# Patient Record
Sex: Female | Born: 1955 | Race: White | Hispanic: No | Marital: Single | Smoking: Former smoker
Health system: Southern US, Community
[De-identification: ages and names within clinical notes are randomized; demographics above are authoritative.]

## PROBLEM LIST (undated history)

## (undated) DIAGNOSIS — J449 Chronic obstructive pulmonary disease, unspecified: Secondary | ICD-10-CM

## (undated) DIAGNOSIS — J189 Pneumonia, unspecified organism: Secondary | ICD-10-CM

## (undated) DIAGNOSIS — M1990 Unspecified osteoarthritis, unspecified site: Secondary | ICD-10-CM

## (undated) DIAGNOSIS — E785 Hyperlipidemia, unspecified: Secondary | ICD-10-CM

## (undated) DIAGNOSIS — K7460 Unspecified cirrhosis of liver: Secondary | ICD-10-CM

## (undated) DIAGNOSIS — E119 Type 2 diabetes mellitus without complications: Secondary | ICD-10-CM

## (undated) DIAGNOSIS — C3491 Malignant neoplasm of unspecified part of right bronchus or lung: Secondary | ICD-10-CM

## (undated) DIAGNOSIS — Z9289 Personal history of other medical treatment: Secondary | ICD-10-CM

## (undated) DIAGNOSIS — Z8719 Personal history of other diseases of the digestive system: Secondary | ICD-10-CM

## (undated) DIAGNOSIS — J45909 Unspecified asthma, uncomplicated: Secondary | ICD-10-CM

## (undated) DIAGNOSIS — R0600 Dyspnea, unspecified: Secondary | ICD-10-CM

## (undated) DIAGNOSIS — I8500 Esophageal varices without bleeding: Secondary | ICD-10-CM

## (undated) DIAGNOSIS — I10 Essential (primary) hypertension: Secondary | ICD-10-CM

## (undated) HISTORY — PX: ESOPHAGOGASTRODUODENOSCOPY: SHX1529

## (undated) HISTORY — DX: Type 2 diabetes mellitus without complications: E11.9

## (undated) HISTORY — PX: ANTERIOR CRUCIATE LIGAMENT REPAIR: SHX115

## (undated) HISTORY — DX: Hyperlipidemia, unspecified: E78.5

## (undated) HISTORY — PX: ABDOMINAL HYSTERECTOMY: SHX81

---
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
## 2015-01-19 ENCOUNTER — Ambulatory Visit (INDEPENDENT_AMBULATORY_CARE_PROVIDER_SITE_OTHER): Payer: Self-pay | Admitting: Family Medicine

## 2015-01-19 ENCOUNTER — Encounter: Payer: Self-pay | Admitting: Family Medicine

## 2015-01-19 VITALS — BP 138/72 | HR 77 | Temp 97.8°F | Resp 17 | Ht 64.0 in | Wt 166.0 lb

## 2015-01-19 DIAGNOSIS — J0100 Acute maxillary sinusitis, unspecified: Secondary | ICD-10-CM

## 2015-01-19 MED ORDER — AMOXICILLIN 500 MG PO CAPS: 1000.0000 mg | ORAL_CAPSULE | Freq: Two times a day (BID) | ORAL | Status: DC

## 2015-01-19 NOTE — Patient Instructions (Addendum)
1.  Afrin --- 2 sprays each nostril twice daily; stop using after five days. 2.  Mucinex DM-- 1 tablet twice daily for cough and congestion.  Sinusitis Sinusitis is redness, soreness, and inflammation of the paranasal sinuses. Paranasal sinuses are air pockets within the bones of your face (beneath the eyes, the middle of the forehead, or above the eyes). In healthy paranasal sinuses, mucus is able to drain out, and air is able to circulate through them by way of your nose. However, when your paranasal sinuses are inflamed, mucus and air can become trapped. This can allow bacteria and other germs to grow and cause infection. Sinusitis can develop quickly and last only a short time (acute) or continue over a long period (chronic). Sinusitis that lasts for more than 12 weeks is considered chronic.  CAUSES  Causes of sinusitis include:  Allergies.  Structural abnormalities, such as displacement of the cartilage that separates your nostrils (deviated septum), which can decrease the air flow through your nose and sinuses and affect sinus drainage.  Functional abnormalities, such as when the small hairs (cilia) that line your sinuses and help remove mucus do not work properly or are not present. SIGNS AND SYMPTOMS  Symptoms of acute and chronic sinusitis are the same. The primary symptoms are pain and pressure around the affected sinuses. Other symptoms include:  Upper toothache.  Earache.  Headache.  Bad breath.  Decreased sense of smell and taste.  A cough, which worsens when you are lying flat.  Fatigue.  Fever.  Thick drainage from your nose, which often is green and may contain pus (purulent).  Swelling and warmth over the affected sinuses. DIAGNOSIS  Your health care provider will perform a physical exam. During the exam, your health care provider may:  Look in your nose for signs of abnormal growths in your nostrils (nasal polyps).  Tap over the affected sinus to check for  signs of infection.  View the inside of your sinuses (endoscopy) using an imaging device that has a light attached (endoscope). If your health care provider suspects that you have chronic sinusitis, one or more of the following tests may be recommended:  Allergy tests.  Nasal culture. A sample of mucus is taken from your nose, sent to a lab, and screened for bacteria.  Nasal cytology. A sample of mucus is taken from your nose and examined by your health care provider to determine if your sinusitis is related to an allergy. TREATMENT  Most cases of acute sinusitis are related to a viral infection and will resolve on their own within 10 days. Sometimes medicines are prescribed to help relieve symptoms (pain medicine, decongestants, nasal steroid sprays, or saline sprays).  However, for sinusitis related to a bacterial infection, your health care provider will prescribe antibiotic medicines. These are medicines that will help kill the bacteria causing the infection.  Rarely, sinusitis is caused by a fungal infection. In theses cases, your health care provider will prescribe antifungal medicine. For some cases of chronic sinusitis, surgery is needed. Generally, these are cases in which sinusitis recurs more than 3 times per year, despite other treatments. HOME CARE INSTRUCTIONS   Drink plenty of water. Water helps thin the mucus so your sinuses can drain more easily.  Use a humidifier.  Inhale steam 3 to 4 times a day (for example, sit in the bathroom with the shower running).  Apply a warm, moist washcloth to your face 3 to 4 times a day, or as directed by  your health care provider.  Use saline nasal sprays to help moisten and clean your sinuses.  Take medicines only as directed by your health care provider.  If you were prescribed either an antibiotic or antifungal medicine, finish it all even if you start to feel better. SEEK IMMEDIATE MEDICAL CARE IF:  You have increasing pain or  severe headaches.  You have nausea, vomiting, or drowsiness.  You have swelling around your face.  You have vision problems.  You have a stiff neck.  You have difficulty breathing. MAKE SURE YOU:   Understand these instructions.  Will watch your condition.  Will get help right away if you are not doing well or get worse. Document Released: 08/08/2005 Document Revised: 12/23/2013 Document Reviewed: 08/23/2011 [facility] Patient Information 2015 [place], [place]. This information is not intended to replace advice given to you by your health care provider. Make sure you discuss any questions you have with your health care provider.

## 2015-01-19 NOTE — Progress Notes (Signed)
Patient ID: Christiane Sistare, female   DOB: 17-Aug-1956, 59 y.o.   MRN: [id number]   Subjective:  This chart was scribed for Reginia Forts, MD by [facility], medical scribe at [facility].The patient was seen in exam room 10 and the patient's care was started at 3:05 PM.   Patient ID: Lucile Shutters, female    DOB: 01-04-1956, 59 y.o.   MRN: [id number]  01/19/2015  Laryngitis; Sore Throat; and URI  HPI HPI Comments: Loreal Schuessler is a 59 y.o. female who presents to [facility] complaining of a productive cough, sore throat, bilateral ear pain, rhinorrhea, sinus congestion, headache, body aches, chills and diaphoresis. Symptoms began four days ago. She is a producing a thick green, yellow and bloody mucous from her cough and nasal discharge. 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 She has taken some tylenol for relief. Pt does no smoke or drink. She denies fever, vomiting, and diarrhea.    Pt suffers with esophageal varices; no alcohol hx.  Review of Systems  Constitutional: Positive for chills and diaphoresis. Negative for fever and fatigue.  HENT: Positive for congestion, ear pain, hearing loss, rhinorrhea, sinus pressure, sneezing, sore throat and voice change. Negative for ear discharge, postnasal drip and trouble swallowing.   Respiratory: Positive for cough. Negative for shortness of breath.   Cardiovascular: Negative for chest pain, palpitations and leg swelling.  Gastrointestinal: Negative for nausea, vomiting, abdominal pain, diarrhea and constipation.  Skin: Negative for rash.  Neurological: Positive for headaches.   History reviewed. No pertinent past medical history. History reviewed. No pertinent past surgical history. No Known Allergies History   Social History  . Marital Status: Single    Spouse Name: N/A  . Number of Children: N/A  . Years of Education: N/A    Occupational History  . Not on file.   Social History Main Topics  . Smoking status: Not on file  . Smokeless tobacco: Not on file  . Alcohol Use: Not on file  . Drug Use: Not on file  . Sexual Activity: Not on file   Other Topics Concern  . Not on file   Social History Narrative  . No narrative on file       Objective:    BP 138/72 mmHg  Pulse 77  Temp(Src) 97.8 F (36.6 C) (Oral)  Resp 17  Ht '5\' 4"'$  (1.626 m)  Wt 166 lb (75.297 kg)  BMI 28.48 kg/m2  SpO2 98% Physical Exam  Constitutional: She is oriented to person, place, and time. She appears well-developed and well-nourished. No distress.  HENT:  Head: Normocephalic and atraumatic.  Right Ear: Tympanic membrane, external ear and ear canal normal.  Left Ear: Tympanic membrane, external ear and ear canal normal.  Nose: Mucosal edema and rhinorrhea present. Right sinus exhibits no maxillary sinus tenderness and no frontal sinus tenderness. Left sinus exhibits no maxillary sinus tenderness and no frontal sinus tenderness.  Mouth/Throat: Oropharynx is clear and moist.  Sinus congestion and tenderness.  Eyes: Conjunctivae and EOM are normal. Pupils are equal, round, and reactive to light.  Neck: Normal range of motion. Neck supple. Carotid bruit is not present. No thyromegaly present.  Cardiovascular: Normal rate, regular rhythm, normal heart sounds and intact distal pulses.  Exam reveals no gallop and no friction rub.   No murmur heard. Pulmonary/Chest: Effort normal and breath sounds normal. She has  no wheezes. She has no rales.  Lymphadenopathy:    She has cervical adenopathy.  Neurological: She is alert and oriented to person, place, and time. No cranial nerve deficit.  Skin: Skin is warm and dry. No rash noted. She is not diaphoretic. No erythema. No pallor.  Psychiatric: She has a normal mood and affect. Her behavior is normal.  Vitals reviewed.  No results found for this or any previous visit.      Assessment & Plan:   1. Acute maxillary sinusitis, recurrence not specified    -New. -Rx for Amoxicillin provided. -Recommend Afrin and Mucinex DM bid. -RTC for acute worsening.   Meds ordered this encounter  Medications  . omeprazole (PRILOSEC) 10 MG capsule    Sig: Take 10 mg by mouth daily.  . nadolol (CORGARD) 20 MG tablet    Sig: Take 20 mg by mouth daily.  Marland Kitchen amoxicillin (AMOXIL) 500 MG capsule    Sig: Take 2 capsules (1,000 mg total) by mouth 2 (two) times daily.    Dispense:  40 capsule    Refill:  0    No Follow-up on file.  I personally performed the services described in this documentation, which was scribed in my presence. The recorded information has been reviewed and considered.  Griffyn Kucinski Elayne Guerin, M.D. [facility] [address] [place], [place]  [address] [phone] phone [phone] fax

## 2015-03-31 ENCOUNTER — Other Ambulatory Visit: Payer: Self-pay | Admitting: Nurse Practitioner

## 2015-03-31 ENCOUNTER — Emergency Department (HOSPITAL_COMMUNITY)
Admission: EM | Admit: 2015-03-31 | Discharge: 2015-03-31 | Disposition: A | Discharge status: Home / Self Care | Payer: Medicaid Other | Source: Home / Self Care | Attending: Emergency Medicine | Admitting: Emergency Medicine

## 2015-03-31 ENCOUNTER — Encounter (HOSPITAL_COMMUNITY): Payer: Self-pay | Admitting: Emergency Medicine

## 2015-03-31 DIAGNOSIS — C220 Liver cell carcinoma: Secondary | ICD-10-CM

## 2015-03-31 DIAGNOSIS — M79631 Pain in right forearm: Secondary | ICD-10-CM | POA: Diagnosis not present

## 2015-03-31 HISTORY — DX: Essential (primary) hypertension: I10

## 2015-03-31 HISTORY — DX: Unspecified cirrhosis of liver: K74.60

## 2015-03-31 HISTORY — DX: Esophageal varices without bleeding: I85.00

## 2015-03-31 MED ORDER — GABAPENTIN 300 MG PO CAPS: 300.0000 mg | ORAL_CAPSULE | Freq: Every day | ORAL | Status: DC

## 2015-03-31 NOTE — ED Notes (Signed)
C/o right arm/wrist pain onset 6 months... Pain is getting worse Denies inj/trauma Alert... No acute distress.

## 2015-03-31 NOTE — ED Provider Notes (Signed)
CSN: [id number]     Arrival date & time 03/31/15  1437 History   First MD Initiated Contact with Patient 03/31/15 1629     Chief Complaint  Patient presents with  . Arm Pain   (Consider location/radiation/quality/duration/timing/severity/associated sxs/prior Treatment) HPI She is a 59 year old woman here for evaluation of right forearm pain. She states this started about 6 months ago and has gradually been getting worse. This started after a prolonged hospitalization. She states at one point she was intubated and had restraints on her arms. She also reports significant bruising after discharge from multiple IVs and blood draws. She reports a poorly defined aching pain in the right forearm. She is unable to pinpoint an exact location. Pain gets significantly worse with rapid supination movements. She is unable to carry heavy objects in that right hand. She requires assistance getting pan out of the oven. She denies any pain with wrist extension and flexion or elbow extension and flexion. She denies any specific injury. She has not noticed any swelling or bruising. She is unable to take NSAIDs due to stomach issues. Narcotic medications make her vomit. She has been taking Tylenol occasionally (does have liver disease) which does temporarily improved the pain.  She has also used ice and heat which again temporarily improve the pain.  Past Medical History  Diagnosis Date  . Esophageal varices   . Cirrhosis   . Hypertension    Past Surgical History  Procedure Laterality Date  . Anterior cruciate ligament repair    . Abdominal hysterectomy     No family history on file. History  Substance Use Topics  . Smoking status: Never Smoker   . Smokeless tobacco: Not on file  . Alcohol Use: No   OB History    No data available     Review of Systems As in history of present illness Allergies  Review of patient's allergies indicates no known allergies.  Home Medications   Prior to Admission  medications   Medication Sig Start Date End Date Taking? Authorizing Provider  nadolol (CORGARD) 20 MG tablet Take 20 mg by mouth daily.   Yes Historical Provider, MD  omeprazole (PRILOSEC) 10 MG capsule Take 10 mg by mouth daily.   Yes Historical Provider, MD  amoxicillin (AMOXIL) 500 MG capsule Take 2 capsules (1,000 mg total) by mouth 2 (two) times daily. 01/19/15   Wardell Honour, MD  gabapentin (NEURONTIN) 300 MG capsule Take 1 capsule (300 mg total) by mouth at bedtime. 03/31/15   Melony Overly, MD   BP 189/75 mmHg  Pulse 67  Temp(Src) 97.8 F (36.6 C) (Oral)  Resp 16  SpO2 100% Physical Exam  Constitutional: She is oriented to person, place, and time. She appears well-developed and well-nourished. No distress.  Cardiovascular: Normal rate.   Pulmonary/Chest: Effort normal.  Musculoskeletal:  Right arm: 2+ radial pulse. Mild swelling on the volar proximal forearm just distal to the medial epicondyle. She has full active range of motion. No point tenderness. She is able to supinate her hand slowly with minimal discomfort. No pain with grip. She has good grip strength without pain.  Neurological: She is alert and oriented to person, place, and time.    ED Course  Procedures (including critical care time) Labs Review Labs Reviewed - No data to display  Imaging Review No results found.   MDM   1. Right forearm pain    Unclear etiology, but appears to involve supinator muscles. We'll do a trial of  gabapentin to try and help with the pain as we are limited with other pain medicines. Recommended follow-up at the sports medicine center for additional evaluation.    Melony Overly, MD 03/31/15 [phone]

## 2015-03-31 NOTE — Discharge Instructions (Signed)
I don't have a great answer for you today, but it seems like you're having trouble with your supinator muscles.  These are the muscles that help you turn your hand over. Try taking gabapentin at bedtime for the next 2 weeks to see if that will help the pain. Continue to use Tylenol judiciously given your liver disease. Alternate ice and heat to the area. Please make an appointment at the sports medicine center for additional evaluation.

## 2015-04-02 NOTE — ED Notes (Signed)
Patient called, requesting we schedule her appoint for her [place]. Spoke w scheduling staff and had earliest appointment of 8-26 @ 9 AM. Patient was called and advised to arrive earlier to complete paperwork

## 2015-04-08 ENCOUNTER — Ambulatory Visit: Payer: Self-pay | Admitting: Physician Assistant

## 2015-04-09 ENCOUNTER — Ambulatory Visit
Admission: RE | Admit: 2015-04-09 | Discharge: 2015-04-09 | Disposition: A | Discharge status: Home / Self Care | Payer: Medicaid Other | Source: Ambulatory Visit | Attending: Nurse Practitioner | Admitting: Nurse Practitioner

## 2015-04-09 DIAGNOSIS — C220 Liver cell carcinoma: Secondary | ICD-10-CM

## 2015-04-17 ENCOUNTER — Ambulatory Visit (INDEPENDENT_AMBULATORY_CARE_PROVIDER_SITE_OTHER): Payer: Medicaid Other | Admitting: Family Medicine

## 2015-04-17 ENCOUNTER — Encounter: Payer: Self-pay | Admitting: Family Medicine

## 2015-04-17 VITALS — BP 154/72 | Ht 64.0 in | Wt 175.0 lb

## 2015-04-17 DIAGNOSIS — M654 Radial styloid tenosynovitis [de Quervain]: Secondary | ICD-10-CM | POA: Diagnosis not present

## 2015-04-17 DIAGNOSIS — M25531 Pain in right wrist: Secondary | ICD-10-CM

## 2015-04-17 MED ORDER — METHYLPREDNISOLONE ACETATE 40 MG/ML IJ SUSP
40.0000 mg | Freq: Once | INTRAMUSCULAR | Status: AC
  Administered 2015-04-17: 40 mg via INTRA_ARTICULAR

## 2015-04-17 NOTE — Progress Notes (Signed)
Patient ID: Sandy [name], female   DOB: 1956-04-01, 59 y.o.   MRN: [id number] [facility]: Attending Note: I have reviewed the chart, discussed wit the Sports Medicine Fellow. I agree with assessment and treatment plan as detailed in the [place] note. Will try CSI

## 2015-04-17 NOTE — Progress Notes (Signed)
  Sandy [name] - 59 y.o. female MRN [id number]  Date of birth: May 07, 1956 Sandy [name] is a 59 y.o. female who presents today for  right wrist pain.   Right wrist pain, initial visit-patient presents today for ongoing right dorsal wrist pain. This is located in the lateral aspect of the dorsal wrist. She is right hand dominant and this is been ongoing now for 6-8 months. She denies inciting event but does note that she has a new grandchild that she has been carrying around. Pain is worse with any type of thumb extension or flexion. It does not radiate into her fingers and she denies any paresthesias. She has not injured this wrist before. She is unable to take NSAIDs due to underlying psoriasis. She has been taking Tylenol 2-3 g with some relief. Rest does improve this.  PMHx - Updated and reviewed.  Contributory factors include: Psoriasis PSHx - Updated and reviewed.  Contributory factors include:  Anterior cruciate ligament repair FHx - Updated and reviewed.  Contributory factors include:  Noncontributory Medications - Neurontin when necessary   ROS Per HPI   Exam:  Filed Vitals:   04/17/15 0833  BP: 154/72   Gen: NAD Cardiorespiratory - Normal respiratory effort/rate.  RRR Wrist: Inspection normal with no visible erythema or swelling. ROM smooth and normal with good flexion and extension and ulnar/radial deviation that is symmetrical with opposite wrist. Palpation is normal over metacarpals, navicular, lunate, and TFCC; tendons without tenderness/ swelling Strength 5/5 in all directions without pain. Positive Finkelstein, Negative tinel's and phalens.  Imaging:  Ultrasound and long and short axis shows tenosynovitis  of the first compartment of the dorsal wrist consistent with de Quervain's. There is anechoic fluid surrounding both tendon sheaths in the first compartment that is focally tender, anechoic, and hyperemic with Doppler.

## 2015-04-17 NOTE — Assessment & Plan Note (Signed)
Patient history and physical consistent with de Quervain's tenosynovium right is of the right dorsal wrist. Differential also includes intersection syndrome as well as a possible ganglion. -Ultrasound does show anechoic fluid surrounding the first compartment tendons. She has a positive Finkelstein's test as well. -Per her report she cannot take NSAIDs due to her underlying psoriasis or she will continue on Tylenol when necessary for pain. -Did recommend a thumb spica splint with interphalangeal joint movement but she was not interested in this today. We did go ahead and perform a 1-1 injection of the first dorsal compartment under ultrasound guidance. She will follow-up in approximately 4-6 weeks to see how she is doing unless she is 100% better.   Aspiration/Injection Procedure Note Sandy [name] [birth year]  Procedure: Injection Indications: Pain   Procedure Details Consent: Risks of procedure as well as the alternatives and risks of each were explained to the (patient/caregiver).  Consent for procedure obtained. Time Out: Verified patient identification, verified procedure, site/side was marked, verified correct patient position, special equipment/implants available, medications/allergies/relevent history reviewed, required imaging and test results available.  Performed.  The area was cleaned with iodine and alcohol swabs.    The R dorsal 1st wrist compartment was injected using 1 cc's of 40 Depomedrol and 1 cc's of 1% lidocaine with a 25 1 1/2" needle.  Ultrasound was used. Images were obtained in Transverse and Long views showing the injection.    A sterile dressing was applied.  Patient did tolerate procedure well. Estimated blood loss: N/A

## 2015-04-21 ENCOUNTER — Telehealth: Payer: Self-pay | Admitting: Gastroenterology

## 2015-04-21 NOTE — Telephone Encounter (Signed)
Received all GI records and placed on Dr. Deatra Ina desk for review.

## 2015-05-08 NOTE — Telephone Encounter (Signed)
Dr. Hilarie Fredrickson declined to accept patient as well.

## 2015-05-08 NOTE — Telephone Encounter (Signed)
Dr. Deatra Ina denied to accept patient. Records placed on Dr. Vena Rua desk for review.

## 2015-06-05 ENCOUNTER — Ambulatory Visit: Payer: Self-pay | Admitting: Gastroenterology

## 2015-09-03 ENCOUNTER — Other Ambulatory Visit: Payer: Self-pay | Admitting: Family Medicine

## 2015-09-03 DIAGNOSIS — Z1231 Encounter for screening mammogram for malignant neoplasm of breast: Secondary | ICD-10-CM

## 2015-09-11 ENCOUNTER — Ambulatory Visit (INDEPENDENT_AMBULATORY_CARE_PROVIDER_SITE_OTHER): Payer: Medicaid Other | Admitting: Family Medicine

## 2015-09-11 ENCOUNTER — Encounter: Payer: Self-pay | Admitting: Family Medicine

## 2015-09-11 VITALS — BP 120/86 | Ht 64.0 in | Wt 184.0 lb

## 2015-09-11 DIAGNOSIS — M25531 Pain in right wrist: Secondary | ICD-10-CM | POA: Diagnosis present

## 2015-09-11 DIAGNOSIS — M79644 Pain in right finger(s): Secondary | ICD-10-CM | POA: Diagnosis not present

## 2015-09-11 MED ORDER — METHYLPREDNISOLONE ACETATE 40 MG/ML IJ SUSP
40.0000 mg | Freq: Once | INTRAMUSCULAR | Status: AC
  Administered 2015-09-11: 40 mg via INTRA_ARTICULAR

## 2015-09-11 NOTE — Progress Notes (Signed)
   Subjective:    Patient ID: Sandy [name], female    DOB: [birth year], 60 y.o.   MRN: [id number]  HPI  follow-up right DQuervains  Tendinitis. At last office visit which was several months ago she had an injection. She's had 90% resolution of her pain up until about the last 3 weeks which started to return. She's also noticed some pain in her thumb joint , also the right side. She is right-hand dominant. In the last 3 weeks her pain has returned almost to the level it was initially area she's having to modify her daily activities   Review of Systems  she's noted no redness or erythema or swelling of the right hand wrist or forearm. She's had no fever, sweats, chills.    Objective:   Physical Exam  vital signs are reviewed GEN.: Well-developed female no acute distress FOREARM: right. Mildly tender to palpation over the thumb tendon pathway. She also has some tenderness to  Visit at the [employer] and CMP joints of the thumb on the right. There is no swelling or erythema.  INJECTION: Patient was given informed consent, signed copy in the chart. Appropriate time out was taken. Area prepped and draped in usual sterile fashion.  1 cc of methylprednisolone 40 mg/ml plus   1 cc of 1% lidocaine without epinephrine was injected into the  First compartment of the right wrist/forearm using a(n)  Distal proximal approach. The patient tolerated the procedure well. There were no complications. Post procedure instructions were given.        Assessment & Plan:   #1. DeQuervain's tenosynovitis current. I'll suspect there some component of CMC and/or CMP degenerative joint disease. We'll go ahead and give her injection in the tendon sheath today. We'll have her be a little more rigorous with the home exercise program. I don't want to continue to have to inject this every 3 or 4 months. I would also like her to get some wrist and thumb films and I will contact her with results of those.

## 2015-09-14 ENCOUNTER — Ambulatory Visit
Admission: RE | Admit: 2015-09-14 | Discharge: 2015-09-14 | Disposition: A | Discharge status: Home / Self Care | Payer: Medicaid Other | Source: Ambulatory Visit | Attending: Family Medicine | Admitting: Family Medicine

## 2015-09-14 DIAGNOSIS — M79644 Pain in right finger(s): Secondary | ICD-10-CM

## 2015-09-14 DIAGNOSIS — Z1231 Encounter for screening mammogram for malignant neoplasm of breast: Secondary | ICD-10-CM

## 2015-09-14 DIAGNOSIS — M25531 Pain in right wrist: Secondary | ICD-10-CM

## 2015-09-28 ENCOUNTER — Encounter: Payer: Self-pay | Admitting: Family Medicine

## 2017-06-02 ENCOUNTER — Other Ambulatory Visit: Payer: Self-pay | Admitting: Nurse Practitioner

## 2017-06-02 DIAGNOSIS — K7030 Alcoholic cirrhosis of liver without ascites: Secondary | ICD-10-CM

## 2017-08-25 ENCOUNTER — Ambulatory Visit
Admission: RE | Admit: 2017-08-25 | Discharge: 2017-08-25 | Disposition: A | Discharge status: Home / Self Care | Payer: Self-pay | Source: Ambulatory Visit | Attending: Nurse Practitioner | Admitting: Nurse Practitioner

## 2017-08-25 DIAGNOSIS — K7030 Alcoholic cirrhosis of liver without ascites: Secondary | ICD-10-CM

## 2017-10-18 DIAGNOSIS — Z8719 Personal history of other diseases of the digestive system: Secondary | ICD-10-CM | POA: Insufficient documentation

## 2017-10-18 DIAGNOSIS — K7030 Alcoholic cirrhosis of liver without ascites: Secondary | ICD-10-CM | POA: Insufficient documentation

## 2018-06-16 ENCOUNTER — Other Ambulatory Visit: Payer: Self-pay | Admitting: Nurse Practitioner

## 2018-06-16 DIAGNOSIS — Z122 Encounter for screening for malignant neoplasm of respiratory organs: Secondary | ICD-10-CM

## 2018-07-05 ENCOUNTER — Other Ambulatory Visit: Payer: Self-pay | Admitting: Nurse Practitioner

## 2018-07-05 DIAGNOSIS — K7030 Alcoholic cirrhosis of liver without ascites: Secondary | ICD-10-CM

## 2018-07-10 ENCOUNTER — Other Ambulatory Visit: Payer: Self-pay | Admitting: Nurse Practitioner

## 2018-07-10 DIAGNOSIS — Z122 Encounter for screening for malignant neoplasm of respiratory organs: Secondary | ICD-10-CM

## 2018-07-23 ENCOUNTER — Ambulatory Visit: Payer: Medicare Other

## 2018-07-23 ENCOUNTER — Other Ambulatory Visit: Payer: Medicare Other

## 2018-07-27 ENCOUNTER — Ambulatory Visit
Admission: RE | Admit: 2018-07-27 | Discharge: 2018-07-27 | Disposition: A | Discharge status: Home / Self Care | Payer: Medicare Other | Source: Ambulatory Visit | Attending: Nurse Practitioner | Admitting: Nurse Practitioner

## 2018-07-27 DIAGNOSIS — Z122 Encounter for screening for malignant neoplasm of respiratory organs: Secondary | ICD-10-CM

## 2018-07-27 DIAGNOSIS — K7030 Alcoholic cirrhosis of liver without ascites: Secondary | ICD-10-CM

## 2018-08-13 ENCOUNTER — Other Ambulatory Visit: Payer: Self-pay | Admitting: Family Medicine

## 2018-08-13 DIAGNOSIS — R911 Solitary pulmonary nodule: Secondary | ICD-10-CM

## 2018-08-22 DIAGNOSIS — C3491 Malignant neoplasm of unspecified part of right bronchus or lung: Secondary | ICD-10-CM

## 2018-08-22 HISTORY — DX: Malignant neoplasm of unspecified part of right bronchus or lung: C34.91

## 2018-10-23 ENCOUNTER — Ambulatory Visit
Admission: RE | Admit: 2018-10-23 | Discharge: 2018-10-23 | Disposition: A | Discharge status: Home / Self Care | Payer: Medicare Other | Source: Ambulatory Visit | Attending: Family Medicine | Admitting: Family Medicine

## 2018-10-23 DIAGNOSIS — R911 Solitary pulmonary nodule: Secondary | ICD-10-CM

## 2018-11-27 ENCOUNTER — Institutional Professional Consult (permissible substitution): Payer: Medicare Other | Admitting: Pulmonary Disease

## 2018-12-03 ENCOUNTER — Encounter: Payer: Self-pay | Admitting: Internal Medicine

## 2018-12-03 ENCOUNTER — Ambulatory Visit (INDEPENDENT_AMBULATORY_CARE_PROVIDER_SITE_OTHER): Payer: Medicare Other | Admitting: Internal Medicine

## 2018-12-03 ENCOUNTER — Other Ambulatory Visit: Payer: Self-pay

## 2018-12-03 VITALS — BP 144/82 | HR 78 | Temp 98.2°F | Ht 64.0 in | Wt 186.8 lb

## 2018-12-03 DIAGNOSIS — R911 Solitary pulmonary nodule: Secondary | ICD-10-CM

## 2018-12-03 NOTE — Patient Instructions (Signed)
Please see patient coordinator before you leave today  to schedule PET scan and I will call you with the results and decide with you what the next step should be

## 2018-12-03 NOTE — Progress Notes (Signed)
Sandy [name], female    DOB: 1955-08-31,  MRN: [id number]   Brief patient profile:  2 yowf quit smoking 2015 for bleeding es varices which have not recurred but gained from baseline 120lb and peaked at 186 assoc with doe x fall 2019 referred to pulmonary clinic 12/03/2018 by Dr   Sandy [name] for abn LDSCT     History of Present Illness  12/03/2018  Pulmonary/ 1st office eval/Sandy [name]  Chief Complaint  Patient presents with  . Pulmonary Consult    Referred by Sandy Ar, NP. Pt c/o SOB x 6 months- gets winded walking short distances or even at rest.   Dyspnea:  Groceries from car to house/ mailbox is flat and does fine, some hills on walk her regular walk x 10 min s stopping = MMRC1 = can walk nl pace, flat grade, can't hurry or go uphills or steps s sob   Cough: first thing in am x 30 min assoc with nasal congestion/ white mucus nothing bloody ever  Sleep: on side / bed is horizontal / 1 pillow no symptoms SABA use: proair, no change p rx   No obvious day to day or daytime variability or assoc excess/ purulent sputum or mucus plugs or hemoptysis or cp or chest tightness, subjective wheeze or overt sinus or hb symptoms.   Sleeping as above  without nocturnal  or early am exacerbation  of respiratory  c/o's or need for noct saba. Also denies any obvious fluctuation of symptoms with weather or environmental changes or other aggravating or alleviating factors except as outlined above   No unusual exposure hx or h/o childhood pna/ asthma or knowledge of premature birth.  Current Allergies, Complete Past Medical History, Past Surgical History, Family History, and Social History were reviewed in [employer] record.  ROS  The following are not active complaints unless bolded Hoarseness, sore throat, dysphagia, dental problems, itching, sneezing,  nasal congestion or discharge of excess mucus or purulent secretions, ear ache,   fever, chills, sweats, unintended wt  loss or wt gain, classically pleuritic or exertional cp,  orthopnea pnd or arm/hand swelling  or leg swelling, presyncope, palpitations, abdominal pain, anorexia, nausea, vomiting, diarrhea  or change in bowel habits or change in bladder habits, change in stools or change in urine, dysuria, hematuria,  rash, arthralgias, visual complaints, headache, numbness, weakness or ataxia or problems with walking or coordination,  change in mood or  memory.             Past Medical History:  Diagnosis Date  . Cirrhosis ([place])   . Esophageal varices ([place])   . Hypertension     Outpatient Medications Prior to Visit  Medication Sig Dispense Refill  . atorvastatin (LIPITOR) 10 MG tablet Take 10 mg by mouth daily.    Marland Kitchen losartan (COZAAR) 25 MG tablet Take 25 mg by mouth daily.    Marland Kitchen METFORMIN HCL PO Take 1 tablet by mouth 2 (two) times daily.    . nadolol (CORGARD) 20 MG tablet Take 20 mg by mouth daily.    Marland Kitchen omeprazole (PRILOSEC) 20 MG capsule Take 20 mg by mouth daily.     Marland Kitchen amoxicillin (AMOXIL) 500 MG capsule Take 2 capsules (1,000 mg total) by mouth 2 (two) times daily. 40 capsule 0  . gabapentin (NEURONTIN) 300 MG capsule Take 1 capsule (300 mg total) by mouth at bedtime. 30 capsule 0  . nadolol (CORGARD) 20 MG tablet Take 10 mg by mouth.    Marland Kitchen  omeprazole (PRILOSEC) 10 MG capsule Take 10 mg by mouth daily.        Objective:     BP (!) 144/82 (BP Location: Left Arm, Cuff Size: Normal)   Pulse 78   Temp 98.2 F (36.8 C) (Oral)   Ht 5\' 4"  (1.626 m)   Wt 186 lb 12.8 oz (84.7 kg)   SpO2 98%   BMI 32.06 kg/m   SpO2: 98 %   RA   amb mildly obese wf nad  HEENT: nl dentition, turbinates bilaterally, and oropharynx. Nl external ear canals without cough reflex   NECK :  without JVD/Nodes/TM/ nl carotid upstrokes bilaterally   LUNGS: no acc muscle use,  Nl contour chest which is clear to A and P bilaterally without cough on insp or exp maneuvers   CV:  RRR  no s3 or murmur or increase in  P2, and no edema   ABD:  soft and nontender with nl inspiratory excursion in the supine position. No bruits or organomegaly appreciated, bowel sounds nl  MS:  Nl gait/ ext warm without deformities, calf tenderness, cyanosis or clubbing No obvious joint restrictions   SKIN: warm and dry without lesions    NEURO:  alert, approp, nl sensorium with  no motor or cerebellar deficits apparent.      I personally reviewed images and agree with radiology impression as follows:   Chest CT  LDSCT:  10/23/2018 Lung-RADS 4B, suspicious. Interval growth of spiculated 29.4 mm superior segment right lower lobe pulmonary nodule, suspicious for primary bronchogenic carcinoma.     Assessment   Solitary pulmonary nodule on lung CT Quit smoking 2015  Detected  LDSCT 07/27/18  Sup Seg RLL 26.8 mm increased to 29.4 3/32020 with spiculations  - 12/03/2018   Walked RA  2 laps @  approx 265ft each @ fast pace  stopped due to some sob at end but no desats    This is resectable bronchogenic ca until proven otherwise and I suspect she could be cured with sup segmentectomy if it is cancer but needs PET and pfts and surgical opinion.    We are not able to schedule PFT's at present in office until  COVID - 19 restrictions have been lifted but will proceed with PET and T surgery eval at this point.   Discussed in detail all the  indications, usual  risks and alternatives  relative to the benefits with patient who agrees to proceed with w/u as outlined.        Total time devoted to counseling  > 50 % of initial 60 min office visit:  review case with pt/ directly observed portions of ambulatory 02 sat study/ discussion of options/alternatives/ personally creating written customized instructions  in presence of pt  then going over those specific  Instructions directly with the pt including how to use all of the meds but in particular covering each new medication in detail and the difference between the maintenance=  "automatic" meds and the prns using an action plan format for the latter (If this problem/symptom => do that organization reading Left to right).  Please see AVS from this visit for a full list of these instructions which I personally wrote for this pt and  are unique to this visit.      Christinia Gully, MD 12/03/2018

## 2018-12-04 ENCOUNTER — Encounter: Payer: Self-pay | Admitting: Internal Medicine

## 2018-12-04 NOTE — Assessment & Plan Note (Signed)
Quit smoking 2015  Detected  LDSCT 07/27/18  Sup Seg RLL 26.8 mm increased to 29.4 3/32020 with spiculations  - 12/03/2018   Walked RA  2 laps @  approx 246ft each @ fast pace  stopped due to some sob at end but no desats    This is resectable bronchogenic ca until proven otherwise and I suspect she could be cured with sup segmentectomy if it is cancer but needs PET and pfts and surgical opinion.    We are not able to schedule PFT's at present in office until  COVID - 19 restrictions have been lifted but will proceed with PET and T surgery eval at this point.   Discussed in detail all the  indications, usual  risks and alternatives  relative to the benefits with patient who agrees to proceed with w/u as outlined.       Total time devoted to counseling  > 50 % of initial 60 min office visit:  review case with pt/ directly observed portions of ambulatory 02 sat study/ discussion of options/alternatives/ personally creating written customized instructions  in presence of pt  then going over those specific  Instructions directly with the pt including how to use all of the meds but in particular covering each new medication in detail and the difference between the maintenance= "automatic" meds and the prns using an action plan format for the latter (If this problem/symptom => do that organization reading Left to right).  Please see AVS from this visit for a full list of these instructions which I personally wrote for this pt and  are unique to this visit.

## 2018-12-07 ENCOUNTER — Other Ambulatory Visit: Payer: Self-pay

## 2018-12-07 ENCOUNTER — Encounter (HOSPITAL_COMMUNITY)
Admission: RE | Admit: 2018-12-07 | Discharge: 2018-12-07 | Disposition: A | Discharge status: Home / Self Care | Payer: Medicare Other | Source: Ambulatory Visit | Attending: Internal Medicine | Admitting: Internal Medicine

## 2018-12-07 DIAGNOSIS — I700 Atherosclerosis of aorta: Secondary | ICD-10-CM | POA: Diagnosis not present

## 2018-12-07 DIAGNOSIS — J439 Emphysema, unspecified: Secondary | ICD-10-CM | POA: Insufficient documentation

## 2018-12-07 DIAGNOSIS — K760 Fatty (change of) liver, not elsewhere classified: Secondary | ICD-10-CM | POA: Diagnosis not present

## 2018-12-07 DIAGNOSIS — K7460 Unspecified cirrhosis of liver: Secondary | ICD-10-CM | POA: Diagnosis not present

## 2018-12-07 DIAGNOSIS — R911 Solitary pulmonary nodule: Secondary | ICD-10-CM | POA: Insufficient documentation

## 2018-12-07 DIAGNOSIS — I2510 Atherosclerotic heart disease of native coronary artery without angina pectoris: Secondary | ICD-10-CM | POA: Insufficient documentation

## 2018-12-07 LAB — GLUCOSE, CAPILLARY: Glucose-Capillary: 93 mg/dL (ref 70–99)

## 2018-12-07 MED ORDER — FLUDEOXYGLUCOSE F - 18 (FDG) INJECTION
9.2900 | Freq: Once | INTRAVENOUS | Status: AC | PRN
  Administered 2018-12-07: 9.29 via INTRAVENOUS

## 2018-12-13 ENCOUNTER — Telehealth: Payer: Self-pay | Admitting: Internal Medicine

## 2018-12-13 DIAGNOSIS — R911 Solitary pulmonary nodule: Secondary | ICD-10-CM

## 2018-12-13 NOTE — Telephone Encounter (Signed)
Call returned to patient, made aware of MW recommendations:  Notes recorded by Tanda Rockers, MD on 12/09/2018 at 6:51 AM EDT Call patient : There is a small tumor in her R lung that is isolated to and best to remove it but also will need urology to eval her R kidney and T surgery consultation and while these are both non-urgent we need to proceed despite covid-19 restrictions.  Voiced understanding. Made aware I would place referral to urology for kidney evaluation for surgical clearance for thoracic surgery for right lung nodule. Referral for urology placed.   MW please advise if you would like for me to go ahead and place referral to Thoracic surgery or do you want to wait on urology to clear her first?

## 2018-12-13 NOTE — Telephone Encounter (Signed)
Referral has been placed for thoracic surgery. Nothing further needed.

## 2018-12-13 NOTE — Telephone Encounter (Signed)
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

## 2018-12-18 ENCOUNTER — Other Ambulatory Visit: Payer: Self-pay

## 2018-12-18 ENCOUNTER — Institutional Professional Consult (permissible substitution) (INDEPENDENT_AMBULATORY_CARE_PROVIDER_SITE_OTHER): Payer: Medicare Other | Admitting: Thoracic Surgery (Cardiothoracic Vascular Surgery)

## 2018-12-18 ENCOUNTER — Other Ambulatory Visit: Payer: Self-pay | Admitting: *Deleted

## 2018-12-18 ENCOUNTER — Encounter: Payer: Self-pay | Admitting: Thoracic Surgery (Cardiothoracic Vascular Surgery)

## 2018-12-18 VITALS — BP 134/76 | HR 76 | Temp 97.9°F | Resp 16 | Ht 64.0 in | Wt 185.0 lb

## 2018-12-18 DIAGNOSIS — R911 Solitary pulmonary nodule: Secondary | ICD-10-CM

## 2018-12-18 DIAGNOSIS — D381 Neoplasm of uncertain behavior of trachea, bronchus and lung: Secondary | ICD-10-CM | POA: Diagnosis not present

## 2018-12-18 DIAGNOSIS — E119 Type 2 diabetes mellitus without complications: Secondary | ICD-10-CM | POA: Insufficient documentation

## 2018-12-18 NOTE — Progress Notes (Signed)
PCP is [name], MD Referring Provider is Tanda Rockers, MD  Chief Complaint  Patient presents with  . Lung Lesion    RLLobe per ct chest 10/23/18/PET 12/07/18    HPI: Sandy [name] is sent for consultation regarding a right lower lobe lung mass.  Sandy [name] is a 63 year old woman with a history of tobacco abuse, type 2 diabetes without complication, cirrhosis, esophageal varices, hypertension, dyslipidemia, and arthritis.  Back in December she had a low-dose CT scan for lung cancer screening.  She was found to have a right lower lobe lung nodule.  A repeat CT in 3 months was recommended.  That showed an increase in size of the nodule.  She was referred to Dr. Melvyn Novas.  PET/CT showed the mass was hypermetabolic.  There was no hilar or mediastinal adenopathy.  There also was a lesion in the left kidney.  She has been referred to urology but has not yet seen them.  She smoked about a pack of cigarettes daily for 37 years before quitting in 2015 after being treated for bleeding esophageal varices.  She denies change in appetite or weight loss.  She does sometimes get palpitations at night and feels short of breath associated with that.  She can walk from her house to the mailbox without any difficulty.  She regularly walks about 10 minutes at a time without stopping.  She does get some shortness of breath with walking up an incline.  No unusual headaches or visual changes.  She quit ethanol 2015 after her variceal bleed.   Past Medical History:  Diagnosis Date  . Cirrhosis ([place])   . Dyslipidemia   . Esophageal varices ([place])   . Hypertension   . Type 2 diabetes mellitus ([place])     Past Surgical History:  Procedure Laterality Date  . ABDOMINAL HYSTERECTOMY    . ANTERIOR CRUCIATE LIGAMENT REPAIR      Family History  Problem Relation Age of Onset  . Lung disease Neg Hx     Social History Social History   Tobacco Use  . Smoking status: Former Smoker    Packs/day: 1.00     Years: 37.00    Pack years: 37.00    Last attempt to quit: 08/22/2013    Years since quitting: 5.3  . Smokeless tobacco: Never Used  Substance Use Topics  . Alcohol use: No  . Drug use: No    Current Outpatient Medications  Medication Sig Dispense Refill  . atorvastatin (LIPITOR) 10 MG tablet Take 10 mg by mouth daily.    Marland Kitchen losartan (COZAAR) 25 MG tablet Take 25 mg by mouth daily.    Marland Kitchen METFORMIN HCL PO Take 500 mg by mouth 2 (two) times daily.     . nadolol (CORGARD) 20 MG tablet Take 40 mg by mouth daily.     Marland Kitchen omeprazole (PRILOSEC) 20 MG capsule Take 20 mg by mouth daily.      No current facility-administered medications for this visit.     Allergies  Allergen Reactions  . Codeine Camsylate [Codeine] Nausea And Vomiting    Review of Systems  Constitutional: Negative for activity change, appetite change, fatigue and unexpected weight change.  HENT: Positive for nosebleeds. Negative for trouble swallowing and voice change.   Eyes: Negative for visual disturbance.  Respiratory: Positive for cough (Usually in the morning) and shortness of breath (With exertion). Negative for wheezing.   Cardiovascular: Positive for palpitations. Negative for chest pain and leg swelling.  Gastrointestinal: Negative  for abdominal distention and abdominal pain.  Genitourinary: Negative for difficulty urinating and dysuria.  Musculoskeletal: Positive for arthralgias, back pain and joint swelling.  Skin:       Itching  Neurological: Negative for seizures, syncope and weakness.  Hematological: Negative for adenopathy. Bruises/bleeds easily.    BP 134/76 (BP Location: Right Arm, Patient Position: Sitting, Cuff Size: Large)   Pulse 76   Temp 97.9 F (36.6 C)   Resp 16   Ht 5\' 4"  (1.626 m)   Wt 185 lb (83.9 kg)   SpO2 96% Comment: ON RA  BMI 31.76 kg/m  Physical Exam Vitals signs reviewed.  Constitutional:      General: She is not in acute distress.    Appearance: Normal appearance. She is  obese.  HENT:     Head: Normocephalic and atraumatic.  Eyes:     General: No scleral icterus.    Extraocular Movements: Extraocular movements intact.     Conjunctiva/sclera: Conjunctivae normal.  Neck:     Musculoskeletal: Neck supple.  Cardiovascular:     Rate and Rhythm: Normal rate and regular rhythm.     Heart sounds: Normal heart sounds. No murmur. No friction rub. No gallop.   Pulmonary:     Effort: Pulmonary effort is normal. No respiratory distress.     Breath sounds: Normal breath sounds. No wheezing or rales.  Abdominal:     General: There is no distension.     Palpations: Abdomen is soft.     Tenderness: There is no abdominal tenderness.  Musculoskeletal:        General: No swelling.  Lymphadenopathy:     Cervical: No cervical adenopathy.  Skin:    General: Skin is warm and dry.     Coloration: Skin is not jaundiced.  Neurological:     General: No focal deficit present.     Mental Status: She is alert and oriented to person, place, and time.     Cranial Nerves: No cranial nerve deficit.     Motor: No weakness.     Coordination: Coordination normal.    Diagnostic Tests: CT CHEST WITHOUT CONTRAST  TECHNIQUE: Multidetector CT imaging of the chest was performed following the standard protocol without IV contrast.  COMPARISON:  07/27/2018 screening chest CT.  FINDINGS: Cardiovascular: Normal heart size. No significant pericardial effusion/thickening. Left anterior descending and right coronary atherosclerosis. Atherosclerotic nonaneurysmal thoracic aorta. Normal caliber pulmonary arteries.  Mediastinum/Nodes: No discrete thyroid nodules. Unremarkable esophagus. No pathologically enlarged axillary, mediastinal or hilar lymph nodes, noting limited sensitivity for the detection of hilar adenopathy on this noncontrast study.  Lungs/Pleura: No pneumothorax. No pleural effusion. Moderate centrilobular and paraseptal emphysema with diffuse bronchial  wall thickening. No interval consolidative airspace disease. Spiculated predominantly solid posterior superior segment right lower lobe pulmonary nodule measures 29.4 mm in volume derived mean diameter (series 3/image 112), increased from 26.8 mm.  Upper abdomen: Small hiatal hernia. Diffusely irregular liver surface compatible with hepatic cirrhosis. Simple bilateral upper renal cysts, largest 3.8 cm on the right. Exophytic hyperdense 1.8 cm posterior upper left renal cortical lesion (series 5/image 56), increased from 1.1 cm on 03/10/2007 CT abdomen study.  Musculoskeletal: No aggressive appearing focal osseous lesions. Moderate thoracic spondylosis.  IMPRESSION: 1. Lung-RADS 4B, suspicious. Interval growth of spiculated 29.4 mm superior segment right lower lobe pulmonary nodule, suspicious for primary bronchogenic carcinoma. Additional imaging evaluation or consultation with Pulmonology or Thoracic Surgery recommended. 2. Two vessel coronary atherosclerosis. 3. Indeterminate 1.8 cm hyperdense renal cortical lesion  in the posterior upper left kidney, mildly increased in size since 2008 CT study. The slow growth probably indicates a benign hemorrhagic/proteinaceous renal cyst. This lesion may be characterized with renal mass protocol MRI (preferred) or CT abdomen without and with IV contrast. 4. Morphologic changes of hepatic cirrhosis.  Aortic Atherosclerosis (ICD10-I70.0) and Emphysema (ICD10-J43.9).  These results will be called to the ordering clinician or representative by the Radiologist Assistant, and communication documented in the PACS or zVision Dashboard.   Electronically Signed   By: Sandy [name] M.D.   On: 10/31/2018 14:41 NUCLEAR MEDICINE PET SKULL BASE TO THIGH  TECHNIQUE: 9.3 mCi F-18 FDG was injected intravenously. Full-ring PET imaging was performed from the skull base to thigh after the radiotracer. CT data was obtained and used for attenuation  correction and anatomic localization.  Fasting blood glucose: 93 mg/dl  COMPARISON:  Chest CTs, most recent 10/23/2018  FINDINGS: Mediastinal blood pool activity: SUV max 2.6  NECK: No areas of abnormal hypermetabolism.  Incidental CT findings: Bilateral carotid atherosclerosis. No cervical adenopathy.  CHEST: Hypermetabolism corresponding to the superior segment right lower lobe lung mass. This measures 3.5 x 2.4 cm and a S.U.V. max of 7.6 on image 38/8. No thoracic nodal hypermetabolism.  Incidental CT findings: Tiny hiatal hernia. Aortic and coronary artery atherosclerosis, including within the LAD. Moderate centrilobular emphysema.  ABDOMEN/PELVIS: No abdominopelvic parenchymal or nodal hypermetabolism.  Incidental CT findings: Normal adrenal glands. Low-density right renal lesions which are likely cysts. Complex left renal lesions, including up to 1.2 cm on image 115/4.  Advanced cirrhosis and hepatic steatosis. Abdominal aortic atherosclerosis. Hysterectomy.  SKELETON: No abnormal marrow activity.  Incidental CT findings: none  IMPRESSION: 1. Hypermetabolic superior segment right lower lobe lung mass, consistent with primary bronchogenic carcinoma. Presuming non-small-cell histology, most consistent with T2aN0M0 or stage IB. 2. Cirrhosis and hepatic steatosis 3. Indeterminate left renal lesions which could represent complex cysts or solid neoplasms. Consider dedicated pre and post contrast abdominal MRI. 4. Age advanced coronary artery atherosclerosis. Recommend assessment of coronary risk factors and consideration of medical therapy. 5. Aortic atherosclerosis (ICD10-I70.0) and emphysema (ICD10-J43.9).   Electronically Signed   By: Sandy [name] M.D.   On: 12/07/2018 15:11 Personally reviewed the CT and PET/CT images and concur with the findings noted above.  Impression: Sandy [name] is a 63 year old woman with a history of tobacco abuse,  COPD, cirrhosis, esophageal variceal bleeding, ethanol use (quit 2015), hypertension, hyperlipidemia, arthritis, and type 2 diabetes mellitus without complication.  Because of her smoking history she had a low-dose CT scan for lung cancer screening in December 2019.  It showed a 2.7 cm nodule in the superior segment of the right lower lobe.  A follow-up CT in 3 months showed interval increase in size.  PET/CT showed the nodule was hypermetabolic with a maximum SUV of 7.6.  The nodule measured 3.5 x 2.4 cm.  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  Infectious and inflammatory nodules are also within the differential, but given her age, smoking history, and appearance of the nodule primary bronchogenic carcinoma is far and away the most likely etiology.  I discussed the CT and PET/CT findings and reviewed the films with Ms. Garmany.  We discussed potential diagnostic options including bronchoscopy or CT-guided biopsy versus wedge resection.  We also discussed potential treatment options including surgery, radiation, and chemotherapy.  Given the high likelihood that this is lung cancer, I recommended that we proceed with right VATS for  wedge resection and possible right lower lobectomy.  I described the general nature of the procedure to her including the need for general anesthesia, the incisions to be used, the use of a drainage tube postoperatively, the expected hospital stay, and the overall recovery.  I informed her of the indications, risk, benefits, and alternatives.  She understands the risks include, but not limited to death, MI, DVT, PE, bleeding, possible need for transfusion, infection, prolonged air leak, cardiac arrhythmias, lymphatic leaks, as well as the possibility of other unforeseeable complications.  She understands accepts the risk and wishes to proceed.  Left kidney lesion seen on CT and PET-she is had a complex lesion on her left kidney dating back to a  CT of her abdomen in 2008.  She has an appointment with [facility] on May 8.  We will see what urology thinks about the renal lesion.  Hopefully it will not need any intervention.  If it does, I would think the lung mass would take precedence in terms of order of treatment.  Tobacco abuse-quit 2015  COPD-she does have some physical limitations.  She does not have pulmonary function testing.  I will try to arrange for that but I am unsure if we will be able to get that done in the current [place] environment.  She does understand that lobectomy will decrease her pulmonary reserve.  Coronary calcification seen on CT-no anginal symptoms.  Plan: Pulmonary function testing with and without bronchodilators Neurology consultation Right VATS for wedge resection and possible right lower lobectomy on Monday, 01/07/2019.  Melrose Nakayama, MD [facility] [phone]

## 2018-12-18 NOTE — H&P (View-Only) (Signed)
PCP is [name], MD Referring Provider is Tanda Rockers, MD  Chief Complaint  Patient presents with  . Lung Lesion    RLLobe per ct chest 10/23/18/PET 12/07/18    HPI: Sandy [name] is sent for consultation regarding a right lower lobe lung mass.  Sandy [name] is a 63 year old woman with a history of tobacco abuse, type 2 diabetes without complication, cirrhosis, esophageal varices, hypertension, dyslipidemia, and arthritis.  Back in December she had a low-dose CT scan for lung cancer screening.  She was found to have a right lower lobe lung nodule.  A repeat CT in 3 months was recommended.  That showed an increase in size of the nodule.  She was referred to Dr. Melvyn Novas.  PET/CT showed the mass was hypermetabolic.  There was no hilar or mediastinal adenopathy.  There also was a lesion in the left kidney.  She has been referred to urology but has not yet seen them.  She smoked about a pack of cigarettes daily for 37 years before quitting in 2015 after being treated for bleeding esophageal varices.  She denies change in appetite or weight loss.  She does sometimes get palpitations at night and feels short of breath associated with that.  She can walk from her house to the mailbox without any difficulty.  She regularly walks about 10 minutes at a time without stopping.  She does get some shortness of breath with walking up an incline.  No unusual headaches or visual changes.  She quit ethanol 2015 after her variceal bleed.   Past Medical History:  Diagnosis Date  . Cirrhosis ([place])   . Dyslipidemia   . Esophageal varices ([place])   . Hypertension   . Type 2 diabetes mellitus ([place])     Past Surgical History:  Procedure Laterality Date  . ABDOMINAL HYSTERECTOMY    . ANTERIOR CRUCIATE LIGAMENT REPAIR      Family History  Problem Relation Age of Onset  . Lung disease Neg Hx     Social History Social History   Tobacco Use  . Smoking status: Former Smoker    Packs/day: 1.00     Years: 37.00    Pack years: 37.00    Last attempt to quit: 08/22/2013    Years since quitting: 5.3  . Smokeless tobacco: Never Used  Substance Use Topics  . Alcohol use: No  . Drug use: No    Current Outpatient Medications  Medication Sig Dispense Refill  . atorvastatin (LIPITOR) 10 MG tablet Take 10 mg by mouth daily.    Marland Kitchen losartan (COZAAR) 25 MG tablet Take 25 mg by mouth daily.    Marland Kitchen METFORMIN HCL PO Take 500 mg by mouth 2 (two) times daily.     . nadolol (CORGARD) 20 MG tablet Take 40 mg by mouth daily.     Marland Kitchen omeprazole (PRILOSEC) 20 MG capsule Take 20 mg by mouth daily.      No current facility-administered medications for this visit.     Allergies  Allergen Reactions  . Codeine Camsylate [Codeine] Nausea And Vomiting    Review of Systems  Constitutional: Negative for activity change, appetite change, fatigue and unexpected weight change.  HENT: Positive for nosebleeds. Negative for trouble swallowing and voice change.   Eyes: Negative for visual disturbance.  Respiratory: Positive for cough (Usually in the morning) and shortness of breath (With exertion). Negative for wheezing.   Cardiovascular: Positive for palpitations. Negative for chest pain and leg swelling.  Gastrointestinal: Negative  for abdominal distention and abdominal pain.  Genitourinary: Negative for difficulty urinating and dysuria.  Musculoskeletal: Positive for arthralgias, back pain and joint swelling.  Skin:       Itching  Neurological: Negative for seizures, syncope and weakness.  Hematological: Negative for adenopathy. Bruises/bleeds easily.    BP 134/76 (BP Location: Right Arm, Patient Position: Sitting, Cuff Size: Large)   Pulse 76   Temp 97.9 F (36.6 C)   Resp 16   Ht 5\' 4"  (1.626 m)   Wt 185 lb (83.9 kg)   SpO2 96% Comment: ON RA  BMI 31.76 kg/m  Physical Exam Vitals signs reviewed.  Constitutional:      General: She is not in acute distress.    Appearance: Normal appearance. She is  obese.  HENT:     Head: Normocephalic and atraumatic.  Eyes:     General: No scleral icterus.    Extraocular Movements: Extraocular movements intact.     Conjunctiva/sclera: Conjunctivae normal.  Neck:     Musculoskeletal: Neck supple.  Cardiovascular:     Rate and Rhythm: Normal rate and regular rhythm.     Heart sounds: Normal heart sounds. No murmur. No friction rub. No gallop.   Pulmonary:     Effort: Pulmonary effort is normal. No respiratory distress.     Breath sounds: Normal breath sounds. No wheezing or rales.  Abdominal:     General: There is no distension.     Palpations: Abdomen is soft.     Tenderness: There is no abdominal tenderness.  Musculoskeletal:        General: No swelling.  Lymphadenopathy:     Cervical: No cervical adenopathy.  Skin:    General: Skin is warm and dry.     Coloration: Skin is not jaundiced.  Neurological:     General: No focal deficit present.     Mental Status: She is alert and oriented to person, place, and time.     Cranial Nerves: No cranial nerve deficit.     Motor: No weakness.     Coordination: Coordination normal.    Diagnostic Tests: CT CHEST WITHOUT CONTRAST  TECHNIQUE: Multidetector CT imaging of the chest was performed following the standard protocol without IV contrast.  COMPARISON:  07/27/2018 screening chest CT.  FINDINGS: Cardiovascular: Normal heart size. No significant pericardial effusion/thickening. Left anterior descending and right coronary atherosclerosis. Atherosclerotic nonaneurysmal thoracic aorta. Normal caliber pulmonary arteries.  Mediastinum/Nodes: No discrete thyroid nodules. Unremarkable esophagus. No pathologically enlarged axillary, mediastinal or hilar lymph nodes, noting limited sensitivity for the detection of hilar adenopathy on this noncontrast study.  Lungs/Pleura: No pneumothorax. No pleural effusion. Moderate centrilobular and paraseptal emphysema with diffuse bronchial  wall thickening. No interval consolidative airspace disease. Spiculated predominantly solid posterior superior segment right lower lobe pulmonary nodule measures 29.4 mm in volume derived mean diameter (series 3/image 112), increased from 26.8 mm.  Upper abdomen: Small hiatal hernia. Diffusely irregular liver surface compatible with hepatic cirrhosis. Simple bilateral upper renal cysts, largest 3.8 cm on the right. Exophytic hyperdense 1.8 cm posterior upper left renal cortical lesion (series 5/image 56), increased from 1.1 cm on 03/10/2007 CT abdomen study.  Musculoskeletal: No aggressive appearing focal osseous lesions. Moderate thoracic spondylosis.  IMPRESSION: 1. Lung-RADS 4B, suspicious. Interval growth of spiculated 29.4 mm superior segment right lower lobe pulmonary nodule, suspicious for primary bronchogenic carcinoma. Additional imaging evaluation or consultation with Pulmonology or Thoracic Surgery recommended. 2. Two vessel coronary atherosclerosis. 3. Indeterminate 1.8 cm hyperdense renal cortical lesion  in the posterior upper left kidney, mildly increased in size since 2008 CT study. The slow growth probably indicates a benign hemorrhagic/proteinaceous renal cyst. This lesion may be characterized with renal mass protocol MRI (preferred) or CT abdomen without and with IV contrast. 4. Morphologic changes of hepatic cirrhosis.  Aortic Atherosclerosis (ICD10-I70.0) and Emphysema (ICD10-J43.9).  These results will be called to the ordering clinician or representative by the Radiologist Assistant, and communication documented in the PACS or zVision Dashboard.   Electronically Signed   By: Ilona Sorrel M.D.   On: 10/31/2018 14:41 NUCLEAR MEDICINE PET SKULL BASE TO THIGH  TECHNIQUE: 9.3 mCi F-18 FDG was injected intravenously. Full-ring PET imaging was performed from the skull base to thigh after the radiotracer. CT data was obtained and used for attenuation  correction and anatomic localization.  Fasting blood glucose: 93 mg/dl  COMPARISON:  Chest CTs, most recent 10/23/2018  FINDINGS: Mediastinal blood pool activity: SUV max 2.6  NECK: No areas of abnormal hypermetabolism.  Incidental CT findings: Bilateral carotid atherosclerosis. No cervical adenopathy.  CHEST: Hypermetabolism corresponding to the superior segment right lower lobe lung mass. This measures 3.5 x 2.4 cm and a S.U.V. max of 7.6 on image 38/8. No thoracic nodal hypermetabolism.  Incidental CT findings: Tiny hiatal hernia. Aortic and coronary artery atherosclerosis, including within the LAD. Moderate centrilobular emphysema.  ABDOMEN/PELVIS: No abdominopelvic parenchymal or nodal hypermetabolism.  Incidental CT findings: Normal adrenal glands. Low-density right renal lesions which are likely cysts. Complex left renal lesions, including up to 1.2 cm on image 115/4.  Advanced cirrhosis and hepatic steatosis. Abdominal aortic atherosclerosis. Hysterectomy.  SKELETON: No abnormal marrow activity.  Incidental CT findings: none  IMPRESSION: 1. Hypermetabolic superior segment right lower lobe lung mass, consistent with primary bronchogenic carcinoma. Presuming non-small-cell histology, most consistent with T2aN0M0 or stage IB. 2. Cirrhosis and hepatic steatosis 3. Indeterminate left renal lesions which could represent complex cysts or solid neoplasms. Consider dedicated pre and post contrast abdominal MRI. 4. Age advanced coronary artery atherosclerosis. Recommend assessment of coronary risk factors and consideration of medical therapy. 5. Aortic atherosclerosis (ICD10-I70.0) and emphysema (ICD10-J43.9).   Electronically Signed   By: Abigail Miyamoto M.D.   On: 12/07/2018 15:11 Personally reviewed the CT and PET/CT images and concur with the findings noted above.  Impression: Sandy [name] is a 63 year old woman with a history of tobacco abuse,  COPD, cirrhosis, esophageal variceal bleeding, ethanol use (quit 2015), hypertension, hyperlipidemia, arthritis, and type 2 diabetes mellitus without complication.  Because of her smoking history she had a low-dose CT scan for lung cancer screening in December 2019.  It showed a 2.7 cm nodule in the superior segment of the right lower lobe.  A follow-up CT in 3 months showed interval increase in size.  PET/CT showed the nodule was hypermetabolic with a maximum SUV of 7.6.  The nodule measured 3.5 x 2.4 cm.  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  Infectious and inflammatory nodules are also within the differential, but given her age, smoking history, and appearance of the nodule primary bronchogenic carcinoma is far and away the most likely etiology.  I discussed the CT and PET/CT findings and reviewed the films with Sandy [name].  We discussed potential diagnostic options including bronchoscopy or CT-guided biopsy versus wedge resection.  We also discussed potential treatment options including surgery, radiation, and chemotherapy.  Given the high likelihood that this is lung cancer, I recommended that we proceed with right VATS for  wedge resection and possible right lower lobectomy.  I described the general nature of the procedure to her including the need for general anesthesia, the incisions to be used, the use of a drainage tube postoperatively, the expected hospital stay, and the overall recovery.  I informed her of the indications, risk, benefits, and alternatives.  She understands the risks include, but not limited to death, MI, DVT, PE, bleeding, possible need for transfusion, infection, prolonged air leak, cardiac arrhythmias, lymphatic leaks, as well as the possibility of other unforeseeable complications.  She understands accepts the risk and wishes to proceed.  Left kidney lesion seen on CT and PET-she is had a complex lesion on her left kidney dating back to a  CT of her abdomen in 2008.  [facility] has an appointment with [facility] on May [facility].  We will see what urology thinks about the renal lesion.  Hopefully it will not need any intervention.  If it does, I would think the lung mass would take precedence in terms of order of treatment.  Tobacco abuse-quit 2015  COPD-she does have some physical limitations.  She does not have pulmonary function testing.  I will try to arrange for that but I am unsure if we will be able to get that done in the current [place] environment.  She does understand that lobectomy will decrease her pulmonary reserve.  Coronary calcification seen on CT-no anginal symptoms.  Plan: Pulmonary function testing with and without bronchodilators Neurology consultation Right VATS for wedge resection and possible right lower lobectomy on Monday, 01/07/2019.  Melrose Nakayama, MD [facility] [phone]

## 2018-12-19 ENCOUNTER — Other Ambulatory Visit: Payer: Self-pay | Admitting: *Deleted

## 2018-12-19 ENCOUNTER — Encounter: Payer: Self-pay | Admitting: *Deleted

## 2018-12-19 DIAGNOSIS — J984 Other disorders of lung: Secondary | ICD-10-CM

## 2018-12-28 ENCOUNTER — Other Ambulatory Visit: Payer: Self-pay | Admitting: Urology

## 2018-12-28 DIAGNOSIS — D4102 Neoplasm of uncertain behavior of left kidney: Secondary | ICD-10-CM

## 2018-12-31 NOTE — Pre-Procedure Instructions (Addendum)
Sandy [name]  12/31/2018     Your procedure is scheduled on Monday, May 18.  Report to [facility], Main Entrance or Entrance "A" at 5:30 AM                Your surgery or procedure is scheduled for  7:30  A.M.   Call this number if you have problems the morning of surgery: [phone]  This is the number for the Pre- Surgical Desk.   Remember:  Do not eat or drink after midnight Sunday, May 17.     Take these medicines the morning of surgery with A SIP OF WATER:  nadolol (CORGARD) -takes at night omeprazole (PRILOSEC) takes at night  If needed: acetaminophen (TYLENOL) Eye Drops   [place]- Preparing For Surgery  Before surgery, you can play an important role. Because skin is not sterile, your skin needs to be as free of germs as possible. You can reduce the number of germs on your skin by washing with CHG (chlorahexidine gluconate) Soap before surgery.  CHG is an antiseptic cleaner which kills germs and bonds with the skin to continue killing germs even after washing.    Oral Hygiene is also important to reduce your risk of infection.  Remember - BRUSH YOUR TEETH THE MORNING OF SURGERY WITH YOUR REGULAR TOOTHPASTE  Please do not use if you have an allergy to CHG or antibacterial soaps. If your skin becomes reddened/irritated stop using the CHG.  Do not shave (including legs and underarms) for at least 48 hours prior to first CHG shower. It is OK to shave your face.  Please follow these instructions carefully.   1. Shower the NIGHT BEFORE SURGERY and the MORNING OF SURGERY with CHG.   2. If you chose to wash your hair, wash your hair first as usual with your normal shampoo.  3. After you shampoo, wash your face and private area with the soap you use at home, then rinse your hair and body thoroughly to remove the shampoo and soap.  4. Use CHG as you would any other liquid soap. You can apply CHG directly to the skin and wash gently with a scrungie or a clean  washcloth.   5. Apply the CHG Soap to your body ONLY FROM THE NECK DOWN.  Do not use on open wounds or open sores. Avoid contact with your eyes, ears, mouth and genitals (private parts).   6. Wash thoroughly, paying special attention to the area where your surgery will be performed.  7. Thoroughly rinse your body with warm water from the neck down.  8. DO NOT shower/wash with your normal soap after using and rinsing off the CHG Soap.  9. Pat yourself dry with a CLEAN TOWEL.  10. Wear CLEAN PAJAMAS to bed the night before surgery, wear comfortable clothes the morning of surgery  11. Place CLEAN SHEETS on your bed the night of your first shower and DO NOT SLEEP WITH PETS.  Day of Surgery: Shower as Above Do not apply any deodorants/lotions, powders or colognes.  Please wear clean clothes to the hospital/surgery center.   Remember to brush your teeth WITH YOUR REGULAR TOOTHPASTE.  Do not wear jewelry, make-up or nail polish.  Do not shave 48 hours prior to surgery.  Men may shave face and neck.  Do not bring valuables to the hospital.  [facility] is not responsible for any belongings or valuables.  Contacts, dentures or bridgework may not be worn  into surgery.  Leave your suitcase in the car.  After surgery it may be brought to your room.  For patients admitted to the hospital, discharge time will be determined by your treatment team.  Patients discharged the day of surgery will not be allowed to drive home.   Please read over the following fact sheets that you were given:  Pain Booklet, Patient Instructions for Mupirocin Application, Incentive Spirometry, Surgical Site Infections.

## 2019-01-01 ENCOUNTER — Encounter (HOSPITAL_COMMUNITY)
Admission: RE | Admit: 2019-01-01 | Discharge: 2019-01-01 | Disposition: A | Discharge status: Home / Self Care | Payer: Medicare Other | Source: Ambulatory Visit | Attending: Thoracic Surgery (Cardiothoracic Vascular Surgery) | Admitting: Thoracic Surgery (Cardiothoracic Vascular Surgery)

## 2019-01-01 ENCOUNTER — Encounter (HOSPITAL_COMMUNITY): Payer: Self-pay

## 2019-01-01 ENCOUNTER — Inpatient Hospital Stay (HOSPITAL_COMMUNITY): Admission: RE | Admit: 2019-01-01 | Payer: Medicare Other | Source: Ambulatory Visit

## 2019-01-01 ENCOUNTER — Other Ambulatory Visit: Payer: Self-pay

## 2019-01-01 DIAGNOSIS — J984 Other disorders of lung: Secondary | ICD-10-CM

## 2019-01-01 DIAGNOSIS — Z01818 Encounter for other preprocedural examination: Secondary | ICD-10-CM | POA: Diagnosis present

## 2019-01-01 DIAGNOSIS — I10 Essential (primary) hypertension: Secondary | ICD-10-CM | POA: Diagnosis not present

## 2019-01-01 HISTORY — DX: Personal history of other diseases of the digestive system: Z87.19

## 2019-01-01 HISTORY — DX: Dyspnea, unspecified: R06.00

## 2019-01-01 HISTORY — DX: Personal history of other medical treatment: Z92.89

## 2019-01-01 HISTORY — DX: Unspecified osteoarthritis, unspecified site: M19.90

## 2019-01-01 HISTORY — DX: Pneumonia, unspecified organism: J18.9

## 2019-01-01 HISTORY — DX: Chronic obstructive pulmonary disease, unspecified: J44.9

## 2019-01-01 LAB — TYPE AND SCREEN
ABO/RH(D): O POS
Antibody Screen: NEGATIVE

## 2019-01-01 LAB — CBC
HCT: 40.3 % (ref 36.0–46.0)
Hemoglobin: 11.9 g/dL — ABNORMAL LOW (ref 12.0–15.0)
MCH: 24 pg — ABNORMAL LOW (ref 26.0–34.0)
MCHC: 29.5 g/dL — ABNORMAL LOW (ref 30.0–36.0)
MCV: 81.3 fL (ref 80.0–100.0)
Platelets: 202 10*3/uL (ref 150–400)
RBC: 4.96 MIL/uL (ref 3.87–5.11)
RDW: 15.9 % — ABNORMAL HIGH (ref 11.5–15.5)
WBC: 4.4 10*3/uL (ref 4.0–10.5)
nRBC: 0 % (ref 0.0–0.2)

## 2019-01-01 LAB — URINALYSIS, ROUTINE W REFLEX MICROSCOPIC
Bacteria, UA: NONE SEEN
Bilirubin Urine: NEGATIVE
Glucose, UA: NEGATIVE mg/dL
Hgb urine dipstick: NEGATIVE
Ketones, ur: NEGATIVE mg/dL
Leukocytes,Ua: NEGATIVE
Nitrite: NEGATIVE
Protein, ur: 30 mg/dL — AB
Specific Gravity, Urine: 1.014 (ref 1.005–1.030)
pH: 7 (ref 5.0–8.0)

## 2019-01-01 LAB — PROTIME-INR
INR: 1.3 — ABNORMAL HIGH (ref 0.8–1.2)
Prothrombin Time: 16.2 seconds — ABNORMAL HIGH (ref 11.4–15.2)

## 2019-01-01 LAB — COMPREHENSIVE METABOLIC PANEL
ALT: 45 U/L — ABNORMAL HIGH (ref 0–44)
AST: 73 U/L — ABNORMAL HIGH (ref 15–41)
Albumin: 3.6 g/dL (ref 3.5–5.0)
Alkaline Phosphatase: 110 U/L (ref 38–126)
Anion gap: 8 (ref 5–15)
BUN: <5 mg/dL — ABNORMAL LOW (ref 8–23)
CO2: 22 mmol/L (ref 22–32)
Calcium: 9.5 mg/dL (ref 8.9–10.3)
Chloride: 108 mmol/L (ref 98–111)
Creatinine, Ser: 0.73 mg/dL (ref 0.44–1.00)
GFR calc Af Amer: >60 mL/min (ref >60)
GFR calc non Af Amer: >60 mL/min (ref >60)
Glucose, Bld: 146 mg/dL — ABNORMAL HIGH (ref 70–99)
Potassium: 4 mmol/L (ref 3.5–5.1)
Sodium: 138 mmol/L (ref 135–145)
Total Bilirubin: 1.2 mg/dL (ref 0.3–1.2)
Total Protein: 8 g/dL (ref 6.5–8.1)

## 2019-01-01 LAB — HEMOGLOBIN A1C
Hgb A1c MFr Bld: 6 % — ABNORMAL HIGH (ref 4.8–5.6)
Mean Plasma Glucose: 125.5 mg/dL

## 2019-01-01 LAB — APTT: aPTT: 38 seconds — ABNORMAL HIGH (ref 24–36)

## 2019-01-01 LAB — SURGICAL PCR SCREEN
MRSA, PCR: NEGATIVE
Staphylococcus aureus: NEGATIVE

## 2019-01-01 LAB — ABO/RH: ABO/RH(D): O POS

## 2019-01-01 LAB — GLUCOSE, CAPILLARY: Glucose-Capillary: 122 mg/dL — ABNORMAL HIGH (ref 70–99)

## 2019-01-01 NOTE — Progress Notes (Signed)
   How to Manage Your Diabetes Before and After Surgery  Why is it important to control my blood sugar before and after surgery? . Improving blood sugar levels before and after surgery helps healing and can limit problems. . A way of improving blood sugar control is eating a healthy diet by: o  Eating less sugar and carbohydrates o  Increasing activity/exercise o  Talking with your doctor about reaching your blood sugar goals . High blood sugars (greater than 180 mg/dL) can raise your risk of infections and slow your recovery, so you will need to focus on controlling your diabetes during the weeks before surgery. . Make sure that the doctor who takes care of your diabetes knows about your planned surgery including the date and location.  How do I manage my blood sugar before surgery? . Check your blood sugar at least 4 times a day, starting 2 days before surgery, to make sure that the level is not too high or low. o Check your blood sugar the morning of your surgery when you wake up and every 2 hours until you get to the Short Stay unit. . If your blood sugar is less than 70 mg/dL, you will need to treat for low blood sugar: o Do not take insulin. o Treat a low blood sugar (less than 70 mg/dL) with  cup of clear juice (cranberry or apple), 4 glucose tablets, OR glucose gel. Recheck blood sugar in 15 minutes after treatment (to make sure it is greater than 70 mg/dL). If your blood sugar is not greater than 70 mg/dL on recheck, call [phone] o  for further instructions. . Report your blood sugar to the short stay nurse when you get to Short Stay.  . If you are admitted to the hospital after surgery: o Your blood sugar will be checked by the staff and you will probably be given insulin after surgery (instead of oral diabetes medicines) to make sure you have good blood sugar levels. o The goal for blood sugar control after surgery is 80-180 mg/dL   WHAT DO I DO ABOUT MY DIABETES  MEDICATION?   Do not take oral diabetes medicines (pills) the morning of surgery.

## 2019-01-01 NOTE — Progress Notes (Addendum)
PCP -  Dr. Welton Flakes, [place]  Cardiologist - none  Chest x-ray - DOS  EKG - 01/01/2019  Stress Test - denies  ECHO - denies Cardiac Cath - denies  Sleep Study - denies Stop Bang Score 6 - sent to Dr Welton Flakes  LABS- CBC, CMP, PT, PTT, ABG, A1c, U/A  ASA-no  ERAS-no  HA1C- Fasting Blood Sugar -  unknown Checks Blood Sugar _rarely____ times a day  Anesthesia-na  Pt denies having chest pain, sob, or fever at this time. All instructions explained to the pt, with a verbal understanding of the material. Pt agrees to go over the instructions while at home for a better understanding. The opportunity to ask questions was provided.  Patient denies that she or her family has experienced any of the following: Cough Fever >100.4 Runny Nose Sore Throat Difficulty breathing/ shortness of breath Travel in past 14 days- none  Sandy [name] is scheduled for COVID test, I asked her if she was going to be able to quarantine after test, she said no, she had lots of things to get done prior to surgery. I spoke to [facility] AD , she said patient can have a rapid on Thursday. I informed patient.

## 2019-01-03 ENCOUNTER — Other Ambulatory Visit (HOSPITAL_COMMUNITY)
Admission: RE | Admit: 2019-01-03 | Discharge: 2019-01-03 | Disposition: A | Discharge status: Home / Self Care | Payer: Medicare Other | Source: Ambulatory Visit | Attending: Thoracic Surgery (Cardiothoracic Vascular Surgery) | Admitting: Thoracic Surgery (Cardiothoracic Vascular Surgery)

## 2019-01-03 ENCOUNTER — Other Ambulatory Visit: Payer: Self-pay

## 2019-01-04 ENCOUNTER — Inpatient Hospital Stay (HOSPITAL_COMMUNITY): Admission: RE | Admit: 2019-01-04 | Payer: Medicare Other | Source: Ambulatory Visit

## 2019-01-04 ENCOUNTER — Ambulatory Visit (HOSPITAL_COMMUNITY)
Admission: RE | Admit: 2019-01-04 | Discharge: 2019-01-04 | Disposition: A | Discharge status: Home / Self Care | Payer: Medicare Other | Source: Ambulatory Visit | Attending: Thoracic Surgery (Cardiothoracic Vascular Surgery) | Admitting: Thoracic Surgery (Cardiothoracic Vascular Surgery)

## 2019-01-04 DIAGNOSIS — R911 Solitary pulmonary nodule: Secondary | ICD-10-CM

## 2019-01-04 LAB — PULMONARY FUNCTION TEST
DL/VA % pred: 63 %
DL/VA: 2.66 ml/min/mmHg/L
DLCO cor % pred: 57 %
DLCO cor: 11.45 ml/min/mmHg
DLCO unc % pred: 54 %
DLCO unc: 10.89 ml/min/mmHg
FEF 25-75 Post: 2.17 L/sec
FEF 25-75 Pre: 2.13 L/sec
FEF2575-%Change-Post: 1 %
FEF2575-%Pred-Post: 95 %
FEF2575-%Pred-Pre: 94 %
FEV1-%Change-Post: 1 %
FEV1-%Pred-Post: 89 %
FEV1-%Pred-Pre: 88 %
FEV1-Post: 2.23 L
FEV1-Pre: 2.2 L
FEV1FVC-%Change-Post: 0 %
FEV1FVC-%Pred-Pre: 101 %
FEV6-%Change-Post: 1 %
FEV6-%Pred-Post: 89 %
FEV6-%Pred-Pre: 88 %
FEV6-Post: 2.81 L
FEV6-Pre: 2.77 L
FEV6FVC-%Change-Post: -1 %
FEV6FVC-%Pred-Post: 102 %
FEV6FVC-%Pred-Pre: 103 %
FVC-%Change-Post: 1 %
FVC-%Pred-Post: 87 %
FVC-%Pred-Pre: 86 %
FVC-Post: 2.85 L
FVC-Pre: 2.8 L
Post FEV1/FVC ratio: 78 %
Post FEV6/FVC ratio: 99 %
Pre FEV1/FVC ratio: 79 %
Pre FEV6/FVC Ratio: 100 %
RV % pred: 98 %
RV: 2 L
TLC % pred: 98 %
TLC: 4.96 L

## 2019-01-04 LAB — NOVEL CORONAVIRUS, NAA (HOSP ORDER, SEND-OUT TO REF LAB; TAT 18-24 HRS): SARS-CoV-2, NAA: NOT DETECTED

## 2019-01-04 MED ORDER — ALBUTEROL SULFATE (2.5 MG/3ML) 0.083% IN NEBU
2.5000 mg | INHALATION_SOLUTION | Freq: Once | RESPIRATORY_TRACT | Status: AC
  Administered 2019-01-04: 2.5 mg via RESPIRATORY_TRACT

## 2019-01-06 NOTE — Anesthesia Preprocedure Evaluation (Addendum)
Anesthesia Evaluation  Patient identified by MRN, date of birth, ID band Patient awake    Reviewed: Allergy & Precautions, H&P , NPO status , Patient's Chart, lab work & pertinent test results, reviewed documented beta blocker date and time   Airway Mallampati: III  TM Distance: >3 FB Neck ROM: Full    Dental no notable dental hx. (+) Teeth Intact, Dental Advisory Given   Pulmonary shortness of breath, COPD, former smoker,    Pulmonary exam normal breath sounds clear to auscultation       Cardiovascular Exercise Tolerance: Good hypertension, Pt. on medications and Pt. on home beta blockers  Rhythm:Regular Rate:Normal     Neuro/Psych negative neurological ROS  negative psych ROS   GI/Hepatic Neg liver ROS, hiatal hernia,   Endo/Other  diabetes, Type 2, Oral Hypoglycemic Agents  Renal/GU negative Renal ROS  negative genitourinary   Musculoskeletal  (+) Arthritis , Osteoarthritis,    Abdominal   Peds  Hematology negative hematology ROS (+)   Anesthesia Other Findings   Reproductive/Obstetrics negative OB ROS                            Anesthesia Physical Anesthesia Plan  ASA: III  Anesthesia Plan: General   Post-op Pain Management:    Induction: Intravenous  PONV Risk Score and Plan: 4 or greater and Ondansetron, Dexamethasone and Midazolam  Airway Management Planned: Double Lumen EBT  Additional Equipment: Arterial line, CVP and Ultrasound Guidance Line Placement  Intra-op Plan:   Post-operative Plan: Extubation in OR and Possible Post-op intubation/ventilation  Informed Consent: I have reviewed the patients History and Physical, chart, labs and discussed the procedure including the risks, benefits and alternatives for the proposed anesthesia with the patient or authorized representative who has indicated his/her understanding and acceptance.     Dental advisory given  Plan  Discussed with: CRNA  Anesthesia Plan Comments:         Anesthesia Quick Evaluation

## 2019-01-07 ENCOUNTER — Inpatient Hospital Stay (HOSPITAL_COMMUNITY): Payer: Medicare Other

## 2019-01-07 ENCOUNTER — Inpatient Hospital Stay (HOSPITAL_COMMUNITY): Payer: Medicare Other | Admitting: Certified Registered Nurse Anesthetist

## 2019-01-07 ENCOUNTER — Other Ambulatory Visit: Payer: Self-pay

## 2019-01-07 ENCOUNTER — Encounter (HOSPITAL_COMMUNITY): Payer: Self-pay

## 2019-01-07 ENCOUNTER — Encounter (HOSPITAL_COMMUNITY)
Admission: RE | Disposition: A | Discharge status: Home / Self Care | Payer: Self-pay | Source: Home / Self Care | Attending: Thoracic Surgery (Cardiothoracic Vascular Surgery)

## 2019-01-07 ENCOUNTER — Inpatient Hospital Stay (HOSPITAL_COMMUNITY)
Admission: RE | Admit: 2019-01-07 | Discharge: 2019-01-10 | DRG: 164 | Disposition: A | Discharge status: Home / Self Care | Payer: Medicare Other | Attending: Thoracic Surgery (Cardiothoracic Vascular Surgery) | Admitting: Thoracic Surgery (Cardiothoracic Vascular Surgery)

## 2019-01-07 DIAGNOSIS — Z90710 Acquired absence of both cervix and uterus: Secondary | ICD-10-CM | POA: Diagnosis not present

## 2019-01-07 DIAGNOSIS — Y92239 Unspecified place in hospital as the place of occurrence of the external cause: Secondary | ICD-10-CM | POA: Diagnosis not present

## 2019-01-07 DIAGNOSIS — Z885 Allergy status to narcotic agent status: Secondary | ICD-10-CM

## 2019-01-07 DIAGNOSIS — Z79899 Other long term (current) drug therapy: Secondary | ICD-10-CM | POA: Diagnosis not present

## 2019-01-07 DIAGNOSIS — M1990 Unspecified osteoarthritis, unspecified site: Secondary | ICD-10-CM | POA: Diagnosis present

## 2019-01-07 DIAGNOSIS — T50905A Adverse effect of unspecified drugs, medicaments and biological substances, initial encounter: Secondary | ICD-10-CM | POA: Diagnosis not present

## 2019-01-07 DIAGNOSIS — F17210 Nicotine dependence, cigarettes, uncomplicated: Secondary | ICD-10-CM | POA: Diagnosis not present

## 2019-01-07 DIAGNOSIS — N281 Cyst of kidney, acquired: Secondary | ICD-10-CM | POA: Diagnosis present

## 2019-01-07 DIAGNOSIS — Z7984 Long term (current) use of oral hypoglycemic drugs: Secondary | ICD-10-CM | POA: Diagnosis not present

## 2019-01-07 DIAGNOSIS — E119 Type 2 diabetes mellitus without complications: Secondary | ICD-10-CM | POA: Diagnosis not present

## 2019-01-07 DIAGNOSIS — J432 Centrilobular emphysema: Secondary | ICD-10-CM | POA: Diagnosis present

## 2019-01-07 DIAGNOSIS — J9811 Atelectasis: Secondary | ICD-10-CM | POA: Diagnosis not present

## 2019-01-07 DIAGNOSIS — Z1159 Encounter for screening for other viral diseases: Secondary | ICD-10-CM

## 2019-01-07 DIAGNOSIS — R112 Nausea with vomiting, unspecified: Secondary | ICD-10-CM | POA: Diagnosis not present

## 2019-01-07 DIAGNOSIS — C3431 Malignant neoplasm of lower lobe, right bronchus or lung: Secondary | ICD-10-CM

## 2019-01-07 DIAGNOSIS — E785 Hyperlipidemia, unspecified: Secondary | ICD-10-CM | POA: Diagnosis present

## 2019-01-07 DIAGNOSIS — I10 Essential (primary) hypertension: Secondary | ICD-10-CM | POA: Diagnosis present

## 2019-01-07 DIAGNOSIS — K7460 Unspecified cirrhosis of liver: Secondary | ICD-10-CM | POA: Diagnosis not present

## 2019-01-07 DIAGNOSIS — R911 Solitary pulmonary nodule: Secondary | ICD-10-CM | POA: Diagnosis present

## 2019-01-07 DIAGNOSIS — J984 Other disorders of lung: Secondary | ICD-10-CM

## 2019-01-07 DIAGNOSIS — Z902 Acquired absence of lung [part of]: Secondary | ICD-10-CM

## 2019-01-07 DIAGNOSIS — Z4682 Encounter for fitting and adjustment of non-vascular catheter: Secondary | ICD-10-CM

## 2019-01-07 DIAGNOSIS — I2510 Atherosclerotic heart disease of native coronary artery without angina pectoris: Secondary | ICD-10-CM | POA: Diagnosis present

## 2019-01-07 HISTORY — PX: VIDEO ASSISTED THORACOSCOPY (VATS)/WEDGE RESECTION: SHX6174

## 2019-01-07 HISTORY — PX: LOBECTOMY: SHX5089

## 2019-01-07 LAB — GLUCOSE, CAPILLARY
Glucose-Capillary: 103 mg/dL — ABNORMAL HIGH (ref 70–99)
Glucose-Capillary: 152 mg/dL — ABNORMAL HIGH (ref 70–99)
Glucose-Capillary: 222 mg/dL — ABNORMAL HIGH (ref 70–99)
Glucose-Capillary: 263 mg/dL — ABNORMAL HIGH (ref 70–99)
Glucose-Capillary: 201 mg/dL — ABNORMAL HIGH (ref 70–99)

## 2019-01-07 SURGERY — VIDEO ASSISTED THORACOSCOPY (VATS)/WEDGE RESECTION
Anesthesia: General | Site: Chest | Laterality: Right

## 2019-01-07 MED ORDER — BUPIVACAINE LIPOSOME 1.3 % IJ SUSP
INTRAMUSCULAR | Status: DC | PRN
  Administered 2019-01-07: 50 mL

## 2019-01-07 MED ORDER — MIDAZOLAM HCL 5 MG/5ML IJ SOLN
INTRAMUSCULAR | Status: DC | PRN
  Administered 2019-01-07: 2 mg via INTRAVENOUS

## 2019-01-07 MED ORDER — EPHEDRINE 5 MG/ML INJ
INTRAVENOUS | Status: AC
  Filled 2019-01-07: qty 10

## 2019-01-07 MED ORDER — LACTATED RINGERS IV SOLN
INTRAVENOUS | Status: DC | PRN
  Administered 2019-01-07: 08:00:00 via INTRAVENOUS

## 2019-01-07 MED ORDER — ACETAMINOPHEN 500 MG PO TABS
1000.0000 mg | ORAL_TABLET | Freq: Four times a day (QID) | ORAL | Status: DC
  Administered 2019-01-07 – 2019-01-09 (×7): 1000 mg via ORAL
  Filled 2019-01-07 (×8): qty 2

## 2019-01-07 MED ORDER — EPHEDRINE SULFATE-NACL 50-0.9 MG/10ML-% IV SOSY
PREFILLED_SYRINGE | INTRAVENOUS | Status: DC | PRN
  Administered 2019-01-07 (×2): 5 mg via INTRAVENOUS

## 2019-01-07 MED ORDER — DEXAMETHASONE SODIUM PHOSPHATE 10 MG/ML IJ SOLN
INTRAMUSCULAR | Status: AC
  Filled 2019-01-07: qty 2

## 2019-01-07 MED ORDER — BISACODYL 5 MG PO TBEC
10.0000 mg | DELAYED_RELEASE_TABLET | Freq: Every day | ORAL | Status: DC
  Administered 2019-01-08 – 2019-01-09 (×2): 10 mg via ORAL
  Filled 2019-01-07 (×3): qty 2

## 2019-01-07 MED ORDER — HYDROMORPHONE HCL 1 MG/ML IJ SOLN
0.2500 mg | INTRAMUSCULAR | Status: DC | PRN
  Administered 2019-01-07: 13:00:00 0.5 mg via INTRAVENOUS

## 2019-01-07 MED ORDER — DIPHENHYDRAMINE HCL 12.5 MG/5ML PO ELIX
12.5000 mg | ORAL_SOLUTION | Freq: Four times a day (QID) | ORAL | Status: DC | PRN
  Filled 2019-01-07: qty 5

## 2019-01-07 MED ORDER — LIDOCAINE 2% (20 MG/ML) 5 ML SYRINGE
INTRAMUSCULAR | Status: DC | PRN
  Administered 2019-01-07: 60 mg via INTRAVENOUS

## 2019-01-07 MED ORDER — PROPOFOL 10 MG/ML IV BOLUS
INTRAVENOUS | Status: AC
  Filled 2019-01-07: qty 20

## 2019-01-07 MED ORDER — ROCURONIUM BROMIDE 10 MG/ML (PF) SYRINGE
PREFILLED_SYRINGE | INTRAVENOUS | Status: AC
  Filled 2019-01-07: qty 10

## 2019-01-07 MED ORDER — SODIUM CHLORIDE 0.9% FLUSH: 9.0000 mL | INTRAVENOUS | Status: DC | PRN

## 2019-01-07 MED ORDER — LEVALBUTEROL HCL 0.63 MG/3ML IN NEBU
0.6300 mg | INHALATION_SOLUTION | Freq: Four times a day (QID) | RESPIRATORY_TRACT | Status: DC
  Administered 2019-01-07 – 2019-01-08 (×5): 0.63 mg via RESPIRATORY_TRACT
  Filled 2019-01-07 (×5): qty 3

## 2019-01-07 MED ORDER — LUNG SURGERY BOOK
Freq: Once | Status: AC
  Administered 2019-01-07: 16:00:00 1
  Filled 2019-01-07: qty 1

## 2019-01-07 MED ORDER — PROPOFOL 10 MG/ML IV BOLUS
INTRAVENOUS | Status: DC | PRN
  Administered 2019-01-07: 30 mg via INTRAVENOUS
  Administered 2019-01-07: 110 mg via INTRAVENOUS

## 2019-01-07 MED ORDER — SUCCINYLCHOLINE CHLORIDE 200 MG/10ML IV SOSY
PREFILLED_SYRINGE | INTRAVENOUS | Status: DC | PRN
  Administered 2019-01-07: 80 mg via INTRAVENOUS

## 2019-01-07 MED ORDER — PNEUMOCOCCAL VAC POLYVALENT 25 MCG/0.5ML IJ INJ: 0.5000 mL | INJECTION | INTRAMUSCULAR | Status: DC

## 2019-01-07 MED ORDER — SENNOSIDES-DOCUSATE SODIUM 8.6-50 MG PO TABS
1.0000 | ORAL_TABLET | Freq: Every day | ORAL | Status: DC
  Administered 2019-01-07 – 2019-01-09 (×2): 1 via ORAL
  Filled 2019-01-07 (×3): qty 1

## 2019-01-07 MED ORDER — SODIUM CHLORIDE 0.9 % IV SOLN: INTRAVENOUS | Status: DC

## 2019-01-07 MED ORDER — ACETAMINOPHEN 160 MG/5ML PO SOLN
1000.0000 mg | Freq: Four times a day (QID) | ORAL | Status: DC
  Administered 2019-01-10: 1000 mg via ORAL
  Filled 2019-01-07: qty 40.6

## 2019-01-07 MED ORDER — ATORVASTATIN CALCIUM 10 MG PO TABS
10.0000 mg | ORAL_TABLET | Freq: Every day | ORAL | Status: DC
  Administered 2019-01-07 – 2019-01-09 (×3): 10 mg via ORAL
  Filled 2019-01-07 (×3): qty 1

## 2019-01-07 MED ORDER — LOSARTAN POTASSIUM 25 MG PO TABS
25.0000 mg | ORAL_TABLET | Freq: Every day | ORAL | Status: DC
  Administered 2019-01-07: 21:00:00 25 mg via ORAL
  Filled 2019-01-07: qty 1

## 2019-01-07 MED ORDER — OXYCODONE HCL 5 MG PO TABS: 5.0000 mg | ORAL_TABLET | ORAL | Status: DC | PRN

## 2019-01-07 MED ORDER — MIDAZOLAM HCL 2 MG/2ML IJ SOLN
INTRAMUSCULAR | Status: AC
  Filled 2019-01-07: qty 2

## 2019-01-07 MED ORDER — LACTATED RINGERS IV SOLN
INTRAVENOUS | Status: DC | PRN
  Administered 2019-01-07: 07:00:00 via INTRAVENOUS

## 2019-01-07 MED ORDER — 0.9 % SODIUM CHLORIDE (POUR BTL) OPTIME
TOPICAL | Status: DC | PRN
  Administered 2019-01-07: 07:00:00 2000 mL

## 2019-01-07 MED ORDER — TRAMADOL HCL 50 MG PO TABS
50.0000 mg | ORAL_TABLET | Freq: Four times a day (QID) | ORAL | Status: DC | PRN
  Administered 2019-01-08: 08:00:00 100 mg via ORAL
  Administered 2019-01-09: 50 mg via ORAL
  Administered 2019-01-09: 08:00:00 100 mg via ORAL
  Administered 2019-01-09 – 2019-01-10 (×3): 50 mg via ORAL
  Filled 2019-01-07: qty 2
  Filled 2019-01-07 (×2): qty 1
  Filled 2019-01-07: qty 2
  Filled 2019-01-07: qty 1
  Filled 2019-01-07: qty 2

## 2019-01-07 MED ORDER — BUPIVACAINE LIPOSOME 1.3 % IJ SUSP
20.0000 mL | INTRAMUSCULAR | Status: DC
  Filled 2019-01-07: qty 20

## 2019-01-07 MED ORDER — TETRAHYDROZOLINE HCL 0.05 % OP SOLN
1.0000 [drp] | Freq: Every day | OPHTHALMIC | Status: DC | PRN
  Filled 2019-01-07: qty 15

## 2019-01-07 MED ORDER — METOCLOPRAMIDE HCL 5 MG/ML IJ SOLN
10.0000 mg | Freq: Four times a day (QID) | INTRAMUSCULAR | Status: AC
  Administered 2019-01-07 – 2019-01-08 (×4): 10 mg via INTRAVENOUS
  Filled 2019-01-07 (×4): qty 2

## 2019-01-07 MED ORDER — NALOXONE HCL 0.4 MG/ML IJ SOLN: 0.4000 mg | INTRAMUSCULAR | Status: DC | PRN

## 2019-01-07 MED ORDER — ONDANSETRON HCL 4 MG/2ML IJ SOLN
4.0000 mg | Freq: Four times a day (QID) | INTRAMUSCULAR | Status: DC | PRN
  Administered 2019-01-07 – 2019-01-08 (×3): 4 mg via INTRAVENOUS
  Filled 2019-01-07 (×3): qty 2

## 2019-01-07 MED ORDER — PHENYLEPHRINE 40 MCG/ML (10ML) SYRINGE FOR IV PUSH (FOR BLOOD PRESSURE SUPPORT)
PREFILLED_SYRINGE | INTRAVENOUS | Status: AC
  Filled 2019-01-07: qty 10

## 2019-01-07 MED ORDER — BUPIVACAINE HCL (PF) 0.5 % IJ SOLN
INTRAMUSCULAR | Status: AC
  Filled 2019-01-07: qty 30

## 2019-01-07 MED ORDER — FENTANYL CITRATE (PF) 250 MCG/5ML IJ SOLN
INTRAMUSCULAR | Status: AC
  Filled 2019-01-07: qty 5

## 2019-01-07 MED ORDER — ORAL CARE MOUTH RINSE
15.0000 mL | Freq: Two times a day (BID) | OROMUCOSAL | Status: DC
  Administered 2019-01-07: 21:00:00 15 mL via OROMUCOSAL

## 2019-01-07 MED ORDER — ONDANSETRON HCL 4 MG/2ML IJ SOLN
INTRAMUSCULAR | Status: DC | PRN
  Administered 2019-01-07: 4 mg via INTRAVENOUS

## 2019-01-07 MED ORDER — FENTANYL CITRATE (PF) 250 MCG/5ML IJ SOLN
INTRAMUSCULAR | Status: DC | PRN
  Administered 2019-01-07: 50 ug via INTRAVENOUS
  Administered 2019-01-07: 100 ug via INTRAVENOUS
  Administered 2019-01-07: 50 ug via INTRAVENOUS
  Administered 2019-01-07 (×2): 25 ug via INTRAVENOUS

## 2019-01-07 MED ORDER — CEFAZOLIN SODIUM-DEXTROSE 2-4 GM/100ML-% IV SOLN
2.0000 g | Freq: Three times a day (TID) | INTRAVENOUS | Status: AC
  Administered 2019-01-07 (×2): 2 g via INTRAVENOUS
  Filled 2019-01-07 (×2): qty 100

## 2019-01-07 MED ORDER — ROCURONIUM BROMIDE 50 MG/5ML IV SOSY
PREFILLED_SYRINGE | INTRAVENOUS | Status: DC | PRN
  Administered 2019-01-07 (×2): 10 mg via INTRAVENOUS
  Administered 2019-01-07: 70 mg via INTRAVENOUS

## 2019-01-07 MED ORDER — SUGAMMADEX SODIUM 200 MG/2ML IV SOLN
INTRAVENOUS | Status: DC | PRN
  Administered 2019-01-07: 200 mg via INTRAVENOUS

## 2019-01-07 MED ORDER — PANTOPRAZOLE SODIUM 40 MG PO TBEC
40.0000 mg | DELAYED_RELEASE_TABLET | Freq: Every day | ORAL | Status: DC
  Administered 2019-01-07 – 2019-01-09 (×4): 40 mg via ORAL
  Filled 2019-01-07 (×4): qty 1

## 2019-01-07 MED ORDER — DIPHENHYDRAMINE HCL 25 MG PO CAPS
50.0000 mg | ORAL_CAPSULE | Freq: Every evening | ORAL | Status: DC | PRN
  Administered 2019-01-09: 50 mg via ORAL
  Filled 2019-01-07: qty 2

## 2019-01-07 MED ORDER — SODIUM CHLORIDE (PF) 0.9 % IJ SOLN
INTRAMUSCULAR | Status: DC | PRN
  Administered 2019-01-07: 50 mL via INTRAVENOUS

## 2019-01-07 MED ORDER — CEFAZOLIN SODIUM-DEXTROSE 2-4 GM/100ML-% IV SOLN
2.0000 g | INTRAVENOUS | Status: AC
  Administered 2019-01-07: 2 g via INTRAVENOUS
  Filled 2019-01-07: qty 100

## 2019-01-07 MED ORDER — DIPHENHYDRAMINE HCL 25 MG PO TABS
50.0000 mg | ORAL_TABLET | Freq: Every evening | ORAL | Status: DC | PRN
  Filled 2019-01-07: qty 2

## 2019-01-07 MED ORDER — HYDROMORPHONE HCL 1 MG/ML IJ SOLN
INTRAMUSCULAR | Status: AC
  Filled 2019-01-07: qty 1

## 2019-01-07 MED ORDER — POTASSIUM CHLORIDE 10 MEQ/50ML IV SOLN: 10.0000 meq | Freq: Every day | INTRAVENOUS | Status: DC | PRN

## 2019-01-07 MED ORDER — SODIUM CHLORIDE 0.9 % IV SOLN
INTRAVENOUS | Status: DC | PRN
  Administered 2019-01-07: 25 ug/min via INTRAVENOUS

## 2019-01-07 MED ORDER — LIDOCAINE 2% (20 MG/ML) 5 ML SYRINGE
INTRAMUSCULAR | Status: AC
  Filled 2019-01-07: qty 5

## 2019-01-07 MED ORDER — MORPHINE SULFATE 2 MG/ML IV SOLN
INTRAVENOUS | Status: DC
  Administered 2019-01-07: 7.5 mg via INTRAVENOUS
  Administered 2019-01-07: 0 mg via INTRAVENOUS
  Administered 2019-01-07: 12:00:00 via INTRAVENOUS
  Administered 2019-01-07: 6 mg via INTRAVENOUS
  Administered 2019-01-08: 3 mg via INTRAVENOUS
  Administered 2019-01-08: 4.5 mg via INTRAVENOUS
  Administered 2019-01-08: 6 mg via INTRAVENOUS
  Administered 2019-01-08: 23:00:00 via INTRAVENOUS
  Administered 2019-01-08: 3 mg via INTRAVENOUS
  Administered 2019-01-08 – 2019-01-09 (×2): 4.5 mg via INTRAVENOUS
  Filled 2019-01-07: qty 30
  Filled 2019-01-07: qty 50

## 2019-01-07 MED ORDER — INSULIN ASPART 100 UNIT/ML ~~LOC~~ SOLN
0.0000 [IU] | SUBCUTANEOUS | Status: DC
  Administered 2019-01-07: 20:00:00 12 [IU] via SUBCUTANEOUS
  Administered 2019-01-07 (×2): 8 [IU] via SUBCUTANEOUS
  Administered 2019-01-08: 09:00:00 2 [IU] via SUBCUTANEOUS

## 2019-01-07 MED ORDER — DEXAMETHASONE SODIUM PHOSPHATE 10 MG/ML IJ SOLN
INTRAMUSCULAR | Status: DC | PRN
  Administered 2019-01-07: 5 mg via INTRAVENOUS

## 2019-01-07 MED ORDER — DIPHENHYDRAMINE HCL 50 MG/ML IJ SOLN: 12.5000 mg | Freq: Four times a day (QID) | INTRAMUSCULAR | Status: DC | PRN

## 2019-01-07 MED ORDER — SUCCINYLCHOLINE CHLORIDE 200 MG/10ML IV SOSY
PREFILLED_SYRINGE | INTRAVENOUS | Status: AC
  Filled 2019-01-07: qty 10

## 2019-01-07 MED ORDER — NADOLOL 40 MG PO TABS
40.0000 mg | ORAL_TABLET | Freq: Every day | ORAL | Status: DC
  Administered 2019-01-07 – 2019-01-09 (×3): 40 mg via ORAL
  Filled 2019-01-07 (×3): qty 1

## 2019-01-07 MED ORDER — ONDANSETRON HCL 4 MG/2ML IJ SOLN
INTRAMUSCULAR | Status: AC
  Filled 2019-01-07: qty 2

## 2019-01-07 MED ORDER — ONDANSETRON HCL 4 MG/2ML IJ SOLN: 4.0000 mg | Freq: Four times a day (QID) | INTRAMUSCULAR | Status: DC | PRN

## 2019-01-07 SURGICAL SUPPLY — 96 items
ADH SKN CLS APL DERMABOND .7 (GAUZE/BANDAGES/DRESSINGS) ×1
BAG SPEC RTRVL LRG 6X4 10 (ENDOMECHANICALS) ×1
CANISTER SUCT 3000ML PPV (MISCELLANEOUS) ×3 IMPLANT
CATH THORACIC 28FR (CATHETERS) ×1 IMPLANT
CATH THORACIC 36FR (CATHETERS) IMPLANT
CATH THORACIC 36FR RT ANG (CATHETERS) IMPLANT
CLIP VESOCCLUDE MED 6/CT (CLIP) ×1 IMPLANT
CONN ST 1/4X3/8  BEN (MISCELLANEOUS)
CONN ST 1/4X3/8 BEN (MISCELLANEOUS) IMPLANT
CONN Y 3/8X3/8X3/8  BEN (MISCELLANEOUS)
CONN Y 3/8X3/8X3/8 BEN (MISCELLANEOUS) IMPLANT
CONT SPEC 4OZ CLIKSEAL STRL BL (MISCELLANEOUS) ×12 IMPLANT
COVER SURGICAL LIGHT HANDLE (MISCELLANEOUS) ×1 IMPLANT
COVER WAND RF STERILE (DRAPES) ×2 IMPLANT
DERMABOND ADVANCED (GAUZE/BANDAGES/DRESSINGS) ×1
DERMABOND ADVANCED .7 DNX12 (GAUZE/BANDAGES/DRESSINGS) ×1 IMPLANT
DRAIN CHANNEL 28F RND 3/8 FF (WOUND CARE) IMPLANT
DRAIN CHANNEL 32F RND 10.7 FF (WOUND CARE) IMPLANT
DRAPE CV SPLIT W-CLR ANES SCRN (DRAPES) ×2 IMPLANT
DRAPE ORTHO SPLIT 77X108 STRL (DRAPES) ×2
DRAPE SURG ORHT 6 SPLT 77X108 (DRAPES) ×1 IMPLANT
DRAPE WARM FLUID 44X44 (DRAPE) ×2 IMPLANT
ELECT BLADE 6.5 EXT (BLADE) ×2 IMPLANT
ELECT REM PT RETURN 9FT ADLT (ELECTROSURGICAL) ×2
ELECTRODE REM PT RTRN 9FT ADLT (ELECTROSURGICAL) ×1 IMPLANT
GAUZE SPONGE 4X4 12PLY STRL (GAUZE/BANDAGES/DRESSINGS) ×1 IMPLANT
GAUZE XEROFORM 1X8 LF (GAUZE/BANDAGES/DRESSINGS) ×1 IMPLANT
GLOVE BIO SURGEON STRL SZ 6.5 (GLOVE) ×1 IMPLANT
GLOVE BIOGEL PI IND STRL 6.5 (GLOVE) IMPLANT
GLOVE BIOGEL PI IND STRL 7.0 (GLOVE) IMPLANT
GLOVE BIOGEL PI INDICATOR 6.5 (GLOVE) ×1
GLOVE BIOGEL PI INDICATOR 7.0 (GLOVE) ×2
GLOVE SURG SIGNA 7.5 PF LTX (GLOVE) ×4 IMPLANT
GOWN STRL REUS W/ TWL LRG LVL3 (GOWN DISPOSABLE) ×2 IMPLANT
GOWN STRL REUS W/ TWL XL LVL3 (GOWN DISPOSABLE) ×2 IMPLANT
GOWN STRL REUS W/TWL LRG LVL3 (GOWN DISPOSABLE) ×4
GOWN STRL REUS W/TWL XL LVL3 (GOWN DISPOSABLE) ×4
HEMOSTAT SURGICEL 2X14 (HEMOSTASIS) IMPLANT
KIT BASIN OR (CUSTOM PROCEDURE TRAY) ×2 IMPLANT
KIT SUCTION CATH 14FR (SUCTIONS) ×2 IMPLANT
KIT TURNOVER KIT B (KITS) ×2 IMPLANT
NDL SPNL 18GX3.5 QUINCKE PK (NEEDLE) IMPLANT
NEEDLE SPNL 18GX3.5 QUINCKE PK (NEEDLE) ×2 IMPLANT
NS IRRIG 1000ML POUR BTL (IV SOLUTION) ×5 IMPLANT
PACK CHEST (CUSTOM PROCEDURE TRAY) ×2 IMPLANT
PAD ARMBOARD 7.5X6 YLW CONV (MISCELLANEOUS) ×5 IMPLANT
POUCH ENDO CATCH II 15MM (MISCELLANEOUS) ×1 IMPLANT
POUCH SPECIMEN RETRIEVAL 10MM (ENDOMECHANICALS) ×1 IMPLANT
RELOAD STAPLE 35X2.5 WHT THIN (STAPLE) IMPLANT
RELOAD STAPLE 45 4.1 GRN THCK (STAPLE) IMPLANT
RELOAD STAPLE 45 GOLD REG/THCK (STAPLE) IMPLANT
SCISSORS ENDO CVD 5DCS (MISCELLANEOUS) IMPLANT
SEALANT PROGEL (MISCELLANEOUS) IMPLANT
SEALANT SURG COSEAL 4ML (VASCULAR PRODUCTS) IMPLANT
SEALANT SURG COSEAL 8ML (VASCULAR PRODUCTS) IMPLANT
SHEARS HARMONIC HDI 20CM (ELECTROSURGICAL) ×1 IMPLANT
SOLUTION ANTI FOG 6CC (MISCELLANEOUS) ×2 IMPLANT
SPECIMEN JAR MEDIUM (MISCELLANEOUS) ×2 IMPLANT
SPONGE INTESTINAL PEANUT (DISPOSABLE) ×2 IMPLANT
SPONGE TONSIL TAPE 1 RFD (DISPOSABLE) ×2 IMPLANT
STAPLE RELOAD 2.5MM WHITE (STAPLE) ×12 IMPLANT
STAPLE RELOAD 45 GRN (STAPLE) ×1 IMPLANT
STAPLE RELOAD 45MM GOLD (STAPLE) ×24 IMPLANT
STAPLE RELOAD 45MM GREEN (STAPLE) ×2
STAPLER ECHELON POWERED (MISCELLANEOUS) ×1 IMPLANT
STAPLER VASCULAR ECHELON 35 (CUTTER) ×1 IMPLANT
SUT PROLENE 4 0 RB 1 (SUTURE)
SUT PROLENE 4-0 RB1 .5 CRCL 36 (SUTURE) IMPLANT
SUT SILK  1 MH (SUTURE) ×2
SUT SILK 1 MH (SUTURE) ×2 IMPLANT
SUT SILK 1 TIES 10X30 (SUTURE) ×2 IMPLANT
SUT SILK 2 0 SH (SUTURE) IMPLANT
SUT SILK 2 0SH CR/8 30 (SUTURE) IMPLANT
SUT SILK 3 0 SH 30 (SUTURE) IMPLANT
SUT SILK 3 0SH CR/8 30 (SUTURE) ×1 IMPLANT
SUT VIC AB 0 CTX 27 (SUTURE) IMPLANT
SUT VIC AB 1 CTX 27 (SUTURE) ×2 IMPLANT
SUT VIC AB 2-0 CT1 27 (SUTURE) ×4
SUT VIC AB 2-0 CT1 TAPERPNT 27 (SUTURE) IMPLANT
SUT VIC AB 2-0 CTX 36 (SUTURE) ×2 IMPLANT
SUT VIC AB 3-0 MH 27 (SUTURE) IMPLANT
SUT VIC AB 3-0 SH 27 (SUTURE)
SUT VIC AB 3-0 SH 27X BRD (SUTURE) IMPLANT
SUT VIC AB 3-0 X1 27 (SUTURE) ×2 IMPLANT
SUT VICRYL 0 UR6 27IN ABS (SUTURE) IMPLANT
SUT VICRYL 2 TP 1 (SUTURE) IMPLANT
SYR 30ML LL (SYRINGE) ×2 IMPLANT
SYSTEM SAHARA CHEST DRAIN ATS (WOUND CARE) ×2 IMPLANT
TAPE CLOTH SURG 4X10 WHT LF (GAUZE/BANDAGES/DRESSINGS) ×1 IMPLANT
TIP APPLICATOR SPRAY EXTEND 16 (VASCULAR PRODUCTS) IMPLANT
TOWEL GREEN STERILE (TOWEL DISPOSABLE) ×2 IMPLANT
TOWEL GREEN STERILE FF (TOWEL DISPOSABLE) ×2 IMPLANT
TRAY FOLEY MTR SLVR 16FR STAT (SET/KITS/TRAYS/PACK) ×2 IMPLANT
TROCAR XCEL BLADELESS 5X75MML (TROCAR) ×2 IMPLANT
TROCAR XCEL NON-BLD 5MMX100MML (ENDOMECHANICALS) IMPLANT
WATER STERILE IRR 1000ML POUR (IV SOLUTION) ×4 IMPLANT

## 2019-01-07 NOTE — Plan of Care (Signed)

## 2019-01-07 NOTE — Anesthesia Procedure Notes (Signed)
Central Venous Catheter Insertion Performed by: Roderic Palau, MD, anesthesiologist Start/End5/18/2020 6:40 AM, 01/07/2019 6:55 AM Patient location: Pre-op. Preanesthetic checklist: patient identified, IV checked, site marked, risks and benefits discussed, surgical consent, monitors and equipment checked, pre-op evaluation, timeout performed and anesthesia consent Position: Trendelenburg Lidocaine 1% used for infiltration and patient sedated Hand hygiene performed , maximum sterile barriers used  and Seldinger technique used Catheter size: 8 Fr Total catheter length 16. Central line was placed.Double lumen Procedure performed using ultrasound guided technique. Ultrasound Notes:anatomy identified, needle tip was noted to be adjacent to the nerve/plexus identified, no ultrasound evidence of intravascular and/or intraneural injection and image(s) printed for medical record Attempts: 1 Following insertion, dressing applied, line sutured and Biopatch. Post procedure assessment: blood return through all ports  Patient tolerated the procedure well with no immediate complications.

## 2019-01-07 NOTE — Brief Op Note (Addendum)
01/07/2019  10:45 AM  PATIENT:  Sandy [name]  63 y.o. 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)  PROCEDURE:  Procedure(s):  VIDEO ASSISTED THORACOSCOPY (VATS)  Right Lower Lobe Wedge resection THORACOSCOPIC Right Lower Lobectomy LYMPH NODE DISSECTION INTERCOSTAL NERVE BLOCK (Right)   SURGEON:  Surgeon(s) and Role:    Melrose Nakayama, MD - Primary  PHYSICIAN ASSISTANT: Ellwood Handler PA-C  ANESTHESIA:   general  EBL:  100 ml  BLOOD ADMINISTERED:none  DRAINS: 28 Straight Chest Tube   LOCAL MEDICATIONS USED:  BUPIVICAINE   SPECIMEN:  Source of Specimen:  Segement Right Lower Lobe, Right Lower Lobe, Lymph Node Dissection  DISPOSITION OF SPECIMEN:  PATHOLOGY  COUNTS:  YES  TOURNIQUET:  * No tourniquets in log *  DICTATION: .Dragon Dictation  PLAN OF CARE: Admit to inpatient   PATIENT DISPOSITION:  PACU - hemodynamically stable.   [name] start of Pharmacological VTE agent (>24hrs) due to surgical blood loss or risk of bleeding: no

## 2019-01-07 NOTE — Anesthesia Procedure Notes (Signed)
Performed by: Shirlyn Goltz, CRNA

## 2019-01-07 NOTE — Anesthesia Postprocedure Evaluation (Signed)
Anesthesia Post Note  [name]  Procedure(s) Performed: VIDEO ASSISTED THORACOSCOPY (VATS) RIGHT LOWER LOBE WEDGE RESECTION (Right Chest) RIGHT LOWER LOBE LOBECTOMY AND INTERCOSTAL NERVE BLOCK (Right Chest)     Patient location during evaluation: PACU Anesthesia Type: General Level of consciousness: awake and alert Pain management: pain level controlled Vital Signs Assessment: post-procedure vital signs reviewed and stable Respiratory status: spontaneous breathing, nonlabored ventilation, respiratory function stable and patient connected to nasal cannula oxygen Cardiovascular status: blood pressure returned to baseline and stable Postop Assessment: no apparent nausea or vomiting Anesthetic complications: no    Last Vitals:  Vitals:   01/07/19 1155 01/07/19 1210  BP: 125/76 132/78  Pulse: 65 65  Resp: 16 17  Temp:    SpO2: 97% 99%    Last Pain:  Vitals:   01/07/19 1207  TempSrc:   PainSc: 5                  Coriana Angello,W. EDMOND

## 2019-01-07 NOTE — Interval H&P Note (Signed)
History and Physical Interval Note:  01/07/2019 7:20 AM  [name]  has presented today for surgery, with the diagnosis of RLL MASS.  The various methods of treatment have been discussed with the patient and family. After consideration of risks, benefits and other options for treatment, the patient has consented to  Procedure(s): VIDEO ASSISTED THORACOSCOPY (VATS)/WEDGE RESECTION (Right) possible LOBECTOMY (Right) as a surgical intervention.  The patient's history has been reviewed, patient examined, no change in status, stable for surgery.  I have reviewed the patient's chart and labs.  Questions were answered to the patient's satisfaction.     Sandy [name]

## 2019-01-07 NOTE — Op Note (Signed)
NAME: Sandy [name], Sandy [name] MEDICAL RECORD QQ:[id number] ACCOUNT [id number] DATE OF BIRTH:06/05/1956 FACILITY: [facility] LOCATION: [facility] PHYSICIAN:Diannie Willner Chaya Jan, MD  OPERATIVE REPORT  DATE OF PROCEDURE:  01/07/2019  PREOPERATIVE DIAGNOSIS:  Right lower lobe lung mass, suspected nonsmall-cell carcinoma, clinical stage 1B.  POSTOPERATIVE DIAGNOSIS:  Adenocarcinoma, right lower lobe, clinical stage 1B (T2, N0).  PROCEDURE:  Right video-assisted thoracoscopy,  Wedge resection from right lower lobe,  Thoracoscopic right lower lobectomy,  Mediastinal lymph node dissection,  Intercostal nerve blocks at levels 3-9.  SURGEON:  Modesto Charon, MD  ASSISTANT:  Ellwood Handler, PA-C  ANESTHESIA:  General.  FINDINGS:  Mass in superior segment of right lower lobe.  Frozen section revealed adenocarcinoma.  Bronchial margin free of tumor.  Benign-appearing lymph nodes.  CLINICAL NOTE:  Sandy [name] is a 63 year old woman with a history of tobacco abuse who was found to have a lung nodule on a low-dose CT for lung cancer screening.  A followup at 3 months showed the nodule had increased in size.  PET CT showed the  nodule was hypermetabolic.  There was no evidence of regional or distant metastases.  She was referred for consideration for surgical resection.  Pulmonary function testing showed adequate reserve.  She was felt to be a candidate for surgery.  She was  informed of the indications, risks, benefits, and alternatives.  She accepted the risks and agreed to proceed.  OPERATIVE NOTE:  The patient was brought to the preoperative holding area on 01/07/2019.  Anesthesia placed a central venous catheter and an arterial blood pressure monitoring line.  She was taken to the operating room, anesthetized and intubated with a  double-lumen endotracheal tube.  Intravenous antibiotics were administered.  A Foley catheter was placed.  Sequential compression devices were placed on the calves for DVT  prophylaxis.  She was placed in a left lateral decubitus position, and the right  chest was prepped and draped in the usual sterile fashion.  Single-lung ventilation of the left lung was initiated and was tolerated well throughout the procedure.  A timeout was performed.  A solution containing 20 mL of liposomal bupivacaine, 30 mL of 0.5% bupivacaine, and 50 mL of saline was prepared.  This was used for local anesthesia at the injection sites as well as for the intercostal nerve blocks.  The  incisions were injected prior to making the incision.  An incision was made at the seventh interspace in the mid axillary line.  A 5 mm port was inserted into the chest.  There was good isolation of the right lung, although it was relatively slow to  deflate.  There was no abnormality of the parietal pleura and no pleural effusion.  A 5 cm working incision was made in the 5th interspace anterolaterally.  No rib spreading was performed during the procedure.  The inferior pulmonary ligament was divided  with the Harmonic scalpel.  All lymph nodes that were encountered during the dissection were removed and sent as separate specimens for permanent pathology.  All the lymph nodes appeared relatively benign grossly.  The pleural reflection was divided at  the hilum posteriorly and then anteriorly.  Anteriorly, the phrenic nerve was visualized, and care was taken not to use cautery or the Harmonic in the vicinity of the phrenic nerve.  The mass was palpable in the posterior aspect of the superior segment  of the right lower lobe.  This was a relatively large mass, and a wedge resection would have disrupted the fissure, so decision was  made to go ahead and do the fissure dissection initially.  Part of the fissure was dissected with cautery.  Once the  pulmonary arterial vessels have been identified, the fissure was completed with sequential firings of an Echelon stapler.  A 45 mm stapler using gold cartridges was used.  After  completing that fissure, it was clear that a wedge resection would  incorporate the superior segmental pulmonary arterial branch, so this vessel was dissected out and divided with the vascular stapler.  A wedge resection was then performed, again using sequential firings of the Echelon 45 mm stapler.  The specimen was  placed into an endoscopic retrieval bag, removed and sent for frozen section of the mass which subsequently returned showing adenocarcinoma.  While awaiting the results of the frozen section, intercostal nerve blocks were performed from the 3rd to the 9th interspace.  The needle was placed from a posterior approach, and 10 mL of the bupivacaine solution was injected subpleurally into each  interspace.  Once the results of the frozen section returned showing adenocarcinoma, the inferior pulmonary vein was dissected out, encircled and divided with the vascular stapler, and then the basilar segmental trunk of the pulmonary artery was divided with the vascular  stapler as well.  Once this was done, it was noted that there was a small accessory superior segmental branch that had not been incorporated in the previous staple lines.  This vessel was divided separately as well.  The Echelon stapler with a green  cartridge was placed across the right lower lobe bronchus at its origin and closed.  A test inflation revealed good aeration of the upper and middle lobes.  The stapler was fired, transecting the bronchus.  The lower lobe was removed  and sent for frozen section of the bronchial margin, which returned with no tumor seen.  The chest was copiously irrigated with warm saline.  A test inflation to 30 cm of water revealed no leakage from the bronchial stump.  A 28-French chest tube was  placed through the original port incision and secured with a #1 silk suture.  Dual-lung ventilation was resumed.  The working incision was closed in standard fashion in 3 layers.  The chest tube was placed to  suction.  The patient was placed back in the  supine position.  She was extubated in the operating room and taken to the postanesthetic care unit in good condition.  LN/NUANCE  D:01/07/2019 T:01/07/2019 JOB:[id number]

## 2019-01-07 NOTE — Anesthesia Procedure Notes (Signed)
Procedure Name: Intubation Date/Time: 01/07/2019 7:45 AM Performed by: Shirlyn Goltz, CRNA Pre-anesthesia Checklist: Patient identified, Emergency Drugs available and Suction available Patient Re-evaluated:Patient Re-evaluated prior to induction Oxygen Delivery Method: Circle system utilized Preoxygenation: Pre-oxygenation with 100% oxygen Induction Type: IV induction and Rapid sequence Laryngoscope Size: Mac and 3 Tube type: Oral Endobronchial tube: Left and Double lumen EBT and 37 Fr Number of attempts: 1 Airway Equipment and Method: Stylet and Oral airway Placement Confirmation: ETT inserted through vocal cords under direct vision,  positive ETCO2 and breath sounds checked- equal and bilateral Secured at: 29 cm Tube secured with: Tape Dental Injury: Teeth and Oropharynx as per pre-operative assessment

## 2019-01-07 NOTE — Anesthesia Procedure Notes (Signed)
Arterial Line Insertion Start/End5/18/2020 7:00 AM, 01/07/2019 7:10 AM Performed by: Shirlyn Goltz, CRNA  Patient location: Pre-op. Preanesthetic checklist: patient identified, IV checked, site marked, risks and benefits discussed, surgical consent, monitors and equipment checked, pre-op evaluation and anesthesia consent Lidocaine 1% used for infiltration and patient sedated Left, radial was placed Catheter size: 20 G Hand hygiene performed , maximum sterile barriers used  and Seldinger technique used Allen's test indicative of satisfactory collateral circulation Attempts: 1 Procedure performed without using ultrasound guided technique. Following insertion, Biopatch and dressing applied. Post procedure assessment: normal

## 2019-01-07 NOTE — Transfer of Care (Signed)
Immediate Anesthesia Transfer of Care Note  [name]  Procedure(s) Performed: VIDEO ASSISTED THORACOSCOPY (VATS) RIGHT LOWER LOBE WEDGE RESECTION (Right Chest) RIGHT LOWER LOBE LOBECTOMY AND INTERCOSTAL NERVE BLOCK (Right Chest)  Patient Location: PACU  Anesthesia Type:General  Level of Consciousness: drowsy and patient cooperative  Airway & Oxygen Therapy: Patient Spontanous Breathing and Patient connected to nasal cannula oxygen  Post-op Assessment: Report given to RN and Post -op Vital signs reviewed and stable spo2 92% on 3L [place].  Post vital signs: Reviewed and stable  Last Vitals:  Vitals Value Taken Time  BP    Temp    Pulse 60 01/07/2019 11:05 AM  Resp 14 01/07/2019 11:05 AM  SpO2 92 % 01/07/2019 11:05 AM  Vitals shown include unvalidated device data.  Last Pain:  Vitals:   01/07/19 0546  TempSrc:   PainSc: 0-No pain      Patients Stated Pain Goal: 3 (22/58/34 6219)  Complications: No apparent anesthesia complications

## 2019-01-08 ENCOUNTER — Encounter (HOSPITAL_COMMUNITY): Payer: Self-pay | Admitting: Thoracic Surgery (Cardiothoracic Vascular Surgery)

## 2019-01-08 ENCOUNTER — Inpatient Hospital Stay (HOSPITAL_COMMUNITY): Payer: Medicare Other

## 2019-01-08 LAB — BLOOD GAS, ARTERIAL
Acid-base deficit: 0.5 mmol/L (ref 0.0–2.0)
Bicarbonate: 23.8 mmol/L (ref 20.0–28.0)
O2 Content: 4 L/min
O2 Saturation: 94.5 %
Patient temperature: 98.6
pCO2 arterial: 40.5 mmHg (ref 32.0–48.0)
pH, Arterial: 7.387 (ref 7.350–7.450)
pO2, Arterial: 76.9 mmHg — ABNORMAL LOW (ref 83.0–108.0)

## 2019-01-08 LAB — GLUCOSE, CAPILLARY
Glucose-Capillary: 118 mg/dL — ABNORMAL HIGH (ref 70–99)
Glucose-Capillary: 128 mg/dL — ABNORMAL HIGH (ref 70–99)
Glucose-Capillary: 103 mg/dL — ABNORMAL HIGH (ref 70–99)
Glucose-Capillary: 126 mg/dL — ABNORMAL HIGH (ref 70–99)
Glucose-Capillary: 106 mg/dL — ABNORMAL HIGH (ref 70–99)

## 2019-01-08 LAB — BASIC METABOLIC PANEL
Anion gap: 10 (ref 5–15)
BUN: 6 mg/dL — ABNORMAL LOW (ref 8–23)
CO2: 21 mmol/L — ABNORMAL LOW (ref 22–32)
Calcium: 8.4 mg/dL — ABNORMAL LOW (ref 8.9–10.3)
Chloride: 102 mmol/L (ref 98–111)
Creatinine, Ser: 0.63 mg/dL (ref 0.44–1.00)
GFR calc Af Amer: >60 mL/min (ref >60)
GFR calc non Af Amer: >60 mL/min (ref >60)
Glucose, Bld: 117 mg/dL — ABNORMAL HIGH (ref 70–99)
Potassium: 3.8 mmol/L (ref 3.5–5.1)
Sodium: 133 mmol/L — ABNORMAL LOW (ref 135–145)

## 2019-01-08 LAB — CBC
HCT: 35.9 % — ABNORMAL LOW (ref 36.0–46.0)
Hemoglobin: 10.6 g/dL — ABNORMAL LOW (ref 12.0–15.0)
MCH: 24 pg — ABNORMAL LOW (ref 26.0–34.0)
MCHC: 29.5 g/dL — ABNORMAL LOW (ref 30.0–36.0)
MCV: 81.4 fL (ref 80.0–100.0)
Platelets: 186 10*3/uL (ref 150–400)
RBC: 4.41 MIL/uL (ref 3.87–5.11)
RDW: 15.6 % — ABNORMAL HIGH (ref 11.5–15.5)
WBC: 8.6 10*3/uL (ref 4.0–10.5)
nRBC: 0 % (ref 0.0–0.2)

## 2019-01-08 MED ORDER — INSULIN ASPART 100 UNIT/ML ~~LOC~~ SOLN
0.0000 [IU] | Freq: Three times a day (TID) | SUBCUTANEOUS | Status: DC
  Administered 2019-01-08: 17:00:00 2 [IU] via SUBCUTANEOUS

## 2019-01-08 MED ORDER — HYDROMORPHONE HCL 2 MG PO TABS: 1.0000 mg | ORAL_TABLET | Freq: Four times a day (QID) | ORAL | Status: DC | PRN

## 2019-01-08 MED ORDER — METFORMIN HCL ER 500 MG PO TB24
500.0000 mg | ORAL_TABLET | Freq: Every day | ORAL | Status: DC
  Administered 2019-01-08 – 2019-01-09 (×2): 500 mg via ORAL
  Filled 2019-01-08 (×2): qty 1

## 2019-01-08 MED ORDER — ONDANSETRON HCL 4 MG PO TABS: 4.0000 mg | ORAL_TABLET | Freq: Four times a day (QID) | ORAL | 0 refills | Status: DC | PRN

## 2019-01-08 MED ORDER — LEVALBUTEROL HCL 0.63 MG/3ML IN NEBU
0.6300 mg | INHALATION_SOLUTION | Freq: Three times a day (TID) | RESPIRATORY_TRACT | Status: DC
  Administered 2019-01-09 – 2019-01-10 (×4): 0.63 mg via RESPIRATORY_TRACT
  Filled 2019-01-08 (×4): qty 3

## 2019-01-08 MED ORDER — LOSARTAN POTASSIUM 50 MG PO TABS
50.0000 mg | ORAL_TABLET | Freq: Every day | ORAL | Status: DC
  Administered 2019-01-08 – 2019-01-09 (×3): 50 mg via ORAL
  Filled 2019-01-08 (×3): qty 1

## 2019-01-08 MED ORDER — SODIUM CHLORIDE 0.45 % IV SOLN
INTRAVENOUS | Status: DC
  Administered 2019-01-08: 08:00:00 via INTRAVENOUS

## 2019-01-08 NOTE — Plan of Care (Signed)

## 2019-01-08 NOTE — Progress Notes (Addendum)
      [address]       [place] [address]             [phone]      1 Day Post-Op Procedure(s) (LRB): VIDEO ASSISTED THORACOSCOPY (VATS) RIGHT LOWER LOBE WEDGE RESECTION (Right) RIGHT LOWER LOBE LOBECTOMY AND INTERCOSTAL NERVE BLOCK (Right)   Subjective:  Patient doing okay.  Having some pain which is relieved with PCA.  Zofran is helping nausea.  Objective: Vital signs in last 24 hours: Temp:  [97 F (36.1 C)-97.9 F (36.6 C)] 97.9 F (36.6 C) (05/19 0721) Pulse Rate:  [64-73] 65 (05/19 0721) Cardiac Rhythm: Normal sinus rhythm (05/19 0721) Resp:  [10-17] 13 (05/19 0721) BP: (118-174)/(72-91) 174/79 (05/19 0721) SpO2:  [91 %-99 %] 91 % (05/19 0721) Arterial Line BP: (143-184)/(73-110) 184/98 (05/19 0721) Weight:  [83.9 kg] 83.9 kg (05/18 1325)  Intake/Output from previous day: 05/18 0701 - 05/19 0700 In: 2753.3 [P.O.:240; I.V.:2513.3] Out: 2030 [Urine:1650; Blood:150; Chest Tube:230] Intake/Output this shift: Total I/O In: -  Out: 390 [Urine:350; Chest Tube:40]  General appearance: alert, cooperative and no distress Heart: regular rate and rhythm Lungs: clear to auscultation bilaterally Abdomen: soft, non-tender; bowel sounds normal; no masses,  no organomegaly Extremities: extremities normal, atraumatic, no cyanosis or edema Wound: clean and dry, ecchymosis of incision  Lab Results: Recent Labs    01/08/19 0411  WBC 8.6  HGB 10.6*  HCT 35.9*  PLT 186   BMET:  Recent Labs    01/08/19 0411  NA 133*  K 3.8  CL 102  CO2 21*  GLUCOSE 117*  BUN 6*  CREATININE 0.63  CALCIUM 8.4*    PT/INR: No results for input(s): LABPROT, INR in the last 72 hours. ABG    Component Value Date/Time   PHART 7.387 01/08/2019 0400   HCO3 23.8 01/08/2019 0400   ACIDBASEDEF 0.5 01/08/2019 0400   O2SAT 94.5 01/08/2019 0400   CBG (last 3)  Recent Labs    01/07/19 2022 01/07/19 2323 01/08/19 0326  GLUCAP 263* 201* 118*    Assessment/Plan:  S/P Procedure(s) (LRB): VIDEO ASSISTED THORACOSCOPY (VATS) RIGHT LOWER LOBE WEDGE RESECTION (Right) RIGHT LOWER LOBE LOBECTOMY AND INTERCOSTAL NERVE BLOCK (Right)  1. Chest tube- no air leak, CXR with increased atelectasis, will place chest tube to water seal 2. Pulm- no acute issues, CXR with increased atelectasis, will order flutter valve, repeat CXR in AM 3. CV- NSR, + HTN- both antihypertensive medications given last night, however SBP is 170, pain could be attributing to this, will increase Cozaar dose 4. D/C Arterial line 5. IV fluids to KVO 6. Pain control- unable to take oxycodone due to vomiting, tolerating morphine PCA.. will try to use Tramadol for oral control, but have ordered Dilaudid for additional relief if tramadol doesn't work 7. DM- cbgs elevated, continue SSIP, Metformin restarted for evening regimen 8. Lovenox for DVT prophylaxis 9. Dispo- patient stable, increase Cozaar for better BP control, zofran is controlling nausea, restart evening regimen of Metformin, repeat CXR in AM   LOS: 1 day    Ellwood Handler 01/08/2019 Patient seen and examined, agree with above  Remo Lipps C. Roxan Hockey, MD [facility] [phone]

## 2019-01-08 NOTE — Discharge Summary (Addendum)
Physician Discharge Summary  Patient ID: Sandy [name] MRN: [id number] DOB/AGE: 63-Dec-1957 63 y.o.  Admit date: 01/07/2019 Discharge date: 01/10/2019  Admission Diagnoses: Lung Nodule- suspected T2N0 non-small cell carcinoma  Patient Active Problem List   Diagnosis Date Noted  . Type 2 diabetes mellitus ([place]) 12/18/2018  . Solitary pulmonary nodule on lung CT 12/03/2018  . Alcoholic cirrhosis ([place]) 93/23/5573  . History of esophageal varices 10/18/2017  . De Quervain's tenosynovitis, right 04/17/2015   Discharge Diagnoses: Adenocarcinoma right lower lobe- T2N2  Patient Active Problem List   Diagnosis Date Noted  . Primary lung adenocarcinoma, right ([place]) 01/10/2019  . S/P lobectomy of lung 01/07/2019  . Type 2 diabetes mellitus ([place]) 12/18/2018  . Solitary pulmonary nodule on lung CT 12/03/2018  . Alcoholic cirrhosis ([place]) 22/09/5425  . History of esophageal varices 10/18/2017  . De Quervain's tenosynovitis, right 04/17/2015   Discharged Condition: good  History of Present Illness:  [name] is a 63 yo white female with known history of tobacco abuse, Type 2 DM without complication, cirrhosis with esophageal varices, HTN, Dyslipidemia, and arthritis.  She underwent a low dose screening CT scan in December.  She was found to have a right lower lobe lung nodule.  Repeat CT scan in 3 months was recommended.  This was performed and showed the right lower lobe lung nodule had increased in size.  She was referred to Dr. Melvyn Novas who recommended PET/CT.  This showed the mass to be hypermetabolic.  There was no hilar or mediastinal lymphadenopathy.  There was also a lesion in the left kidney.  Urology follow up was recommended but she had not been seen by them.  She was referred to Dr. Roxan Hockey for surgical resection.  The patient admits to a pack of cigarettes daily for 37 years before quitting in 2015, due to treatment of esophageal varices.  She also quit alcohol at this time.  She  denies any weight loss.  She does have some shortness of breath during episodes of palpitations.  She is able to ambulate for 10 minutes without stopping, but will get short of breath while walking up an incline.  It was felt surgical resection would be the best treatment option.  The risks and benefits of the procedure were explained to the patient and she was agreeable to proceed.    Hospital Course:   [name] presented to [facility] on 01/07/2019.  She was taken to the operating room and underwent Right VATS with segmentectomy of right lower lobe, completion lobectomy right lower lobe, lymph node dissection, and intercostal nerve block.  She tolerated the procedure without difficulty, was extubated and taken to the PACU in stable condition.  The patient did well post operatively.  She did not have an air leak and was transitioned to water seal on 01/08/2019. Follow up CXR showed no pneumothorax.  Her chest tube was removed on 01/09/2019.  Follow up CXR remained stable without pneumothorax and atelectasis improved.  She has issues with nausea related to pain medication use, but has improved with use of zofran.  She was hypertensive and her home Cozaar dose was increased.  She was restarted on her home regimen of Metformin.  She is tolerating a carb modified diet.  She is ambulating independently.  Her incisions are healing without evidence of infection.    Significant Diagnostic Studies: nuclear medicine:   1. Hypermetabolic superior segment right lower lobe lung mass, consistent with primary bronchogenic carcinoma. Presuming non-small-cell histology, most consistent  with T2aN0M0 or stage IB. 2. Cirrhosis and hepatic steatosis 3. Indeterminate left renal lesions which could represent complex cysts or solid neoplasms. Consider dedicated pre and post contrast abdominal MRI. 4. Age advanced coronary artery atherosclerosis. Recommend assessment of coronary risk factors and consideration of  medical therapy. 5. Aortic atherosclerosis (ICD10-I70.0) and emphysema (ICD10-J43.9).  1. Lung, wedge biopsy/resection, RLL superior segment - INVASIVE ADENOCARCINOMA, WELL-DIFFERENTIATED, SPANNING 4 CM. - TUMOR IS LIMITED TO LUNG. - FINAL RESECTION MARGIN (PART #2) IS NEGATIVE. - SEE ONCOLOGY TABLE. 2. Lung, resection (segmental or lobe), bronchial margin (frozen) - BENIGN LUNG WITH EMPHYSEMATOUS CHANGES. - RESECTION MARGINS ARE NEGATIVE FOR CARCINOMA. 3. Lymph node, biopsy, level 7 - METASTATIC CARCINOMA IN ONE OF ONE LYMPH NODES (1/1). 4. Lymph node, biopsy, level 12 - METASTATIC CARCINOMA IN ONE OF TWO LYMPH NODES (1/2). 5. Lymph node, biopsy, level 10 - ONE OF ONE LYMPH NODES NEGATIVE FOR CARCINOMA (0/1). 6. Lymph node, biopsy, level 11 - ONE OF ONE LYMPH NODES NEGATIVE FOR CARCINOMA (0/1). 7. Lymph node, biopsy, level 11 #2 - ONE OF ONE LYMPH NODES NEGATIVE FOR CARCINOMA (0/1). 8. Lymph node, biopsy, level 4R - ONE OF ONE LYMPH NODES NEGATIVE FOR CARCINOMA (0/1). 9. Lymph node, biopsy, level 2R - ONE OF ONE LYMPH NODES NEGATIVE FOR CARCINOMA (0/1).  Treatments: surgery:    Right video-assisted thoracoscopy, wedge resection from right lower lobe, thoracoscopic right lower lobectomy, mediastinal lymph node dissection, intercostal nerve blocks at levels 3-9.  Discharge Exam: Blood pressure (!) 151/61, pulse 67, temperature 97.9 F (36.6 C), temperature source Oral, resp. rate 17, height 5\' 4"  (1.626 m), weight 83.9 kg, SpO2 98 %.  General appearance: alert, cooperative and no distress Heart: regular rate and rhythm Lungs: clear to auscultation bilaterally Abdomen: soft, non-tender; bowel sounds normal; no masses,  no organomegaly Extremities: extremities normal, atraumatic, no cyanosis or edema Wound: clean and dry, some ecchymosis at incision site  Discharge disposition: 01-Home or Self Care  Discharge Medications:  Allergies as of 01/10/2019      Reactions    Codeine Nausea And Vomiting   Nsaids    Avoid due to esophageal varices      Medication List    TAKE these medications   acetaminophen 500 MG tablet Commonly known as:  TYLENOL Take 1,000 mg by mouth every 6 (six) hours as needed for moderate pain or headache.   atorvastatin 10 MG tablet Commonly known as:  LIPITOR Take 10 mg by mouth at bedtime.   diphenhydrAMINE 25 MG tablet Commonly known as:  BENADRYL Take 50 mg by mouth at bedtime as needed.   losartan 50 MG tablet Commonly known as:  COZAAR Take 1 tablet (50 mg total) by mouth daily. What changed:    medication strength  how much to take  when to take this   metFORMIN 500 MG 24 hr tablet Commonly known as:  GLUCOPHAGE-XR Take 500 mg by mouth at bedtime.   nadolol 40 MG tablet Commonly known as:  CORGARD Take 40 mg by mouth at bedtime.   omeprazole 20 MG capsule Commonly known as:  PRILOSEC Take 20 mg by mouth daily.   ondansetron 4 MG tablet Commonly known as:  Zofran Take 1 tablet (4 mg total) by mouth every 6 (six) hours as needed for nausea or vomiting.   traMADol 50 MG tablet Commonly known as:  ULTRAM Take 1-2 tablets (50-100 mg total) by mouth every 6 (six) hours as needed (mild pain).   [place]  1 drop into both eyes daily as needed (irritation).      Follow-up Information    Melrose Nakayama, MD Follow up on 01/22/2019.   Specialty:  Cardiothoracic Surgery Why:  Appointment is at 11:15, please get CXR at 10:45 located at [facility] located on the first floor of our office building Contact information: [place] [address] [phone]        [name], MD. Schedule an appointment as soon as possible for a visit.   Specialty:  Family Medicine Why:  At your convenience for follow up of Blood pressure medication,new dosing Contact information: [place] [address] [phone]            Signed: Ellwood Handler PA-C 01/10/2019, 8:27 AM

## 2019-01-09 ENCOUNTER — Inpatient Hospital Stay (HOSPITAL_COMMUNITY): Payer: Medicare Other

## 2019-01-09 LAB — COMPREHENSIVE METABOLIC PANEL
ALT: 20 U/L (ref 0–44)
AST: 30 U/L (ref 15–41)
Albumin: 3 g/dL — ABNORMAL LOW (ref 3.5–5.0)
Alkaline Phosphatase: 82 U/L (ref 38–126)
Anion gap: 8 (ref 5–15)
BUN: 7 mg/dL — ABNORMAL LOW (ref 8–23)
CO2: 26 mmol/L (ref 22–32)
Calcium: 8.5 mg/dL — ABNORMAL LOW (ref 8.9–10.3)
Chloride: 102 mmol/L (ref 98–111)
Creatinine, Ser: 0.7 mg/dL (ref 0.44–1.00)
GFR calc Af Amer: >60 mL/min (ref >60)
GFR calc non Af Amer: >60 mL/min (ref >60)
Glucose, Bld: 95 mg/dL (ref 70–99)
Potassium: 4.2 mmol/L (ref 3.5–5.1)
Sodium: 136 mmol/L (ref 135–145)
Total Bilirubin: 1.2 mg/dL (ref 0.3–1.2)
Total Protein: 6.8 g/dL (ref 6.5–8.1)

## 2019-01-09 LAB — CBC
HCT: 37 % (ref 36.0–46.0)
Hemoglobin: 10.9 g/dL — ABNORMAL LOW (ref 12.0–15.0)
MCH: 24.4 pg — ABNORMAL LOW (ref 26.0–34.0)
MCHC: 29.5 g/dL — ABNORMAL LOW (ref 30.0–36.0)
MCV: 83 fL (ref 80.0–100.0)
Platelets: 223 10*3/uL (ref 150–400)
RBC: 4.46 MIL/uL (ref 3.87–5.11)
RDW: 16 % — ABNORMAL HIGH (ref 11.5–15.5)
WBC: 8.2 10*3/uL (ref 4.0–10.5)
nRBC: 0.2 % (ref 0.0–0.2)

## 2019-01-09 LAB — GLUCOSE, CAPILLARY
Glucose-Capillary: 113 mg/dL — ABNORMAL HIGH (ref 70–99)
Glucose-Capillary: 139 mg/dL — ABNORMAL HIGH (ref 70–99)
Glucose-Capillary: 79 mg/dL (ref 70–99)
Glucose-Capillary: 84 mg/dL (ref 70–99)
Glucose-Capillary: 90 mg/dL (ref 70–99)

## 2019-01-09 MED ORDER — ONDANSETRON HCL 4 MG/2ML IJ SOLN: 4.0000 mg | Freq: Four times a day (QID) | INTRAMUSCULAR | Status: DC | PRN

## 2019-01-09 NOTE — Plan of Care (Signed)

## 2019-01-09 NOTE — Progress Notes (Addendum)
      [address]       [place] [address]             [phone]      2 Days Post-Op Procedure(s) (LRB): VIDEO ASSISTED THORACOSCOPY (VATS) RIGHT LOWER LOBE WEDGE RESECTION (Right) RIGHT LOWER LOBE LOBECTOMY AND INTERCOSTAL NERVE BLOCK (Right)   Subjective:  Patient with a little more pain this morning.  Nausea under good control.   Objective: Vital signs in last 24 hours: Temp:  [97.5 F (36.4 C)-98 F (36.7 C)] 98 F (36.7 C) (05/20 0717) Pulse Rate:  [63-80] 80 (05/20 0500) Cardiac Rhythm: Normal sinus rhythm (05/19 2000) Resp:  [10-17] 17 (05/20 0500) BP: (120-166)/(67-88) 155/76 (05/20 0717) SpO2:  [90 %-95 %] 92 % (05/20 0500)  Intake/Output from previous day: 05/19 0701 - 05/20 0700 In: 826.3 [P.O.:720; I.V.:106.3] Out: 1630 [Urine:1400; Chest Tube:230]  General appearance: alert, cooperative and no distress Heart: regular rate and rhythm Lungs: diminished breath sounds bibasilar Abdomen: soft, non-tender; bowel sounds normal; no masses,  no organomegaly Extremities: extremities normal, atraumatic, no cyanosis or edema Wound: clean and dry  Lab Results: Recent Labs    01/08/19 0411 01/09/19 0615  WBC 8.6 8.2  HGB 10.6* 10.9*  HCT 35.9* 37.0  PLT 186 223   BMET:  Recent Labs    01/08/19 0411 01/09/19 0615  NA 133* 136  K 3.8 4.2  CL 102 102  CO2 21* 26  GLUCOSE 117* 95  BUN 6* 7*  CREATININE 0.63 0.70  CALCIUM 8.4* 8.5*    PT/INR: No results for input(s): LABPROT, INR in the last 72 hours. ABG    Component Value Date/Time   PHART 7.387 01/08/2019 0400   HCO3 23.8 01/08/2019 0400   ACIDBASEDEF 0.5 01/08/2019 0400   O2SAT 94.5 01/08/2019 0400   CBG (last 3)  Recent Labs    01/08/19 2120 01/09/19 0032 01/09/19 0623  GLUCAP 106* 113* 79    Assessment/Plan: S/P Procedure(s) (LRB): VIDEO ASSISTED THORACOSCOPY (VATS) RIGHT LOWER LOBE WEDGE RESECTION (Right) RIGHT LOWER LOBE LOBECTOMY AND INTERCOSTAL NERVE BLOCK  (Right)  1. Chest tube- no air leak present, + tidaling, 230 cc output yesterday- will d/c chest tube today 2. CV- NSR, HTN, improved- continue Cozaar, Nadolol 3. Pulm- poor quality CXR, no pneumothorax, continued atelectasis 4. D/C foley catheter 5. DM- sugars controlled 6. Lovenox for DVT prophylaxis 7. Dispo- patient stable, d/c chest tube, PCA, BP improved with increased Cozaar, d/c foley... will get 2V CXR after chest tube removal to obtain better film   LOS: 2 days    Ellwood Handler 01/09/2019 Patient seen and examined, agree with above CXR is badly rotated but there is no pneumo  Remo Lipps C. Roxan Hockey, MD [facility] [phone]

## 2019-01-09 NOTE — Progress Notes (Signed)
PCA d/c 09:30 per order. 20 mg morphine wasted in medication bin, witnessed by Mady Gemma RN.

## 2019-01-10 ENCOUNTER — Other Ambulatory Visit: Payer: Self-pay | Admitting: *Deleted

## 2019-01-10 ENCOUNTER — Inpatient Hospital Stay (HOSPITAL_COMMUNITY): Payer: Medicare Other

## 2019-01-10 ENCOUNTER — Encounter: Payer: Self-pay | Admitting: *Deleted

## 2019-01-10 DIAGNOSIS — C3431 Malignant neoplasm of lower lobe, right bronchus or lung: Secondary | ICD-10-CM

## 2019-01-10 HISTORY — DX: Malignant neoplasm of lower lobe, right bronchus or lung: C34.31

## 2019-01-10 LAB — GLUCOSE, CAPILLARY: Glucose-Capillary: 122 mg/dL — ABNORMAL HIGH (ref 70–99)

## 2019-01-10 MED ORDER — TRAMADOL HCL 50 MG PO TABS: 50.0000 mg | ORAL_TABLET | Freq: Four times a day (QID) | ORAL | 0 refills | Status: DC | PRN

## 2019-01-10 MED ORDER — LOSARTAN POTASSIUM 50 MG PO TABS: 50.0000 mg | ORAL_TABLET | Freq: Every day | ORAL | 3 refills | Status: DC

## 2019-01-10 NOTE — Discharge Instructions (Signed)
Video-Assisted Thoracic Surgery, Care After °This sheet gives you information about how to care for yourself after your procedure. Your health care provider may also give you more specific instructions. If you have problems or questions, contact your health care provider. °What can I expect after the procedure? °After the procedure, it is common to have: °· Some pain and soreness in your chest. °· Pain when breathing in (inhaling) and coughing. °· Constipation. °· Fatigue. °· Difficulty sleeping. °Follow these instructions at home: °Preventing pneumonia °· Take deep breaths or do breathing exercises as instructed by your health care provider. Doing this helps prevent lung infection (pneumonia). °· Cough frequently. Coughing may cause discomfort, but it is important to clear mucus (phlegm) and expand your lungs. If it hurts to cough, hold a pillow against your chest or place the palms of both hands on top of the incision (use splinting) when you cough. This may help relieve discomfort. °· If you were given an incentive spirometer, use it as directed. An incentive spirometer is a tool that measures how well you are filling your lungs with each breath. °· Participate in pulmonary rehabilitation as directed by your health care provider. This is a program that combines education, exercise, and support from a team of specialists. The goal is to help you heal and get back to your normal activities as soon as possible. °Medicines °· Take over-the-counter or prescription medicines only as told by your health care provider. °· If you have pain, take pain-relieving medicine before your pain becomes severe. This is important because if your pain is under control, you will be able to breathe and cough more comfortably. °· If you were prescribed an antibiotic medicine, take it as told by your health care provider. Do not stop taking the antibiotic even if you start to feel better. °Activity °· Ask your health care provider what  activities are safe for you. °· Avoid activities that use your chest muscles for at least 3-4 weeks. °· Do not lift anything that is heavier than 10 lb (4.5 kg), or the limit that your health care provider tells you, until he or she says that it is safe. °Incision care °· Follow instructions from your health care provider about how to take care of your incision(s). Make sure you: °? Wash your hands with soap and water before you change your bandage (dressing). If soap and water are not available, use hand sanitizer. °? Change your dressing as told by your health care provider. °? Leave stitches (sutures), skin glue, or adhesive strips in place. These skin closures may need to stay in place for 2 weeks or longer. If adhesive strip edges start to loosen and curl up, you may trim the loose edges. Do not remove adhesive strips completely unless your health care provider tells you to do that. °· Keep your dressing dry until it has been removed. °· Check your incision area every day for signs of infection. Check for: °? Redness, swelling, or pain. °? Fluid or blood. °? Warmth. °? Pus or a bad smell. °Bathing °· Do not take baths, swim, or use a hot tub until your health care provider approves. You may take showers. °· After your dressing has been removed, use soap and water to gently wash your incision area. Do not use anything else to clean your incision(s) unless your health care provider tells you to do this. °Driving ° °· Do not drive until your health care provider approves. °· Do not drive or   use heavy machinery while taking prescription pain medicine. °Eating and drinking °· Eat a healthy, balanced diet as instructed by your health care provider. A healthy diet includes plenty of fresh fruits and vegetables, whole grains, and low-fat (lean) proteins. °· Limit foods that are high in fat and processed sugars, such as fried and sweet foods. °· Drink enough fluid to keep your urine clear or pale yellow. °General  instructions ° °· To prevent or treat constipation while you are taking prescription pain medicine, your health care provider may recommend that you: °? Take over-the-counter or prescription medicines. °? Eat foods that are high in fiber, such as beans, fresh fruits and vegetables, and whole grains. °· Do not use any products that contain nicotine or tobacco, such as cigarettes and e-cigarettes. If you need help quitting, ask your health care provider. °· Avoid secondhand smoke. °· Wear compression stockings as told by your health care provider. These stockings help to prevent blood clots and reduce swelling in your legs. °· If you have a chest tube, care for it as instructed by your health care provider. Do not travel by airplane during the 2 weeks after your chest tube is removed, or until your health care provider says that this is safe. °· Keep all follow-up visits as told by your health care provider. This is important. °Contact a health care provider if: °· You have redness, swelling, or pain around an incision. °· You have fluid or blood coming from an incision. °· Your incision area feels warm to the touch. °· You have pus or a bad smell coming from an incision. °· You have a fever or chills. °· You have nausea or vomiting. °· You have pain that does not get better with medicine. °Get help right away if: °· You have chest pain. °· Your heart is fluttering or beating rapidly. °· You develop a rash. °· You have shortness of breath or trouble breathing. °· You are confused. °· You have trouble speaking. °· You feel weak, light-headed, or dizzy. °· You faint. °Summary °· To help prevent lung infection (pneumonia), take deep breaths or do breathing exercises as instructed by your health care provider. °· Cough frequently to clear mucus (phlegm) and expand your lungs. If it hurts to cough, hold a pillow against your chest or place the palms of both hands on top of the incision (use splinting) when you cough. °· If  you have pain, take pain-relieving medicine before your pain becomes severe. This is important because if your pain is under control, you will be able to breathe and cough more comfortably. °· Ask your health care provider what activities are safe for you. °This information is not intended to replace advice given to you by your health care provider. Make sure you discuss any questions you have with your health care provider. °Document Released: 12/03/2012 Document Revised: 07/18/2016 Document Reviewed: 07/18/2016 °Elsevier Interactive Patient Education © 2019 [employer] ° °

## 2019-01-10 NOTE — TOC Transition Note (Signed)
Transition of Care [facility]) - CM/SW Discharge Note   Patient Details  Name: Sandy [name] MRN: [id number] Date of Birth: [birth year]  [facility] of [facility]) CM/SW Contact:  Maryclare Labrador, RN Phone Number: 01/10/2019, 10:15 AM   Clinical Narrative:    PTA independent from home.  Pt has PCP and denied barriers with paying for medications. CM acknowledged consult that pt "may need home oxygen written prior in stay" however CM confirmed with bedside nurse that pt does not require home oxygen per ambulatory test - oxygen was not on AVS nor was official oxygen order written.  Pt in agreement that she does not need/want home oxygen, pt on RA prior to discharge.  NO CM needs determined prior to discharge - CM signing off     Final next level of care: Home/Self Care Barriers to Discharge: Barriers Resolved   Patient Goals and CMS Choice        Discharge Placement                       Discharge Plan and Services                                     Social Determinants of Health (SDOH) Interventions     Readmission Risk Interventions No flowsheet data found.

## 2019-01-10 NOTE — Progress Notes (Signed)
SATURATION QUALIFICATIONS: (This note is used to comply with regulatory documentation for home oxygen)  Patient Saturations on Room Air at Rest = 100%  Patient Saturations on Room Air while Ambulating = 89%

## 2019-01-10 NOTE — Progress Notes (Signed)
Oncology Nurse Navigator Documentation  Oncology Nurse Navigator Flowsheets 01/10/2019  Navigator Location [facility]  Navigator Encounter Type Other/per Dr. Julien Nordmann, foundation one and PDL 1 requested  Barriers/Navigation Needs Coordination of Care  Interventions Coordination of Care  Coordination of Care Other  Acuity Level 1  Time Spent with Patient 15

## 2019-01-10 NOTE — Progress Notes (Addendum)
      [address]       [place] [address]             [phone]      3 Days Post-Op Procedure(s) (LRB): VIDEO ASSISTED THORACOSCOPY (VATS) RIGHT LOWER LOBE WEDGE RESECTION (Right) RIGHT LOWER LOBE LOBECTOMY AND INTERCOSTAL NERVE BLOCK (Right)   Subjective:  No acute issues, pain is mostly controlled with Tramadol.  Nausea controlled.  + BM   Objective: Vital signs in last 24 hours: Temp:  [97.7 F (36.5 C)-98 F (36.7 C)] 97.9 F (36.6 C) (05/21 0024) Pulse Rate:  [67] 67 (05/20 1900) Cardiac Rhythm: Normal sinus rhythm (05/20 1945) Resp:  [17-21] 17 (05/21 0024) BP: (140-163)/(61-75) 151/61 (05/21 0024) SpO2:  [98 %-99 %] 98 % (05/20 2012)  Intake/Output from previous day: 05/20 0701 - 05/21 0700 In: 700 [P.O.:700] Out: 650 [Urine:650]  General appearance: alert, cooperative and no distress Heart: regular rate and rhythm Lungs: clear to auscultation bilaterally Abdomen: soft, non-tender; bowel sounds normal; no masses,  no organomegaly Extremities: extremities normal, atraumatic, no cyanosis or edema Wound: clean and dry, some ecchymosis at incision site  Lab Results: Recent Labs    01/08/19 0411 01/09/19 0615  WBC 8.6 8.2  HGB 10.6* 10.9*  HCT 35.9* 37.0  PLT 186 223   BMET:  Recent Labs    01/08/19 0411 01/09/19 0615  NA 133* 136  K 3.8 4.2  CL 102 102  CO2 21* 26  GLUCOSE 117* 95  BUN 6* 7*  CREATININE 0.63 0.70  CALCIUM 8.4* 8.5*    PT/INR: No results for input(s): LABPROT, INR in the last 72 hours. ABG    Component Value Date/Time   PHART 7.387 01/08/2019 0400   HCO3 23.8 01/08/2019 0400   ACIDBASEDEF 0.5 01/08/2019 0400   O2SAT 94.5 01/08/2019 0400   CBG (last 3)  Recent Labs    01/09/19 1626 01/09/19 2112 01/10/19 0810  GLUCAP 84 139* 122*    Assessment/Plan: S/P Procedure(s) (LRB): VIDEO ASSISTED THORACOSCOPY (VATS) RIGHT LOWER LOBE WEDGE RESECTION (Right) RIGHT LOWER LOBE LOBECTOMY AND INTERCOSTAL  NERVE BLOCK (Right)  1. CV- NSR, BP is under better control with increased dose of Cozaar, will continue at 50 mg daily, continue Nadolol... will need to follow up with PCP 2. Pulm- CXR is free from pneumothorax, atelectasis improved, patient instructed to continue IS at discharge 3. GI- nausea controlled, zofran RX provided 4. Pain well controlled on Tramadol 5. DM- sugars controlled, continue home Metformin 6. Dispo- patient stable, d/c central line, d/c home today   LOS: 3 days    Ellwood Handler 01/10/2019 Patient seen and examined, agree with above Path T2N2, pathologic stage IIIA adenocarcinoma Home today Will arrange Onc and Rad Onc consults as outpatient  Remo Lipps C. Roxan Hockey, MD [facility] [phone]

## 2019-01-10 NOTE — Progress Notes (Signed)
The proposed treatment discussed in cancer conference is for discussion purpose only and is not a binding recommendation. The patient was not physically examined nor present for their treatment options. Therefore, final treatment plans cannot be decided.  ?

## 2019-01-11 NOTE — Progress Notes (Signed)
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  Patient presented   PET 12/07/2018: The nodule is hypermetabolic with a maximum SUV of 7.6.  The nodule measured 3.5 x 2.4 cm.  No thoracic nodal hypermetabolism.  Normal adrenal glands.  Low density right renal lesions which are likely cysts.  Complex left renal lesions, including up to 1.2 cm.  CT Chest 3/32020: Interval growth of spiculated 29.4 mm superior segment right lower lobe pulmonary nodule, suspicious for primary bronchogenic carcinoma.  Indeterminate 1.8 cm hyperdense renal cortical lesion in the posterior upper left kidney, mildly increased in size since 2008.  CT Chest 07/27/2018: 2.7 cm nodule in the superior segment of the right lower lobe.   Biopsies of Right Lung and lymph nodes 01/07/2019   Tobacco/Marijuana/Snuff/ETOH use: Former smoker, quit in 2015.  Past/Anticipated interventions by cardiothoracic surgery, if any:  Dr. Roxan Hockey 12/18/2018 - I discussed her imaging and discussed potential diagnostic options including bronchoscopy or CT guided biopsy versus wedge resection. -We also discussed potential treatment options including surgery, radiation, and chemotherapy. -Given the high likelihood that this is lung cancer, I recommend that we proceed with right VATS for wedge resection and possible right lower lobectomy. -Left kidney lesion seen on CT/PET was present on CT abd in 2008.  She has an appointment with [facility] on May 8.  We will see what urology thinks about the renal lesion.  If it does need intervention I would think the lung mass would take precedence in terms of order of treatment. -Treatment plan: pulmonary function testing w/wo bronchodilators, neurology consult, right VATS for wedge resection and possible lower lobectomy on Monday, 01/07/2019. 01/10/2019 -Video assisted thoracoscopy  (VATS) right lower lobe wedge resection.   -Path T2N2, pathologic stage IIIA adenocarcinoma. -Will arrange Onc/Rad Onc consults. -Follow-up appointment 01/22/2019.  Past/Anticipated interventions by medical oncology, if any:  No appointments scheduled.  Signs/Symptoms  Weight changes, if any: No  Respiratory complaints, if any: Some SOB, constantly, especially since surgery.  Hemoptysis, if any: productive cough with clear/yellow phlegm, no blood noted.  Pain issues, if any:  Chest just above the boobs and mid back, right under the bra strap area.   SAFETY ISSUES:  Prior radiation? No  Pacemaker/ICD? No  Possible current pregnancy? Abdominal hysterectomy  Is the patient on methotrexate? No  Current Complaints / other details:   -Has esophageal varices

## 2019-01-15 ENCOUNTER — Encounter: Payer: Self-pay | Admitting: Radiation Oncology

## 2019-01-15 ENCOUNTER — Ambulatory Visit
Admission: RE | Admit: 2019-01-15 | Discharge: 2019-01-15 | Disposition: A | Discharge status: Home / Self Care | Payer: Medicare Other | Source: Ambulatory Visit | Attending: Radiation Oncology | Admitting: Radiation Oncology

## 2019-01-15 ENCOUNTER — Other Ambulatory Visit: Payer: Self-pay

## 2019-01-15 ENCOUNTER — Telehealth: Payer: Self-pay | Admitting: Internal Medicine

## 2019-01-15 VITALS — Ht 64.0 in | Wt 182.0 lb

## 2019-01-15 DIAGNOSIS — C3431 Malignant neoplasm of lower lobe, right bronchus or lung: Secondary | ICD-10-CM

## 2019-01-15 NOTE — Progress Notes (Signed)
Radiation Oncology         [phone] ________________________________  Name: Sandy [name]        MRN: [id number]  Date of Service: 01/15/2019 DOB: 1956-03-17  UD:JSHFWY, Sandy Jews, MD  Sandy Bears, MD     REFERRING PHYSICIAN: Curt Bears, MD   DIAGNOSIS: The encounter diagnosis was Malignant neoplasm of lower lobe of right lung [facility]).   HISTORY OF PRESENT ILLNESS: Sandy [name] is a 63 y.o. female seen at the request of Dr. Roxan Hockey and Dr. Julien Nordmann for a newly diagnosed non-small cell lung cancer arising in the right lower lobe.  The patient had a lung cancer screening CT back in December 2019.  She was found to have a right lower lobe nodule and repeat CT scan 3 months later was recommended.On 10/23/2018 this was performed revealing a 2.9 cm lesion in the superior segment of the posterior aspect of the right lower lobe.  No pathologically enlarged mediastinal or hilar nodes were identified.  She underwent a PET scan on 12/07/2018 that measured the lesion is be 3.5 x 2.4 cm with an SUV max of 7.6.  No thoracic nodal metabolism was identified, and she was felt to be a good surgical candidate. Of note a cystic change in the left kidney was noted on her CT based imaging and she's scheduled to undergo MRI abdomen on 01/31/2019 and to follow up with Dr. Gloriann Loan in urology.  She was taken to the operating room on 01/07/2019 where she underwent a video-assisted thoracoscopy with right lower lobe wedge resection.  Final pathology from this procedure revealed a 4 cm span of invasive adenocarcinoma that was well differentiated, limited to the lung, and final resection margins were negative.  She did have lymph nodes sampled, one was positive at level 7, another was positive at level 12, and a total of 8 lymph nodes were examined.  Because of the findings she is seen via WebEx to discuss the rationale for chemoradiation.  Her case was discussed last week and thoracic oncology conference, and she  is getting set up to see Dr. Julien Nordmann in medical oncology as well.     PREVIOUS RADIATION THERAPY: No   PAST MEDICAL HISTORY:  Past Medical History:  Diagnosis Date   Arthritis    Cirrhosis ([place])    COPD (chronic obstructive pulmonary disease) (HCC)    Dyslipidemia    Dyspnea    Esophageal varices ([facility])    History of blood transfusion    History of hiatal hernia    Hypertension    Pneumonia    Type 2 diabetes mellitus ([place])    Type II       PAST SURGICAL HISTORY: Past Surgical History:  Procedure Laterality Date   ABDOMINAL HYSTERECTOMY     ANTERIOR CRUCIATE LIGAMENT REPAIR Left    x 2   ESOPHAGOGASTRODUODENOSCOPY     LOBECTOMY Right 01/07/2019   Procedure: RIGHT LOWER LOBE LOBECTOMY AND INTERCOSTAL NERVE BLOCK;  Surgeon: Melrose Nakayama, MD;  Location: [facility] OR;  Service: Thoracic;  Laterality: Right;   VIDEO ASSISTED THORACOSCOPY (VATS)/WEDGE RESECTION Right 01/07/2019   Procedure: VIDEO ASSISTED THORACOSCOPY (VATS) RIGHT LOWER LOBE WEDGE RESECTION;  Surgeon: Melrose Nakayama, MD;  Location: [place];  Service: Thoracic;  Laterality: Right;     FAMILY HISTORY:  Family History  Problem Relation Age of Onset   Lung disease Neg Hx      SOCIAL HISTORY:  reports that she quit smoking about 4 years ago. She  has a 37.00 pack-year smoking history. She has never used smokeless tobacco. She reports current alcohol use of about 1.0 standard drinks of alcohol per week. She reports current drug use. Drug: Marijuana.  The patient is single.  She lives in [place].   ALLERGIES: Codeine and Nsaids   MEDICATIONS:  Current Outpatient Medications  Medication Sig Dispense Refill   acetaminophen (TYLENOL) 500 MG tablet Take 1,000 mg by mouth every 6 (six) hours as needed for moderate pain or headache.     atorvastatin (LIPITOR) 10 MG tablet Take 10 mg by mouth at bedtime.      diphenhydrAMINE (BENADRYL) 25 MG tablet Take 50 mg by mouth at bedtime as  needed.     losartan (COZAAR) 50 MG tablet Take 1 tablet (50 mg total) by mouth daily. 30 tablet 3   metFORMIN (GLUCOPHAGE-XR) 500 MG 24 hr tablet Take 500 mg by mouth at bedtime.     nadolol (CORGARD) 40 MG tablet Take 40 mg by mouth at bedtime.     omeprazole (PRILOSEC) 20 MG capsule Take 20 mg by mouth daily.      ondansetron (ZOFRAN) 4 MG tablet Take 1 tablet (4 mg total) by mouth every 6 (six) hours as needed for nausea or vomiting. 20 tablet 0   Tetrahydrozoline HCl (VISINE OP) Place 1 drop into both eyes daily as needed (irritation).     traMADol (ULTRAM) 50 MG tablet Take 1-2 tablets (50-100 mg total) by mouth every 6 (six) hours as needed (mild pain). 30 tablet 0   No current facility-administered medications for this encounter.      REVIEW OF SYSTEMS: On review of systems, the patient reports that she is doing well overall. She is sore in the chest wall area and somewhat short of breath when taking deep breaths, but feels as though she's improving slowly. She denies any chest pain, cough, fevers, chills, night sweats, unintended weight changes. She has a history of cirrhosis and esophageal varices and continues to be followed by Roosevelt Locks, NP and Dr. Gertie Fey. She's had several episodes of hemorrhage that have been managed over the years, but has not had any episodes since 2015 or 2016. She recalls having banding procedures in the past, and was last seen with EGD on 10/18/2017. She  denies any bowel or bladder disturbances, and denies abdominal pain, nausea or vomiting. She denies any new musculoskeletal or joint aches or pains. A complete review of systems is obtained and is otherwise negative.     PHYSICAL EXAM:  Wt Readings from Last 3 Encounters:  01/07/19 184 lb 15.5 oz (83.9 kg)  01/01/19 185 lb (83.9 kg)  12/18/18 185 lb (83.9 kg)   Temp Readings from Last 3 Encounters:  01/10/19 97.9 F (36.6 C) (Oral)  01/01/19 98.2 F (36.8 C)  12/18/18 97.9 F (36.6 C)   BP  Readings from Last 3 Encounters:  01/10/19 140/70  01/01/19 138/62  12/18/18 134/76   Pulse Readings from Last 3 Encounters:  01/10/19 63  01/01/19 72  12/18/18 76    In general this is a tired, but overall well appearing Caucasian female in no acute distress. She's alert and oriented x4 and appropriate throughout the examination. Cardiopulmonary assessment is negative for acute distress and she exhibits normal effort.    ECOG = 1  0 - Asymptomatic (Fully active, able to carry on all predisease activities without restriction)  1 - Symptomatic but completely ambulatory (Restricted in physically strenuous activity but ambulatory and able to  carry out work of a light or sedentary nature. For example, light housework, office work)  2 - Symptomatic, <50% in bed during the day (Ambulatory and capable of all self care but unable to carry out any work activities. Up and about more than 50% of waking hours)  3 - Symptomatic, >50% in bed, but not bedbound (Capable of only limited self-care, confined to bed or chair 50% or more of waking hours)  4 - Bedbound (Completely disabled. Cannot carry on any self-care. Totally confined to bed or chair)  5 - Death   Eustace Pen MM, Creech RH, Tormey DC, et al. [phone]). "Toxicity and response criteria of the [facility] Group". [place] Oncol. 5 (6): 649-55    LABORATORY DATA:  Lab Results  Component Value Date   WBC 8.2 01/09/2019   HGB 10.9 (L) 01/09/2019   HCT 37.0 01/09/2019   MCV 83.0 01/09/2019   PLT 223 01/09/2019   Lab Results  Component Value Date   NA 136 01/09/2019   K 4.2 01/09/2019   CL 102 01/09/2019   CO2 26 01/09/2019   Lab Results  Component Value Date   ALT 20 01/09/2019   AST 30 01/09/2019   ALKPHOS 82 01/09/2019   BILITOT 1.2 01/09/2019      RADIOGRAPHY: Dg Chest 2 View  Result Date: 01/10/2019 CLINICAL DATA:  Chest tube removal EXAM: CHEST - 2 VIEW COMPARISON:  Yesterday FINDINGS: Right IJ line  with tip at the SVC. Stable chest wall emphysema on the right. No visible pneumothorax. Stable volume loss and asymmetric interstitial opacity on the right where there is still some pleural fluid that is small volume. Normal heart size. IMPRESSION: 1. No visible pneumothorax.  Chest wall emphysema is stable. 2. Unchanged volume loss and opacity on the right. Electronically Signed   By: Monte Fantasia M.D.   On: 01/10/2019 08:56   Dg Chest 2 View  Result Date: 01/09/2019 CLINICAL DATA:  Chest tube removal EXAM: CHEST - 2 VIEW COMPARISON:  01/09/2019 FINDINGS: Right IJ central venous catheter tip projects over the lower SVC. The right chest tube has been removed. No pneumothorax. Aeration of the right lung is improved. The left lung is clear. IMPRESSION: Removal of right chest tube without pneumothorax. Electronically Signed   By: Ulyses Jarred M.D.   On: 01/09/2019 14:00   Dg Chest 2 View  Result Date: 01/07/2019 CLINICAL DATA:  Preop for right lung mass biopsy EXAM: CHEST - 2 VIEW COMPARISON:  PET-CT 12/07/2018 FINDINGS: Normal heart size and mediastinal contours. Known, spiculated right-sided lung lesion which overlaps the right hilum on the frontal view and is subpleural on the lateral view. No edema, effusion, or pneumothorax. IMPRESSION: 1. No evidence of acute cardiopulmonary disease. 2. Known right lower lobe mass. Electronically Signed   By: Monte Fantasia M.D.   On: 01/07/2019 06:19   Dg Chest Port 1 View  Result Date: 01/09/2019 CLINICAL DATA:  Chest tube and status post lung surgery. EXAM: PORTABLE CHEST 1 VIEW COMPARISON:  01/08/2019 FINDINGS: Patient is rotated on the examination which limits evaluation of the right hemithorax. Right chest tube is still present. Negative for a large pneumothorax. Subcutaneous gas in the right lower chest. Right jugular central line tip in the lower SVC region. Volume loss in the right hemithorax with increased interstitial densities in the right lung. Left  lung remains clear. IMPRESSION: Increased interstitial densities in the right lung may be related to volume loss. Right chest tube is  present without a large pneumothorax. Limited evaluation of the right lung due to patient positioning/projection of the image. Electronically Signed   By: Markus Daft M.D.   On: 01/09/2019 09:16   Dg Chest Port 1 View  Result Date: 01/08/2019 CLINICAL DATA:  Lobectomy EXAM: PORTABLE CHEST 1 VIEW COMPARISON:  Yesterday FINDINGS: Postoperative chest with bilateral moderate atelectasis. Right-sided chest tube with no visible pneumothorax. Stable heart size displaced towards the right. Right IJ line with tip at the SVC. IMPRESSION: 1. Postoperative atelectasis. 2. No visible pneumothorax. Electronically Signed   By: Monte Fantasia M.D.   On: 01/08/2019 09:07   Dg Chest Port 1 View  Result Date: 01/07/2019 CLINICAL DATA:  Right lobectomy EXAM: PORTABLE CHEST 1 VIEW COMPARISON:  01/06/2017 FINDINGS: Right thoracotomy and lobectomy. Right chest tube in good position. No pneumothorax or significant effusion. Mild bibasilar atelectasis. Central venous catheter tip in the SVC at the cavoatrial junction. IMPRESSION: No pneumothorax post right thoracotomy. Hypoventilation with bibasilar atelectasis. Electronically Signed   By: Franchot Gallo M.D.   On: 01/07/2019 13:17       IMPRESSION/PLAN: 1. Stage IIIA, pT2N2, NSCLC, adenocarcinoma of the RLL. Dr. Lisbeth Renshaw discusses the pathology findings and reviews the nature of locally advanced lung cancer. He discusses the rationale for a combination of chemotherapy and radiotherapy to treat any microscopic disease and to reduce the risk of recurrence.  We discussed the risks, benefits, short, and long term effects of radiotherapy, and the patient is interested in proceeding. Dr. Lisbeth Renshaw discusses the delivery and logistics of radiotherapy and anticipates a course of  6 1/2 weeks of radiotherapy. We will await Dr. Collins Scotland recommendations on  starting treatment and discussed he may want to give chemotherapy alone followed by radiotherapy at completion, versus concurrent chemoRT. She is interested in proceeding once Dr. Julien Nordmann has weighed in. 2. 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 The patient will continue with her plans to complete her work up of this with Dr. Gloriann Loan. We will follow along expectantly.   3. History of cirrhosis and esophageal varices. The patient has done well. We discussed esophagitis being the most common side effect of radiotherapy so we will keep her GI providers aware of these plans as well.   This encounter was provided by telemedicine platform Webex.  The patient has given verbal consent for this type of encounter and has been advised to only accept a meeting of this type in a secure network environment. The time spent during this encounter was 45 minutes. The attendants for this meeting include Blenda Nicely, RN, Dr. Lisbeth Renshaw, Hayden Pedro  and [name].  During the encounter,  Blenda Nicely, RN, Dr. Lisbeth Renshaw, and Hayden Pedro were located at [facility] Radiation Oncology Department.  YOMARA TOOTHMAN was located at home.    The above documentation reflects my direct findings during this shared patient visit. Please see the separate note by Dr. Lisbeth Renshaw on this date for the remainder of the patient's plan of care.    Carola Rhine, PAC

## 2019-01-15 NOTE — Telephone Encounter (Signed)
Received a staff msg from Elizebeth Koller to schedule an appt for the pt to see Dr. Julien Nordmann on 5/28 at 3pm. Pt has been cld and made aware of appt w/Dr. Julien Nordmann as well as labs at 230pm.

## 2019-01-16 ENCOUNTER — Other Ambulatory Visit: Payer: Self-pay | Admitting: *Deleted

## 2019-01-16 DIAGNOSIS — C3491 Malignant neoplasm of unspecified part of right bronchus or lung: Secondary | ICD-10-CM

## 2019-01-17 ENCOUNTER — Inpatient Hospital Stay: Payer: Medicare Other | Attending: Internal Medicine | Admitting: Internal Medicine

## 2019-01-17 ENCOUNTER — Telehealth: Payer: Self-pay | Admitting: Radiation Oncology

## 2019-01-17 ENCOUNTER — Inpatient Hospital Stay: Payer: Medicare Other

## 2019-01-17 ENCOUNTER — Other Ambulatory Visit: Payer: Self-pay

## 2019-01-17 ENCOUNTER — Encounter: Payer: Self-pay | Admitting: Internal Medicine

## 2019-01-17 VITALS — BP 153/64 | HR 71 | Temp 97.8°F | Resp 20 | Ht 64.0 in | Wt 179.5 lb

## 2019-01-17 DIAGNOSIS — Z87891 Personal history of nicotine dependence: Secondary | ICD-10-CM | POA: Insufficient documentation

## 2019-01-17 DIAGNOSIS — E785 Hyperlipidemia, unspecified: Secondary | ICD-10-CM | POA: Insufficient documentation

## 2019-01-17 DIAGNOSIS — Z7984 Long term (current) use of oral hypoglycemic drugs: Secondary | ICD-10-CM | POA: Diagnosis not present

## 2019-01-17 DIAGNOSIS — E119 Type 2 diabetes mellitus without complications: Secondary | ICD-10-CM

## 2019-01-17 DIAGNOSIS — C3491 Malignant neoplasm of unspecified part of right bronchus or lung: Secondary | ICD-10-CM

## 2019-01-17 DIAGNOSIS — I10 Essential (primary) hypertension: Secondary | ICD-10-CM | POA: Insufficient documentation

## 2019-01-17 DIAGNOSIS — C779 Secondary and unspecified malignant neoplasm of lymph node, unspecified: Secondary | ICD-10-CM | POA: Insufficient documentation

## 2019-01-17 DIAGNOSIS — Z79899 Other long term (current) drug therapy: Secondary | ICD-10-CM | POA: Diagnosis not present

## 2019-01-17 DIAGNOSIS — Z820 Family history of epilepsy and other diseases of the nervous system: Secondary | ICD-10-CM | POA: Insufficient documentation

## 2019-01-17 DIAGNOSIS — R5383 Other fatigue: Secondary | ICD-10-CM | POA: Insufficient documentation

## 2019-01-17 DIAGNOSIS — C3431 Malignant neoplasm of lower lobe, right bronchus or lung: Secondary | ICD-10-CM | POA: Insufficient documentation

## 2019-01-17 DIAGNOSIS — Z5111 Encounter for antineoplastic chemotherapy: Secondary | ICD-10-CM | POA: Insufficient documentation

## 2019-01-17 DIAGNOSIS — C3490 Malignant neoplasm of unspecified part of unspecified bronchus or lung: Secondary | ICD-10-CM

## 2019-01-17 DIAGNOSIS — Z7189 Other specified counseling: Secondary | ICD-10-CM

## 2019-01-17 DIAGNOSIS — J449 Chronic obstructive pulmonary disease, unspecified: Secondary | ICD-10-CM

## 2019-01-17 DIAGNOSIS — Z806 Family history of leukemia: Secondary | ICD-10-CM

## 2019-01-17 DIAGNOSIS — K7030 Alcoholic cirrhosis of liver without ascites: Secondary | ICD-10-CM

## 2019-01-17 LAB — CBC WITH DIFFERENTIAL (CANCER CENTER ONLY)
Abs Immature Granulocytes: 0.02 10*3/uL (ref 0.00–0.07)
Basophils Absolute: 0.1 10*3/uL (ref 0.0–0.1)
Basophils Relative: 1 %
Eosinophils Absolute: 0.3 10*3/uL (ref 0.0–0.5)
Eosinophils Relative: 4 %
HCT: 35.9 % — ABNORMAL LOW (ref 36.0–46.0)
Hemoglobin: 10.6 g/dL — ABNORMAL LOW (ref 12.0–15.0)
Immature Granulocytes: 0 %
Lymphocytes Relative: 27 %
Lymphs Abs: 1.7 10*3/uL (ref 0.7–4.0)
MCH: 24.2 pg — ABNORMAL LOW (ref 26.0–34.0)
MCHC: 29.5 g/dL — ABNORMAL LOW (ref 30.0–36.0)
MCV: 82 fL (ref 80.0–100.0)
Monocytes Absolute: 0.7 10*3/uL (ref 0.1–1.0)
Monocytes Relative: 11 %
Neutro Abs: 3.5 10*3/uL (ref 1.7–7.7)
Neutrophils Relative %: 57 %
Platelet Count: 338 10*3/uL (ref 150–400)
RBC: 4.38 MIL/uL (ref 3.87–5.11)
RDW: 17.2 % — ABNORMAL HIGH (ref 11.5–15.5)
WBC Count: 6.2 10*3/uL (ref 4.0–10.5)
nRBC: 0 % (ref 0.0–0.2)

## 2019-01-17 LAB — CMP (CANCER CENTER ONLY)
ALT: 16 U/L (ref 0–44)
AST: 30 U/L (ref 15–41)
Albumin: 2.9 g/dL — ABNORMAL LOW (ref 3.5–5.0)
Alkaline Phosphatase: 93 U/L (ref 38–126)
Anion gap: 8 (ref 5–15)
BUN: 6 mg/dL — ABNORMAL LOW (ref 8–23)
CO2: 24 mmol/L (ref 22–32)
Calcium: 9 mg/dL (ref 8.9–10.3)
Chloride: 102 mmol/L (ref 98–111)
Creatinine: 0.76 mg/dL (ref 0.44–1.00)
GFR, Est AFR Am: >60 mL/min (ref >60)
GFR, Estimated: >60 mL/min (ref >60)
Glucose, Bld: 88 mg/dL (ref 70–99)
Potassium: 4.6 mmol/L (ref 3.5–5.1)
Sodium: 134 mmol/L — ABNORMAL LOW (ref 135–145)
Total Bilirubin: 0.9 mg/dL (ref 0.3–1.2)
Total Protein: 7.1 g/dL (ref 6.5–8.1)

## 2019-01-17 NOTE — Telephone Encounter (Signed)
I spoke with Dr. Julien Nordmann after he saw the patient. He is planning adjuvant chemotherapy, followed by XRT. She needs a brain MRI and I'll order that. I left her a message as well to make sure she knew we were holding on XRT until after chemo and then begin her XRT thereafter.

## 2019-01-17 NOTE — Progress Notes (Signed)
[place] [phone]   [phone]  CONSULT NOTE  REFERRING PHYSICIAN: Dr. Modesto Charon  REASON FOR CONSULTATION:  63 years old white female recently diagnosed with lung cancer.  HPI Sandy [name] is a 63 y.o. female with past medical history significant for osteoarthritis, COPD, liver cirrhosis, dyslipidemia, esophageal varices, hypertension, diabetes mellitus as well as long history for smoking but quit in 2015.  The patient was seen by her primary care physician in December 2019 and she had CT screening of the chest performed on July 27, 2018 and that showed 2.7 cm spiculated posterior right lower lobe nodule.  This was followed by CT scan of the chest on October 23, 2018 and it showed increase in the size of the superior segment right lower lobe pulmonary nodule which measured 2.94 cm.  The patient had a PET scan on December 07, 2018 and that showed hypermetabolic activity in the right lower lobe nodule with no evidence for distant metastases or thoracic hypermetabolic activity.  There was a indeterminate left renal lesion that could represent complex cyst or solid neoplasm and recommendation was to consider abdominal MRI for further evaluation.  She was referred to Dr. Roxan Hockey and on Jan 07, 2019 she underwent right lower lobe superior segment wedge resection, right lower lobectomy with mediastinal lymph node dissection.  The final pathology ([id number]) was consistent with well-differentiated adenocarcinoma measuring 4.0 cm.  There was evidence for metastatic carcinoma to lymph node at level 7 and 12R with the final pathological stage of T2 a, N2.  The patient was referred to me today for evaluation and recommendation regarding adjuvant treatment. When seen today she is feeling fine except for fatigue as well as soreness on the right side of the chest from the recent surgery.  She also has mild cough and shortness of breath with exertion but no  hemoptysis.  She denied having any nausea, vomiting, diarrhea or constipation.  She has no headache or visual changes. Family history significant for mother with leukemia and died at age 64 and father had parkinsonism. The patient is single and has 2 children.  She used to work as Scientist, forensic.  She has a history for smoking 1 pack/day for 30 years and quit in 2015.  She has a history of alcohol drinking but the patient claimed that it was not big amounts.  She has no history of drug abuse.   HPI  Past Medical History:  Diagnosis Date   Arthritis    Cirrhosis ([place])    COPD (chronic obstructive pulmonary disease) (HCC)    Dyslipidemia    Dyspnea    Esophageal varices ([facility])    History of blood transfusion    History of hiatal hernia    Hypertension    Pneumonia    Type 2 diabetes mellitus ([place])    Type II    Past Surgical History:  Procedure Laterality Date   ABDOMINAL HYSTERECTOMY     ANTERIOR CRUCIATE LIGAMENT REPAIR Left    x 2   ESOPHAGOGASTRODUODENOSCOPY     LOBECTOMY Right 01/07/2019   Procedure: RIGHT LOWER LOBE LOBECTOMY AND INTERCOSTAL NERVE BLOCK;  Surgeon: Melrose Nakayama, MD;  Location: [facility];  Service: Thoracic;  Laterality: Right;   VIDEO ASSISTED THORACOSCOPY (VATS)/WEDGE RESECTION Right 01/07/2019   Procedure: VIDEO ASSISTED THORACOSCOPY (VATS) RIGHT LOWER LOBE WEDGE RESECTION;  Surgeon: Melrose Nakayama, MD;  Location: [facility];  Service: Thoracic;  Laterality: Right;    Family History  Problem Relation Age of Onset   Leukemia Mother    Lung disease Neg Hx     Social History Social History   Tobacco Use   Smoking status: Former Smoker    Packs/day: 1.00    Years: 37.00    Pack years: 37.00    Last attempt to quit: 08/2014    Years since quitting: 4.4   Smokeless tobacco: Never Used  Substance Use Topics   Alcohol use: Yes    Alcohol/week: 1.0 standard drinks    Types: 1 Glasses of wine per week    Comment: every  blue moon   Drug use: Yes    Types: Marijuana    Allergies  Allergen Reactions   Codeine Nausea And Vomiting   Nsaids     Avoid due to esophageal varices    Current Outpatient Medications  Medication Sig Dispense Refill   acetaminophen (TYLENOL) 500 MG tablet Take 1,000 mg by mouth every 6 (six) hours as needed for moderate pain or headache.     atorvastatin (LIPITOR) 10 MG tablet Take 10 mg by mouth at bedtime.      diphenhydrAMINE (BENADRYL) 25 MG tablet Take 50 mg by mouth at bedtime as needed.     losartan (COZAAR) 50 MG tablet Take 1 tablet (50 mg total) by mouth daily. 30 tablet 3   metFORMIN (GLUCOPHAGE-XR) 500 MG 24 hr tablet Take 500 mg by mouth at bedtime.     nadolol (CORGARD) 40 MG tablet Take 40 mg by mouth at bedtime.     omeprazole (PRILOSEC) 20 MG capsule Take 20 mg by mouth daily.      ondansetron (ZOFRAN) 4 MG tablet Take 1 tablet (4 mg total) by mouth every 6 (six) hours as needed for nausea or vomiting. 20 tablet 0   Tetrahydrozoline HCl (VISINE OP) Place 1 drop into both eyes daily as needed (irritation).     traMADol (ULTRAM) 50 MG tablet Take 1-2 tablets (50-100 mg total) by mouth every 6 (six) hours as needed (mild pain). 30 tablet 0   No current facility-administered medications for this visit.     Review of Systems  Constitutional: positive for fatigue Eyes: negative Ears, nose, mouth, throat, and face: negative Respiratory: positive for cough, dyspnea on exertion and pleurisy/chest pain Cardiovascular: negative Gastrointestinal: negative Genitourinary:negative Integument/breast: negative Hematologic/lymphatic: negative Musculoskeletal:negative Neurological: negative Behavioral/Psych: negative Endocrine: negative Allergic/Immunologic: negative  Physical Exam  XBD:ZHGDJ, healthy, no distress, well nourished and well developed SKIN: skin color, texture, turgor are normal, no rashes or significant lesions HEAD: Normocephalic, No  masses, lesions, tenderness or abnormalities EYES: normal, PERRLA, Conjunctiva are pink and non-injected EARS: External ears normal, Canals clear OROPHARYNX:no exudate, no erythema and lips, buccal mucosa, and tongue normal  NECK: supple, no adenopathy, no JVD LYMPH:  no palpable lymphadenopathy, no hepatosplenomegaly BREAST:not examined LUNGS: clear to auscultation , and palpation HEART: regular rate & rhythm, no murmurs and no gallops ABDOMEN:abdomen soft, non-tender, normal bowel sounds and no masses or organomegaly BACK: No CVA tenderness, Range of motion is normal EXTREMITIES:no joint deformities, effusion, or inflammation, no edema  NEURO: alert & oriented x 3 with fluent speech, no focal motor/sensory deficits  PERFORMANCE STATUS: ECOG 1  LABORATORY DATA: Lab Results  Component Value Date   WBC 6.2 01/17/2019   HGB 10.6 (L) 01/17/2019   HCT 35.9 (L) 01/17/2019   MCV 82.0 01/17/2019   PLT 338 01/17/2019      Chemistry      Component Value Date/Time  NA 136 01/09/2019 0615   K 4.2 01/09/2019 0615   CL 102 01/09/2019 0615   CO2 26 01/09/2019 0615   BUN 7 (L) 01/09/2019 0615   CREATININE 0.70 01/09/2019 0615      Component Value Date/Time   CALCIUM 8.5 (L) 01/09/2019 0615   ALKPHOS 82 01/09/2019 0615   AST 30 01/09/2019 0615   ALT 20 01/09/2019 0615   BILITOT 1.2 01/09/2019 0615       RADIOGRAPHIC STUDIES: Dg Chest 2 View  Result Date: 01/10/2019 CLINICAL DATA:  Chest tube removal EXAM: CHEST - 2 VIEW COMPARISON:  Yesterday FINDINGS: Right IJ line with tip at the SVC. Stable chest wall emphysema on the right. No visible pneumothorax. Stable volume loss and asymmetric interstitial opacity on the right where there is still some pleural fluid that is small volume. Normal heart size. IMPRESSION: 1. No visible pneumothorax.  Chest wall emphysema is stable. 2. Unchanged volume loss and opacity on the right. Electronically Signed   By: Monte Fantasia M.D.   On:  01/10/2019 08:56   Dg Chest 2 View  Result Date: 01/09/2019 CLINICAL DATA:  Chest tube removal EXAM: CHEST - 2 VIEW COMPARISON:  01/09/2019 FINDINGS: Right IJ central venous catheter tip projects over the lower SVC. The right chest tube has been removed. No pneumothorax. Aeration of the right lung is improved. The left lung is clear. IMPRESSION: Removal of right chest tube without pneumothorax. Electronically Signed   By: Ulyses Jarred M.D.   On: 01/09/2019 14:00   Dg Chest 2 View  Result Date: 01/07/2019 CLINICAL DATA:  Preop for right lung mass biopsy EXAM: CHEST - 2 VIEW COMPARISON:  PET-CT 12/07/2018 FINDINGS: Normal heart size and mediastinal contours. Known, spiculated right-sided lung lesion which overlaps the right hilum on the frontal view and is subpleural on the lateral view. No edema, effusion, or pneumothorax. IMPRESSION: 1. No evidence of acute cardiopulmonary disease. 2. Known right lower lobe mass. Electronically Signed   By: Monte Fantasia M.D.   On: 01/07/2019 06:19   Dg Chest Port 1 View  Result Date: 01/09/2019 CLINICAL DATA:  Chest tube and status post lung surgery. EXAM: PORTABLE CHEST 1 VIEW COMPARISON:  01/08/2019 FINDINGS: Patient is rotated on the examination which limits evaluation of the right hemithorax. Right chest tube is still present. Negative for a large pneumothorax. Subcutaneous gas in the right lower chest. Right jugular central line tip in the lower SVC region. Volume loss in the right hemithorax with increased interstitial densities in the right lung. Left lung remains clear. IMPRESSION: Increased interstitial densities in the right lung may be related to volume loss. Right chest tube is present without a large pneumothorax. Limited evaluation of the right lung due to patient positioning/projection of the image. Electronically Signed   By: Markus Daft M.D.   On: 01/09/2019 09:16   Dg Chest Port 1 View  Result Date: 01/08/2019 CLINICAL DATA:  Lobectomy EXAM:  PORTABLE CHEST 1 VIEW COMPARISON:  Yesterday FINDINGS: Postoperative chest with bilateral moderate atelectasis. Right-sided chest tube with no visible pneumothorax. Stable heart size displaced towards the right. Right IJ line with tip at the SVC. IMPRESSION: 1. Postoperative atelectasis. 2. No visible pneumothorax. Electronically Signed   By: Monte Fantasia M.D.   On: 01/08/2019 09:07   Dg Chest Port 1 View  Result Date: 01/07/2019 CLINICAL DATA:  Right lobectomy EXAM: PORTABLE CHEST 1 VIEW COMPARISON:  01/06/2017 FINDINGS: Right thoracotomy and lobectomy. Right chest tube in good position. No  pneumothorax or significant effusion. Mild bibasilar atelectasis. Central venous catheter tip in the SVC at the cavoatrial junction. IMPRESSION: No pneumothorax post right thoracotomy. Hypoventilation with bibasilar atelectasis. Electronically Signed   By: Franchot Gallo M.D.   On: 01/07/2019 13:17    ASSESSMENT: This is a very pleasant 63 years old white female recently diagnosed with stage IIIA (T2a, N2, M0) non-small cell lung cancer, adenocarcinoma presented with right lower lobe lung mass and mediastinal lymphadenopathy status post right lower lobectomy with lymph node dissection on Jan 07, 2019 under the care of Dr. Roxan Hockey.  PLAN:I had a lengthy discussion with the patient today about her current disease stage, prognosis and treatment options.  I recommended for the patient to complete the staging work-up by ordering MRI of the brain to rule out brain metastasis. I discussed with the patient the adjuvant treatment options and I recommended for her adjuvant systemic chemotherapy with 4 cycles of platinum based regimen with cisplatin 75 mg/M2 and Alimta 500 mg/M2 every 3 weeks. This will be followed by adjuvant radiotherapy to the mediastinum. I recommended for the patient to start the first cycle of her treatment within 4 to 6 weeks from the surgical resection time. I will bring her back for follow-up  visit in around discussion of her as well as the adverse effects. For the suspicious renal lesions, the patient was seen by urology and she is scheduled to have MRI of the abdomen on January 31, 2019. The patient was advised to call immediately if she has any other concerning symptoms The patient voices understanding of current disease status and treatment options and is in agreement with the current care plan. All questions were answered. The patient knows to call the clinic with any problems, questions or concerns. We can certainly see the patient much sooner if necessary.  Thank you so much for allowing me to participate in the care of Sandy [name]. I will continue to follow up the patient with you and assist in her care.  I spent 55 minutes counseling the patient face to face. The total time spent in the appointment was 80 minutes.  Disclaimer: This note was dictated with voice recognition software. Similar sounding words can inadvertently be transcribed and may not be corrected upon review.   Eilleen Kempf Jan 17, 2019, 3:14 PM

## 2019-01-18 ENCOUNTER — Telehealth: Payer: Self-pay | Admitting: Internal Medicine

## 2019-01-18 ENCOUNTER — Encounter (HOSPITAL_COMMUNITY): Payer: Self-pay | Admitting: Internal Medicine

## 2019-01-18 NOTE — Telephone Encounter (Signed)
Scheduled appt per 5/28 los - unable to reach patient , left message for patient with appt date and time

## 2019-01-21 ENCOUNTER — Other Ambulatory Visit: Payer: Self-pay

## 2019-01-21 ENCOUNTER — Other Ambulatory Visit: Payer: Self-pay | Admitting: Thoracic Surgery (Cardiothoracic Vascular Surgery)

## 2019-01-21 DIAGNOSIS — C3491 Malignant neoplasm of unspecified part of right bronchus or lung: Secondary | ICD-10-CM

## 2019-01-22 ENCOUNTER — Encounter: Payer: Self-pay | Admitting: Thoracic Surgery (Cardiothoracic Vascular Surgery)

## 2019-01-22 ENCOUNTER — Ambulatory Visit (INDEPENDENT_AMBULATORY_CARE_PROVIDER_SITE_OTHER): Payer: Self-pay | Admitting: Thoracic Surgery (Cardiothoracic Vascular Surgery)

## 2019-01-22 ENCOUNTER — Ambulatory Visit
Admission: RE | Admit: 2019-01-22 | Discharge: 2019-01-22 | Disposition: A | Discharge status: Home / Self Care | Payer: Medicare Other | Source: Ambulatory Visit | Attending: Thoracic Surgery (Cardiothoracic Vascular Surgery) | Admitting: Thoracic Surgery (Cardiothoracic Vascular Surgery)

## 2019-01-22 ENCOUNTER — Encounter (HOSPITAL_COMMUNITY): Payer: Self-pay | Admitting: Internal Medicine

## 2019-01-22 VITALS — BP 155/78 | HR 66 | Temp 97.9°F | Resp 16 | Ht 64.0 in | Wt 171.0 lb

## 2019-01-22 DIAGNOSIS — C3491 Malignant neoplasm of unspecified part of right bronchus or lung: Secondary | ICD-10-CM

## 2019-01-22 DIAGNOSIS — Z902 Acquired absence of lung [part of]: Secondary | ICD-10-CM

## 2019-01-22 MED ORDER — ONDANSETRON HCL 4 MG PO TABS: 4.0000 mg | ORAL_TABLET | Freq: Four times a day (QID) | ORAL | 0 refills | Status: DC | PRN

## 2019-01-22 MED ORDER — TRAMADOL HCL 50 MG PO TABS: 50.0000 mg | ORAL_TABLET | Freq: Two times a day (BID) | ORAL | 0 refills | Status: DC | PRN

## 2019-01-22 NOTE — Progress Notes (Signed)
[address]       [place] [address]             [phone]     HPI: Mrs. Sandy [name] returns for a scheduled follow-up visit  Sandy [name] is a 63 year old woman with a history of tobacco abuse, type 2 diabetes, cirrhosis, varices, hypertension, hyperlipidemia, and arthritis.  She was found to have a nodule on a low-dose CT for lung cancer screening.  Repeat CT showed an increase in size of the nodule.  On PET CT the nodule was hypermetabolic with no evidence of metastasis.  I did a thoracoscopic right lower lobectomy on 01/07/2019.  Her postoperative course was uncomplicated and she went home on day 3.  She took tramadol initially but ran out and has not taking it recently.  She is requesting a refill on that medication.  She really only uses it at night before she goes to bed.  She has not been exercising on a regular basis but has been doing some light housework.  She has seen Dr. Julien Nordmann in radiation oncology.  She will have chemo followed by radiation.  Past Medical History:  Diagnosis Date  . Arthritis   . Cirrhosis ([place])   . COPD (chronic obstructive pulmonary disease) ([place])   . Dyslipidemia   . Dyspnea   . Esophageal varices ([place])   . History of blood transfusion   . History of hiatal hernia   . Hypertension   . Pneumonia   . Type 2 diabetes mellitus ([facility])    Type II    Current Outpatient Medications  Medication Sig Dispense Refill  . acetaminophen (TYLENOL) 500 MG tablet Take 1,000 mg by mouth every 6 (six) hours as needed for moderate pain or headache.    Marland Kitchen atorvastatin (LIPITOR) 10 MG tablet Take 10 mg by mouth at bedtime.     . diphenhydrAMINE (BENADRYL) 25 MG tablet Take 50 mg by mouth at bedtime as needed.    Marland Kitchen losartan (COZAAR) 50 MG tablet Take 1 tablet (50 mg total) by mouth daily. 30 tablet 3  . metFORMIN (GLUCOPHAGE-XR) 500 MG 24 hr tablet Take 500 mg by mouth at bedtime.    . nadolol (CORGARD) 40 MG tablet Take 40 mg by mouth at bedtime.     Marland Kitchen omeprazole (PRILOSEC) 20 MG capsule Take 20 mg by mouth daily.     . Tetrahydrozoline HCl (VISINE OP) Place 1 drop into both eyes daily as needed (irritation).    . ondansetron (ZOFRAN) 4 MG tablet Take 1 tablet (4 mg total) by mouth every 6 (six) hours as needed for nausea or vomiting. 20 tablet 0  . traMADol (ULTRAM) 50 MG tablet Take 1 tablet (50 mg total) by mouth every 12 (twelve) hours as needed. 20 tablet 0   No current facility-administered medications for this visit.     Physical Exam BP (!) 155/78 (BP Location: Left Arm, Patient Position: Sitting, Cuff Size: Normal)   Pulse 66   Temp 97.9 F (36.6 C) (Skin)   Resp 16   Ht 5\' 4"  (1.626 m)   Wt 171 lb (77.6 kg)   SpO2 94% Comment: RA  BMI 29.9 kg/m  63 year old woman in no acute distress Alert and oriented x3 with no focal deficits Lungs diminished at right base, otherwise clear Incisions well-healed Cardiac regular rate and rhythm Extremities are without clubbing cyanosis or edema  Diagnostic Tests: CHEST - 2 VIEW  COMPARISON:  Jan 10, 2019 chest  radiograph; chest CT October 23, 2018  FINDINGS: There are postoperative changes on the right with small right pleural effusion. There are areas of volume loss on the right, at least in part due to postoperative change. The left lung is clear. The heart size and pulmonary vascularity are within normal limits. No adenopathy. No bone lesions.  IMPRESSION: Postoperative change with volume loss on the right. Small right pleural effusion. Left lung clear. Heart size within normal limits. No evident adenopathy. No recurrent mass is demonstrable by radiography.   Electronically Signed   By: Lowella Grip III M.D.   On: 01/22/2019 10:44  I personally reviewed the chest x-ray and concur with the findings noted above  Impression: Sandy [name] is a 63 year old woman with history of tobacco abuse who was found to have a lung nodule on a low-dose screening CT.   This was a T2 N0 M0 lesion by CT and PET.  She underwent a thoracoscopic right lower lobectomy on 01/07/2019.  Her postoperative course was uncomplicated and she went home on day 3.  Unfortunately she did have some microscopic nodal metastases and her pathologic stage was T2, N2, M0, stage IIIa.  Overall she is doing well from a surgical standpoint.  She does have some incisional pain.  She was given a prescription for an additional 20 tramadol tablets 50 mg p.o. every 6 hours as needed, no refills.  I also gave her an additional prescription for Zofran as she has nausea with essentially all pain medications.  There are no restrictions on her activities, but she was advised to build into new activities gradually.  She may drive with appropriate precautions.  Plan: MR brain Follow-up with Dr. Julien Nordmann and Sandy [name] as scheduled I will see her back in 2 months to check on her progress  Melrose Nakayama, MD [facility] [phone]

## 2019-01-23 ENCOUNTER — Telehealth: Payer: Self-pay | Admitting: *Deleted

## 2019-01-23 NOTE — Telephone Encounter (Signed)
CALLED PATIENT TO INFORM OF MRI FOR 01-25-19 - ARRIVAL TIME- 7:30 AM @ [facility], PATIENT IS AWARE OF THIS TEST

## 2019-01-24 ENCOUNTER — Ambulatory Visit: Payer: Medicare Other | Admitting: Radiation Oncology

## 2019-01-25 ENCOUNTER — Ambulatory Visit (HOSPITAL_COMMUNITY): Admission: RE | Admit: 2019-01-25 | Payer: Medicare Other | Source: Ambulatory Visit

## 2019-01-28 ENCOUNTER — Other Ambulatory Visit: Payer: Self-pay

## 2019-01-28 ENCOUNTER — Ambulatory Visit (HOSPITAL_COMMUNITY)
Admission: RE | Admit: 2019-01-28 | Discharge: 2019-01-28 | Disposition: A | Discharge status: Home / Self Care | Payer: Medicare Other | Source: Ambulatory Visit | Attending: Radiation Oncology | Admitting: Radiation Oncology

## 2019-01-28 DIAGNOSIS — C3490 Malignant neoplasm of unspecified part of unspecified bronchus or lung: Secondary | ICD-10-CM | POA: Diagnosis present

## 2019-01-28 MED ORDER — GADOBUTROL 1 MMOL/ML IV SOLN
7.0000 mL | Freq: Once | INTRAVENOUS | Status: AC | PRN
  Administered 2019-01-28: 7 mL via INTRAVENOUS

## 2019-01-29 ENCOUNTER — Telehealth: Payer: Self-pay | Admitting: Radiation Oncology

## 2019-01-29 NOTE — Telephone Encounter (Signed)
I called the patient and LM to let her know her MRI of the brain was negative for malignant change. We will see her after she completes her chemotherapy.

## 2019-01-31 ENCOUNTER — Other Ambulatory Visit: Payer: Self-pay

## 2019-01-31 ENCOUNTER — Ambulatory Visit
Admission: RE | Admit: 2019-01-31 | Discharge: 2019-01-31 | Disposition: A | Discharge status: Home / Self Care | Payer: Medicare Other | Source: Ambulatory Visit | Attending: Urology | Admitting: Urology

## 2019-01-31 DIAGNOSIS — D4102 Neoplasm of uncertain behavior of left kidney: Secondary | ICD-10-CM

## 2019-01-31 MED ORDER — GADOBENATE DIMEGLUMINE 529 MG/ML IV SOLN
16.0000 mL | Freq: Once | INTRAVENOUS | Status: AC | PRN
  Administered 2019-01-31: 16 mL via INTRAVENOUS

## 2019-02-07 ENCOUNTER — Other Ambulatory Visit: Payer: Medicare Other

## 2019-02-07 ENCOUNTER — Ambulatory Visit: Payer: Medicare Other | Admitting: Physician Assistant

## 2019-02-12 ENCOUNTER — Other Ambulatory Visit: Payer: Self-pay | Admitting: Internal Medicine

## 2019-02-12 ENCOUNTER — Encounter: Payer: Self-pay | Admitting: Physician Assistant

## 2019-02-12 ENCOUNTER — Other Ambulatory Visit: Payer: Self-pay

## 2019-02-12 ENCOUNTER — Inpatient Hospital Stay: Payer: Medicare Other | Attending: Internal Medicine

## 2019-02-12 ENCOUNTER — Inpatient Hospital Stay (HOSPITAL_BASED_OUTPATIENT_CLINIC_OR_DEPARTMENT_OTHER): Payer: Medicare Other | Admitting: Physician Assistant

## 2019-02-12 ENCOUNTER — Telehealth: Payer: Self-pay | Admitting: Internal Medicine

## 2019-02-12 VITALS — BP 169/72 | HR 72 | Temp 98.7°F | Resp 18 | Ht 64.0 in | Wt 171.8 lb

## 2019-02-12 DIAGNOSIS — Z5111 Encounter for antineoplastic chemotherapy: Secondary | ICD-10-CM | POA: Diagnosis not present

## 2019-02-12 DIAGNOSIS — D509 Iron deficiency anemia, unspecified: Secondary | ICD-10-CM | POA: Insufficient documentation

## 2019-02-12 DIAGNOSIS — C3431 Malignant neoplasm of lower lobe, right bronchus or lung: Secondary | ICD-10-CM | POA: Insufficient documentation

## 2019-02-12 DIAGNOSIS — Z79899 Other long term (current) drug therapy: Secondary | ICD-10-CM | POA: Insufficient documentation

## 2019-02-12 DIAGNOSIS — I10 Essential (primary) hypertension: Secondary | ICD-10-CM | POA: Diagnosis not present

## 2019-02-12 DIAGNOSIS — Z902 Acquired absence of lung [part of]: Secondary | ICD-10-CM

## 2019-02-12 DIAGNOSIS — C3491 Malignant neoplasm of unspecified part of right bronchus or lung: Secondary | ICD-10-CM

## 2019-02-12 DIAGNOSIS — C3411 Malignant neoplasm of upper lobe, right bronchus or lung: Secondary | ICD-10-CM

## 2019-02-12 DIAGNOSIS — D649 Anemia, unspecified: Secondary | ICD-10-CM | POA: Insufficient documentation

## 2019-02-12 DIAGNOSIS — Z7189 Other specified counseling: Secondary | ICD-10-CM

## 2019-02-12 LAB — CMP (CANCER CENTER ONLY)
ALT: 24 U/L (ref 0–44)
AST: 38 U/L (ref 15–41)
Albumin: 3.7 g/dL (ref 3.5–5.0)
Alkaline Phosphatase: 98 U/L (ref 38–126)
Anion gap: 10 (ref 5–15)
BUN: 7 mg/dL — ABNORMAL LOW (ref 8–23)
CO2: 24 mmol/L (ref 22–32)
Calcium: 9.8 mg/dL (ref 8.9–10.3)
Chloride: 106 mmol/L (ref 98–111)
Creatinine: 0.97 mg/dL (ref 0.44–1.00)
GFR, Est AFR Am: >60 mL/min (ref >60)
GFR, Estimated: >60 mL/min (ref >60)
Glucose, Bld: 131 mg/dL — ABNORMAL HIGH (ref 70–99)
Potassium: 4.6 mmol/L (ref 3.5–5.1)
Sodium: 140 mmol/L (ref 135–145)
Total Bilirubin: 0.9 mg/dL (ref 0.3–1.2)
Total Protein: 7.9 g/dL (ref 6.5–8.1)

## 2019-02-12 LAB — CBC WITH DIFFERENTIAL (CANCER CENTER ONLY)
Abs Immature Granulocytes: 0 10*3/uL (ref 0.00–0.07)
Basophils Absolute: 0 10*3/uL (ref 0.0–0.1)
Basophils Relative: 1 %
Eosinophils Absolute: 0.3 10*3/uL (ref 0.0–0.5)
Eosinophils Relative: 7 %
HCT: 38.5 % (ref 36.0–46.0)
Hemoglobin: 11.5 g/dL — ABNORMAL LOW (ref 12.0–15.0)
Immature Granulocytes: 0 %
Lymphocytes Relative: 33 %
Lymphs Abs: 1.6 10*3/uL (ref 0.7–4.0)
MCH: 23.8 pg — ABNORMAL LOW (ref 26.0–34.0)
MCHC: 29.9 g/dL — ABNORMAL LOW (ref 30.0–36.0)
MCV: 79.5 fL — ABNORMAL LOW (ref 80.0–100.0)
Monocytes Absolute: 0.4 10*3/uL (ref 0.1–1.0)
Monocytes Relative: 9 %
Neutro Abs: 2.4 10*3/uL (ref 1.7–7.7)
Neutrophils Relative %: 50 %
Platelet Count: 251 10*3/uL (ref 150–400)
RBC: 4.84 MIL/uL (ref 3.87–5.11)
RDW: 16.3 % — ABNORMAL HIGH (ref 11.5–15.5)
WBC Count: 4.7 10*3/uL (ref 4.0–10.5)
nRBC: 0 % (ref 0.0–0.2)

## 2019-02-12 MED ORDER — PROCHLORPERAZINE MALEATE 10 MG PO TABS: 10.0000 mg | ORAL_TABLET | Freq: Four times a day (QID) | ORAL | 0 refills | Status: DC | PRN

## 2019-02-12 MED ORDER — DEXAMETHASONE 4 MG PO TABS: ORAL_TABLET | ORAL | 0 refills | Status: DC

## 2019-02-12 MED ORDER — FOLIC ACID 1 MG PO TABS: 1.0000 mg | ORAL_TABLET | Freq: Every day | ORAL | 2 refills | Status: DC

## 2019-02-12 MED ORDER — CYANOCOBALAMIN 1000 MCG/ML IJ SOLN
1000.0000 ug | Freq: Once | INTRAMUSCULAR | Status: AC
  Administered 2019-02-12: 1000 ug via INTRAMUSCULAR

## 2019-02-12 MED ORDER — CYANOCOBALAMIN 1000 MCG/ML IJ SOLN
INTRAMUSCULAR | Status: AC
  Filled 2019-02-12: qty 1

## 2019-02-12 NOTE — Patient Instructions (Addendum)
-  Compazine is for nausea. You may take it once every 6 hours as needed -B12 shot today and every 9 weeks -Chemo education class before treatment -Folic acid one tablet every day -Take 1 iron a day. May take slow release to prevent GI side effects.  -Weekly labs -Treatment will be one day every 3 weeks for a total of 4 treatments. The chemotherapy medications that you are receiving are called Cisplatin and Alimta (pemetrexed) -Decadron (steriod) take 1 tablet twice a day the day before, the day of, and the day after treatment.

## 2019-02-12 NOTE — Telephone Encounter (Signed)
First treatment for 6/30 not scheduled. Will scheduled once added on in infusion. Advised patient. Gave avs and calendar

## 2019-02-12 NOTE — Progress Notes (Signed)
START ON PATHWAY REGIMEN - Non-Small Cell Lung     A cycle is every 21 days:     Pemetrexed      Cisplatin   **Always confirm dose/schedule in your pharmacy ordering system**  Patient Characteristics: Stage IIB - III - Resected (Adjuvant), No Prior Chemotherapy, Nonsquamous Cell AJCC T Category: T2a Current Disease Status: No Distant Mets or Local Recurrence AJCC N Category: N2 AJCC M Category: M0 AJCC 8 Stage Grouping: IIIA Histology: Nonsquamous Cell Intent of Therapy: Curative Intent, Discussed with Patient

## 2019-02-12 NOTE — Progress Notes (Signed)
[facility] OFFICE PROGRESS NOTE  Sandy Downing, MD [name] [address]  DIAGNOSIS: stage IIIA (T2a, N2, M0) non-small cell lung cancer, adenocarcinoma presented with right lower lobe lung mass and mediastinal lymphadenopathy. Diagnosed in May 2020.   Biomarker Findings Microsatellite status - MS-Stable Tumor Mutational Burden - 9 Muts/Mb Genomic Findings For a complete list of the genes assayed, please refer to the Appendix. NF1 K33* TP53 R158P 8 Disease relevant genes with no reportable alterations: ALK, BRAF, EGFR, ERBB2, KRAS, MET, RET, ROS1  PDL 1 expression negative  PRIOR THERAPY:  Status post right lower lobectomy with lymph node dissection on Jan 07, 2019 under the care of Dr. Roxan Hockey.  CURRENT THERAPY: Adjuvant platinum based regimen with cisplatin 75 mg/M2 and Alimta 500 mg/M2 every 3 weeks. First dose expected on June 30th, 2020.  INTERVAL HISTORY: Sandy [name] 63 y.o. female returns to the clinic for a follow-up visit. The patient had a right lower lobectomy with lymph node dissection on May 18th, 2020. The patient is feeling well except continues to experience residual soreness/sharp pain in her right chest wall from the surgery. Otherwise, she is feeling well but endorses her mild baseline cough and shortness of breath with exertion. She denies any fever, chills, night sweats, or weight loss. She denies any hemoptysis.  She denies any nausea, vomiting, diarrhea, or constipation.  She denies any headache or visual changes. The patient recently had a staging brain MRI. She is here today for evaluation, review her scans, and to discuss her treatment options.   MEDICAL HISTORY: Past Medical History:  Diagnosis Date  . Arthritis   . Cirrhosis ([place])   . COPD (chronic obstructive pulmonary disease) ([facility])   . Dyslipidemia   . Dyspnea   . Esophageal varices ([place])   . History of blood transfusion   . History of hiatal  hernia   . Hypertension   . Pneumonia   . Type 2 diabetes mellitus ([facility])    Type II    ALLERGIES:  is allergic to codeine and nsaids.  MEDICATIONS:  Current Outpatient Medications  Medication Sig Dispense Refill  . acetaminophen (TYLENOL) 500 MG tablet Take 1,000 mg by mouth every 6 (six) hours as needed for moderate pain or headache.    Marland Kitchen atorvastatin (LIPITOR) 10 MG tablet Take 10 mg by mouth at bedtime.     . diphenhydrAMINE (BENADRYL) 25 MG tablet Take 50 mg by mouth at bedtime as needed.    Marland Kitchen losartan (COZAAR) 50 MG tablet Take 1 tablet (50 mg total) by mouth daily. 30 tablet 3  . metFORMIN (GLUCOPHAGE-XR) 500 MG 24 hr tablet Take 500 mg by mouth at bedtime.    . nadolol (CORGARD) 40 MG tablet Take 40 mg by mouth at bedtime.    Marland Kitchen omeprazole (PRILOSEC) 20 MG capsule Take 20 mg by mouth daily.     . Tetrahydrozoline HCl (VISINE OP) Place 1 drop into both eyes daily as needed (irritation).    Marland Kitchen dexamethasone (DECADRON) 4 MG tablet Take 1 (4 mg) tablet twice a day the day before, the day of, and the day after chemotherapy 24 tablet 0  . folic acid (FOLVITE) 1 MG tablet Take 1 tablet (1 mg total) by mouth daily. 30 tablet 2  . ondansetron (ZOFRAN) 4 MG tablet Take 1 tablet (4 mg total) by mouth every 6 (six) hours as needed for nausea or vomiting. (Patient not taking: Reported on 02/12/2019) 20 tablet 0  .  prochlorperazine (COMPAZINE) 10 MG tablet Take 1 tablet (10 mg total) by mouth every 6 (six) hours as needed for nausea or vomiting. 30 tablet 0  . traMADol (ULTRAM) 50 MG tablet Take 1 tablet (50 mg total) by mouth every 12 (twelve) hours as needed. (Patient not taking: Reported on 02/12/2019) 20 tablet 0   No current facility-administered medications for this visit.     SURGICAL HISTORY:  Past Surgical History:  Procedure Laterality Date  . ABDOMINAL HYSTERECTOMY    . ANTERIOR CRUCIATE LIGAMENT REPAIR Left    x 2  . ESOPHAGOGASTRODUODENOSCOPY    . LOBECTOMY Right 01/07/2019    Procedure: RIGHT LOWER LOBE LOBECTOMY AND INTERCOSTAL NERVE BLOCK;  Surgeon: Melrose Nakayama, MD;  Location: [facility];  Service: Thoracic;  Laterality: Right;  Marland Kitchen VIDEO ASSISTED THORACOSCOPY (VATS)/WEDGE RESECTION Right 01/07/2019   Procedure: VIDEO ASSISTED THORACOSCOPY (VATS) RIGHT LOWER LOBE WEDGE RESECTION;  Surgeon: Melrose Nakayama, MD;  Location: [facility];  Service: Thoracic;  Laterality: Right;    REVIEW OF SYSTEMS:   Review of Systems  Constitutional: Negative for appetite change, chills, fatigue, fever and unexpected weight change.  HENT:   Negative for mouth sores, nosebleeds, sore throat and trouble swallowing.   Eyes: Negative for eye problems and icterus.  Respiratory: Negative for cough, hemoptysis, shortness of breath and wheezing.   Cardiovascular: Positive for a sharp chest pain at her surgical site. Negative for leg swelling.  Gastrointestinal: Negative for abdominal pain, constipation, diarrhea, nausea and vomiting.  Genitourinary: Negative for bladder incontinence, difficulty urinating, dysuria, frequency and hematuria.   Musculoskeletal: Negative for back pain, gait problem, neck pain and neck stiffness.  Skin: Negative for itching and rash.  Neurological: Negative for dizziness, extremity weakness, gait problem, headaches, light-headedness and seizures.  Hematological: Negative for adenopathy. Does not bruise/bleed easily.  Psychiatric/Behavioral: Negative for confusion, depression and sleep disturbance. The patient is not nervous/anxious.     PHYSICAL EXAMINATION:  Blood pressure (!) 169/72, pulse 72, temperature 98.7 F (37.1 C), temperature source Temporal, resp. rate 18, height 5' 4"  (1.626 m), weight 171 lb 12.8 oz (77.9 kg), SpO2 96 %.  ECOG PERFORMANCE STATUS: 1 - Symptomatic but completely ambulatory  Physical Exam  Constitutional: Oriented to person, place, and time and well-developed, well-nourished, and in no distress.  HENT:  Head: Normocephalic and  atraumatic.  Mouth/Throat: Oropharynx is clear and moist. No oropharyngeal exudate.  Eyes: Conjunctivae are normal. Right eye exhibits no discharge. Left eye exhibits no discharge. No scleral icterus.  Neck: Normal range of motion. Neck supple.  Cardiovascular: Normal rate, regular rhythm, normal heart sounds and intact distal pulses.   Pulmonary/Chest: Effort normal and breath sounds normal. No respiratory distress. No wheezes. No rales.  Abdominal: Soft. Bowel sounds are normal. Exhibits no distension and no mass. There is no tenderness.  Musculoskeletal: Normal range of motion. Exhibits no edema.  Lymphadenopathy:    No cervical adenopathy.  Neurological: Alert and oriented to person, place, and time. Exhibits normal muscle tone. Gait normal. Coordination normal.  Skin: Skin is warm and dry. No rash noted. Not diaphoretic. No erythema. No pallor.  Psychiatric: Mood, memory and judgment normal.  Vitals reviewed.  LABORATORY DATA: Lab Results  Component Value Date   WBC 4.7 02/12/2019   HGB 11.5 (L) 02/12/2019   HCT 38.5 02/12/2019   MCV 79.5 (L) 02/12/2019   PLT 251 02/12/2019      Chemistry      Component Value Date/Time   NA 140 02/12/2019  0911   K 4.6 02/12/2019 0911   CL 106 02/12/2019 0911   CO2 24 02/12/2019 0911   BUN 7 (L) 02/12/2019 0911   CREATININE 0.97 02/12/2019 0911      Component Value Date/Time   CALCIUM 9.8 02/12/2019 0911   ALKPHOS 98 02/12/2019 0911   AST 38 02/12/2019 0911   ALT 24 02/12/2019 0911   BILITOT 0.9 02/12/2019 0911       RADIOGRAPHIC STUDIES:  Dg Chest 2 View  Result Date: 01/22/2019 CLINICAL DATA:  Lung carcinoma, status post VATS procedure. Shortness of breath. EXAM: CHEST - 2 VIEW COMPARISON:  Jan 10, 2019 chest radiograph; chest CT October 23, 2018 FINDINGS: There are postoperative changes on the right with small right pleural effusion. There are areas of volume loss on the right, at least in part due to postoperative change. The left  lung is clear. The heart size and pulmonary vascularity are within normal limits. No adenopathy. No bone lesions. IMPRESSION: Postoperative change with volume loss on the right. Small right pleural effusion. Left lung clear. Heart size within normal limits. No evident adenopathy. No recurrent mass is demonstrable by radiography. Electronically Signed   By: Lowella Grip III M.D.   On: 01/22/2019 10:44   Mr Jeri Cos EX Contrast  Result Date: 01/29/2019 CLINICAL DATA:  Non-small cell lung cancer.  Staging. EXAM: MRI HEAD WITHOUT AND WITH CONTRAST TECHNIQUE: Multiplanar, multiecho pulse sequences of the brain and surrounding structures were obtained without and with intravenous contrast. CONTRAST:  Gadavist 7 mL. COMPARISON:  None. FINDINGS: Brain: No acute infarction, hemorrhage, hydrocephalus, extra-axial collection or mass lesion. Slight premature cerebral and cerebellar atrophy. Mild subcortical and periventricular T2 and FLAIR hyperintensities, likely chronic microvascular ischemic change. Post infusion, no abnormal enhancement of the brain or meninges. Vascular: Normal flow voids. Skull and upper cervical spine: Normal marrow signal. Sinuses/Orbits: Negative. Other: None. IMPRESSION: Mild atrophy. Mild small vessel disease. No acute intracranial findings. No intracranial metastatic disease is observed. Electronically Signed   By: Staci Righter M.D.   On: 01/29/2019 09:16   Mr Abdomen Wwo Contrast  Result Date: 01/31/2019 CLINICAL DATA:  Follow-up left kidney lesion. EXAM: MRI ABDOMEN WITHOUT AND WITH CONTRAST TECHNIQUE: Multiplanar multisequence MR imaging of the abdomen was performed both before and after the administration of intravenous contrast. CONTRAST:  39m MULTIHANCE GADOBENATE DIMEGLUMINE 529 MG/ML IV SOLN COMPARISON:  12/07/2018 FINDINGS: Lower chest: Right pleural effusion identified. Hepatobiliary: Liver has a macronodular contour with hypertrophy of the lateral segment of left lobe of  liver compatible with cirrhosis. Hepatic steatosis noted. In the lateral segment of left lobe of liver there is a 8 mm arterial phase enhancing structure, image 9/16. No washout or pseudo capsule associated with this structure. Also in the lateral segment of left lobe of liver is a 9 mm arterial phase enhancing structure, image 24/16. No pseudo capsule or washout associated with this structure. 7 mm arterial phase enhancing structure is identified within the posterior right lobe without definite pseudo capsule or washout. Gallbladder unremarkable.  No significant biliary ductal dilatation. Pancreas: No mass, inflammatory changes, or other parenchymal abnormality identified. Spleen:  Within normal limits in size and appearance. Adrenals/Urinary Tract:  Normal appearance of the adrenal glands. Bilateral kidney cysts are identified. Most of these appear uniformly T2 hyperintense without contrast enhancement compatible with benign Bosniak category 1 lesions. Arising from the posterior cortex of the left kidney is a 1.9 cm mild Leigh T2 hyperintense cystic lesion, image 10/14. No enhancement identified  within this structure following the IV administration of contrast as seen on the subtraction images. Adjacent T1 hyperintense structure measures 1.7 cm. Arising from the medial aspect of the left lower pole is a 1.6 cm markedly T2 hyperintense structure without enhancement on subtraction images compatible with a hemorrhagic cyst. For several additional milli metric hemorrhagic cyst noted arising from the posterolateral cortex of the left kidney. No solid enhancing kidney mass identified. Stomach/Bowel: Visualized portions within the abdomen are unremarkable. Vascular/Lymphatic: No pathologically enlarged lymph nodes identified. No abdominal aortic aneurysm demonstrated. Other:  No free fluid or fluid collections. Musculoskeletal: No suspicious bone lesions identified. IMPRESSION: 1. Bilateral simple and mildly complex  kidney cysts are noted with signal and enhancement characteristics compatible with benign Bosniak category 1 and 2 kidney lesions. 2. Morphologic features of the liver compatible with cirrhosis. 3. Several arterial phase enhancing milli metric liver lesions are identified. These are compatible with LR-3 (intermediate probability of malignancy). A follow-up liver protocol MRI in 6 months is recommended to assess temporal change in the appearance of these indeterminate lesions. Electronically Signed   By: Kerby Moors M.D.   On: 01/31/2019 14:39     ASSESSMENT/PLAN:  This is a very pleasant 63 year old Caucasian female recently diagnosed with stage IIIA (T2a, N2, M0) non-small cell lung cancer, adenocarcinoma. She presented with a right lower lobe lung mass and mediastinal lymphadenopathy status post right lower lobectomy with lymph node dissection on Jan 07, 2019 under the care of Dr. Roxan Hockey. She has no actionable mutations and her PDL1 expression is negative.  The patient recently had a brain MRI performed to complete the staging workup. Dr. Julien Nordmann personally and independently reviewed the scan and discussed the results with the patient today.  The brain MRI did not show any evidence of metastatic disease to the brain.  Dr. Julien Nordmann had a lengthy discussion with the patient today about her current condition and treatment options. He recommends that the patient receive 4 cycles of adjuvant chemotherapy with cisplatin 75 mg/m and Alimta 500 mg/m2 IV every 3 weeks. This will be followed by adjuvant radiotherapy to the mediastinum.  The patient is interested in proceeding with chemotherapy.  She is expected to start next week on June 30th, 2020.   I discussed the adverse side effects of treatment including but not limited to alopecia, myelosuppression, nausea and vomiting, peripheral neuropathy, liver or renal dysfunction.  The patient will come back for follow-up visit in 2 weeks for a one week  follow visit after receiving her first cycle of chemotherapy.   I will arrange for the patient to attend a chemo education class before staring her first treatment next week.   I sent prescriptions for 10 mg of Compazine p.o. every 6 hours as needed for nausea, 1 mg of folic acid p.o. daily, and a 4 mg tablet of Decadron to be taken BID the day before, the day of, and the day after chemotherapy.   I will arrange for the patient to receive a B12 injection today while in the clinic.  The patient was advised to monitor her blood pressure at home and take her hypertension medications as prescribed.   Labs were reviewed with the patient. The patient has a history of iron deficiency anemia. I advised the patient to continue taking her iron tablets as prescribed.   The patient was advised to call immediately if she has any concerning symptoms in the interval. The patient voices understanding of current disease status and treatment  options and is in agreement with the current care plan. All questions were answered. The patient knows to call the clinic with any problems, questions or concerns. We can certainly see the patient much sooner if necessary   Orders Placed This Encounter  Procedures  . Magnesium    Standing Status:   Standing    Number of Occurrences:   12    Standing Expiration Date:   02/12/2020  . CMP ([facility] only)    Standing Status:   Standing    Number of Occurrences:   12    Standing Expiration Date:   02/12/2020  . CBC with Differential ([facility] Only)    Standing Status:   Standing    Number of Occurrences:   12    Standing Expiration Date:   02/12/2020     Tobe Sos , PA-C 02/12/19  ADDENDUM: Hematology/Oncology Attending: I had a face-to-face encounter with the patient today.  I recommended her care plan.  This is a very pleasant 63 years old white female recently diagnosed with a stage IIIa non-small cell lung cancer, adenocarcinoma with no  actionable mutations and negative PDL 1 expression.  The patient completed the staging work-up by having MRI of the brain that showed no evidence for metastatic disease to the brain.  She is status post right lower lobectomy with lymph node dissection on Jan 07, 2019 under the care of Dr. Roxan Hockey. I had a lengthy discussion with the patient today about her condition and treatment options. I recommended for the patient to consider adjuvant treatment with 4 cycles of platinum based chemotherapy with cisplatin 75 mg/M2 Alimta 500 mg/M2 every 3 weeks.  We discussed with the patient with the survival benefit as well as the adverse effect of this treatment.  After completion of the adjuvant chemotherapy, the patient may be considered for adjuvant radiotherapy to the mediastinum. The patient is interested in proceeding with the treatment and she is expected to start the first cycle of this treatment next week. She will receive vitamin B12 injection today. We will call her pharmacy with prescription for Compazine 10 mg p.o. every 6 hours as needed for nausea, folic acid 1 mg p.o. daily in addition to Decadron 4 mg p.o. twice daily the day before, day of and day after chemotherapy every 3 weeks. The patient will come back for follow-up visit in 2 weeks for evaluation and management of any adverse effect of her treatment. She was advised to call immediately if she has any concerning symptoms in the interval.  Disclaimer: This note was dictated with voice recognition software. Similar sounding words can inadvertently be transcribed and may be missed upon review. Eilleen Kempf, MD 02/12/19

## 2019-02-14 ENCOUNTER — Inpatient Hospital Stay: Payer: Medicare Other

## 2019-02-15 ENCOUNTER — Telehealth: Payer: Self-pay | Admitting: Internal Medicine

## 2019-02-15 NOTE — Telephone Encounter (Signed)
Called and spoke with patient. Confirmed 6/30 appt

## 2019-02-18 ENCOUNTER — Encounter: Payer: Self-pay | Admitting: Internal Medicine

## 2019-02-18 NOTE — Progress Notes (Signed)
Called pt to introduce myself as her Arboriculturist.  Unfortunately there aren't any foundations offering copay assistance for her Dx and the type of ins she has.  I offered the [employer], went over what it covers and gave her an expense sheet.  She would like to apply so she will bring proof of income on 02/19/19.  If approved I will give her an expense sheet and my card for any questions or concerns she may have in the future.

## 2019-02-19 ENCOUNTER — Other Ambulatory Visit: Payer: Self-pay

## 2019-02-19 ENCOUNTER — Encounter: Payer: Self-pay | Admitting: Internal Medicine

## 2019-02-19 ENCOUNTER — Inpatient Hospital Stay: Payer: Medicare Other

## 2019-02-19 ENCOUNTER — Other Ambulatory Visit: Payer: Self-pay | Admitting: Physician Assistant

## 2019-02-19 VITALS — BP 161/84 | HR 81 | Temp 98.9°F | Resp 18

## 2019-02-19 DIAGNOSIS — C3491 Malignant neoplasm of unspecified part of right bronchus or lung: Secondary | ICD-10-CM

## 2019-02-19 DIAGNOSIS — Z5111 Encounter for antineoplastic chemotherapy: Secondary | ICD-10-CM | POA: Diagnosis not present

## 2019-02-19 LAB — CBC WITH DIFFERENTIAL (CANCER CENTER ONLY)
Abs Immature Granulocytes: 0.04 10*3/uL (ref 0.00–0.07)
Basophils Absolute: 0 10*3/uL (ref 0.0–0.1)
Basophils Relative: 0 %
Eosinophils Absolute: 0 10*3/uL (ref 0.0–0.5)
Eosinophils Relative: 0 %
HCT: 39.4 % (ref 36.0–46.0)
Hemoglobin: 12.3 g/dL (ref 12.0–15.0)
Immature Granulocytes: 0 %
Lymphocytes Relative: 12 %
Lymphs Abs: 1.2 10*3/uL (ref 0.7–4.0)
MCH: 24.2 pg — ABNORMAL LOW (ref 26.0–34.0)
MCHC: 31.2 g/dL (ref 30.0–36.0)
MCV: 77.6 fL — ABNORMAL LOW (ref 80.0–100.0)
Monocytes Absolute: 0.3 10*3/uL (ref 0.1–1.0)
Monocytes Relative: 3 %
Neutro Abs: 8.4 10*3/uL — ABNORMAL HIGH (ref 1.7–7.7)
Neutrophils Relative %: 85 %
Platelet Count: 286 10*3/uL (ref 150–400)
RBC: 5.08 MIL/uL (ref 3.87–5.11)
RDW: 16.3 % — ABNORMAL HIGH (ref 11.5–15.5)
WBC Count: 10 10*3/uL (ref 4.0–10.5)
nRBC: 0 % (ref 0.0–0.2)

## 2019-02-19 LAB — CMP (CANCER CENTER ONLY)
ALT: 22 U/L (ref 0–44)
AST: 28 U/L (ref 15–41)
Albumin: 3.8 g/dL (ref 3.5–5.0)
Alkaline Phosphatase: 97 U/L (ref 38–126)
Anion gap: 14 (ref 5–15)
BUN: 10 mg/dL (ref 8–23)
CO2: 18 mmol/L — ABNORMAL LOW (ref 22–32)
Calcium: 9.5 mg/dL (ref 8.9–10.3)
Chloride: 107 mmol/L (ref 98–111)
Creatinine: 0.88 mg/dL (ref 0.44–1.00)
GFR, Est AFR Am: >60 mL/min (ref >60)
GFR, Estimated: >60 mL/min (ref >60)
Glucose, Bld: 159 mg/dL — ABNORMAL HIGH (ref 70–99)
Potassium: 3.8 mmol/L (ref 3.5–5.1)
Sodium: 139 mmol/L (ref 135–145)
Total Bilirubin: 0.9 mg/dL (ref 0.3–1.2)
Total Protein: 8.7 g/dL — ABNORMAL HIGH (ref 6.5–8.1)

## 2019-02-19 LAB — MAGNESIUM: Magnesium: 1.5 mg/dL — ABNORMAL LOW (ref 1.7–2.4)

## 2019-02-19 MED ORDER — MAGNESIUM OXIDE 400 (241.3 MG) MG PO TABS: 400.0000 mg | ORAL_TABLET | Freq: Two times a day (BID) | ORAL | 2 refills | Status: DC

## 2019-02-19 MED ORDER — SODIUM CHLORIDE 0.9 % IV SOLN
Freq: Once | INTRAVENOUS | Status: AC
  Administered 2019-02-19: 12:00:00 via INTRAVENOUS
  Filled 2019-02-19: qty 5

## 2019-02-19 MED ORDER — SODIUM CHLORIDE 0.9 % IV SOLN
Freq: Once | INTRAVENOUS | Status: AC
  Administered 2019-02-19: 08:00:00 via INTRAVENOUS
  Filled 2019-02-19: qty 250

## 2019-02-19 MED ORDER — PALONOSETRON HCL INJECTION 0.25 MG/5ML
INTRAVENOUS | Status: AC
  Filled 2019-02-19: qty 5

## 2019-02-19 MED ORDER — PALONOSETRON HCL INJECTION 0.25 MG/5ML
0.2500 mg | Freq: Once | INTRAVENOUS | Status: AC
  Administered 2019-02-19: 12:00:00 0.25 mg via INTRAVENOUS

## 2019-02-19 MED ORDER — SODIUM CHLORIDE 0.9 % IV SOLN
75.0000 mg/m2 | Freq: Once | INTRAVENOUS | Status: AC
  Administered 2019-02-19: 13:00:00 141 mg via INTRAVENOUS
  Filled 2019-02-19: qty 141

## 2019-02-19 MED ORDER — SODIUM CHLORIDE 0.9 % IV SOLN
1000.0000 mg | Freq: Once | INTRAVENOUS | Status: AC
  Administered 2019-02-19: 13:00:00 1000 mg via INTRAVENOUS
  Filled 2019-02-19: qty 40

## 2019-02-19 MED ORDER — POTASSIUM CHLORIDE 2 MEQ/ML IV SOLN
Freq: Once | INTRAVENOUS | Status: AC
  Administered 2019-02-19: 10:00:00 via INTRAVENOUS
  Filled 2019-02-19: qty 1000

## 2019-02-19 MED ORDER — SODIUM CHLORIDE 0.9 % IV SOLN: 500.0000 mg/m2 | Freq: Once | INTRAVENOUS | Status: DC

## 2019-02-19 MED ORDER — POTASSIUM CHLORIDE 2 MEQ/ML IV SOLN
Freq: Once | INTRAVENOUS | Status: DC
  Filled 2019-02-19: qty 10

## 2019-02-19 NOTE — Progress Notes (Signed)
Pt is approved for the $700 [facility] grant.  °

## 2019-02-19 NOTE — Patient Instructions (Signed)
[name] Discharge Instructions for Patients Receiving Chemotherapy  Today you received the following chemotherapy agents Cisplatin and Alimta  To help prevent nausea and vomiting after your treatment, we encourage you to take your nausea medication as prescribed by MD. **DO NOT TAKE ZOFRAN FOR 3 DAYS AFTER CHEMOTHERAPY**   If you develop nausea and vomiting that is not controlled by your nausea medication, call the clinic.   BELOW ARE SYMPTOMS THAT SHOULD BE REPORTED IMMEDIATELY:  *FEVER GREATER THAN 100.5 F  *CHILLS WITH OR WITHOUT FEVER  NAUSEA AND VOMITING THAT IS NOT CONTROLLED WITH YOUR NAUSEA MEDICATION  *UNUSUAL SHORTNESS OF BREATH  *UNUSUAL BRUISING OR BLEEDING  TENDERNESS IN MOUTH AND THROAT WITH OR WITHOUT PRESENCE OF ULCERS  *URINARY PROBLEMS  *BOWEL PROBLEMS  UNUSUAL RASH Items with * indicate a potential emergency and should be followed up as soon as possible.  Feel free to call the clinic should you have any questions or concerns. The clinic phone number is [phone].  Please show the [facility] at check-in to the Emergency Department and triage nurse.   Cisplatin injection What is this medicine? CISPLATIN (SIS pla tin) is a chemotherapy drug. It targets fast dividing cells, like cancer cells, and causes these cells to die. This medicine is used to treat many types of cancer like bladder, ovarian, and testicular cancers. This medicine may be used for other purposes; ask your health care provider or pharmacist if you have questions. COMMON BRAND NAME(S): Platinol, Platinol -AQ What should I tell my health care provider before I take this medicine? They need to know if you have any of these conditions:  blood disorders  hearing problems  kidney disease  recent or ongoing radiation therapy  an unusual or allergic reaction to cisplatin, carboplatin, other chemotherapy, other medicines, foods, dyes, or preservatives  pregnant  or trying to get pregnant  breast-feeding How should I use this medicine? This drug is given as an infusion into a vein. It is administered in a hospital or clinic by a specially trained health care professional. Talk to your pediatrician regarding the use of this medicine in children. Special care may be needed. Overdosage: If you think you have taken too much of this medicine contact a poison control center or emergency room at once. NOTE: This medicine is only for you. Do not share this medicine with others. What if I miss a dose? It is important not to miss a dose. Call your doctor or health care professional if you are unable to keep an appointment. What may interact with this medicine?  dofetilide  foscarnet  medicines for seizures  medicines to increase blood counts like filgrastim, pegfilgrastim, sargramostim  probenecid  pyridoxine used with altretamine  rituximab  some antibiotics like amikacin, gentamicin, neomycin, polymyxin B, streptomycin, tobramycin  sulfinpyrazone  vaccines  zalcitabine Talk to your doctor or health care professional before taking any of these medicines:  acetaminophen  aspirin  ibuprofen  ketoprofen  naproxen This list may not describe all possible interactions. Give your health care provider a list of all the medicines, herbs, non-prescription drugs, or dietary supplements you use. Also tell them if you smoke, drink alcohol, or use illegal drugs. Some items may interact with your medicine. What should I watch for while using this medicine? Your condition will be monitored carefully while you are receiving this medicine. You will need important blood work done while you are taking this medicine. This drug may make you feel generally  unwell. This is not uncommon, as chemotherapy can affect healthy cells as well as cancer cells. Report any side effects. Continue your course of treatment even though you feel ill unless your doctor tells  you to stop. In some cases, you may be given additional medicines to help with side effects. Follow all directions for their use. Call your doctor or health care professional for advice if you get a fever, chills or sore throat, or other symptoms of a cold or flu. Do not treat yourself. This drug decreases your body's ability to fight infections. Try to avoid being around people who are sick. This medicine may increase your risk to bruise or bleed. Call your doctor or health care professional if you notice any unusual bleeding. Be careful brushing and flossing your teeth or using a toothpick because you may get an infection or bleed more easily. If you have any dental work done, tell your dentist you are receiving this medicine. Avoid taking products that contain aspirin, acetaminophen, ibuprofen, naproxen, or ketoprofen unless instructed by your doctor. These medicines may hide a fever. Do not become pregnant while taking this medicine. Women should inform their doctor if they wish to become pregnant or think they might be pregnant. There is a potential for serious side effects to an unborn child. Talk to your health care professional or pharmacist for more information. Do not breast-feed an infant while taking this medicine. Drink fluids as directed while you are taking this medicine. This will help protect your kidneys. Call your doctor or health care professional if you get diarrhea. Do not treat yourself. What side effects may I notice from receiving this medicine? Side effects that you should report to your doctor or health care professional as soon as possible:  allergic reactions like skin rash, itching or hives, swelling of the face, lips, or tongue  signs of infection - fever or chills, cough, sore throat, pain or difficulty passing urine  signs of decreased platelets or bleeding - bruising, pinpoint red spots on the skin, black, tarry stools, nosebleeds  signs of decreased red blood  cells - unusually weak or tired, fainting spells, lightheadedness  breathing problems  changes in hearing  gout pain  low blood counts - This drug may decrease the number of white blood cells, red blood cells and platelets. You may be at increased risk for infections and bleeding.  nausea and vomiting  pain, swelling, redness or irritation at the injection site  pain, tingling, numbness in the hands or feet  problems with balance, movement  trouble passing urine or change in the amount of urine Side effects that usually do not require medical attention (report to your doctor or health care professional if they continue or are bothersome):  changes in vision  loss of appetite  metallic taste in the mouth or changes in taste This list may not describe all possible side effects. Call your doctor for medical advice about side effects. You may report side effects to FDA at 1-800-FDA-1088. Where should I keep my medicine? This drug is given in a hospital or clinic and will not be stored at home. NOTE: This sheet is a summary. It may not cover all possible information. If you have questions about this medicine, talk to your doctor, pharmacist, or health care provider.  2020 Elsevier/Gold Standard (2007-11-13 14:40:54)    Pemetrexed injection What is this medicine? PEMETREXED (PEM e TREX ed) is a chemotherapy drug used to treat lung cancers like non-small cell lung  cancer and mesothelioma. It may also be used to treat other cancers. This medicine may be used for other purposes; ask your health care provider or pharmacist if you have questions. COMMON BRAND NAME(S): Alimta What should I tell my health care provider before I take this medicine? They need to know if you have any of these conditions:  infection (especially a virus infection such as chickenpox, cold sores, or herpes)  kidney disease  low blood counts, like low white cell, platelet, or red cell counts  lung or  breathing disease, like asthma  radiation therapy  an unusual or allergic reaction to pemetrexed, other medicines, foods, dyes, or preservative  pregnant or trying to get pregnant  breast-feeding How should I use this medicine? This drug is given as an infusion into a vein. It is administered in a hospital or clinic by a specially trained health care professional. Talk to your pediatrician regarding the use of this medicine in children. Special care may be needed. Overdosage: If you think you have taken too much of this medicine contact a poison control center or emergency room at once. NOTE: This medicine is only for you. Do not share this medicine with others. What if I miss a dose? It is important not to miss your dose. Call your doctor or health care professional if you are unable to keep an appointment. What may interact with this medicine? This medicine may interact with the following medications:  Ibuprofen This list may not describe all possible interactions. Give your health care provider a list of all the medicines, herbs, non-prescription drugs, or dietary supplements you use. Also tell them if you smoke, drink alcohol, or use illegal drugs. Some items may interact with your medicine. What should I watch for while using this medicine? Visit your doctor for checks on your progress. This drug may make you feel generally unwell. This is not uncommon, as chemotherapy can affect healthy cells as well as cancer cells. Report any side effects. Continue your course of treatment even though you feel ill unless your doctor tells you to stop. In some cases, you may be given additional medicines to help with side effects. Follow all directions for their use. Call your doctor or health care professional for advice if you get a fever, chills or sore throat, or other symptoms of a cold or flu. Do not treat yourself. This drug decreases your body's ability to fight infections. Try to avoid being  around people who are sick. This medicine may increase your risk to bruise or bleed. Call your doctor or health care professional if you notice any unusual bleeding. Be careful brushing and flossing your teeth or using a toothpick because you may get an infection or bleed more easily. If you have any dental work done, tell your dentist you are receiving this medicine. Avoid taking products that contain aspirin, acetaminophen, ibuprofen, naproxen, or ketoprofen unless instructed by your doctor. These medicines may hide a fever. Call your doctor or health care professional if you get diarrhea or mouth sores. Do not treat yourself. To protect your kidneys, drink water or other fluids as directed while you are taking this medicine. Do not become pregnant while taking this medicine or for 6 months after stopping it. Women should inform their doctor if they wish to become pregnant or think they might be pregnant. Men should not father a child while taking this medicine and for 3 months after stopping it. This may interfere with the ability to father  a child. You should talk to your doctor or health care professional if you are concerned about your fertility. There is a potential for serious side effects to an unborn child. Talk to your health care professional or pharmacist for more information. Do not breast-feed an infant while taking this medicine or for 1 week after stopping it. What side effects may I notice from receiving this medicine? Side effects that you should report to your doctor or health care professional as soon as possible:  allergic reactions like skin rash, itching or hives, swelling of the face, lips, or tongue  breathing problems  redness, blistering, peeling or loosening of the skin, including inside the mouth  signs and symptoms of bleeding such as bloody or black, tarry stools; red or dark-brown urine; spitting up blood or brown material that looks like coffee grounds; red spots on  the skin; unusual bruising or bleeding from the eye, gums, or nose  signs and symptoms of infection like fever or chills; cough; sore throat; pain or trouble passing urine  signs and symptoms of kidney injury like trouble passing urine or change in the amount of urine  signs and symptoms of liver injury like dark yellow or brown urine; general ill feeling or flu-like symptoms; light-colored stools; loss of appetite; nausea; right upper belly pain; unusually weak or tired; yellowing of the eyes or skin Side effects that usually do not require medical attention (report to your doctor or health care professional if they continue or are bothersome):  constipation  mouth sores  nausea, vomiting  unusually weak or tired This list may not describe all possible side effects. Call your doctor for medical advice about side effects. You may report side effects to FDA at 1-800-FDA-1088. Where should I keep my medicine? This drug is given in a hospital or clinic and will not be stored at home. NOTE: This sheet is a summary. It may not cover all possible information. If you have questions about this medicine, talk to your doctor, pharmacist, or health care provider.  2020 Elsevier/Gold Standard (2017-09-27 16:11:33)

## 2019-02-19 NOTE — Progress Notes (Signed)
Mg = 1.5 mg/dL today. Received call from [place] - add 2 g Mg to IVF today. Total Mg today in IVF = 3.5 g. Kennith Center, Pharm.D., CPP 02/19/2019@9 :15 AM

## 2019-02-20 ENCOUNTER — Telehealth: Payer: Self-pay | Admitting: *Deleted

## 2019-02-20 NOTE — Telephone Encounter (Signed)
Called [name] for chemotherapy F/U.  Patient is doing well.  "I think I'm jacked up from the steroids cleaning doing all kinds of stuff.  Otherwise I am fine."  Denies n/v.  Denies any new side effects or symptoms.  Bowel and bladder functioning well.  Eating and drinking well.  Instructed to drink 64 oz minimum daily or at least the day before, of and after treatment.   Denies questions or needs at this time.  Encouraged to call [phone] Mon -Fri 8:00 am - 4:30 pm or anytime as needed for symptoms, changes or event outside office hours.

## 2019-02-20 NOTE — Telephone Encounter (Signed)
-----   Message from Shelda Altes, RN sent at 02/19/2019  4:15 PM EDT ----- Regarding: 1st time F/U phone call Dr. Worthy Flank patient who received alimta and cisplatin for the first time. Tolerated well.  Thanks, Jarrett Soho

## 2019-02-26 ENCOUNTER — Encounter: Payer: Self-pay | Admitting: Internal Medicine

## 2019-02-26 ENCOUNTER — Inpatient Hospital Stay: Payer: Medicare Other

## 2019-02-26 ENCOUNTER — Inpatient Hospital Stay: Payer: Medicare Other | Attending: Internal Medicine | Admitting: Internal Medicine

## 2019-02-26 ENCOUNTER — Other Ambulatory Visit: Payer: Self-pay

## 2019-02-26 VITALS — BP 140/72 | HR 66 | Temp 98.7°F | Resp 20 | Ht 64.0 in | Wt 168.3 lb

## 2019-02-26 DIAGNOSIS — Z79899 Other long term (current) drug therapy: Secondary | ICD-10-CM | POA: Diagnosis not present

## 2019-02-26 DIAGNOSIS — R5383 Other fatigue: Secondary | ICD-10-CM | POA: Insufficient documentation

## 2019-02-26 DIAGNOSIS — Z7984 Long term (current) use of oral hypoglycemic drugs: Secondary | ICD-10-CM | POA: Insufficient documentation

## 2019-02-26 DIAGNOSIS — E785 Hyperlipidemia, unspecified: Secondary | ICD-10-CM

## 2019-02-26 DIAGNOSIS — C3491 Malignant neoplasm of unspecified part of right bronchus or lung: Secondary | ICD-10-CM

## 2019-02-26 DIAGNOSIS — I10 Essential (primary) hypertension: Secondary | ICD-10-CM | POA: Insufficient documentation

## 2019-02-26 DIAGNOSIS — Z5111 Encounter for antineoplastic chemotherapy: Secondary | ICD-10-CM | POA: Diagnosis present

## 2019-02-26 DIAGNOSIS — R05 Cough: Secondary | ICD-10-CM | POA: Insufficient documentation

## 2019-02-26 DIAGNOSIS — E119 Type 2 diabetes mellitus without complications: Secondary | ICD-10-CM

## 2019-02-26 DIAGNOSIS — C3431 Malignant neoplasm of lower lobe, right bronchus or lung: Secondary | ICD-10-CM | POA: Diagnosis present

## 2019-02-26 LAB — CBC WITH DIFFERENTIAL (CANCER CENTER ONLY)
Abs Immature Granulocytes: 0.01 10*3/uL (ref 0.00–0.07)
Basophils Absolute: 0 10*3/uL (ref 0.0–0.1)
Basophils Relative: 0 %
Eosinophils Absolute: 0.2 10*3/uL (ref 0.0–0.5)
Eosinophils Relative: 5 %
HCT: 39 % (ref 36.0–46.0)
Hemoglobin: 11.9 g/dL — ABNORMAL LOW (ref 12.0–15.0)
Immature Granulocytes: 0 %
Lymphocytes Relative: 50 %
Lymphs Abs: 1.8 10*3/uL (ref 0.7–4.0)
MCH: 24.2 pg — ABNORMAL LOW (ref 26.0–34.0)
MCHC: 30.5 g/dL (ref 30.0–36.0)
MCV: 79.3 fL — ABNORMAL LOW (ref 80.0–100.0)
Monocytes Absolute: 0.3 10*3/uL (ref 0.1–1.0)
Monocytes Relative: 7 %
Neutro Abs: 1.4 10*3/uL — ABNORMAL LOW (ref 1.7–7.7)
Neutrophils Relative %: 38 %
Platelet Count: 208 10*3/uL (ref 150–400)
RBC: 4.92 MIL/uL (ref 3.87–5.11)
RDW: 17 % — ABNORMAL HIGH (ref 11.5–15.5)
WBC Count: 3.6 10*3/uL — ABNORMAL LOW (ref 4.0–10.5)
nRBC: 0 % (ref 0.0–0.2)

## 2019-02-26 LAB — MAGNESIUM: Magnesium: 1.3 mg/dL — CL (ref 1.7–2.4)

## 2019-02-26 LAB — CMP (CANCER CENTER ONLY)
ALT: 63 U/L — ABNORMAL HIGH (ref 0–44)
AST: 42 U/L — ABNORMAL HIGH (ref 15–41)
Albumin: 3.1 g/dL — ABNORMAL LOW (ref 3.5–5.0)
Alkaline Phosphatase: 78 U/L (ref 38–126)
Anion gap: 9 (ref 5–15)
BUN: 11 mg/dL (ref 8–23)
CO2: 26 mmol/L (ref 22–32)
Calcium: 9 mg/dL (ref 8.9–10.3)
Chloride: 104 mmol/L (ref 98–111)
Creatinine: 0.78 mg/dL (ref 0.44–1.00)
GFR, Est AFR Am: >60 mL/min (ref >60)
GFR, Estimated: >60 mL/min (ref >60)
Glucose, Bld: 137 mg/dL — ABNORMAL HIGH (ref 70–99)
Potassium: 3.5 mmol/L (ref 3.5–5.1)
Sodium: 139 mmol/L (ref 135–145)
Total Bilirubin: 0.5 mg/dL (ref 0.3–1.2)
Total Protein: 6.7 g/dL (ref 6.5–8.1)

## 2019-02-26 MED ORDER — MAGNESIUM OXIDE 400 (241.3 MG) MG PO TABS: 800.0000 mg | ORAL_TABLET | Freq: Two times a day (BID) | ORAL | 2 refills | Status: DC

## 2019-02-26 NOTE — Progress Notes (Signed)
°     [facility] °[phone]   [phone] ° °OFFICE PROGRESS NOTE ° °Elkins, Sandy Oliver, MD °[address] °[place] [address] ° °DIAGNOSIS: stage IIIA (T2a, N2, M0) non-small cell lung cancer, adenocarcinoma presented with right lower lobe lung mass and mediastinal lymphadenopathy. Diagnosed in May 2020.  °  °Biomarker Findings °Microsatellite status - MS-Stable °Tumor Mutational Burden - 9 Muts/Mb °Genomic Findings °For a complete list of the genes assayed, please refer to the Appendix. °NF1 K33* °TP53 R158P °8 Disease relevant genes with no reportable alterations: ALK, BRAF, °EGFR, ERBB2, KRAS, MET, RET, ROS1 °  °PDL 1 expression negative °  °PRIOR THERAPY:  Status post right lower lobectomy with lymph node dissection on Jan 07, 2019 under the care of Dr. Hendrickson. °  °CURRENT THERAPY: Adjuvant platinum based regimen with cisplatin 75 mg/M2 and Alimta 500 mg/M2 every 3 weeks. First dose on June 30th, 2020.  Status post 1 cycle °  ° °INTERVAL HISTORY: °Sandy [name] 63 y.o. female returns to the clinic today for follow-up visit.  The patient is feeling fine today with no concerning complaints except for fatigue.  She tolerated the first week of her treatment well.  She denied having any chest pain, shortness of breath, cough or hemoptysis.  She denied having any fever or chills.  She has no nausea, vomiting, diarrhea or constipation.  She has no headache or visual changes.  She is here today for evaluation and repeat blood work. ° °MEDICAL HISTORY: °Past Medical History:  °Diagnosis Date  °• Arthritis   °• Cirrhosis (HCC)   °• COPD (chronic obstructive pulmonary disease) (HCC)   °• Dyslipidemia   °• Dyspnea   °• Esophageal varices (HCC)   °• History of blood transfusion   °• History of hiatal hernia   °• Hypertension   °• Pneumonia   °• Type 2 diabetes mellitus (HCC)   ° Type II  ° ° °ALLERGIES:  is allergic to codeine and nsaids. ° °MEDICATIONS:  °Current  Outpatient Medications  °Medication Sig Dispense Refill  °• atorvastatin (LIPITOR) 10 MG tablet Take 10 mg by mouth at bedtime.     °• dexamethasone (DECADRON) 4 MG tablet Take 1 (4 mg) tablet twice a day the day before, the day of, and the day after chemotherapy 24 tablet 0  °• diphenhydrAMINE (BENADRYL) 25 MG tablet Take 50 mg by mouth at bedtime as needed.    °• folic acid (FOLVITE) 1 MG tablet Take 1 tablet (1 mg total) by mouth daily. 30 tablet 2  °• losartan (COZAAR) 50 MG tablet Take 1 tablet (50 mg total) by mouth daily. 30 tablet 3  °• magnesium oxide (MAG-OX) 400 (241.3 Mg) MG tablet Take 1 tablet (400 mg total) by mouth 2 (two) times daily. 60 tablet 2  °• metFORMIN (GLUCOPHAGE-XR) 500 MG 24 hr tablet Take 500 mg by mouth at bedtime.    °• nadolol (CORGARD) 40 MG tablet Take 40 mg by mouth at bedtime.    °• omeprazole (PRILOSEC) 20 MG capsule Take 20 mg by mouth daily.     °• Tetrahydrozoline HCl (VISINE OP) Place 1 drop into both eyes daily as needed (irritation).    °• acetaminophen (TYLENOL) 500 MG tablet Take 1,000 mg by mouth every 6 (six) hours as needed for moderate pain or headache.    °• ondansetron (ZOFRAN) 4 MG tablet Take 1 tablet (4 mg total) by mouth every 6 (six) hours as needed for nausea or vomiting. (Patient not taking: Reported   Reported on 02/12/2019) 20 tablet 0   prochlorperazine (COMPAZINE) 10 MG tablet Take 1 tablet (10 mg total) by mouth every 6 (six) hours as needed for nausea or vomiting. (Patient not taking: Reported on 02/26/2019) 30 tablet 0   No current facility-administered medications for this visit.     SURGICAL HISTORY:  Past Surgical History:  Procedure Laterality Date   ABDOMINAL HYSTERECTOMY     ANTERIOR CRUCIATE LIGAMENT REPAIR Left    x 2   ESOPHAGOGASTRODUODENOSCOPY     LOBECTOMY Right 01/07/2019   Procedure: RIGHT LOWER LOBE LOBECTOMY AND INTERCOSTAL NERVE BLOCK;  Surgeon: Melrose Nakayama, MD;  Location: [facility];  Service: Thoracic;  Laterality: Right;     VIDEO ASSISTED THORACOSCOPY (VATS)/WEDGE RESECTION Right 01/07/2019   Procedure: VIDEO ASSISTED THORACOSCOPY (VATS) RIGHT LOWER LOBE WEDGE RESECTION;  Surgeon: Melrose Nakayama, MD;  Location: [facility];  Service: Thoracic;  Laterality: Right;    REVIEW OF SYSTEMS:  A comprehensive review of systems was negative except for: Constitutional: positive for fatigue   PHYSICAL EXAMINATION: General appearance: alert, cooperative, fatigued and no distress Head: Normocephalic, without obvious abnormality, atraumatic Neck: no adenopathy, no JVD, supple, symmetrical, trachea midline and thyroid not enlarged, symmetric, no tenderness/mass/nodules Lymph nodes: Cervical, supraclavicular, and axillary nodes normal. Resp: clear to auscultation bilaterally Back: symmetric, no curvature. ROM normal. No CVA tenderness. Cardio: regular rate and rhythm, S1, S2 normal, no murmur, click, rub or gallop GI: soft, non-tender; bowel sounds normal; no masses,  no organomegaly Extremities: extremities normal, atraumatic, no cyanosis or edema  ECOG PERFORMANCE STATUS: 1 - Symptomatic but completely ambulatory  Blood pressure 140/72, pulse 66, temperature 98.7 F (37.1 C), temperature source Oral, resp. rate 20, height 5' 4" (1.626 m), weight 168 lb 4.8 oz (76.3 kg), SpO2 100 %.  LABORATORY DATA: Lab Results  Component Value Date   WBC 3.6 (L) 02/26/2019   HGB 11.9 (L) 02/26/2019   HCT 39.0 02/26/2019   MCV 79.3 (L) 02/26/2019   PLT 208 02/26/2019      Chemistry      Component Value Date/Time   NA 139 02/26/2019 0923   K 3.5 02/26/2019 0923   CL 104 02/26/2019 0923   CO2 26 02/26/2019 0923   BUN 11 02/26/2019 0923   CREATININE 0.78 02/26/2019 0923      Component Value Date/Time   CALCIUM 9.0 02/26/2019 0923   ALKPHOS 78 02/26/2019 0923   AST 42 (H) 02/26/2019 0923   ALT 63 (H) 02/26/2019 0923   BILITOT 0.5 02/26/2019 7893       RADIOGRAPHIC STUDIES: Mr Jeri Cos YB Contrast  Result Date:  01/29/2019 CLINICAL DATA:  Non-small cell lung cancer.  Staging. EXAM: MRI HEAD WITHOUT AND WITH CONTRAST TECHNIQUE: Multiplanar, multiecho pulse sequences of the brain and surrounding structures were obtained without and with intravenous contrast. CONTRAST:  Gadavist 7 mL. COMPARISON:  None. FINDINGS: Brain: No acute infarction, hemorrhage, hydrocephalus, extra-axial collection or mass lesion. Slight premature cerebral and cerebellar atrophy. Mild subcortical and periventricular T2 and FLAIR hyperintensities, likely chronic microvascular ischemic change. Post infusion, no abnormal enhancement of the brain or meninges. Vascular: Normal flow voids. Skull and upper cervical spine: Normal marrow signal. Sinuses/Orbits: Negative. Other: None. IMPRESSION: Mild atrophy. Mild small vessel disease. No acute intracranial findings. No intracranial metastatic disease is observed. Electronically Signed   By: Staci Righter M.D.   On: 01/29/2019 09:16   Mr Abdomen Wwo Contrast  Result Date: 01/31/2019 CLINICAL DATA:  Follow-up left kidney lesion.  MRI ABDOMEN WITHOUT AND WITH CONTRAST TECHNIQUE: Multiplanar multisequence MR imaging of the abdomen was performed both before and after the administration of intravenous contrast. CONTRAST:  16mL MULTIHANCE GADOBENATE DIMEGLUMINE 529 MG/ML IV SOLN COMPARISON:  12/07/2018 FINDINGS: Lower chest: Right pleural effusion identified. Hepatobiliary: Liver has a macronodular contour with hypertrophy of the lateral segment of left lobe of liver compatible with cirrhosis. Hepatic steatosis noted. In the lateral segment of left lobe of liver there is a 8 mm arterial phase enhancing structure, image 9/16. No washout or pseudo capsule associated with this structure. Also in the lateral segment of left lobe of liver is a 9 mm arterial phase enhancing structure, image 24/16. No pseudo capsule or washout associated with this structure. 7 mm arterial phase enhancing structure is identified  within the posterior right lobe without definite pseudo capsule or washout. Gallbladder unremarkable.  No significant biliary ductal dilatation. Pancreas: No mass, inflammatory changes, or other parenchymal abnormality identified. Spleen:  Within normal limits in size and appearance. Adrenals/Urinary Tract:  Normal appearance of the adrenal glands. Bilateral kidney cysts are identified. Most of these appear uniformly T2 hyperintense without contrast enhancement compatible with benign Bosniak category 1 lesions. Arising from the posterior cortex of the left kidney is a 1.9 cm mild Leigh T2 hyperintense cystic lesion, image 10/14. No enhancement identified within this structure following the IV administration of contrast as seen on the subtraction images. Adjacent T1 hyperintense structure measures 1.7 cm. Arising from the medial aspect of the left lower pole is a 1.6 cm markedly T2 hyperintense structure without enhancement on subtraction images compatible with a hemorrhagic cyst. For several additional milli metric hemorrhagic cyst noted arising from the posterolateral cortex of the left kidney. No solid enhancing kidney mass identified. Stomach/Bowel: Visualized portions within the abdomen are unremarkable. Vascular/Lymphatic: No pathologically enlarged lymph nodes identified. No abdominal aortic aneurysm demonstrated. Other:  No free fluid or fluid collections. Musculoskeletal: No suspicious bone lesions identified. IMPRESSION: 1. Bilateral simple and mildly complex kidney cysts are noted with signal and enhancement characteristics compatible with benign Bosniak category 1 and 2 kidney lesions. 2. Morphologic features of the liver compatible with cirrhosis. 3. Several arterial phase enhancing milli metric liver lesions are identified. These are compatible with LR-3 (intermediate probability of malignancy). A follow-up liver protocol MRI in 6 months is recommended to assess temporal change in the appearance of  these indeterminate lesions. Electronically Signed   By: Taylor  Stroud M.D.   On: 01/31/2019 14:39  ° ° °ASSESSMENT AND PLAN: This is a very pleasant 62 years old white female with a stage IIIa non-small cell lung cancer, adenocarcinoma with no actionable mutations status post right lower lobectomy with lymph node dissection under the care of Dr. Hendrickson on Jan 07, 2019. °The patient is currently undergoing adjuvant systemic chemotherapy with cisplatin and Alimta status post 1 cycle. °She tolerated the first week of her treatment fairly well with no concerning adverse effect except for fatigue. °I recommended for the patient to continue her current treatment and he will come back for follow-up visit in 2 weeks for evaluation before starting cycle #2. °For the hypomagnesemia I recommended for her to increase magnesium oxide to 400 mg p.o. 4 times daily. °She was advised to call immediately if she has any other concerning symptoms in the interval. °The patient voices understanding of current disease status and treatment options and is in agreement with the current care plan. ° °All questions were answered. The patient knows to call the   clinic with any problems, questions or concerns. We can certainly see the patient much sooner if necessary. ° °I spent 10 minutes counseling the patient face to face. The total time spent in the appointment was 15 minutes. ° °Disclaimer: This note was dictated with voice recognition software. Similar sounding words can inadvertently be transcribed and may not be corrected upon review. ° ° °  °  °

## 2019-02-28 ENCOUNTER — Ambulatory Visit (INDEPENDENT_AMBULATORY_CARE_PROVIDER_SITE_OTHER): Payer: Self-pay | Admitting: Physician Assistant

## 2019-02-28 ENCOUNTER — Other Ambulatory Visit: Payer: Self-pay

## 2019-02-28 VITALS — BP 170/88 | HR 78 | Temp 97.7°F | Resp 20

## 2019-02-28 DIAGNOSIS — T8149XA Infection following a procedure, other surgical site, initial encounter: Secondary | ICD-10-CM

## 2019-02-28 DIAGNOSIS — Z4889 Encounter for other specified surgical aftercare: Secondary | ICD-10-CM

## 2019-02-28 DIAGNOSIS — Z902 Acquired absence of lung [part of]: Secondary | ICD-10-CM

## 2019-02-28 MED ORDER — CEPHALEXIN 500 MG PO CAPS: 500.0000 mg | ORAL_CAPSULE | Freq: Two times a day (BID) | ORAL | 0 refills | Status: AC

## 2019-02-28 NOTE — Patient Instructions (Signed)
Follow-up with Dr. Roxan Hockey on 8/4

## 2019-02-28 NOTE — Progress Notes (Addendum)
    [address]       [place] [address]             [phone]       Sandy [name] is a 63 y.o. female patient who presented today for a wound check. She is s/p right lower  lobectomy on May 18th 2020.    1. Incisional abscess   2. S/P lobectomy of lung    Past Medical History:  Diagnosis Date  . Arthritis   . Cirrhosis (HCC)   . COPD (chronic obstructive pulmonary disease) (HCC)   . Dyslipidemia   . Dyspnea   . Esophageal varices (HCC)   . History of blood transfusion   . History of hiatal hernia   . Hypertension   . Pneumonia   . Type 2 diabetes mellitus (HCC)    Type II   No past surgical history pertinent negatives on file. Scheduled Meds: Current Outpatient Medications on File Prior to Visit  Medication Sig Dispense Refill  . acetaminophen (TYLENOL) 500 MG tablet Take 1,000 mg by mouth every 6 (six) hours as needed for moderate pain or headache.    . atorvastatin (LIPITOR) 10 MG tablet Take 10 mg by mouth at bedtime.     . dexamethasone (DECADRON) 4 MG tablet Take 1 (4 mg) tablet twice a day the day before, the day of, and the day after chemotherapy 24 tablet 0  . diphenhydrAMINE (BENADRYL) 25 MG tablet Take 50 mg by mouth at bedtime as needed.    . folic acid (FOLVITE) 1 MG tablet Take 1 tablet (1 mg total) by mouth daily. 30 tablet 2  . losartan (COZAAR) 50 MG tablet Take 1 tablet (50 mg total) by mouth daily. 30 tablet 3  . magnesium oxide (MAG-OX) 400 (241.3 Mg) MG tablet Take 2 tablets (800 mg total) by mouth 2 (two) times daily. 60 tablet 2  . metFORMIN (GLUCOPHAGE-XR) 500 MG 24 hr tablet Take 500 mg by mouth at bedtime.    . nadolol (CORGARD) 40 MG tablet Take 40 mg by mouth at bedtime.    . omeprazole (PRILOSEC) 20 MG capsule Take 20 mg by mouth daily.     . prochlorperazine (COMPAZINE) 10 MG tablet Take 1 tablet (10 mg total) by mouth every 6 (six) hours as needed for nausea or vomiting. 30 tablet 0  . Tetrahydrozoline HCl (VISINE OP)  Place 1 drop into both eyes daily as needed (irritation).    . ondansetron (ZOFRAN) 4 MG tablet Take 1 tablet (4 mg total) by mouth every 6 (six) hours as needed for nausea or vomiting. (Patient not taking: Reported on 02/12/2019) 20 tablet 0   No current facility-administered medications on file prior to visit.     Allergies  Allergen Reactions  . Codeine Nausea And Vomiting  . Nsaids     Avoid due to esophageal varices   Active Problems:   * No active hospital problems. *  Blood pressure (!) 170/88, pulse 78, temperature 97.7 F (36.5 C), temperature source Skin, resp. rate 20, SpO2 98 %.  Subjective She presents today for a wound check.   Objective   On the medial aspect of her thoracotomy incision she has a pin-sized pus filled abscess. It is due to a stitch knot that was not dissolved. The area is red. The stitch was removed using a suture removal kit and a small amount of drainage was released. It was purulent then clear.  .Assessment & Plan     The patient was worried about the wound since she just started chemotherapy. The patient is also a diabetic and I explained that this can cause her to heal more slowly. Once the stitch was removed and cleaned with an alcohol pad I gave her some 2 x 2 gauze pads to put over the incision if there is more drainage. I also started her on 5 days of Keflex 500mg BID for a precautionary measure. She doesn't have any other signs or symptoms of infection.  She is to call our office if the area becomes more red and if there is more drainage looking like pus. She has an appointment on 8/4 with Dr. Hendrickson and will get a chest xray at that time. If she needs to be seen beforehand due to her wound we can always work her into the schedule.  New prescription: Keflex 500mg BID x 5 days.  Discussed holding the metformin for a few days while taking the Keflex. Patient agreed however she states that her blood glucose is usually so high that she doubts it  will be a problem. She was instructed to keep our office informed and to take her blood glucose level several times a day.      N  02/28/2019 

## 2019-03-05 ENCOUNTER — Other Ambulatory Visit: Payer: Self-pay

## 2019-03-05 ENCOUNTER — Inpatient Hospital Stay: Payer: Medicare Other

## 2019-03-05 DIAGNOSIS — Z5111 Encounter for antineoplastic chemotherapy: Secondary | ICD-10-CM | POA: Diagnosis not present

## 2019-03-05 DIAGNOSIS — C3491 Malignant neoplasm of unspecified part of right bronchus or lung: Secondary | ICD-10-CM

## 2019-03-05 LAB — CBC WITH DIFFERENTIAL (CANCER CENTER ONLY)
Abs Immature Granulocytes: 0.01 10*3/uL (ref 0.00–0.07)
Basophils Absolute: 0 10*3/uL (ref 0.0–0.1)
Basophils Relative: 1 %
Eosinophils Absolute: 0.2 10*3/uL (ref 0.0–0.5)
Eosinophils Relative: 6 %
HCT: 35 % — ABNORMAL LOW (ref 36.0–46.0)
Hemoglobin: 10.7 g/dL — ABNORMAL LOW (ref 12.0–15.0)
Immature Granulocytes: 0 %
Lymphocytes Relative: 44 %
Lymphs Abs: 1.4 10*3/uL (ref 0.7–4.0)
MCH: 24.5 pg — ABNORMAL LOW (ref 26.0–34.0)
MCHC: 30.6 g/dL (ref 30.0–36.0)
MCV: 80.1 fL (ref 80.0–100.0)
Monocytes Absolute: 0.4 10*3/uL (ref 0.1–1.0)
Monocytes Relative: 14 %
Neutro Abs: 1.1 10*3/uL — ABNORMAL LOW (ref 1.7–7.7)
Neutrophils Relative %: 35 %
Platelet Count: 137 10*3/uL — ABNORMAL LOW (ref 150–400)
RBC: 4.37 MIL/uL (ref 3.87–5.11)
RDW: 17.4 % — ABNORMAL HIGH (ref 11.5–15.5)
WBC Count: 3.2 10*3/uL — ABNORMAL LOW (ref 4.0–10.5)
nRBC: 0 % (ref 0.0–0.2)

## 2019-03-05 LAB — CMP (CANCER CENTER ONLY)
ALT: 22 U/L (ref 0–44)
AST: 23 U/L (ref 15–41)
Albumin: 3.2 g/dL — ABNORMAL LOW (ref 3.5–5.0)
Alkaline Phosphatase: 96 U/L (ref 38–126)
Anion gap: 8 (ref 5–15)
BUN: 8 mg/dL (ref 8–23)
CO2: 24 mmol/L (ref 22–32)
Calcium: 8.9 mg/dL (ref 8.9–10.3)
Chloride: 107 mmol/L (ref 98–111)
Creatinine: 0.74 mg/dL (ref 0.44–1.00)
GFR, Est AFR Am: >60 mL/min (ref >60)
GFR, Estimated: >60 mL/min (ref >60)
Glucose, Bld: 81 mg/dL (ref 70–99)
Potassium: 4.4 mmol/L (ref 3.5–5.1)
Sodium: 139 mmol/L (ref 135–145)
Total Bilirubin: 0.4 mg/dL (ref 0.3–1.2)
Total Protein: 6.8 g/dL (ref 6.5–8.1)

## 2019-03-05 LAB — MAGNESIUM: Magnesium: 1.5 mg/dL — ABNORMAL LOW (ref 1.7–2.4)

## 2019-03-12 ENCOUNTER — Inpatient Hospital Stay (HOSPITAL_BASED_OUTPATIENT_CLINIC_OR_DEPARTMENT_OTHER): Payer: Medicare Other | Admitting: Internal Medicine

## 2019-03-12 ENCOUNTER — Encounter: Payer: Self-pay | Admitting: Internal Medicine

## 2019-03-12 ENCOUNTER — Inpatient Hospital Stay: Payer: Medicare Other

## 2019-03-12 ENCOUNTER — Other Ambulatory Visit: Payer: Self-pay

## 2019-03-12 VITALS — BP 167/79 | HR 80 | Temp 99.1°F | Resp 20 | Ht 64.0 in | Wt 175.7 lb

## 2019-03-12 DIAGNOSIS — Z5111 Encounter for antineoplastic chemotherapy: Secondary | ICD-10-CM

## 2019-03-12 DIAGNOSIS — C3491 Malignant neoplasm of unspecified part of right bronchus or lung: Secondary | ICD-10-CM

## 2019-03-12 DIAGNOSIS — C3431 Malignant neoplasm of lower lobe, right bronchus or lung: Secondary | ICD-10-CM

## 2019-03-12 DIAGNOSIS — Z7984 Long term (current) use of oral hypoglycemic drugs: Secondary | ICD-10-CM

## 2019-03-12 DIAGNOSIS — R05 Cough: Secondary | ICD-10-CM | POA: Diagnosis not present

## 2019-03-12 DIAGNOSIS — Z79899 Other long term (current) drug therapy: Secondary | ICD-10-CM

## 2019-03-12 DIAGNOSIS — R5383 Other fatigue: Secondary | ICD-10-CM

## 2019-03-12 DIAGNOSIS — E785 Hyperlipidemia, unspecified: Secondary | ICD-10-CM

## 2019-03-12 DIAGNOSIS — I10 Essential (primary) hypertension: Secondary | ICD-10-CM

## 2019-03-12 DIAGNOSIS — E119 Type 2 diabetes mellitus without complications: Secondary | ICD-10-CM

## 2019-03-12 LAB — CMP (CANCER CENTER ONLY)
ALT: 16 U/L (ref 0–44)
AST: 21 U/L (ref 15–41)
Albumin: 3.5 g/dL (ref 3.5–5.0)
Alkaline Phosphatase: 100 U/L (ref 38–126)
Anion gap: 13 (ref 5–15)
BUN: 12 mg/dL (ref 8–23)
CO2: 16 mmol/L — ABNORMAL LOW (ref 22–32)
Calcium: 9.6 mg/dL (ref 8.9–10.3)
Chloride: 104 mmol/L (ref 98–111)
Creatinine: 0.89 mg/dL (ref 0.44–1.00)
GFR, Est AFR Am: >60 mL/min (ref >60)
GFR, Estimated: >60 mL/min (ref >60)
Glucose, Bld: 241 mg/dL — ABNORMAL HIGH (ref 70–99)
Potassium: 4.4 mmol/L (ref 3.5–5.1)
Sodium: 133 mmol/L — ABNORMAL LOW (ref 135–145)
Total Bilirubin: 0.6 mg/dL (ref 0.3–1.2)
Total Protein: 7.6 g/dL (ref 6.5–8.1)

## 2019-03-12 LAB — CBC WITH DIFFERENTIAL (CANCER CENTER ONLY)
Abs Immature Granulocytes: 0.07 10*3/uL (ref 0.00–0.07)
Basophils Absolute: 0 10*3/uL (ref 0.0–0.1)
Basophils Relative: 0 %
Eosinophils Absolute: 0 10*3/uL (ref 0.0–0.5)
Eosinophils Relative: 0 %
HCT: 35.8 % — ABNORMAL LOW (ref 36.0–46.0)
Hemoglobin: 11 g/dL — ABNORMAL LOW (ref 12.0–15.0)
Immature Granulocytes: 2 %
Lymphocytes Relative: 24 %
Lymphs Abs: 1.2 10*3/uL (ref 0.7–4.0)
MCH: 24.3 pg — ABNORMAL LOW (ref 26.0–34.0)
MCHC: 30.7 g/dL (ref 30.0–36.0)
MCV: 79.2 fL — ABNORMAL LOW (ref 80.0–100.0)
Monocytes Absolute: 0.3 10*3/uL (ref 0.1–1.0)
Monocytes Relative: 6 %
Neutro Abs: 3.3 10*3/uL (ref 1.7–7.7)
Neutrophils Relative %: 68 %
Platelet Count: 339 10*3/uL (ref 150–400)
RBC: 4.52 MIL/uL (ref 3.87–5.11)
RDW: 18.8 % — ABNORMAL HIGH (ref 11.5–15.5)
WBC Count: 4.8 10*3/uL (ref 4.0–10.5)
nRBC: 0 % (ref 0.0–0.2)

## 2019-03-12 LAB — MAGNESIUM: Magnesium: 1.7 mg/dL (ref 1.7–2.4)

## 2019-03-12 MED ORDER — PALONOSETRON HCL INJECTION 0.25 MG/5ML
0.2500 mg | Freq: Once | INTRAVENOUS | Status: AC
  Administered 2019-03-12: 0.25 mg via INTRAVENOUS

## 2019-03-12 MED ORDER — SODIUM CHLORIDE 0.9 % IV SOLN
Freq: Once | INTRAVENOUS | Status: AC
  Administered 2019-03-12: 10:00:00 via INTRAVENOUS
  Filled 2019-03-12: qty 250

## 2019-03-12 MED ORDER — PALONOSETRON HCL INJECTION 0.25 MG/5ML
INTRAVENOUS | Status: AC
  Filled 2019-03-12: qty 5

## 2019-03-12 MED ORDER — SODIUM CHLORIDE 0.9 % IV SOLN
75.0000 mg/m2 | Freq: Once | INTRAVENOUS | Status: AC
  Administered 2019-03-12: 14:00:00 141 mg via INTRAVENOUS
  Filled 2019-03-12: qty 141

## 2019-03-12 MED ORDER — POTASSIUM CHLORIDE 2 MEQ/ML IV SOLN
Freq: Once | INTRAVENOUS | Status: AC
  Administered 2019-03-12: 11:00:00 via INTRAVENOUS
  Filled 2019-03-12: qty 10

## 2019-03-12 MED ORDER — SODIUM CHLORIDE 0.9 % IV SOLN
530.0000 mg/m2 | Freq: Once | INTRAVENOUS | Status: AC
  Administered 2019-03-12: 13:00:00 1000 mg via INTRAVENOUS
  Filled 2019-03-12: qty 40

## 2019-03-12 MED ORDER — SODIUM CHLORIDE 0.9 % IV SOLN
Freq: Once | INTRAVENOUS | Status: AC
  Administered 2019-03-12: 12:00:00 via INTRAVENOUS
  Filled 2019-03-12: qty 5

## 2019-03-12 NOTE — Progress Notes (Signed)
[facility] [phone]   [phone]  OFFICE PROGRESS NOTE  [name], MD [place] [address]  DIAGNOSIS: stage IIIA (T2a, N2, M0)non-small cell lung cancer, adenocarcinoma presented with right lower lobe lung mass and mediastinal lymphadenopathy. Diagnosed in May 2020.   Biomarker Findings Microsatellite status - MS-Stable Tumor Mutational Burden - 9 Muts/Mb Genomic Findings For a complete list of the genes assayed, please refer to the Appendix. NF1 K33* TP53 R158P 8 Disease relevant genes with no reportable alterations: ALK, BRAF, EGFR, ERBB2, KRAS, MET, RET, ROS1  PDL 1 expression negative  PRIOR THERAPY:  Status post right lower lobectomy with lymph node dissection on Jan 07, 2019 under the care of Dr. Roxan Hockey.  CURRENT THERAPY: Adjuvant platinum based regimen with cisplatin 75 mg/M2 and Alimta 500 mg/M2 every 3 weeks. First dose on June 30th, 2020.  Status post 1 cycle.  INTERVAL HISTORY: Sandy [name] 63 y.o. female returns to the clinic today for follow-up visit.  The patient is feeling fine today with no concerning complaints.  She was recently treated with a course of Keflex for surgical wound infection.  She denied having any current chest pain, shortness of breath but continues to have dry cough with no hemoptysis.  She denied having any recent weight loss or night sweats.  She has no nausea, vomiting, diarrhea or constipation.  She denied having any headache or visual changes.  She tolerated the first cycle of her treatment with cisplatin and Alimta fairly well.  She is here for evaluation before starting cycle #2.  MEDICAL HISTORY: Past Medical History:  Diagnosis Date  . Arthritis   . Cirrhosis ([facility])   . COPD (chronic obstructive pulmonary disease) ([facility])   . Dyslipidemia   . Dyspnea   . Esophageal varices ([facility])   . History of blood transfusion   . History of hiatal hernia   .  Hypertension   . Pneumonia   . Type 2 diabetes mellitus (HCC)    Type II    ALLERGIES:  is allergic to codeine and nsaids.  MEDICATIONS:  Current Outpatient Medications  Medication Sig Dispense Refill  . acetaminophen (TYLENOL) 500 MG tablet Take 1,000 mg by mouth every 6 (six) hours as needed for moderate pain or headache.    Marland Kitchen atorvastatin (LIPITOR) 10 MG tablet Take 10 mg by mouth at bedtime.     Marland Kitchen dexamethasone (DECADRON) 4 MG tablet Take 1 (4 mg) tablet twice a day the day before, the day of, and the day after chemotherapy 24 tablet 0  . diphenhydrAMINE (BENADRYL) 25 MG tablet Take 50 mg by mouth at bedtime as needed.    . folic acid (FOLVITE) 1 MG tablet Take 1 tablet (1 mg total) by mouth daily. 30 tablet 2  . losartan (COZAAR) 50 MG tablet Take 1 tablet (50 mg total) by mouth daily. 30 tablet 3  . magnesium oxide (MAG-OX) 400 (241.3 Mg) MG tablet Take 2 tablets (800 mg total) by mouth 2 (two) times daily. 60 tablet 2  . metFORMIN (GLUCOPHAGE-XR) 500 MG 24 hr tablet Take 500 mg by mouth at bedtime.    . nadolol (CORGARD) 40 MG tablet Take 40 mg by mouth at bedtime.    Marland Kitchen omeprazole (PRILOSEC) 20 MG capsule Take 20 mg by mouth daily.     . ondansetron (ZOFRAN) 4 MG tablet Take 1 tablet (4 mg total) by mouth every 6 (six) hours as needed for  nausea or vomiting. (Patient not taking: Reported on 02/12/2019) 20 tablet 0  . prochlorperazine (COMPAZINE) 10 MG tablet Take 1 tablet (10 mg total) by mouth every 6 (six) hours as needed for nausea or vomiting. 30 tablet 0  . Tetrahydrozoline HCl (VISINE OP) Place 1 drop into both eyes daily as needed (irritation).     No current facility-administered medications for this visit.     SURGICAL HISTORY:  Past Surgical History:  Procedure Laterality Date  . ABDOMINAL HYSTERECTOMY    . ANTERIOR CRUCIATE LIGAMENT REPAIR Left    x 2  . ESOPHAGOGASTRODUODENOSCOPY    . LOBECTOMY Right 01/07/2019   Procedure: RIGHT LOWER LOBE LOBECTOMY AND  INTERCOSTAL NERVE BLOCK;  Surgeon: Melrose Nakayama, MD;  Location: [facility];  Service: Thoracic;  Laterality: Right;  Marland Kitchen VIDEO ASSISTED THORACOSCOPY (VATS)/WEDGE RESECTION Right 01/07/2019   Procedure: VIDEO ASSISTED THORACOSCOPY (VATS) RIGHT LOWER LOBE WEDGE RESECTION;  Surgeon: Melrose Nakayama, MD;  Location: [facility];  Service: Thoracic;  Laterality: Right;    REVIEW OF SYSTEMS:  A comprehensive review of systems was negative except for: Respiratory: positive for cough   PHYSICAL EXAMINATION: General appearance: alert, cooperative and no distress Head: Normocephalic, without obvious abnormality, atraumatic Neck: no adenopathy, no JVD, supple, symmetrical, trachea midline and thyroid not enlarged, symmetric, no tenderness/mass/nodules Lymph nodes: Cervical, supraclavicular, and axillary nodes normal. Resp: clear to auscultation bilaterally Back: symmetric, no curvature. ROM normal. No CVA tenderness. Cardio: regular rate and rhythm, S1, S2 normal, no murmur, click, rub or gallop GI: soft, non-tender; bowel sounds normal; no masses,  no organomegaly Extremities: extremities normal, atraumatic, no cyanosis or edema  ECOG PERFORMANCE STATUS: 1 - Symptomatic but completely ambulatory  Blood pressure (!) 167/79, pulse 80, temperature 99.1 F (37.3 C), temperature source Oral, resp. rate 20, height 5' 4"  (1.626 m), weight 175 lb 11.2 oz (79.7 kg), SpO2 100 %.  LABORATORY DATA: Lab Results  Component Value Date   WBC 4.8 03/12/2019   HGB 11.0 (L) 03/12/2019   HCT 35.8 (L) 03/12/2019   MCV 79.2 (L) 03/12/2019   PLT 339 03/12/2019      Chemistry      Component Value Date/Time   NA 139 03/05/2019 1440   K 4.4 03/05/2019 1440   CL 107 03/05/2019 1440   CO2 24 03/05/2019 1440   BUN 8 03/05/2019 1440   CREATININE 0.74 03/05/2019 1440      Component Value Date/Time   CALCIUM 8.9 03/05/2019 1440   ALKPHOS 96 03/05/2019 1440   AST 23 03/05/2019 1440   ALT 22 03/05/2019 1440    BILITOT 0.4 03/05/2019 1440       RADIOGRAPHIC STUDIES: No results found.  ASSESSMENT AND PLAN: This is a very pleasant 62 years old white female with a stage IIIa non-small cell lung cancer, adenocarcinoma with no actionable mutations status post right lower lobectomy with lymph node dissection under the care of Dr. Roxan Hockey on Jan 07, 2019. The patient is currently undergoing adjuvant systemic chemotherapy with cisplatin and Alimta status post 1 cycle. She tolerated the first cycle of her treatment well with no concerning adverse effects. I recommended for her to proceed with cycle #2 today as planned. The patient will come back for follow-up visit in 3 weeks for evaluation before starting cycle #3. For the hypomagnesemia she will continue on magnesium oxide to 400 mg p.o. 4 times daily.  We will monitor her electrolytes closely during her treatment. She was advised to call immediately if  she has any concerning symptoms in the interval. The patient voices understanding of current disease status and treatment options and is in agreement with the current care plan. All questions were answered. The patient knows to call the clinic with any problems, questions or concerns. We can certainly see the patient much sooner if necessary.  I spent 10 minutes counseling the patient face to face. The total time spent in the appointment was 15 minutes.  Disclaimer: This note was dictated with voice recognition software. Similar sounding words can inadvertently be transcribed and may not be corrected upon review.

## 2019-03-12 NOTE — Patient Instructions (Signed)
[facility] Discharge Instructions for Patients Receiving Chemotherapy  Today you received the following chemotherapy agents: Alimta & Cisplatin   To help prevent nausea and vomiting after your treatment, we encourage you to take your nausea medication as directed.    If you develop nausea and vomiting that is not controlled by your nausea medication, call the clinic.   BELOW ARE SYMPTOMS THAT SHOULD BE REPORTED IMMEDIATELY:  *FEVER GREATER THAN 100.5 F  *CHILLS WITH OR WITHOUT FEVER  NAUSEA AND VOMITING THAT IS NOT CONTROLLED WITH YOUR NAUSEA MEDICATION  *UNUSUAL SHORTNESS OF BREATH  *UNUSUAL BRUISING OR BLEEDING  TENDERNESS IN MOUTH AND THROAT WITH OR WITHOUT PRESENCE OF ULCERS  *URINARY PROBLEMS  *BOWEL PROBLEMS  UNUSUAL RASH Items with * indicate a potential emergency and should be followed up as soon as possible.  Feel free to call the clinic should you have any questions or concerns. The clinic phone number is [phone].  Please show the [facility] at check-in to the Emergency Department and triage nurse.  Coronavirus (COVID-19) Are you at risk?  Are you at risk for the Coronavirus (COVID-19)?  To be considered HIGH RISK for Coronavirus (COVID-19), you have to meet the following criteria:  . Traveled to [place], [place], [place], [place] or [place]; or in the [place] to [place], [place], [place], or [place]; and have fever, cough, and shortness of breath within the last 2 weeks of travel OR . Been in close contact with a person diagnosed with COVID-19 within the last 2 weeks and have fever, cough, and shortness of breath . IF YOU DO NOT MEET THESE CRITERIA, YOU ARE CONSIDERED LOW RISK FOR COVID-19.  What to do if you are HIGH RISK for COVID-19?  Marland Kitchen If you are having a medical emergency, call 911. . Seek medical care right away. Before you go to a doctor's office, urgent care or emergency department, call ahead and tell them  about your recent travel, contact with someone diagnosed with COVID-19, and your symptoms. You should receive instructions from your physician's office regarding next steps of care.  . When you arrive at healthcare provider, tell the healthcare staff immediately you have returned from visiting [place], [place], [place], [place] or [place]; or traveled in the [place] to [place], [place], [place], or [place]; in the last two weeks or you have been in close contact with a person diagnosed with COVID-19 in the last 2 weeks.   . Tell the health care staff about your symptoms: fever, cough and shortness of breath. . After you have been seen by a medical provider, you will be either: o Tested for (COVID-19) and discharged home on quarantine except to seek medical care if symptoms worsen, and asked to  - Stay home and avoid contact with others until you get your results (4-5 days)  - Avoid travel on public transportation if possible (such as bus, train, or airplane) or o Sent to the Emergency Department by EMS for evaluation, COVID-19 testing, and possible admission depending on your condition and test results.  What to do if you are LOW RISK for COVID-19?  Reduce your risk of any infection by using the same precautions used for avoiding the common cold or flu:  Marland Kitchen Wash your hands often with soap and warm water for at least 20 seconds.  If soap and water are not readily available, use an alcohol-based hand sanitizer with at least 60% alcohol.  Marland Kitchen  If coughing or sneezing, cover your mouth and nose by coughing or sneezing into the elbow areas of your shirt or coat, into a tissue or into your sleeve (not your hands). . Avoid shaking hands with others and consider head nods or verbal greetings only. . Avoid touching your eyes, nose, or mouth with unwashed hands.  . Avoid close contact with people who are sick. . Avoid places or events with large numbers of people in one location, like concerts or  sporting events. . Carefully consider travel plans you have or are making. . If you are planning any travel outside or inside the [place], visit the CDC's Travelers' Health webpage for the latest health notices. . If you have some symptoms but not all symptoms, continue to monitor at home and seek medical attention if your symptoms worsen. . If you are having a medical emergency, call 911.   [place] / e-Visit: [email or web address]         [facility]: [facility]: [phone]                   [facility]: [phone]

## 2019-03-19 ENCOUNTER — Inpatient Hospital Stay: Payer: Medicare Other

## 2019-03-19 ENCOUNTER — Telehealth: Payer: Self-pay | Admitting: Medical Oncology

## 2019-03-19 ENCOUNTER — Other Ambulatory Visit: Payer: Self-pay

## 2019-03-19 DIAGNOSIS — Z5111 Encounter for antineoplastic chemotherapy: Secondary | ICD-10-CM | POA: Diagnosis not present

## 2019-03-19 DIAGNOSIS — C3491 Malignant neoplasm of unspecified part of right bronchus or lung: Secondary | ICD-10-CM

## 2019-03-19 LAB — CBC WITH DIFFERENTIAL (CANCER CENTER ONLY)
Abs Immature Granulocytes: 0.01 10*3/uL (ref 0.00–0.07)
Basophils Absolute: 0 10*3/uL (ref 0.0–0.1)
Basophils Relative: 1 %
Eosinophils Absolute: 0 10*3/uL (ref 0.0–0.5)
Eosinophils Relative: 1 %
HCT: 38.8 % (ref 36.0–46.0)
Hemoglobin: 11.8 g/dL — ABNORMAL LOW (ref 12.0–15.0)
Immature Granulocytes: 0 %
Lymphocytes Relative: 58 %
Lymphs Abs: 1.8 10*3/uL (ref 0.7–4.0)
MCH: 24.7 pg — ABNORMAL LOW (ref 26.0–34.0)
MCHC: 30.4 g/dL (ref 30.0–36.0)
MCV: 81.2 fL (ref 80.0–100.0)
Monocytes Absolute: 0.4 10*3/uL (ref 0.1–1.0)
Monocytes Relative: 12 %
Neutro Abs: 0.9 10*3/uL — ABNORMAL LOW (ref 1.7–7.7)
Neutrophils Relative %: 28 %
Platelet Count: 294 10*3/uL (ref 150–400)
RBC: 4.78 MIL/uL (ref 3.87–5.11)
RDW: 18.5 % — ABNORMAL HIGH (ref 11.5–15.5)
WBC Count: 3.1 10*3/uL — ABNORMAL LOW (ref 4.0–10.5)
nRBC: 0 % (ref 0.0–0.2)

## 2019-03-19 LAB — MAGNESIUM: Magnesium: 1.2 mg/dL — CL (ref 1.7–2.4)

## 2019-03-19 LAB — CMP (CANCER CENTER ONLY)
ALT: 52 U/L — ABNORMAL HIGH (ref 0–44)
AST: 36 U/L (ref 15–41)
Albumin: 3.4 g/dL — ABNORMAL LOW (ref 3.5–5.0)
Alkaline Phosphatase: 87 U/L (ref 38–126)
Anion gap: 9 (ref 5–15)
BUN: 11 mg/dL (ref 8–23)
CO2: 24 mmol/L (ref 22–32)
Calcium: 9.3 mg/dL (ref 8.9–10.3)
Chloride: 102 mmol/L (ref 98–111)
Creatinine: 0.78 mg/dL (ref 0.44–1.00)
GFR, Est AFR Am: >60 mL/min (ref >60)
GFR, Estimated: >60 mL/min (ref >60)
Glucose, Bld: 114 mg/dL — ABNORMAL HIGH (ref 70–99)
Potassium: 4.9 mmol/L (ref 3.5–5.1)
Sodium: 135 mmol/L (ref 135–145)
Total Bilirubin: 0.6 mg/dL (ref 0.3–1.2)
Total Protein: 7.1 g/dL (ref 6.5–8.1)

## 2019-03-19 NOTE — Telephone Encounter (Signed)
hypomagnesemia-LVM to increase mag oxide to 800 mg tid.

## 2019-03-25 ENCOUNTER — Other Ambulatory Visit: Payer: Self-pay | Admitting: Thoracic Surgery (Cardiothoracic Vascular Surgery)

## 2019-03-25 DIAGNOSIS — C3491 Malignant neoplasm of unspecified part of right bronchus or lung: Secondary | ICD-10-CM

## 2019-03-26 ENCOUNTER — Encounter: Payer: Self-pay | Admitting: Thoracic Surgery (Cardiothoracic Vascular Surgery)

## 2019-03-26 ENCOUNTER — Ambulatory Visit
Admission: RE | Admit: 2019-03-26 | Discharge: 2019-03-26 | Disposition: A | Discharge status: Home / Self Care | Payer: Medicare Other | Source: Ambulatory Visit | Attending: Thoracic Surgery (Cardiothoracic Vascular Surgery) | Admitting: Thoracic Surgery (Cardiothoracic Vascular Surgery)

## 2019-03-26 ENCOUNTER — Telehealth: Payer: Self-pay | Admitting: *Deleted

## 2019-03-26 ENCOUNTER — Inpatient Hospital Stay: Payer: Medicare Other | Attending: Internal Medicine

## 2019-03-26 ENCOUNTER — Other Ambulatory Visit: Payer: Self-pay

## 2019-03-26 ENCOUNTER — Ambulatory Visit (INDEPENDENT_AMBULATORY_CARE_PROVIDER_SITE_OTHER): Payer: Self-pay | Admitting: Thoracic Surgery (Cardiothoracic Vascular Surgery)

## 2019-03-26 VITALS — BP 141/83 | HR 98 | Temp 92.0°F | Resp 18 | Ht 64.0 in | Wt 171.4 lb

## 2019-03-26 DIAGNOSIS — R9389 Abnormal findings on diagnostic imaging of other specified body structures: Secondary | ICD-10-CM | POA: Insufficient documentation

## 2019-03-26 DIAGNOSIS — R12 Heartburn: Secondary | ICD-10-CM | POA: Diagnosis not present

## 2019-03-26 DIAGNOSIS — J449 Chronic obstructive pulmonary disease, unspecified: Secondary | ICD-10-CM | POA: Diagnosis not present

## 2019-03-26 DIAGNOSIS — C3431 Malignant neoplasm of lower lobe, right bronchus or lung: Secondary | ICD-10-CM | POA: Diagnosis present

## 2019-03-26 DIAGNOSIS — M1990 Unspecified osteoarthritis, unspecified site: Secondary | ICD-10-CM | POA: Diagnosis not present

## 2019-03-26 DIAGNOSIS — C3491 Malignant neoplasm of unspecified part of right bronchus or lung: Secondary | ICD-10-CM

## 2019-03-26 DIAGNOSIS — Z5111 Encounter for antineoplastic chemotherapy: Secondary | ICD-10-CM | POA: Insufficient documentation

## 2019-03-26 DIAGNOSIS — R590 Localized enlarged lymph nodes: Secondary | ICD-10-CM | POA: Diagnosis not present

## 2019-03-26 DIAGNOSIS — Z886 Allergy status to analgesic agent status: Secondary | ICD-10-CM | POA: Insufficient documentation

## 2019-03-26 DIAGNOSIS — Z79899 Other long term (current) drug therapy: Secondary | ICD-10-CM | POA: Insufficient documentation

## 2019-03-26 DIAGNOSIS — Z885 Allergy status to narcotic agent status: Secondary | ICD-10-CM | POA: Insufficient documentation

## 2019-03-26 DIAGNOSIS — E119 Type 2 diabetes mellitus without complications: Secondary | ICD-10-CM | POA: Diagnosis not present

## 2019-03-26 DIAGNOSIS — Z902 Acquired absence of lung [part of]: Secondary | ICD-10-CM

## 2019-03-26 LAB — CBC WITH DIFFERENTIAL (CANCER CENTER ONLY)
Abs Immature Granulocytes: 0.01 10*3/uL (ref 0.00–0.07)
Basophils Absolute: 0 10*3/uL (ref 0.0–0.1)
Basophils Relative: 1 %
Eosinophils Absolute: 0.1 10*3/uL (ref 0.0–0.5)
Eosinophils Relative: 2 %
HCT: 37.7 % (ref 36.0–46.0)
Hemoglobin: 12 g/dL (ref 12.0–15.0)
Immature Granulocytes: 0 %
Lymphocytes Relative: 43 %
Lymphs Abs: 1.6 10*3/uL (ref 0.7–4.0)
MCH: 24.9 pg — ABNORMAL LOW (ref 26.0–34.0)
MCHC: 31.8 g/dL (ref 30.0–36.0)
MCV: 78.2 fL — ABNORMAL LOW (ref 80.0–100.0)
Monocytes Absolute: 0.6 10*3/uL (ref 0.1–1.0)
Monocytes Relative: 16 %
Neutro Abs: 1.4 10*3/uL — ABNORMAL LOW (ref 1.7–7.7)
Neutrophils Relative %: 38 %
Platelet Count: 151 10*3/uL (ref 150–400)
RBC: 4.82 MIL/uL (ref 3.87–5.11)
RDW: 20.2 % — ABNORMAL HIGH (ref 11.5–15.5)
WBC Count: 3.8 10*3/uL — ABNORMAL LOW (ref 4.0–10.5)
nRBC: 0 % (ref 0.0–0.2)

## 2019-03-26 LAB — CMP (CANCER CENTER ONLY)
ALT: 24 U/L (ref 0–44)
AST: 28 U/L (ref 15–41)
Albumin: 3.5 g/dL (ref 3.5–5.0)
Alkaline Phosphatase: 105 U/L (ref 38–126)
Anion gap: 9 (ref 5–15)
BUN: 5 mg/dL — ABNORMAL LOW (ref 8–23)
CO2: 25 mmol/L (ref 22–32)
Calcium: 9.5 mg/dL (ref 8.9–10.3)
Chloride: 101 mmol/L (ref 98–111)
Creatinine: 1.05 mg/dL — ABNORMAL HIGH (ref 0.44–1.00)
GFR, Est AFR Am: >60 mL/min (ref >60)
GFR, Estimated: 57 mL/min — ABNORMAL LOW (ref >60)
Glucose, Bld: 148 mg/dL — ABNORMAL HIGH (ref 70–99)
Potassium: 4.7 mmol/L (ref 3.5–5.1)
Sodium: 135 mmol/L (ref 135–145)
Total Bilirubin: 0.6 mg/dL (ref 0.3–1.2)
Total Protein: 7.2 g/dL (ref 6.5–8.1)

## 2019-03-26 LAB — MAGNESIUM: Magnesium: 1.3 mg/dL — CL (ref 1.7–2.4)

## 2019-03-26 NOTE — Telephone Encounter (Signed)
Received call report from [facility].  "Today's Mg+ = 1.3."  Note with results given to provider.   Currently no response. Next scheduled appt. Lab/F/U/infusion 04-02-2019 beginning at 8:30 am.

## 2019-03-26 NOTE — Progress Notes (Signed)
[address]       [place] [address]             [phone]     HPI: Sandy [name] returns for scheduled follow-up  Sandy [name] is a 63 year old woman with history of tobacco abuse, type 2 diabetes, cirrhosis, varices, hypertension, hyperlipidemia, and arthritis.  She was found to have a nodule on the low-dose CT for lung cancer screening.  On follow-up CT the nodule had increased in size.  PET/CT showed the nodule was hypermetabolic with no evidence of metastatic disease.  I did a thoracoscopic right lower lobectomy on 01/07/2019.  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  She did well initially postoperatively and went home on day 3.  She later developed a suture abscess in her incision.  Otherwise her course has been unremarkable.  She denies any incisional pain.  She does have a chronic cough that is been present since the surgery.  She has had 2 of a planned 4 cycles of chemotherapy.  She is tolerated that well with just some fatigue but no nausea.  Past Medical History:  Diagnosis Date  . Adenocarcinoma of lung, stage 3, right ([facility]) 2020  . Arthritis   . Cirrhosis ([facility])   . COPD (chronic obstructive pulmonary disease) ([facility])   . Dyslipidemia   . Dyspnea   . Esophageal varices ([facility])   . History of blood transfusion   . History of hiatal hernia   . Hypertension   . Pneumonia   . Type 2 diabetes mellitus ([facility])    Type II    Current Outpatient Medications  Medication Sig Dispense Refill  . acetaminophen (TYLENOL) 500 MG tablet Take 1,000 mg by mouth every 6 (six) hours as needed for moderate pain or headache.    Marland Kitchen atorvastatin (LIPITOR) 10 MG tablet Take 10 mg by mouth at bedtime.     Marland Kitchen dexamethasone (DECADRON) 4 MG tablet Take 1 (4 mg) tablet twice a day the day before, the day of, and the day after chemotherapy 24 tablet 0  . diphenhydrAMINE (BENADRYL) 25 MG tablet Take 50 mg by mouth at bedtime as needed.    . folic acid  (FOLVITE) 1 MG tablet Take 1 tablet (1 mg total) by mouth daily. 30 tablet 2  . losartan (COZAAR) 50 MG tablet Take 1 tablet (50 mg total) by mouth daily. 30 tablet 3  . magnesium oxide (MAG-OX) 400 (241.3 Mg) MG tablet Take 2 tablets (800 mg total) by mouth 2 (two) times daily. 60 tablet 2  . metFORMIN (GLUCOPHAGE-XR) 500 MG 24 hr tablet Take 500 mg by mouth at bedtime.    . nadolol (CORGARD) 40 MG tablet Take 40 mg by mouth at bedtime.    Marland Kitchen omeprazole (PRILOSEC) 20 MG capsule Take 20 mg by mouth daily.     . Tetrahydrozoline HCl (VISINE OP) Place 1 drop into both eyes daily as needed (irritation).    . prochlorperazine (COMPAZINE) 10 MG tablet Take 1 tablet (10 mg total) by mouth every 6 (six) hours as needed for nausea or vomiting. (Patient not taking: Reported on 03/26/2019) 30 tablet 0   No current facility-administered medications for this visit.     Physical Exam BP (!) 141/83 (BP Location: Right Arm, Patient Position: Sitting, Cuff Size: Normal)   Pulse 98 Comment: thermal  Temp (!) 92 F (33.3 C)   Resp 18   Ht 5\' 4"  (1.626 m)  Wt 171 lb 6.4 oz (77.7 kg)   SpO2 97% Comment: RA  BMI 29.26 kg/m  63 year old woman in no acute distress Alert and oriented x3 with no focal deficits Lungs diminished at right base, otherwise clear Incisions well-healed Cardiac regular rate and rhythm  Diagnostic Tests: CHEST - 2 VIEW  COMPARISON:  01/22/2019  FINDINGS: Stable volume loss in right hemithorax compatible with prior surgery. Stable mediastinal shift towards the right. Left lung is clear. Negative for a pneumothorax. No significant pleural fluid. Heart size is normal and stable.  IMPRESSION: 1. No acute chest findings. 2. Volume loss in the right hemithorax compatible with prior surgery.   Electronically Signed   By: Markus Daft M.D.   On: 03/26/2019 12:39 I personally reviewed the chest x-ray images and concur with the findings noted above  Impression: Sandy [name] is a 63 year old woman with a history of tobacco abuse who was found to have a lung nodule on a low-dose screening CT for lung cancer.  It turned out to be T2N2, stage IIIa adenocarcinoma.  The N2 nodes were radiographically occult.  She has done well from a surgical standpoint.  She did have suture abscess, but otherwise has been completely unremarkable recovery.  She does still have a cough and this is a frequent finding after lobectomy and often last for months before ultimately improving.  She is receiving adjuvant chemotherapy which is to be followed by adjuvant radiation.  She has tolerated chemotherapy well so far.  There are no restrictions on her activities from my standpoint.  Plan: Follow-up as scheduled with Dr. Julien Nordmann and radiation oncology Return in 9 months with PA and lateral chest x-ray for 1 year visit.  Melrose Nakayama, MD [facility] [phone]

## 2019-04-01 ENCOUNTER — Other Ambulatory Visit: Payer: Self-pay | Admitting: Radiation Oncology

## 2019-04-02 ENCOUNTER — Other Ambulatory Visit: Payer: Self-pay

## 2019-04-02 ENCOUNTER — Inpatient Hospital Stay: Payer: Medicare Other

## 2019-04-02 ENCOUNTER — Inpatient Hospital Stay (HOSPITAL_BASED_OUTPATIENT_CLINIC_OR_DEPARTMENT_OTHER): Payer: Medicare Other | Admitting: Physician Assistant

## 2019-04-02 ENCOUNTER — Encounter: Payer: Self-pay | Admitting: Physician Assistant

## 2019-04-02 ENCOUNTER — Telehealth: Payer: Self-pay | Admitting: Physician Assistant

## 2019-04-02 DIAGNOSIS — C3491 Malignant neoplasm of unspecified part of right bronchus or lung: Secondary | ICD-10-CM

## 2019-04-02 DIAGNOSIS — Z5111 Encounter for antineoplastic chemotherapy: Secondary | ICD-10-CM | POA: Diagnosis not present

## 2019-04-02 LAB — CBC WITH DIFFERENTIAL (CANCER CENTER ONLY)
Abs Immature Granulocytes: 0.02 10*3/uL (ref 0.00–0.07)
Basophils Absolute: 0 10*3/uL (ref 0.0–0.1)
Basophils Relative: 0 %
Eosinophils Absolute: 0 10*3/uL (ref 0.0–0.5)
Eosinophils Relative: 0 %
HCT: 33.6 % — ABNORMAL LOW (ref 36.0–46.0)
Hemoglobin: 10.9 g/dL — ABNORMAL LOW (ref 12.0–15.0)
Immature Granulocytes: 0 %
Lymphocytes Relative: 21 %
Lymphs Abs: 0.9 10*3/uL (ref 0.7–4.0)
MCH: 26.3 pg (ref 26.0–34.0)
MCHC: 32.4 g/dL (ref 30.0–36.0)
MCV: 81 fL (ref 80.0–100.0)
Monocytes Absolute: 0.3 10*3/uL (ref 0.1–1.0)
Monocytes Relative: 6 %
Neutro Abs: 3.2 10*3/uL (ref 1.7–7.7)
Neutrophils Relative %: 73 %
Platelet Count: 179 10*3/uL (ref 150–400)
RBC: 4.15 MIL/uL (ref 3.87–5.11)
RDW: 21.6 % — ABNORMAL HIGH (ref 11.5–15.5)
WBC Count: 4.5 10*3/uL (ref 4.0–10.5)
nRBC: 0 % (ref 0.0–0.2)

## 2019-04-02 LAB — CMP (CANCER CENTER ONLY)
ALT: 23 U/L (ref 0–44)
AST: 26 U/L (ref 15–41)
Albumin: 3.7 g/dL (ref 3.5–5.0)
Alkaline Phosphatase: 84 U/L (ref 38–126)
Anion gap: 9 (ref 5–15)
BUN: 14 mg/dL (ref 8–23)
CO2: 21 mmol/L — ABNORMAL LOW (ref 22–32)
Calcium: 9.3 mg/dL (ref 8.9–10.3)
Chloride: 101 mmol/L (ref 98–111)
Creatinine: 0.61 mg/dL (ref 0.44–1.00)
GFR, Est AFR Am: >60 mL/min (ref >60)
GFR, Estimated: >60 mL/min (ref >60)
Glucose, Bld: 182 mg/dL — ABNORMAL HIGH (ref 70–99)
Potassium: 4.3 mmol/L (ref 3.5–5.1)
Sodium: 131 mmol/L — ABNORMAL LOW (ref 135–145)
Total Bilirubin: 0.8 mg/dL (ref 0.3–1.2)
Total Protein: 7.4 g/dL (ref 6.5–8.1)

## 2019-04-02 LAB — MAGNESIUM: Magnesium: 1.6 mg/dL — ABNORMAL LOW (ref 1.7–2.4)

## 2019-04-02 MED ORDER — PALONOSETRON HCL INJECTION 0.25 MG/5ML
0.2500 mg | Freq: Once | INTRAVENOUS | Status: AC
  Administered 2019-04-02: 13:00:00 0.25 mg via INTRAVENOUS

## 2019-04-02 MED ORDER — SODIUM CHLORIDE 0.9% FLUSH
10.0000 mL | INTRAVENOUS | Status: DC | PRN
  Filled 2019-04-02: qty 10

## 2019-04-02 MED ORDER — CYANOCOBALAMIN 1000 MCG/ML IJ SOLN
INTRAMUSCULAR | Status: AC
  Filled 2019-04-02: qty 1

## 2019-04-02 MED ORDER — POTASSIUM CHLORIDE 2 MEQ/ML IV SOLN
Freq: Once | INTRAVENOUS | Status: AC
  Administered 2019-04-02: 11:00:00 via INTRAVENOUS
  Filled 2019-04-02: qty 10

## 2019-04-02 MED ORDER — PALONOSETRON HCL INJECTION 0.25 MG/5ML
INTRAVENOUS | Status: AC
  Filled 2019-04-02: qty 5

## 2019-04-02 MED ORDER — CYANOCOBALAMIN 1000 MCG/ML IJ SOLN
1000.0000 ug | Freq: Once | INTRAMUSCULAR | Status: AC
  Administered 2019-04-02: 1000 ug via INTRAMUSCULAR

## 2019-04-02 MED ORDER — SODIUM CHLORIDE 0.9 % IV SOLN
Freq: Once | INTRAVENOUS | Status: AC
  Administered 2019-04-02: 10:00:00 via INTRAVENOUS
  Filled 2019-04-02: qty 250

## 2019-04-02 MED ORDER — MAGNESIUM SULFATE 2 GM/50ML IV SOLN
2.0000 g | Freq: Once | INTRAVENOUS | Status: AC
  Administered 2019-04-02: 2 g via INTRAVENOUS
  Filled 2019-04-02: qty 50

## 2019-04-02 MED ORDER — HEPARIN SOD (PORK) LOCK FLUSH 100 UNIT/ML IV SOLN
500.0000 [IU] | Freq: Once | INTRAVENOUS | Status: DC | PRN
  Filled 2019-04-02: qty 5

## 2019-04-02 MED ORDER — SODIUM CHLORIDE 0.9 % IV SOLN
75.0000 mg/m2 | Freq: Once | INTRAVENOUS | Status: AC
  Administered 2019-04-02: 141 mg via INTRAVENOUS
  Filled 2019-04-02: qty 141

## 2019-04-02 MED ORDER — SODIUM CHLORIDE 0.9 % IV SOLN
530.0000 mg/m2 | Freq: Once | INTRAVENOUS | Status: AC
  Administered 2019-04-02: 1000 mg via INTRAVENOUS
  Filled 2019-04-02: qty 40

## 2019-04-02 MED ORDER — SODIUM CHLORIDE 0.9 % IV SOLN
Freq: Once | INTRAVENOUS | Status: AC
  Administered 2019-04-02: 13:00:00 via INTRAVENOUS
  Filled 2019-04-02: qty 5

## 2019-04-02 NOTE — Patient Instructions (Signed)
[facility] Discharge Instructions for Patients Receiving Chemotherapy  Today you received the following chemotherapy agents Alimta, Cisplatin  To help prevent nausea and vomiting after your treatment, we encourage you to take your nausea medication as directed by your MD   If you develop nausea and vomiting that is not controlled by your nausea medication, call the clinic.   BELOW ARE SYMPTOMS THAT SHOULD BE REPORTED IMMEDIATELY:  *FEVER GREATER THAN 100.5 F  *CHILLS WITH OR WITHOUT FEVER  NAUSEA AND VOMITING THAT IS NOT CONTROLLED WITH YOUR NAUSEA MEDICATION  *UNUSUAL SHORTNESS OF BREATH  *UNUSUAL BRUISING OR BLEEDING  TENDERNESS IN MOUTH AND THROAT WITH OR WITHOUT PRESENCE OF ULCERS  *URINARY PROBLEMS  *BOWEL PROBLEMS  UNUSUAL RASH Items with * indicate a potential emergency and should be followed up as soon as possible.  Feel free to call the clinic should you have any questions or concerns. The clinic phone number is [phone].  Please show the [facility] at check-in to the Emergency Department and triage nurse.  Coronavirus (COVID-19) Are you at risk?  Are you at risk for the Coronavirus (COVID-19)?  To be considered HIGH RISK for Coronavirus (COVID-19), you have to meet the following criteria:  . Traveled to [place], [place], [place], [place] or [place]; or in the [place] to [place], [place], [place], or [place]; and have fever, cough, and shortness of breath within the last 2 weeks of travel OR . Been in close contact with a person diagnosed with COVID-19 within the last 2 weeks and have fever, cough, and shortness of breath . IF YOU DO NOT MEET THESE CRITERIA, YOU ARE CONSIDERED LOW RISK FOR COVID-19.  What to do if you are HIGH RISK for COVID-19?  Marland Kitchen If you are having a medical emergency, call 911. . Seek medical care right away. Before you go to a doctor's office, urgent care or emergency department, call ahead and tell  them about your recent travel, contact with someone diagnosed with COVID-19, and your symptoms. You should receive instructions from your physician's office regarding next steps of care.  . When you arrive at healthcare provider, tell the healthcare staff immediately you have returned from visiting [place], [place], [place], [place] or [place]; or traveled in the [place] to [place], [place], [place], or [place]; in the last two weeks or you have been in close contact with a person diagnosed with COVID-19 in the last 2 weeks.   . Tell the health care staff about your symptoms: fever, cough and shortness of breath. . After you have been seen by a medical provider, you will be either: o Tested for (COVID-19) and discharged home on quarantine except to seek medical care if symptoms worsen, and asked to  - Stay home and avoid contact with others until you get your results (4-5 days)  - Avoid travel on public transportation if possible (such as bus, train, or airplane) or o Sent to the Emergency Department by EMS for evaluation, COVID-19 testing, and possible admission depending on your condition and test results.  What to do if you are LOW RISK for COVID-19?  Reduce your risk of any infection by using the same precautions used for avoiding the common cold or flu:  Marland Kitchen Wash your hands often with soap and warm water for at least 20 seconds.  If soap and water are not readily available, use an alcohol-based hand sanitizer with at least 60% alcohol.  Marland Kitchen  If coughing or sneezing, cover your mouth and nose by coughing or sneezing into the elbow areas of your shirt or coat, into a tissue or into your sleeve (not your hands). . Avoid shaking hands with others and consider head nods or verbal greetings only. . Avoid touching your eyes, nose, or mouth with unwashed hands.  . Avoid close contact with people who are sick. . Avoid places or events with large numbers of people in one location, like concerts or  sporting events. . Carefully consider travel plans you have or are making. . If you are planning any travel outside or inside the [place], visit the CDC's Travelers' Health webpage for the latest health notices. . If you have some symptoms but not all symptoms, continue to monitor at home and seek medical attention if your symptoms worsen. . If you are having a medical emergency, call 911.   [place] / e-Visit: [email or web address]         [facility]: [facility]: [phone]                   [facility]: [phone]

## 2019-04-02 NOTE — Progress Notes (Signed)
Per Cassie, add an additional magnesium 2 grams to treatment today for magnesium level of 1.6 (total Mg today = 3.5 grams). Orders updated.  Demetrius Charity, PharmD, [facility] Pharmac[facility] Phone: [phone] 04/02/2019

## 2019-04-02 NOTE — Telephone Encounter (Signed)
appts already scheduled to the end of treatment plan per 8/11 los.

## 2019-04-02 NOTE — Progress Notes (Signed)
[place] OFFICE PROGRESS NOTE  [name], MD [place] [address]  DIAGNOSIS: Stage IIIA (T2a, N2, M0)non-small cell lung cancer, adenocarcinoma presented with right lower lobe lung mass and mediastinal lymphadenopathy. Diagnosed in May 2020.  Biomarker Findings Microsatellite status - MS-Stable Tumor Mutational Burden - 9 Muts/Mb Genomic Findings For a complete list of the genes assayed, please refer to the Appendix. NF1 K33* TP53 R158P 8 Disease relevant genes with no reportable alterations: ALK, BRAF, EGFR, ERBB2, KRAS, MET, RET, ROS1  PDL 1 expression negative  PRIOR THERAPY: Status post right lower lobectomy with lymph node dissection on Jan 07, 2019 under the care of Dr. Roxan Hockey.  CURRENT THERAPY: Adjuvantplatinum based regimen with cisplatin 75 mg/M2 and Alimta 500 mg/M2 every 3 weeks.First dose onJune 30th, 2020.  Status post 2 cycles.  INTERVAL HISTORY: Sandy [name] 63 y.o. female returns to the clinic today for a follow-up visit. She is feeling well today without any concerning complaints.  She has been tolerating her treatment well without any concerning adverse effects except for hypomagnesemia and mild fatigue.  She is currently prescribed supplemental magnesium oxide p.o. which she has been taking as instructed. She denies any fevers, chills, night sweats, or weight loss.  She denies any chest pain, shortness of breath, or hemoptysis.  She is reporting a dry "hacking" cough which has been present for several months but denies any other associated symptoms.  She denies any current cigarette use.  The patient's history is significant heartburn for which she takes Prilosec and has done so for several years.  She denies any nausea, vomiting, diarrhea, or constipation.  She denies any headache or visual changes.  She is here today for evaluation before starting cycle #3.  MEDICAL HISTORY: Past Medical History:   Diagnosis Date  . Adenocarcinoma of lung, stage 3, right ([place]) 2020  . Arthritis   . Cirrhosis ([place])   . COPD (chronic obstructive pulmonary disease) ([place])   . Dyslipidemia   . Dyspnea   . Esophageal varices ([place])   . History of blood transfusion   . History of hiatal hernia   . Hypertension   . Pneumonia   . Type 2 diabetes mellitus ([facility])    Type II    ALLERGIES:  is allergic to codeine and nsaids.  MEDICATIONS:  Current Outpatient Medications  Medication Sig Dispense Refill  . acetaminophen (TYLENOL) 500 MG tablet Take 1,000 mg by mouth every 6 (six) hours as needed for moderate pain or headache.    Marland Kitchen atorvastatin (LIPITOR) 10 MG tablet Take 10 mg by mouth at bedtime.     Marland Kitchen dexamethasone (DECADRON) 4 MG tablet Take 1 (4 mg) tablet twice a day the day before, the day of, and the day after chemotherapy 24 tablet 0  . diphenhydrAMINE (BENADRYL) 25 MG tablet Take 50 mg by mouth at bedtime as needed.    . folic acid (FOLVITE) 1 MG tablet Take 1 tablet (1 mg total) by mouth daily. 30 tablet 2  . losartan (COZAAR) 50 MG tablet Take 1 tablet (50 mg total) by mouth daily. 30 tablet 3  . magnesium oxide (MAG-OX) 400 (241.3 Mg) MG tablet Take 2 tablets (800 mg total) by mouth 2 (two) times daily. 60 tablet 2  . metFORMIN (GLUCOPHAGE-XR) 500 MG 24 hr tablet Take 500 mg by mouth at bedtime.    . nadolol (CORGARD) 40 MG tablet Take 40 mg by mouth at bedtime.    Marland Kitchen  omeprazole (PRILOSEC) 20 MG capsule Take 20 mg by mouth daily.     . Tetrahydrozoline HCl (VISINE OP) Place 1 drop into both eyes daily as needed (irritation).    . prochlorperazine (COMPAZINE) 10 MG tablet Take 1 tablet (10 mg total) by mouth every 6 (six) hours as needed for nausea or vomiting. (Patient not taking: Reported on 03/26/2019) 30 tablet 0   No current facility-administered medications for this visit.    Facility-Administered Medications Ordered in Other Visits  Medication Dose Route Frequency Provider Last Rate Last  Dose  . CISplatin (PLATINOL) 141 mg in sodium chloride 0.9 % 500 mL chemo infusion  75 mg/m2 (Treatment Plan Recorded) Intravenous Once Curt Bears, MD      . heparin lock flush 100 unit/mL  500 Units Intracatheter Once PRN Curt Bears, MD      . PEMEtrexed (ALIMTA) 1,000 mg in sodium chloride 0.9 % 100 mL chemo infusion  530 mg/m2 (Treatment Plan Recorded) Intravenous Once Curt Bears, MD      . sodium chloride flush (NS) 0.9 % injection 10 mL  10 mL Intracatheter PRN Curt Bears, MD        SURGICAL HISTORY:  Past Surgical History:  Procedure Laterality Date  . ABDOMINAL HYSTERECTOMY    . ANTERIOR CRUCIATE LIGAMENT REPAIR Left    x 2  . ESOPHAGOGASTRODUODENOSCOPY    . LOBECTOMY Right 01/07/2019   Procedure: RIGHT LOWER LOBE LOBECTOMY AND INTERCOSTAL NERVE BLOCK;  Surgeon: Melrose Nakayama, MD;  Location: [facility];  Service: Thoracic;  Laterality: Right;  Marland Kitchen VIDEO ASSISTED THORACOSCOPY (VATS)/WEDGE RESECTION Right 01/07/2019   Procedure: VIDEO ASSISTED THORACOSCOPY (VATS) RIGHT LOWER LOBE WEDGE RESECTION;  Surgeon: Melrose Nakayama, MD;  Location: [facility];  Service: Thoracic;  Laterality: Right;    REVIEW OF SYSTEMS:   Review of Systems  Constitutional: Positive for fatigue. Negative for appetite change, chills,  fever and unexpected weight change.  HENT: Positive for frequent heartburn. Negative for mouth sores, nosebleeds, sore throat and trouble swallowing.   Eyes: Negative for eye problems and icterus.  Respiratory: Positive for dry cough. Negative for hemoptysis, shortness of breath and wheezing.   Cardiovascular: Negative for chest pain and leg swelling.  Gastrointestinal: Negative for abdominal pain, constipation, diarrhea, nausea and vomiting.  Genitourinary: Negative for bladder incontinence, difficulty urinating, dysuria, frequency and hematuria.   Musculoskeletal: Negative for back pain, gait problem, neck pain and neck stiffness.  Skin: Negative for  itching and rash.  Neurological: Negative for dizziness, extremity weakness, gait problem, headaches, light-headedness and seizures.  Hematological: Negative for adenopathy. Does not bruise/bleed easily.  Psychiatric/Behavioral: Negative for confusion, depression and sleep disturbance. The patient is not nervous/anxious.     PHYSICAL EXAMINATION:  Blood pressure (!) 150/64, pulse 72, temperature 98 F (36.7 C), temperature source Temporal, resp. rate 17, height 5' 4" (1.626 m), weight 173 lb 3 oz (78.6 kg), SpO2 100 %.  ECOG PERFORMANCE STATUS: 1 - Symptomatic but completely ambulatory  Physical Exam  Constitutional: Oriented to person, place, and time and well-developed, well-nourished, and in no distress. No distress.  HENT:  Head: Normocephalic and atraumatic.  Mouth/Throat: Oropharynx is clear and moist. No oropharyngeal exudate.  Eyes: Conjunctivae are normal. Right eye exhibits no discharge. Left eye exhibits no discharge. No scleral icterus.  Neck: Normal range of motion. Neck supple.  Cardiovascular: Normal rate, regular rhythm, normal heart sounds and intact distal pulses.   Pulmonary/Chest: Effort normal and breath sounds normal. No respiratory distress. No wheezes. No  rales.  Abdominal: Soft. Bowel sounds are normal. Exhibits no distension and no mass. There is no tenderness.  Musculoskeletal: Normal range of motion. Exhibits no edema.  Lymphadenopathy:    No cervical adenopathy.  Neurological: Alert and oriented to person, place, and time. Exhibits normal muscle tone. Gait normal. Coordination normal.  Skin: Skin is warm and dry. No rash noted. Not diaphoretic. No erythema. No pallor.  Psychiatric: Mood, memory and judgment normal.  Vitals reviewed.  LABORATORY DATA: Lab Results  Component Value Date   WBC 4.5 04/02/2019   HGB 10.9 (L) 04/02/2019   HCT 33.6 (L) 04/02/2019   MCV 81.0 04/02/2019   PLT 179 04/02/2019      Chemistry      Component Value Date/Time    NA 131 (L) 04/02/2019 0847   K 4.3 04/02/2019 0847   CL 101 04/02/2019 0847   CO2 21 (L) 04/02/2019 0847   BUN 14 04/02/2019 0847   CREATININE 0.61 04/02/2019 0847      Component Value Date/Time   CALCIUM 9.3 04/02/2019 0847   ALKPHOS 84 04/02/2019 0847   AST 26 04/02/2019 0847   ALT 23 04/02/2019 0847   BILITOT 0.8 04/02/2019 0847       RADIOGRAPHIC STUDIES:  Dg Chest 2 View  Result Date: 03/26/2019 CLINICAL DATA:  63 year old with right lung cancer and status post right lower lobe wedge resection. EXAM: CHEST - 2 VIEW COMPARISON:  01/22/2019 FINDINGS: Stable volume loss in right hemithorax compatible with prior surgery. Stable mediastinal shift towards the right. Left lung is clear. Negative for a pneumothorax. No significant pleural fluid. Heart size is normal and stable. IMPRESSION: 1. No acute chest findings. 2. Volume loss in the right hemithorax compatible with prior surgery. Electronically Signed   By: Markus Daft M.D.   On: 03/26/2019 12:39     ASSESSMENT/PLAN:  This is a very pleasant 63 year old Caucasian female recently diagnosed with stage IIIA (T2a, N2, M0)non-small cell lung cancer, adenocarcinoma. She presented with a right lower lobe lung mass and mediastinal lymphadenopathy. She is status post right lower lobectomy with lymph node dissection on Jan 07, 2019 under the care of Dr. Roxan Hockey. She has no actionable mutations and her PDL1 expression is negative.  The patient is currently undergoing adjuvant chemotherapy with cisplatin 75 mg/m and Alimta 500 mg/m IV every 3 weeks.  He is status post 2 cycles.  She has been tolerating treatment without any adverse side effects. He chemotherapy will be followed by adjuvant radiotherapy to the mediastinum.  The patient was seen with Dr. Julien Nordmann today.  Labs were reviewed with patient.  We recommend that she proceed with cycle #3 today scheduled.  We will see her back for follow-up visit in 3 weeks for evaluation before  starting her final cycle of adjuvant chemotherapy, cycle #4.  I will arrange for the patient to receive an additional 2 g of magnesium sulfate via her IV fluid today.  The patient will also continue taking magnesium oxide p.o. daily.  The patient was advised to call immediately if she has any concerning symptoms in the interval. The patient voices understanding of current disease status and treatment options and is in agreement with the current care plan. All questions were answered. The patient knows to call the clinic with any problems, questions or concerns. We can certainly see the patient much sooner if necessary  No orders of the defined types were placed in this encounter.    Sandy L Heilingoetter, PA-C  04/02/19  ADDENDUM: Hematology/Oncology Attending: I had a face-to-face encounter with the patient today.  I recommended her care plan.  This is a very pleasant 63 years old white female with a stage IIIa non-small cell lung cancer status post right lower lobectomy with lymph node dissection and currently undergoing adjuvant treatment with cisplatin and Alimta status post 2 cycles.  The patient has been tolerating this treatment well with no concerning adverse effects except for hypomagnesemia. I recommended for the patient to continue on her current treatment and she will proceed with cycle #3 today as planned. We will see her back for follow-up visit in 3 weeks for evaluation before starting the final cycle of her treatment. For the hypomagnesemia, will increase her magnesium sulfate in the IV fluid during treatment today.  She will also continue with oral magnesium oxide. The patient was advised to call immediately if she has any concerning symptoms in the interval.  Disclaimer: This note was dictated with voice recognition software. Similar sounding words can inadvertently be transcribed and may be missed upon review. Eilleen Kempf, MD 04/02/19

## 2019-04-09 ENCOUNTER — Other Ambulatory Visit: Payer: Self-pay

## 2019-04-09 ENCOUNTER — Other Ambulatory Visit: Payer: Self-pay | Admitting: Physician Assistant

## 2019-04-09 ENCOUNTER — Inpatient Hospital Stay: Payer: Medicare Other

## 2019-04-09 ENCOUNTER — Telehealth: Payer: Self-pay | Admitting: *Deleted

## 2019-04-09 DIAGNOSIS — Z5111 Encounter for antineoplastic chemotherapy: Secondary | ICD-10-CM | POA: Diagnosis not present

## 2019-04-09 DIAGNOSIS — C3491 Malignant neoplasm of unspecified part of right bronchus or lung: Secondary | ICD-10-CM

## 2019-04-09 LAB — CBC WITH DIFFERENTIAL (CANCER CENTER ONLY)
Abs Immature Granulocytes: 0.01 10*3/uL (ref 0.00–0.07)
Basophils Absolute: 0 10*3/uL (ref 0.0–0.1)
Basophils Relative: 1 %
Eosinophils Absolute: 0 10*3/uL (ref 0.0–0.5)
Eosinophils Relative: 1 %
HCT: 34.4 % — ABNORMAL LOW (ref 36.0–46.0)
Hemoglobin: 11.1 g/dL — ABNORMAL LOW (ref 12.0–15.0)
Immature Granulocytes: 0 %
Lymphocytes Relative: 54 %
Lymphs Abs: 1.5 10*3/uL (ref 0.7–4.0)
MCH: 26.4 pg (ref 26.0–34.0)
MCHC: 32.3 g/dL (ref 30.0–36.0)
MCV: 81.9 fL (ref 80.0–100.0)
Monocytes Absolute: 0.4 10*3/uL (ref 0.1–1.0)
Monocytes Relative: 14 %
Neutro Abs: 0.8 10*3/uL — ABNORMAL LOW (ref 1.7–7.7)
Neutrophils Relative %: 30 %
Platelet Count: 183 10*3/uL (ref 150–400)
RBC: 4.2 MIL/uL (ref 3.87–5.11)
RDW: 21.5 % — ABNORMAL HIGH (ref 11.5–15.5)
WBC Count: 2.8 10*3/uL — ABNORMAL LOW (ref 4.0–10.5)
nRBC: 0 % (ref 0.0–0.2)

## 2019-04-09 LAB — CMP (CANCER CENTER ONLY)
ALT: 29 U/L (ref 0–44)
AST: 22 U/L (ref 15–41)
Albumin: 3.3 g/dL — ABNORMAL LOW (ref 3.5–5.0)
Alkaline Phosphatase: 71 U/L (ref 38–126)
Anion gap: 9 (ref 5–15)
BUN: 9 mg/dL (ref 8–23)
CO2: 24 mmol/L (ref 22–32)
Calcium: 8.5 mg/dL — ABNORMAL LOW (ref 8.9–10.3)
Chloride: 102 mmol/L (ref 98–111)
Creatinine: 0.71 mg/dL (ref 0.44–1.00)
GFR, Est AFR Am: >60 mL/min (ref >60)
GFR, Estimated: >60 mL/min (ref >60)
Glucose, Bld: 64 mg/dL — ABNORMAL LOW (ref 70–99)
Potassium: 4.4 mmol/L (ref 3.5–5.1)
Sodium: 135 mmol/L (ref 135–145)
Total Bilirubin: 0.5 mg/dL (ref 0.3–1.2)
Total Protein: 6.4 g/dL — ABNORMAL LOW (ref 6.5–8.1)

## 2019-04-09 LAB — MAGNESIUM: Magnesium: 0.9 mg/dL — CL (ref 1.7–2.4)

## 2019-04-09 NOTE — Telephone Encounter (Signed)
Pt called to say she received my message.

## 2019-04-09 NOTE — Telephone Encounter (Signed)
Received call report from [facility].  "Today's Mg+ = 0.9."  Reached collaborative with results.

## 2019-04-09 NOTE — Telephone Encounter (Signed)
Per Julien Nordmann give pt 6 gms of magnesium iv tomorrow-Cassie will order mag and I left message for pt to come in tomorrow for IV mag and a scheduler will call her.  Left same VM on pt cell phone and requested a call back when she received the message.

## 2019-04-10 ENCOUNTER — Other Ambulatory Visit: Payer: Self-pay

## 2019-04-10 ENCOUNTER — Inpatient Hospital Stay: Payer: Medicare Other

## 2019-04-10 DIAGNOSIS — Z5111 Encounter for antineoplastic chemotherapy: Secondary | ICD-10-CM | POA: Diagnosis not present

## 2019-04-10 MED ORDER — SODIUM CHLORIDE 0.9 % IV SOLN
INTRAVENOUS | Status: DC
  Administered 2019-04-10: 12:00:00 via INTRAVENOUS
  Filled 2019-04-10: qty 250

## 2019-04-10 MED ORDER — SODIUM CHLORIDE 0.9 % IV SOLN
Freq: Once | INTRAVENOUS | Status: AC
  Administered 2019-04-10: 12:00:00 via INTRAVENOUS
  Filled 2019-04-10: qty 1000

## 2019-04-16 ENCOUNTER — Telehealth: Payer: Self-pay | Admitting: *Deleted

## 2019-04-16 ENCOUNTER — Inpatient Hospital Stay: Payer: Medicare Other

## 2019-04-16 ENCOUNTER — Other Ambulatory Visit: Payer: Self-pay

## 2019-04-16 DIAGNOSIS — Z5111 Encounter for antineoplastic chemotherapy: Secondary | ICD-10-CM | POA: Diagnosis not present

## 2019-04-16 DIAGNOSIS — C3491 Malignant neoplasm of unspecified part of right bronchus or lung: Secondary | ICD-10-CM

## 2019-04-16 LAB — CBC WITH DIFFERENTIAL (CANCER CENTER ONLY)
Abs Immature Granulocytes: 0 10*3/uL (ref 0.00–0.07)
Basophils Absolute: 0 10*3/uL (ref 0.0–0.1)
Basophils Relative: 0 %
Eosinophils Absolute: 0.1 10*3/uL (ref 0.0–0.5)
Eosinophils Relative: 2 %
HCT: 33.6 % — ABNORMAL LOW (ref 36.0–46.0)
Hemoglobin: 10.8 g/dL — ABNORMAL LOW (ref 12.0–15.0)
Immature Granulocytes: 0 %
Lymphocytes Relative: 46 %
Lymphs Abs: 1.3 10*3/uL (ref 0.7–4.0)
MCH: 27.1 pg (ref 26.0–34.0)
MCHC: 32.1 g/dL (ref 30.0–36.0)
MCV: 84.4 fL (ref 80.0–100.0)
Monocytes Absolute: 0.4 10*3/uL (ref 0.1–1.0)
Monocytes Relative: 13 %
Neutro Abs: 1.1 10*3/uL — ABNORMAL LOW (ref 1.7–7.7)
Neutrophils Relative %: 39 %
Platelet Count: 102 10*3/uL — ABNORMAL LOW (ref 150–400)
RBC: 3.98 MIL/uL (ref 3.87–5.11)
RDW: 22.1 % — ABNORMAL HIGH (ref 11.5–15.5)
WBC Count: 2.8 10*3/uL — ABNORMAL LOW (ref 4.0–10.5)
nRBC: 0 % (ref 0.0–0.2)

## 2019-04-16 LAB — CMP (CANCER CENTER ONLY)
ALT: 14 U/L (ref 0–44)
AST: 20 U/L (ref 15–41)
Albumin: 3.4 g/dL — ABNORMAL LOW (ref 3.5–5.0)
Alkaline Phosphatase: 88 U/L (ref 38–126)
Anion gap: 6 (ref 5–15)
BUN: 6 mg/dL — ABNORMAL LOW (ref 8–23)
CO2: 25 mmol/L (ref 22–32)
Calcium: 9.5 mg/dL (ref 8.9–10.3)
Chloride: 104 mmol/L (ref 98–111)
Creatinine: 0.79 mg/dL (ref 0.44–1.00)
GFR, Est AFR Am: >60 mL/min (ref >60)
GFR, Estimated: >60 mL/min (ref >60)
Glucose, Bld: 141 mg/dL — ABNORMAL HIGH (ref 70–99)
Potassium: 4.6 mmol/L (ref 3.5–5.1)
Sodium: 135 mmol/L (ref 135–145)
Total Bilirubin: 0.5 mg/dL (ref 0.3–1.2)
Total Protein: 6.6 g/dL (ref 6.5–8.1)

## 2019-04-16 LAB — MAGNESIUM: Magnesium: 1.4 mg/dL — CL (ref 1.7–2.4)

## 2019-04-16 NOTE — Telephone Encounter (Signed)
Received call report from [facility].  "Today's Mg = 1.4."  Provider given note with results.

## 2019-04-17 ENCOUNTER — Telehealth: Payer: Self-pay | Admitting: *Deleted

## 2019-04-17 NOTE — Telephone Encounter (Signed)
-----   Message from [employer], PA-C sent at 04/17/2019  9:42 AM EDT ----- Would you be able to call this patient and make sure she is taking her oral magnesium?  ----- Message ----- From: Interface, Lab In [facility] Sent: 04/16/2019   2:55 PM EDT To: Tobe Sos Heilingoetter, PA-C

## 2019-04-17 NOTE — Telephone Encounter (Signed)
States the tablets give her terrible heartburn. Taking 2 tablets per day instead of 4 per day. States its the only way she can keep from throwing up. The heartburn wakes her up at night if she takes 4 tablets.

## 2019-04-23 ENCOUNTER — Other Ambulatory Visit: Payer: Self-pay

## 2019-04-23 ENCOUNTER — Inpatient Hospital Stay: Payer: Medicare Other | Attending: Internal Medicine | Admitting: Internal Medicine

## 2019-04-23 ENCOUNTER — Inpatient Hospital Stay: Payer: Medicare Other

## 2019-04-23 ENCOUNTER — Other Ambulatory Visit: Payer: Self-pay | Admitting: Internal Medicine

## 2019-04-23 ENCOUNTER — Encounter: Payer: Self-pay | Admitting: Internal Medicine

## 2019-04-23 VITALS — BP 153/85 | HR 78 | Temp 98.3°F | Resp 20 | Ht 64.0 in | Wt 172.2 lb

## 2019-04-23 DIAGNOSIS — E119 Type 2 diabetes mellitus without complications: Secondary | ICD-10-CM | POA: Insufficient documentation

## 2019-04-23 DIAGNOSIS — M1990 Unspecified osteoarthritis, unspecified site: Secondary | ICD-10-CM | POA: Diagnosis not present

## 2019-04-23 DIAGNOSIS — C3490 Malignant neoplasm of unspecified part of unspecified bronchus or lung: Secondary | ICD-10-CM

## 2019-04-23 DIAGNOSIS — R05 Cough: Secondary | ICD-10-CM | POA: Diagnosis not present

## 2019-04-23 DIAGNOSIS — Z7984 Long term (current) use of oral hypoglycemic drugs: Secondary | ICD-10-CM | POA: Insufficient documentation

## 2019-04-23 DIAGNOSIS — R9389 Abnormal findings on diagnostic imaging of other specified body structures: Secondary | ICD-10-CM | POA: Insufficient documentation

## 2019-04-23 DIAGNOSIS — I10 Essential (primary) hypertension: Secondary | ICD-10-CM | POA: Insufficient documentation

## 2019-04-23 DIAGNOSIS — Z79899 Other long term (current) drug therapy: Secondary | ICD-10-CM | POA: Diagnosis not present

## 2019-04-23 DIAGNOSIS — R590 Localized enlarged lymph nodes: Secondary | ICD-10-CM | POA: Diagnosis not present

## 2019-04-23 DIAGNOSIS — Z885 Allergy status to narcotic agent status: Secondary | ICD-10-CM | POA: Insufficient documentation

## 2019-04-23 DIAGNOSIS — Z886 Allergy status to analgesic agent status: Secondary | ICD-10-CM | POA: Insufficient documentation

## 2019-04-23 DIAGNOSIS — C3491 Malignant neoplasm of unspecified part of right bronchus or lung: Secondary | ICD-10-CM

## 2019-04-23 DIAGNOSIS — R5383 Other fatigue: Secondary | ICD-10-CM | POA: Insufficient documentation

## 2019-04-23 DIAGNOSIS — C3431 Malignant neoplasm of lower lobe, right bronchus or lung: Secondary | ICD-10-CM | POA: Insufficient documentation

## 2019-04-23 DIAGNOSIS — Z5111 Encounter for antineoplastic chemotherapy: Secondary | ICD-10-CM | POA: Diagnosis not present

## 2019-04-23 LAB — CMP (CANCER CENTER ONLY)
ALT: 14 U/L (ref 0–44)
AST: 26 U/L (ref 15–41)
Albumin: 3.9 g/dL (ref 3.5–5.0)
Alkaline Phosphatase: 90 U/L (ref 38–126)
Anion gap: 13 (ref 5–15)
BUN: 10 mg/dL (ref 8–23)
CO2: 17 mmol/L — ABNORMAL LOW (ref 22–32)
Calcium: 9.5 mg/dL (ref 8.9–10.3)
Chloride: 102 mmol/L (ref 98–111)
Creatinine: 0.8 mg/dL (ref 0.44–1.00)
GFR, Est AFR Am: >60 mL/min (ref >60)
GFR, Estimated: >60 mL/min (ref >60)
Glucose, Bld: 196 mg/dL — ABNORMAL HIGH (ref 70–99)
Potassium: 4.8 mmol/L (ref 3.5–5.1)
Sodium: 132 mmol/L — ABNORMAL LOW (ref 135–145)
Total Bilirubin: 0.7 mg/dL (ref 0.3–1.2)
Total Protein: 7.5 g/dL (ref 6.5–8.1)

## 2019-04-23 LAB — CBC WITH DIFFERENTIAL (CANCER CENTER ONLY)
Abs Immature Granulocytes: 0.03 10*3/uL (ref 0.00–0.07)
Basophils Absolute: 0 10*3/uL (ref 0.0–0.1)
Basophils Relative: 0 %
Eosinophils Absolute: 0 10*3/uL (ref 0.0–0.5)
Eosinophils Relative: 0 %
HCT: 36.1 % (ref 36.0–46.0)
Hemoglobin: 12 g/dL (ref 12.0–15.0)
Immature Granulocytes: 1 %
Lymphocytes Relative: 23 %
Lymphs Abs: 1.1 10*3/uL (ref 0.7–4.0)
MCH: 27.5 pg (ref 26.0–34.0)
MCHC: 33.2 g/dL (ref 30.0–36.0)
MCV: 82.8 fL (ref 80.0–100.0)
Monocytes Absolute: 0.3 10*3/uL (ref 0.1–1.0)
Monocytes Relative: 6 %
Neutro Abs: 3.3 10*3/uL (ref 1.7–7.7)
Neutrophils Relative %: 70 %
Platelet Count: 297 10*3/uL (ref 150–400)
RBC: 4.36 MIL/uL (ref 3.87–5.11)
RDW: 22 % — ABNORMAL HIGH (ref 11.5–15.5)
WBC Count: 4.7 10*3/uL (ref 4.0–10.5)
nRBC: 0 % (ref 0.0–0.2)

## 2019-04-23 LAB — MAGNESIUM: Magnesium: 1.4 mg/dL — CL (ref 1.7–2.4)

## 2019-04-23 MED ORDER — POTASSIUM CHLORIDE 2 MEQ/ML IV SOLN
Freq: Once | INTRAVENOUS | Status: AC
  Administered 2019-04-23: 10:00:00 via INTRAVENOUS
  Filled 2019-04-23: qty 10

## 2019-04-23 MED ORDER — SODIUM CHLORIDE 0.9 % IV SOLN
75.0000 mg/m2 | Freq: Once | INTRAVENOUS | Status: AC
  Administered 2019-04-23: 141 mg via INTRAVENOUS
  Filled 2019-04-23: qty 141

## 2019-04-23 MED ORDER — PALONOSETRON HCL INJECTION 0.25 MG/5ML
INTRAVENOUS | Status: AC
  Filled 2019-04-23: qty 5

## 2019-04-23 MED ORDER — SODIUM CHLORIDE 0.9 % IV SOLN
Freq: Once | INTRAVENOUS | Status: AC
  Administered 2019-04-23: 12:00:00 via INTRAVENOUS
  Filled 2019-04-23: qty 5

## 2019-04-23 MED ORDER — SODIUM CHLORIDE 0.9 % IV SOLN
Freq: Once | INTRAVENOUS | Status: AC
  Administered 2019-04-23: 10:00:00 via INTRAVENOUS
  Filled 2019-04-23: qty 250

## 2019-04-23 MED ORDER — PALONOSETRON HCL INJECTION 0.25 MG/5ML
0.2500 mg | Freq: Once | INTRAVENOUS | Status: AC
  Administered 2019-04-23: 12:00:00 0.25 mg via INTRAVENOUS

## 2019-04-23 MED ORDER — SODIUM CHLORIDE 0.9 % IV SOLN
530.0000 mg/m2 | Freq: Once | INTRAVENOUS | Status: AC
  Administered 2019-04-23: 13:00:00 1000 mg via INTRAVENOUS
  Filled 2019-04-23: qty 40

## 2019-04-23 NOTE — Progress Notes (Signed)
Pt Mag 1.4, per Dr. Julien Nordmann pt to receive an additional 2 grams of mag in her cisplatin hydration fluids.  Pharmacy notified.

## 2019-04-23 NOTE — Progress Notes (Signed)
[facility] Telephone:([phone]) [phone]   [phone]  OFFICE PROGRESS NOTE  [name], MD [place] [address]  DIAGNOSIS: stage IIIA (T2a, N2, M0)non-small cell lung cancer, adenocarcinoma presented with right lower lobe lung mass and mediastinal lymphadenopathy. Diagnosed in May 2020.   Biomarker Findings Microsatellite status - MS-Stable Tumor Mutational Burden - 9 Muts/Mb Genomic Findings For a complete list of the genes assayed, please refer to the Appendix. NF1 K33* TP53 R158P 8 Disease relevant genes with no reportable alterations: ALK, BRAF, EGFR, ERBB2, KRAS, MET, RET, ROS1  PDL 1 expression negative  PRIOR THERAPY:  Status post right lower lobectomy with lymph node dissection on Jan 07, 2019 under the care of Dr. Roxan Hockey.  CURRENT THERAPY: Adjuvant platinum based regimen with cisplatin 75 mg/M2 and Alimta 500 mg/M2 every 3 weeks. First dose on June 30th, 2020.  Status post 3 cycles.  INTERVAL HISTORY: Sandy [name] 63 y.o. female returns to the clinic today for follow-up visit.  The patient is feeling fine today with no concerning complaints except for fatigue and mild cough.  She denied having any current chest pain, shortness of breath or hemoptysis.  She denied having any fever or chills.  She has no nausea, vomiting, diarrhea or constipation.  She has no headache or visual changes.  The patient has been tolerating her systemic chemotherapy fairly well.  She is here today for evaluation before starting cycle #4 of her treatment.  MEDICAL HISTORY: Past Medical History:  Diagnosis Date  . Adenocarcinoma of lung, stage 3, right ([place]) 2020  . Arthritis   . Cirrhosis ([place])   . COPD (chronic obstructive pulmonary disease) ([place])   . Dyslipidemia   . Dyspnea   . Esophageal varices ([place])   . History of blood transfusion   . History of hiatal hernia   . Hypertension   . Pneumonia   . Type 2 diabetes  mellitus ([facility])    Type II    ALLERGIES:  is allergic to codeine and nsaids.  MEDICATIONS:  Current Outpatient Medications  Medication Sig Dispense Refill  . acetaminophen (TYLENOL) 500 MG tablet Take 1,000 mg by mouth every 6 (six) hours as needed for moderate pain or headache.    Marland Kitchen atorvastatin (LIPITOR) 10 MG tablet Take 10 mg by mouth at bedtime.     Marland Kitchen dexamethasone (DECADRON) 4 MG tablet Take 1 (4 mg) tablet twice a day the day before, the day of, and the day after chemotherapy 24 tablet 0  . diphenhydrAMINE (BENADRYL) 25 MG tablet Take 50 mg by mouth at bedtime as needed.    . folic acid (FOLVITE) 1 MG tablet Take 1 tablet (1 mg total) by mouth daily. 30 tablet 2  . losartan (COZAAR) 50 MG tablet Take 1 tablet (50 mg total) by mouth daily. 30 tablet 3  . magnesium oxide (MAG-OX) 400 (241.3 Mg) MG tablet Take 2 tablets (800 mg total) by mouth 2 (two) times daily. 60 tablet 2  . metFORMIN (GLUCOPHAGE-XR) 500 MG 24 hr tablet Take 500 mg by mouth at bedtime.    . nadolol (CORGARD) 40 MG tablet Take 40 mg by mouth at bedtime.    Marland Kitchen omeprazole (PRILOSEC) 20 MG capsule Take 20 mg by mouth daily.     . prochlorperazine (COMPAZINE) 10 MG tablet Take 1 tablet (10 mg total) by mouth every 6 (six) hours as needed for nausea or vomiting. (Patient not taking: Reported on 03/26/2019)  30 tablet 0  . Tetrahydrozoline HCl (VISINE OP) Place 1 drop into both eyes daily as needed (irritation).     No current facility-administered medications for this visit.     SURGICAL HISTORY:  Past Surgical History:  Procedure Laterality Date  . ABDOMINAL HYSTERECTOMY    . ANTERIOR CRUCIATE LIGAMENT REPAIR Left    x 2  . ESOPHAGOGASTRODUODENOSCOPY    . LOBECTOMY Right 01/07/2019   Procedure: RIGHT LOWER LOBE LOBECTOMY AND INTERCOSTAL NERVE BLOCK;  Surgeon: Melrose Nakayama, MD;  Location: [facility];  Service: Thoracic;  Laterality: Right;  Marland Kitchen VIDEO ASSISTED THORACOSCOPY (VATS)/WEDGE RESECTION Right 01/07/2019    Procedure: VIDEO ASSISTED THORACOSCOPY (VATS) RIGHT LOWER LOBE WEDGE RESECTION;  Surgeon: Melrose Nakayama, MD;  [facility];  Service: Thoracic;  Laterality: Right;    REVIEW OF SYSTEMS:  A comprehensive review of systems was negative except for: Respiratory: positive for cough   PHYSICAL EXAMINATION: General appearance: alert, cooperative and no distress Head: Normocephalic, without obvious abnormality, atraumatic Neck: no adenopathy, no JVD, supple, symmetrical, trachea midline and thyroid not enlarged, symmetric, no tenderness/mass/nodules Lymph nodes: Cervical, supraclavicular, and axillary nodes normal. Resp: clear to auscultation bilaterally Back: symmetric, no curvature. ROM normal. No CVA tenderness. Cardio: regular rate and rhythm, S1, S2 normal, no murmur, click, rub or gallop GI: soft, non-tender; bowel sounds normal; no masses,  no organomegaly Extremities: extremities normal, atraumatic, no cyanosis or edema  ECOG PERFORMANCE STATUS: 1 - Symptomatic but completely ambulatory  Blood pressure (!) 153/85, pulse 78, temperature 98.3 F (36.8 C), temperature source Oral, resp. rate 20, height 5' 4"  (1.626 m), weight 172 lb 3.2 oz (78.1 kg), SpO2 98 %.  LABORATORY DATA: Lab Results  Component Value Date   WBC 4.7 04/23/2019   HGB 12.0 04/23/2019   HCT 36.1 04/23/2019   MCV 82.8 04/23/2019   PLT 297 04/23/2019      Chemistry      Component Value Date/Time   NA 135 04/16/2019 1443   K 4.6 04/16/2019 1443   CL 104 04/16/2019 1443   CO2 25 04/16/2019 1443   BUN 6 (L) 04/16/2019 1443   CREATININE 0.79 04/16/2019 1443      Component Value Date/Time   CALCIUM 9.5 04/16/2019 1443   ALKPHOS 88 04/16/2019 1443   AST 20 04/16/2019 1443   ALT 14 04/16/2019 1443   BILITOT 0.5 04/16/2019 1443       RADIOGRAPHIC STUDIES: Dg Chest 2 View  Result Date: 03/26/2019 CLINICAL DATA:  63 year old with right lung cancer and status post right lower lobe wedge resection.  EXAM: CHEST - 2 VIEW COMPARISON:  01/22/2019 FINDINGS: Stable volume loss in right hemithorax compatible with prior surgery. Stable mediastinal shift towards the right. Left lung is clear. Negative for a pneumothorax. No significant pleural fluid. Heart size is normal and stable. IMPRESSION: 1. No acute chest findings. 2. Volume loss in the right hemithorax compatible with prior surgery. Electronically Signed   By: Markus Daft M.D.   On: 03/26/2019 12:39    ASSESSMENT AND PLAN: This is a very pleasant 63 years old white female with stage IIIA non-small cell lung cancer, adenocarcinoma with no actionable mutations status post right lower lobectomy with lymph node dissection under the care of Dr. Roxan Hockey on Jan 07, 2019. The patient is currently undergoing adjuvant systemic chemotherapy with cisplatin and Alimta status post 3 cycles. The patient has been tolerating her treatment well with no concerning adverse effects. I recommended for him to proceed  with cycle #4 today as planned. For hypertension she was advised to take her blood pressure medication and to monitor it closely at home.  We will check it 1 more time before her treatment today and consider her for clonidine if needed. For the hypomagnesemia she will continue on magnesium oxide to 400 mg p.o. 4 times daily.  We will monitor her electrolytes closely during her treatment. The patient was advised to call immediately if she has any concerning symptoms in the interval. The patient voices understanding of current disease status and treatment options and is in agreement with the current care plan. All questions were answered. The patient knows to call the clinic with any problems, questions or concerns. We can certainly see the patient much sooner if necessary.  I spent 10 minutes counseling the patient face to face. The total time spent in the appointment was 15 minutes.  Disclaimer: This note was dictated with voice recognition software.  Similar sounding words can inadvertently be transcribed and may not be corrected upon review.

## 2019-04-23 NOTE — Patient Instructions (Signed)
[facility] Discharge Instructions for Patients Receiving Chemotherapy  Today you received the following chemotherapy agents Alimta and Cisplatin  To help prevent nausea and vomiting after your treatment, we encourage you to take your nausea medication as directed   If you develop nausea and vomiting that is not controlled by your nausea medication, call the clinic.   BELOW ARE SYMPTOMS THAT SHOULD BE REPORTED IMMEDIATELY:  *FEVER GREATER THAN 100.5 F  *CHILLS WITH OR WITHOUT FEVER  NAUSEA AND VOMITING THAT IS NOT CONTROLLED WITH YOUR NAUSEA MEDICATION  *UNUSUAL SHORTNESS OF BREATH  *UNUSUAL BRUISING OR BLEEDING  TENDERNESS IN MOUTH AND THROAT WITH OR WITHOUT PRESENCE OF ULCERS  *URINARY PROBLEMS  *BOWEL PROBLEMS  UNUSUAL RASH Items with * indicate a potential emergency and should be followed up as soon as possible.  Feel free to call the clinic should you have any questions or concerns. The clinic phone number is [phone].  Please show the CHEMO ALERT CARD at check-in to the Emergency Department and triage nurse.   

## 2019-04-25 ENCOUNTER — Telehealth: Payer: Self-pay | Admitting: Internal Medicine

## 2019-04-25 ENCOUNTER — Ambulatory Visit
Admission: RE | Admit: 2019-04-25 | Discharge: 2019-04-25 | Disposition: A | Discharge status: Home / Self Care | Payer: Medicare Other | Source: Ambulatory Visit | Attending: Radiation Oncology | Admitting: Radiation Oncology

## 2019-04-25 DIAGNOSIS — C3491 Malignant neoplasm of unspecified part of right bronchus or lung: Secondary | ICD-10-CM

## 2019-04-25 NOTE — Progress Notes (Signed)
Radiation Oncology         [phone] ________________________________  Outpatient Consultation - Conducted via telephone due to current COVID-19 concerns for limiting patient exposure  I spoke with the patient to conduct this consult visit via telephone to spare the patient unnecessary potential exposure in the healthcare setting during the current COVID-19 pandemic. The patient was notified in advance and was offered a [place] meeting to allow for face to face communication but unfortunately reported that they did not have the appropriate resources/technology to support such a visit and instead preferred to proceed with a telephone visit.     ________________________________  Name: Sandy [name]        MRN: [id number]  Date of Service: 04/25/2019 DOB: 20-Feb-1956  ID:[id number], Curt Jews, MD  Curt Bears, MD     REFERRING PHYSICIAN: Curt Bears, MD   DIAGNOSIS: The encounter diagnosis was Primary lung adenocarcinoma, right [facility]).   HISTORY OF PRESENT ILLNESS: Sandy [name] is a 63 y.o. female with a non-small cell lung cancer arising in the right lower lobe.  The patient had a lung cancer screening CT back in December 2019.  She was found to have a right lower lobe nodule and repeat CT scan 3 months later was recommended.On 10/23/2018 this was performed revealing a 2.9 cm lesion in the superior segment of the posterior aspect of the right lower lobe.  No pathologically enlarged mediastinal or hilar nodes were identified.  She underwent a PET scan on 12/07/2018 that measured the lesion is be 3.5 x 2.4 cm with an SUV max of 7.6.  No thoracic nodal metabolism was identified, and she was felt to be a good surgical candidate. Of note a cystic change in the left kidney is being followed by Dr. Gloriann Loan in urology.  She was taken to the operating room on 01/07/2019 where she underwent a video-assisted thoracoscopy with right lower lobe wedge resection.  Final pathology from this procedure  revealed a 4 cm span of invasive adenocarcinoma that was well differentiated, limited to the lung, and final resection margins were negative.  She did have lymph nodes sampled, one was positive at level 7, another was positive at level 12, and a total of 8 lymph nodes were examined.  Because of the findings she was counseled on adjuvant chemotherapy and radiation. She completed 4 cycles of Cisplatin and Alimta between 02/19/2019 and 04/23/2019. She is contacted by phone to discuss treatment now with radiation. Of note, she has a history of cirrhosis and esophageal varices and continues to be followed by Roosevelt Locks, NP and Dr. Gertie Fey. She's had several episodes of hemorrhage that have been managed over the years, but has not had any episodes since 2015 or 2016. She recalls having banding procedures in the past, and was last seen with EGD on 10/18/2017    PREVIOUS RADIATION THERAPY: No   PAST MEDICAL HISTORY:  Past Medical History:  Diagnosis Date   Adenocarcinoma of lung, stage 3, right ([place]) 2020   Arthritis    Cirrhosis (HCC)    COPD (chronic obstructive pulmonary disease) (HCC)    Dyslipidemia    Dyspnea    Esophageal varices (HCC)    History of blood transfusion    History of hiatal hernia    Hypertension    Pneumonia    Type 2 diabetes mellitus ([place])    Type II       PAST SURGICAL HISTORY: Past Surgical History:  Procedure Laterality Date   ABDOMINAL HYSTERECTOMY  ANTERIOR CRUCIATE LIGAMENT REPAIR Left    x 2   ESOPHAGOGASTRODUODENOSCOPY     LOBECTOMY Right 01/07/2019   Procedure: RIGHT LOWER LOBE LOBECTOMY AND INTERCOSTAL NERVE BLOCK;  Surgeon: Melrose Nakayama, MD;  Location: [facility] OR;  Service: Thoracic;  Laterality: Right;   VIDEO ASSISTED THORACOSCOPY (VATS)/WEDGE RESECTION Right 01/07/2019   Procedure: VIDEO ASSISTED THORACOSCOPY (VATS) RIGHT LOWER LOBE WEDGE RESECTION;  Surgeon: Melrose Nakayama, MD;  Location: [facility] OR;  Service: Thoracic;   Laterality: Right;     FAMILY HISTORY:  Family History  Problem Relation Age of Onset   Leukemia Mother    Lung disease Neg Hx      SOCIAL HISTORY:  reports that she quit smoking about 4 years ago. She has a 37.00 pack-year smoking history. She has never used smokeless tobacco. She reports current alcohol use of about 1.0 standard drinks of alcohol per week. She reports current drug use. Drug: Marijuana.  The patient is single.  She lives in [place].   ALLERGIES: Codeine and Nsaids   MEDICATIONS:  Current Outpatient Medications  Medication Sig Dispense Refill   acetaminophen (TYLENOL) 500 MG tablet Take 1,000 mg by mouth every 6 (six) hours as needed for moderate pain or headache.     atorvastatin (LIPITOR) 10 MG tablet Take 10 mg by mouth at bedtime.      dexamethasone (DECADRON) 4 MG tablet Take 1 (4 mg) tablet twice a day the day before, the day of, and the day after chemotherapy 24 tablet 0   diphenhydrAMINE (BENADRYL) 25 MG tablet Take 50 mg by mouth at bedtime as needed.     folic acid (FOLVITE) 1 MG tablet Take 1 tablet (1 mg total) by mouth daily. 30 tablet 2   losartan (COZAAR) 50 MG tablet Take 1 tablet (50 mg total) by mouth daily. 30 tablet 3   magnesium oxide (MAG-OX) 400 (241.3 Mg) MG tablet Take 2 tablets (800 mg total) by mouth 2 (two) times daily. 60 tablet 2   metFORMIN (GLUCOPHAGE-XR) 500 MG 24 hr tablet Take 500 mg by mouth at bedtime.     nadolol (CORGARD) 40 MG tablet Take 40 mg by mouth at bedtime.     omeprazole (PRILOSEC) 20 MG capsule Take 20 mg by mouth daily.      prochlorperazine (COMPAZINE) 10 MG tablet Take 1 tablet (10 mg total) by mouth every 6 (six) hours as needed for nausea or vomiting. (Patient not taking: Reported on 03/26/2019) 30 tablet 0   Tetrahydrozoline HCl (VISINE OP) Place 1 drop into both eyes daily as needed (irritation).     No current facility-administered medications for this encounter.      REVIEW OF SYSTEMS: On  review of systems, the patient reports that she is doing well overall. She reports she was tired during chemo but no other complaints are noted regarding treatment. She denies any chest pain, cough, fevers, chills, night sweats, unintended weight changes. She denies abdominal pain, nausea or vomiting. She denies any new musculoskeletal or joint aches or pains. A complete review of systems is obtained and is otherwise negative.     PHYSICAL EXAM:  Unable to assess due to encounter type.   ECOG = 1  0 - Asymptomatic (Fully active, able to carry on all predisease activities without restriction)  1 - Symptomatic but completely ambulatory (Restricted in physically strenuous activity but ambulatory and able to carry out work of a light or sedentary nature. For example, light housework, office work)  2 -  Symptomatic, <50% in bed during the day (Ambulatory and capable of all self care but unable to carry out any work activities. Up and about more than 50% of waking hours)  3 - Symptomatic, >50% in bed, but not bedbound (Capable of only limited self-care, confined to bed or chair 50% or more of waking hours)  4 - Bedbound (Completely disabled. Cannot carry on any self-care. Totally confined to bed or chair)  5 - Death   Eustace Pen MM, Creech RH, Tormey DC, et al. [phone]). "Toxicity and response criteria of the [facility]". [place] Oncol. 5 (6): 649-55    LABORATORY DATA:  Lab Results  Component Value Date   WBC 4.7 04/23/2019   HGB 12.0 04/23/2019   HCT 36.1 04/23/2019   MCV 82.8 04/23/2019   PLT 297 04/23/2019   Lab Results  Component Value Date   NA 132 (L) 04/23/2019   K 4.8 04/23/2019   CL 102 04/23/2019   CO2 17 (L) 04/23/2019   Lab Results  Component Value Date   ALT 14 04/23/2019   AST 26 04/23/2019   ALKPHOS 90 04/23/2019   BILITOT 0.7 04/23/2019      RADIOGRAPHY: No results found.     IMPRESSION/PLAN: 1. Stage IIIA, pT2N2, NSCLC,  adenocarcinoma of the RLL. Dr. Lisbeth Renshaw reviews her recent course of treatment for her locally advanced lung cancer. He discusses the rationale for radiotherapy to treat  microscopic disease and to reduce the risk of local recurrence.  We discussed the risks, benefits, short, and long term effects of radiotherapy, and the patient is interested in proceeding. Dr. Lisbeth Renshaw discusses the delivery and logistics of radiotherapy and anticipates a course of  6 1/2 weeks of radiotherapy. She will be contacted to coordinate simulation in the near future and will sign written consent that day. 2. 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 The patient will continue with her plans to follow with Dr. Gloriann Loan. We will follow along expectantly.   3. History of cirrhosis and esophageal varices. The patient has done well. We reviewed that esophagitis is the most common side effect of radiotherapy so we will keep her GI providers aware of these plans as well.   Given current concerns for patient exposure during the COVID-19 pandemic, this encounter was conducted via telephone.  The patient has given verbal consent for this type of encounter. The time spent during this encounter was 61minutes and 50% of that time was spent in the coordination of her care. The attendants for this meeting included Dr. Lisbeth Renshaw, Shona Simpson, [facility] and [name]  During the encounter, Dr. Lisbeth Renshaw and Shona Simpson [facility] were located at [facility] Radiation Oncology Department.  Sandy [name]  was located at home.  The above documentation reflects my direct findings during this shared patient visit. Please see the separate note by Dr. Lisbeth Renshaw on this date for the remainder of the patient's plan of care.    Carola Rhine, PAC

## 2019-04-25 NOTE — Telephone Encounter (Signed)
Confirmed September appointments with patient, including ct scan for 9/29. When scheduling patient appointments I noticed ct scan ordered for 9/29 was scheduled 9/11. Per patient she had asked the young lady who called if scan could be done sooner as she was not aware provider requested 9/29. Patient ok with ct scan for 9/11 being cancelled and moved to 9/29 after lab at [facility].

## 2019-04-30 ENCOUNTER — Telehealth: Payer: Self-pay | Admitting: *Deleted

## 2019-04-30 ENCOUNTER — Inpatient Hospital Stay: Payer: Medicare Other

## 2019-04-30 ENCOUNTER — Other Ambulatory Visit: Payer: Self-pay

## 2019-04-30 DIAGNOSIS — C3491 Malignant neoplasm of unspecified part of right bronchus or lung: Secondary | ICD-10-CM

## 2019-04-30 DIAGNOSIS — Z5111 Encounter for antineoplastic chemotherapy: Secondary | ICD-10-CM | POA: Diagnosis not present

## 2019-04-30 LAB — CBC WITH DIFFERENTIAL (CANCER CENTER ONLY)
Abs Immature Granulocytes: 0.01 10*3/uL (ref 0.00–0.07)
Basophils Absolute: 0 10*3/uL (ref 0.0–0.1)
Basophils Relative: 0 %
Eosinophils Absolute: 0 10*3/uL (ref 0.0–0.5)
Eosinophils Relative: 1 %
HCT: 35.1 % — ABNORMAL LOW (ref 36.0–46.0)
Hemoglobin: 11.7 g/dL — ABNORMAL LOW (ref 12.0–15.0)
Immature Granulocytes: 0 %
Lymphocytes Relative: 57 %
Lymphs Abs: 1.3 10*3/uL (ref 0.7–4.0)
MCH: 27.9 pg (ref 26.0–34.0)
MCHC: 33.3 g/dL (ref 30.0–36.0)
MCV: 83.8 fL (ref 80.0–100.0)
Monocytes Absolute: 0.2 10*3/uL (ref 0.1–1.0)
Monocytes Relative: 8 %
Neutro Abs: 0.8 10*3/uL — ABNORMAL LOW (ref 1.7–7.7)
Neutrophils Relative %: 34 %
Platelet Count: 198 10*3/uL (ref 150–400)
RBC: 4.19 MIL/uL (ref 3.87–5.11)
RDW: 21 % — ABNORMAL HIGH (ref 11.5–15.5)
WBC Count: 2.3 10*3/uL — ABNORMAL LOW (ref 4.0–10.5)
nRBC: 0 % (ref 0.0–0.2)

## 2019-04-30 LAB — CMP (CANCER CENTER ONLY)
ALT: 31 U/L (ref 0–44)
AST: 34 U/L (ref 15–41)
Albumin: 3.6 g/dL (ref 3.5–5.0)
Alkaline Phosphatase: 80 U/L (ref 38–126)
Anion gap: 9 (ref 5–15)
BUN: 13 mg/dL (ref 8–23)
CO2: 24 mmol/L (ref 22–32)
Calcium: 8.8 mg/dL — ABNORMAL LOW (ref 8.9–10.3)
Chloride: 100 mmol/L (ref 98–111)
Creatinine: 0.97 mg/dL (ref 0.44–1.00)
GFR, Est AFR Am: >60 mL/min (ref >60)
GFR, Estimated: >60 mL/min (ref >60)
Glucose, Bld: 212 mg/dL — ABNORMAL HIGH (ref 70–99)
Potassium: 4.2 mmol/L (ref 3.5–5.1)
Sodium: 133 mmol/L — ABNORMAL LOW (ref 135–145)
Total Bilirubin: 1 mg/dL (ref 0.3–1.2)
Total Protein: 6.7 g/dL (ref 6.5–8.1)

## 2019-04-30 LAB — MAGNESIUM: Magnesium: 1 mg/dL — CL (ref 1.7–2.4)

## 2019-04-30 NOTE — Telephone Encounter (Signed)
Per Cassie Heilingoetter, PA called patient to advise of Magnesium level 1.0.  Confirmed patient is taking Magnesium Glycinate because the Magnesium Oxide caused severe heartburn.  Dr. Julien Nordmann asked that she take 400 mg four times a day but she reports she is only able to tolerate taking it twice a day.    Per Cassie instructed patient to continue dosing as she is and if she can tolerate more to take as instructed by Dr Julien Nordmann.  No further IV magnesium replacement to be ordered at this time since patient is no longer receiving Cisplatin.  She is scheduled for weekly labs and this will continue to be monitored.  Patient aware to call if she reports any concern for feeling poorly.

## 2019-05-03 ENCOUNTER — Ambulatory Visit (HOSPITAL_COMMUNITY): Payer: Medicare Other

## 2019-05-07 ENCOUNTER — Telehealth: Payer: Self-pay | Admitting: *Deleted

## 2019-05-07 ENCOUNTER — Other Ambulatory Visit: Payer: Self-pay

## 2019-05-07 ENCOUNTER — Inpatient Hospital Stay: Payer: Medicare Other

## 2019-05-07 ENCOUNTER — Encounter: Payer: Self-pay | Admitting: *Deleted

## 2019-05-07 DIAGNOSIS — Z5111 Encounter for antineoplastic chemotherapy: Secondary | ICD-10-CM | POA: Diagnosis not present

## 2019-05-07 DIAGNOSIS — C3491 Malignant neoplasm of unspecified part of right bronchus or lung: Secondary | ICD-10-CM

## 2019-05-07 LAB — CBC WITH DIFFERENTIAL (CANCER CENTER ONLY)
Abs Immature Granulocytes: 0 10*3/uL (ref 0.00–0.07)
Basophils Absolute: 0 10*3/uL (ref 0.0–0.1)
Basophils Relative: 1 %
Eosinophils Absolute: 0 10*3/uL (ref 0.0–0.5)
Eosinophils Relative: 2 %
HCT: 32 % — ABNORMAL LOW (ref 36.0–46.0)
Hemoglobin: 10.3 g/dL — ABNORMAL LOW (ref 12.0–15.0)
Immature Granulocytes: 0 %
Lymphocytes Relative: 41 %
Lymphs Abs: 1 10*3/uL (ref 0.7–4.0)
MCH: 28.3 pg (ref 26.0–34.0)
MCHC: 32.2 g/dL (ref 30.0–36.0)
MCV: 87.9 fL (ref 80.0–100.0)
Monocytes Absolute: 0.4 10*3/uL (ref 0.1–1.0)
Monocytes Relative: 15 %
Neutro Abs: 1 10*3/uL — ABNORMAL LOW (ref 1.7–7.7)
Neutrophils Relative %: 41 %
Platelet Count: 98 10*3/uL — ABNORMAL LOW (ref 150–400)
RBC: 3.64 MIL/uL — ABNORMAL LOW (ref 3.87–5.11)
RDW: 20.9 % — ABNORMAL HIGH (ref 11.5–15.5)
WBC Count: 2.4 10*3/uL — ABNORMAL LOW (ref 4.0–10.5)
nRBC: 0 % (ref 0.0–0.2)

## 2019-05-07 LAB — MAGNESIUM: Magnesium: 1.3 mg/dL — CL (ref 1.7–2.4)

## 2019-05-07 LAB — CMP (CANCER CENTER ONLY)
ALT: 24 U/L (ref 0–44)
AST: 32 U/L (ref 15–41)
Albumin: 3.4 g/dL — ABNORMAL LOW (ref 3.5–5.0)
Alkaline Phosphatase: 99 U/L (ref 38–126)
Anion gap: 9 (ref 5–15)
BUN: 6 mg/dL — ABNORMAL LOW (ref 8–23)
CO2: 25 mmol/L (ref 22–32)
Calcium: 9.4 mg/dL (ref 8.9–10.3)
Chloride: 100 mmol/L (ref 98–111)
Creatinine: 0.93 mg/dL (ref 0.44–1.00)
GFR, Est AFR Am: >60 mL/min (ref >60)
GFR, Estimated: >60 mL/min (ref >60)
Glucose, Bld: 205 mg/dL — ABNORMAL HIGH (ref 70–99)
Potassium: 5 mmol/L (ref 3.5–5.1)
Sodium: 134 mmol/L — ABNORMAL LOW (ref 135–145)
Total Bilirubin: 0.6 mg/dL (ref 0.3–1.2)
Total Protein: 6.3 g/dL — ABNORMAL LOW (ref 6.5–8.1)

## 2019-05-07 NOTE — Telephone Encounter (Signed)
Patient also wanted to mention that she doesn't take the compazine for nausea because it makes her so groggy.   She stated that she is agreeable to come back in for infusion of magnesium if PA wants to do that.  She stated that she felt much better last time when she did.  Routed to PA

## 2019-05-07 NOTE — Progress Notes (Signed)
CRITICAL VALUE ALERT  Critical Value:  Magnesium 1.3  Date & Time Notied:  05/07/2019 @ 12:27 pm  Provider Notified: Cassandra Heilingoetter, PA  Orders Received/Actions taken: Pending instructions

## 2019-05-07 NOTE — Telephone Encounter (Signed)
Call back received from magnesium level.  Patient reports trying to take two tablets a day of magnesium supplement, but during the night she reports having nausea and vomiting of acid/bile and reports diarrhea 2-3 times day.  Patient unable to tolerate taking anymore than two tablets per day orally.    Routed to [facility], PA to advise if any change in plan is warranted.

## 2019-05-07 NOTE — Telephone Encounter (Signed)
Attempted to call patient to review magnesium level from today.  LM pending call back.   Per Cassie Heilingoetter, PA need to instruct to continue to take her Magnesium supplement up to 4 times a day and to continue lab follow up.  Call if has any urgent needs.

## 2019-05-14 ENCOUNTER — Inpatient Hospital Stay: Payer: Medicare Other

## 2019-05-14 ENCOUNTER — Telehealth: Payer: Self-pay | Admitting: *Deleted

## 2019-05-14 ENCOUNTER — Other Ambulatory Visit: Payer: Self-pay

## 2019-05-14 DIAGNOSIS — Z5111 Encounter for antineoplastic chemotherapy: Secondary | ICD-10-CM | POA: Diagnosis not present

## 2019-05-14 DIAGNOSIS — C3490 Malignant neoplasm of unspecified part of unspecified bronchus or lung: Secondary | ICD-10-CM

## 2019-05-14 LAB — CBC WITH DIFFERENTIAL (CANCER CENTER ONLY)
Abs Immature Granulocytes: 0.01 10*3/uL (ref 0.00–0.07)
Basophils Absolute: 0 10*3/uL (ref 0.0–0.1)
Basophils Relative: 1 %
Eosinophils Absolute: 0.1 10*3/uL (ref 0.0–0.5)
Eosinophils Relative: 4 %
HCT: 33.6 % — ABNORMAL LOW (ref 36.0–46.0)
Hemoglobin: 11.1 g/dL — ABNORMAL LOW (ref 12.0–15.0)
Immature Granulocytes: 0 %
Lymphocytes Relative: 52 %
Lymphs Abs: 1.3 10*3/uL (ref 0.7–4.0)
MCH: 29 pg (ref 26.0–34.0)
MCHC: 33 g/dL (ref 30.0–36.0)
MCV: 87.7 fL (ref 80.0–100.0)
Monocytes Absolute: 0.5 10*3/uL (ref 0.1–1.0)
Monocytes Relative: 19 %
Neutro Abs: 0.6 10*3/uL — ABNORMAL LOW (ref 1.7–7.7)
Neutrophils Relative %: 24 %
Platelet Count: 225 10*3/uL (ref 150–400)
RBC: 3.83 MIL/uL — ABNORMAL LOW (ref 3.87–5.11)
RDW: 22.2 % — ABNORMAL HIGH (ref 11.5–15.5)
WBC Count: 2.5 10*3/uL — ABNORMAL LOW (ref 4.0–10.5)
nRBC: 0 % (ref 0.0–0.2)

## 2019-05-14 LAB — CMP (CANCER CENTER ONLY)
ALT: 11 U/L (ref 0–44)
AST: 21 U/L (ref 15–41)
Albumin: 3.8 g/dL (ref 3.5–5.0)
Alkaline Phosphatase: 95 U/L (ref 38–126)
Anion gap: 12 (ref 5–15)
BUN: 6 mg/dL — ABNORMAL LOW (ref 8–23)
CO2: 22 mmol/L (ref 22–32)
Calcium: 9.8 mg/dL (ref 8.9–10.3)
Chloride: 102 mmol/L (ref 98–111)
Creatinine: 0.88 mg/dL (ref 0.44–1.00)
GFR, Est AFR Am: >60 mL/min (ref >60)
GFR, Estimated: >60 mL/min (ref >60)
Glucose, Bld: 110 mg/dL — ABNORMAL HIGH (ref 70–99)
Potassium: 5.1 mmol/L (ref 3.5–5.1)
Sodium: 136 mmol/L (ref 135–145)
Total Bilirubin: 0.7 mg/dL (ref 0.3–1.2)
Total Protein: 6.9 g/dL (ref 6.5–8.1)

## 2019-05-14 LAB — MAGNESIUM: Magnesium: 1.4 mg/dL — CL (ref 1.7–2.4)

## 2019-05-14 NOTE — Telephone Encounter (Signed)
-----   Message from Curt Bears, MD sent at 05/14/2019 12:47 PM EDT ----- Please advise the patient was neutropenic precautions.  Thank you. ----- Message ----- From: Buel Ream, Lab In [place] Sent: 05/14/2019  12:00 PM EDT To: Curt Bears, MD

## 2019-05-14 NOTE — Telephone Encounter (Signed)
TCT patient and spoke with her regarding her lab results from today. Advised that her Mg+ remains low @1 .4. she is taking her Magnesium supplement-2 tablets BID. Also reviewed her ANC of 0.6 and reviewed neutropenic precautions.  Reminded her to call with any new symptoms, fever, chills. She voiced understanding. She states she feels fairly well right now. No questions or concerns at this time

## 2019-05-15 ENCOUNTER — Telehealth: Payer: Self-pay | Admitting: Radiation Oncology

## 2019-05-15 ENCOUNTER — Other Ambulatory Visit: Payer: Self-pay

## 2019-05-15 ENCOUNTER — Ambulatory Visit
Admission: RE | Admit: 2019-05-15 | Discharge: 2019-05-15 | Disposition: A | Discharge status: Home / Self Care | Payer: Medicare Other | Source: Ambulatory Visit | Attending: Radiation Oncology | Admitting: Radiation Oncology

## 2019-05-15 DIAGNOSIS — C3431 Malignant neoplasm of lower lobe, right bronchus or lung: Secondary | ICD-10-CM | POA: Insufficient documentation

## 2019-05-15 DIAGNOSIS — Z510 Encounter for antineoplastic radiation therapy: Secondary | ICD-10-CM | POA: Diagnosis not present

## 2019-05-15 MED ORDER — SUCRALFATE 1 G PO TABS: 1.0000 g | ORAL_TABLET | Freq: Three times a day (TID) | ORAL | 1 refills | Status: DC

## 2019-05-15 NOTE — Telephone Encounter (Signed)
I saw the patient to consent her for her lung cancer treatment. We have been in touch with her hepatology team at [facility] and because of her esophageal varices, Dr. Lisbeth Renshaw would like her to start taking Carafate at the onset of radiotherapy rather than waiting to start with esophagitis symptoms. A prescription was called into her pharmacy and she is in agreement to proceed in this manner.

## 2019-05-20 ENCOUNTER — Other Ambulatory Visit: Payer: Self-pay | Admitting: *Deleted

## 2019-05-20 DIAGNOSIS — C3491 Malignant neoplasm of unspecified part of right bronchus or lung: Secondary | ICD-10-CM

## 2019-05-21 ENCOUNTER — Inpatient Hospital Stay: Payer: Medicare Other

## 2019-05-21 ENCOUNTER — Other Ambulatory Visit: Payer: Self-pay

## 2019-05-21 ENCOUNTER — Encounter (HOSPITAL_COMMUNITY): Payer: Self-pay

## 2019-05-21 ENCOUNTER — Ambulatory Visit (HOSPITAL_COMMUNITY)
Admission: RE | Admit: 2019-05-21 | Discharge: 2019-05-21 | Disposition: A | Discharge status: Home / Self Care | Payer: Medicare Other | Source: Ambulatory Visit | Attending: Internal Medicine | Admitting: Internal Medicine

## 2019-05-21 DIAGNOSIS — C3491 Malignant neoplasm of unspecified part of right bronchus or lung: Secondary | ICD-10-CM

## 2019-05-21 DIAGNOSIS — Z5111 Encounter for antineoplastic chemotherapy: Secondary | ICD-10-CM | POA: Diagnosis not present

## 2019-05-21 DIAGNOSIS — C3490 Malignant neoplasm of unspecified part of unspecified bronchus or lung: Secondary | ICD-10-CM | POA: Insufficient documentation

## 2019-05-21 LAB — CMP (CANCER CENTER ONLY)
ALT: 12 U/L (ref 0–44)
AST: 29 U/L (ref 15–41)
Albumin: 3.9 g/dL (ref 3.5–5.0)
Alkaline Phosphatase: 88 U/L (ref 38–126)
Anion gap: 10 (ref 5–15)
BUN: 8 mg/dL (ref 8–23)
CO2: 24 mmol/L (ref 22–32)
Calcium: 9.7 mg/dL (ref 8.9–10.3)
Chloride: 104 mmol/L (ref 98–111)
Creatinine: 0.93 mg/dL (ref 0.44–1.00)
GFR, Est AFR Am: >60 mL/min (ref >60)
GFR, Estimated: >60 mL/min (ref >60)
Glucose, Bld: 109 mg/dL — ABNORMAL HIGH (ref 70–99)
Potassium: 4.7 mmol/L (ref 3.5–5.1)
Sodium: 138 mmol/L (ref 135–145)
Total Bilirubin: 0.7 mg/dL (ref 0.3–1.2)
Total Protein: 7 g/dL (ref 6.5–8.1)

## 2019-05-21 LAB — CBC WITH DIFFERENTIAL (CANCER CENTER ONLY)
Abs Immature Granulocytes: 0.04 10*3/uL (ref 0.00–0.07)
Basophils Absolute: 0 10*3/uL (ref 0.0–0.1)
Basophils Relative: 1 %
Eosinophils Absolute: 0.1 10*3/uL (ref 0.0–0.5)
Eosinophils Relative: 2 %
HCT: 34.6 % — ABNORMAL LOW (ref 36.0–46.0)
Hemoglobin: 11.4 g/dL — ABNORMAL LOW (ref 12.0–15.0)
Immature Granulocytes: 1 %
Lymphocytes Relative: 36 %
Lymphs Abs: 1.5 10*3/uL (ref 0.7–4.0)
MCH: 30 pg (ref 26.0–34.0)
MCHC: 32.9 g/dL (ref 30.0–36.0)
MCV: 91.1 fL (ref 80.0–100.0)
Monocytes Absolute: 0.6 10*3/uL (ref 0.1–1.0)
Monocytes Relative: 15 %
Neutro Abs: 1.9 10*3/uL (ref 1.7–7.7)
Neutrophils Relative %: 45 %
Platelet Count: 211 10*3/uL (ref 150–400)
RBC: 3.8 MIL/uL — ABNORMAL LOW (ref 3.87–5.11)
RDW: 21.6 % — ABNORMAL HIGH (ref 11.5–15.5)
WBC Count: 4.2 10*3/uL (ref 4.0–10.5)
nRBC: 0 % (ref 0.0–0.2)

## 2019-05-21 MED ORDER — SODIUM CHLORIDE (PF) 0.9 % IJ SOLN
INTRAMUSCULAR | Status: AC
  Filled 2019-05-21: qty 50

## 2019-05-21 MED ORDER — IOHEXOL 300 MG/ML  SOLN
75.0000 mL | Freq: Once | INTRAMUSCULAR | Status: AC | PRN
  Administered 2019-05-21: 75 mL via INTRAVENOUS

## 2019-05-22 ENCOUNTER — Ambulatory Visit: Payer: Medicare Other | Admitting: Radiation Oncology

## 2019-05-22 DIAGNOSIS — Z510 Encounter for antineoplastic radiation therapy: Secondary | ICD-10-CM | POA: Diagnosis not present

## 2019-05-23 ENCOUNTER — Ambulatory Visit: Payer: Medicare Other

## 2019-05-23 ENCOUNTER — Encounter: Payer: Self-pay | Admitting: Internal Medicine

## 2019-05-23 ENCOUNTER — Telehealth: Payer: Self-pay | Admitting: Internal Medicine

## 2019-05-23 ENCOUNTER — Ambulatory Visit
Admission: RE | Admit: 2019-05-23 | Discharge: 2019-05-23 | Disposition: A | Discharge status: Home / Self Care | Payer: Medicare Other | Source: Ambulatory Visit | Attending: Radiation Oncology | Admitting: Radiation Oncology

## 2019-05-23 ENCOUNTER — Inpatient Hospital Stay: Payer: Medicare Other | Attending: Internal Medicine | Admitting: Internal Medicine

## 2019-05-23 ENCOUNTER — Other Ambulatory Visit: Payer: Self-pay

## 2019-05-23 VITALS — BP 155/78 | HR 72 | Temp 98.2°F | Resp 18 | Ht 64.0 in | Wt 172.0 lb

## 2019-05-23 DIAGNOSIS — Z885 Allergy status to narcotic agent status: Secondary | ICD-10-CM | POA: Diagnosis not present

## 2019-05-23 DIAGNOSIS — C3431 Malignant neoplasm of lower lobe, right bronchus or lung: Secondary | ICD-10-CM | POA: Diagnosis present

## 2019-05-23 DIAGNOSIS — Z510 Encounter for antineoplastic radiation therapy: Secondary | ICD-10-CM | POA: Diagnosis present

## 2019-05-23 DIAGNOSIS — R5383 Other fatigue: Secondary | ICD-10-CM | POA: Insufficient documentation

## 2019-05-23 DIAGNOSIS — C3491 Malignant neoplasm of unspecified part of right bronchus or lung: Secondary | ICD-10-CM

## 2019-05-23 DIAGNOSIS — J439 Emphysema, unspecified: Secondary | ICD-10-CM | POA: Insufficient documentation

## 2019-05-23 DIAGNOSIS — C3490 Malignant neoplasm of unspecified part of unspecified bronchus or lung: Secondary | ICD-10-CM | POA: Diagnosis not present

## 2019-05-23 DIAGNOSIS — Z79899 Other long term (current) drug therapy: Secondary | ICD-10-CM | POA: Insufficient documentation

## 2019-05-23 DIAGNOSIS — Z886 Allergy status to analgesic agent status: Secondary | ICD-10-CM | POA: Diagnosis not present

## 2019-05-23 DIAGNOSIS — I700 Atherosclerosis of aorta: Secondary | ICD-10-CM | POA: Insufficient documentation

## 2019-05-23 DIAGNOSIS — K7460 Unspecified cirrhosis of liver: Secondary | ICD-10-CM | POA: Insufficient documentation

## 2019-05-23 DIAGNOSIS — M1990 Unspecified osteoarthritis, unspecified site: Secondary | ICD-10-CM | POA: Diagnosis not present

## 2019-05-23 DIAGNOSIS — R590 Localized enlarged lymph nodes: Secondary | ICD-10-CM | POA: Diagnosis not present

## 2019-05-23 DIAGNOSIS — I2510 Atherosclerotic heart disease of native coronary artery without angina pectoris: Secondary | ICD-10-CM | POA: Insufficient documentation

## 2019-05-23 DIAGNOSIS — E119 Type 2 diabetes mellitus without complications: Secondary | ICD-10-CM | POA: Insufficient documentation

## 2019-05-23 MED ORDER — SONAFINE EX EMUL
1.0000 "application " | Freq: Once | CUTANEOUS | Status: AC
  Administered 2019-05-23: 1 via TOPICAL

## 2019-05-23 NOTE — Progress Notes (Signed)
[place] Telephone:(336) [phone]   [phone]  OFFICE PROGRESS NOTE  [name], MD [place] [address]  DIAGNOSIS: stage IIIA (T2a, N2, M0)non-small cell lung cancer, adenocarcinoma presented with right lower lobe lung mass and mediastinal lymphadenopathy. Diagnosed in May 2020.   Biomarker Findings Microsatellite status - MS-Stable Tumor Mutational Burden - 9 Muts/Mb Genomic Findings For a complete list of the genes assayed, please refer to the Appendix. NF1 K33* TP53 R158P 8 Disease relevant genes with no reportable alterations: ALK, BRAF, EGFR, ERBB2, KRAS, MET, RET, ROS1  PDL 1 expression negative  PRIOR THERAPY:   1) Status post right lower lobectomy with lymph node dissection on Jan 07, 2019 under the care of Dr. Roxan Hockey. 2) Adjuvant platinum based regimen with cisplatin 75 mg/M2 and Alimta 500 mg/M2 every 3 weeks. First dose on June 30th, 2020.  Status post 4 cycles.  CURRENT THERAPY:  Adjuvant radiotherapy to the mediastinum under the care of Dr. Lisbeth Renshaw.  First fraction on May 23, 2019.  INTERVAL HISTORY: Sandy [name] 63 y.o. female returns to the clinic today for follow-up visit.  The patient is feeling fine today with no concerning complaints except for mild fatigue.  She tolerated the previous 4 cycles of systemic chemotherapy fairly well except for hypomagnesemia.  She is currently on magnesium oxide.  She denied having any current chest pain, shortness of breath, cough or hemoptysis.  She denied having any fever or chills.  She has no nausea, vomiting, diarrhea or constipation.  She denied having any headache or visual changes.  The patient had repeat CT scan of the chest performed recently and she is here for evaluation and discussion of her scan results.  MEDICAL HISTORY: Past Medical History:  Diagnosis Date  . Adenocarcinoma of lung, stage 3, right ([place]) 2020  . Arthritis   .  Cirrhosis ([place])   . COPD (chronic obstructive pulmonary disease) ([place])   . Dyslipidemia   . Dyspnea   . Esophageal varices ([place])   . History of blood transfusion   . History of hiatal hernia   . Hypertension   . Pneumonia   . Type 2 diabetes mellitus (HCC)    Type II    ALLERGIES:  is allergic to codeine and nsaids.  MEDICATIONS:  Current Outpatient Medications  Medication Sig Dispense Refill  . acetaminophen (TYLENOL) 500 MG tablet Take 1,000 mg by mouth every 6 (six) hours as needed for moderate pain or headache.    Marland Kitchen atorvastatin (LIPITOR) 10 MG tablet Take 10 mg by mouth at bedtime.     Marland Kitchen dexamethasone (DECADRON) 4 MG tablet Take 1 (4 mg) tablet twice a day the day before, the day of, and the day after chemotherapy 24 tablet 0  . diphenhydrAMINE (BENADRYL) 25 MG tablet Take 50 mg by mouth at bedtime as needed.    . folic acid (FOLVITE) 1 MG tablet Take 1 tablet (1 mg total) by mouth daily. 30 tablet 2  . losartan (COZAAR) 50 MG tablet Take 1 tablet (50 mg total) by mouth daily. 30 tablet 3  . magnesium oxide (MAG-OX) 400 (241.3 Mg) MG tablet Take 2 tablets (800 mg total) by mouth 2 (two) times daily. 60 tablet 2  . metFORMIN (GLUCOPHAGE-XR) 500 MG 24 hr tablet Take 500 mg by mouth at bedtime.    . nadolol (CORGARD) 40 MG tablet Take 40 mg by mouth at bedtime.    Marland Kitchen omeprazole (PRILOSEC)  20 MG capsule Take 20 mg by mouth daily.     . prochlorperazine (COMPAZINE) 10 MG tablet Take 1 tablet (10 mg total) by mouth every 6 (six) hours as needed for nausea or vomiting. (Patient not taking: Reported on 03/26/2019) 30 tablet 0  . sucralfate (CARAFATE) 1 g tablet Take 1 tablet (1 g total) by mouth 4 (four) times daily -  with meals and at bedtime. Crush and mix in 1 oz water 120 tablet 1  . Tetrahydrozoline HCl (VISINE OP) Place 1 drop into both eyes daily as needed (irritation).     No current facility-administered medications for this visit.     SURGICAL HISTORY:  Past Surgical History:   Procedure Laterality Date  . ABDOMINAL HYSTERECTOMY    . ANTERIOR CRUCIATE LIGAMENT REPAIR Left    x 2  . ESOPHAGOGASTRODUODENOSCOPY    . LOBECTOMY Right 01/07/2019   Procedure: RIGHT LOWER LOBE LOBECTOMY AND INTERCOSTAL NERVE BLOCK;  Surgeon: Melrose Nakayama, MD;  Location: [facility];  Service: Thoracic;  Laterality: Right;  Marland Kitchen VIDEO ASSISTED THORACOSCOPY (VATS)/WEDGE RESECTION Right 01/07/2019   Procedure: VIDEO ASSISTED THORACOSCOPY (VATS) RIGHT LOWER LOBE WEDGE RESECTION;  Surgeon: Melrose Nakayama, MD;  Location: [facility];  Service: Thoracic;  Laterality: Right;    REVIEW OF SYSTEMS:  Constitutional: positive for fatigue Eyes: negative Ears, nose, mouth, throat, and face: negative Respiratory: negative Cardiovascular: negative Gastrointestinal: negative Genitourinary:negative Integument/breast: negative Hematologic/lymphatic: negative Musculoskeletal:negative Neurological: negative Behavioral/Psych: negative Endocrine: negative Allergic/Immunologic: negative   PHYSICAL EXAMINATION: General appearance: alert, cooperative and no distress Head: Normocephalic, without obvious abnormality, atraumatic Neck: no adenopathy, no JVD, supple, symmetrical, trachea midline and thyroid not enlarged, symmetric, no tenderness/mass/nodules Lymph nodes: Cervical, supraclavicular, and axillary nodes normal. Resp: clear to auscultation bilaterally Back: symmetric, no curvature. ROM normal. No CVA tenderness. Cardio: regular rate and rhythm, S1, S2 normal, no murmur, click, rub or gallop GI: soft, non-tender; bowel sounds normal; no masses,  no organomegaly Extremities: extremities normal, atraumatic, no cyanosis or edema Neurologic: Alert and oriented X 3, normal strength and tone. Normal symmetric reflexes. Normal coordination and gait  ECOG PERFORMANCE STATUS: 1 - Symptomatic but completely ambulatory  Blood pressure (!) 155/78, pulse 72, temperature 98.2 F (36.8 C), temperature source  Temporal, resp. rate 18, height _0  (1.626 m), weight 172 lb (78 kg), SpO2 100 %.  LABORATORY DATA: Lab Results  Component Value Date   WBC 4.2 05/21/2019   HGB 11.4 (L) 05/21/2019   HCT 34.6 (L) 05/21/2019   MCV 91.1 05/21/2019   PLT 211 05/21/2019      Chemistry      Component Value Date/Time   NA 138 05/21/2019 1102   K 4.7 05/21/2019 1102   CL 104 05/21/2019 1102   CO2 24 05/21/2019 1102   BUN 8 05/21/2019 1102   CREATININE 0.93 05/21/2019 1102      Component Value Date/Time   CALCIUM 9.7 05/21/2019 1102   ALKPHOS 88 05/21/2019 1102   AST 29 05/21/2019 1102   ALT 12 05/21/2019 1102   BILITOT 0.7 05/21/2019 1102       RADIOGRAPHIC STUDIES: Ct Chest W Contrast  Result Date: 05/21/2019 CLINICAL DATA:  Non-small cell lung cancer, staging. Completion of chemotherapy 04/23/2019. post right lower lobectomy. EXAM: CT CHEST WITH CONTRAST TECHNIQUE: Multidetector CT imaging of the chest was performed during intravenous contrast administration. CONTRAST:  41m OMNIPAQUE IOHEXOL 300 MG/ML  SOLN COMPARISON:  10/23/2018 FINDINGS: Cardiovascular: Moderate calcific and noncalcific aortic atherosclerosis without signs of  aneurysm. Mild cardiac enlargement with coronary artery disease. Central pulmonary arteries are normal assigned changes of right lower lobectomy distorting right hilum. Mediastinum/Nodes: Postoperative changes about the right hilum. Stable 9 mm right paratracheal lymph node. No hilar adenopathy. Mild circumferential thickening of the distal esophagus is a stable finding the setting of hilar. Lungs/Pleura: Signs of emphysema are redemonstrated. Moderate centrilobular and paraseptal changes mainly central have. No signs of consolidation. No new suspicious nodule. Linear opacity with mild thickening along the minor fissure in the right chest not present on preoperative imaging. Extending towards pleural surface in the right mid chest. Upper Abdomen: Marked cirrhosis with signs  of splenomegaly. Stable appearance of upper pole cysts better demonstrated on MRI assessment of 01/31/2019. Musculoskeletal: No axillary or supraclavicular lymphadenopathy. No signs of acute bone finding or destructive bone process. IMPRESSION: 1. Post right lower lobectomy with vague triangular opacity appearing more linear on coronal imaging likely scarring along the minor fissure, attention on follow-up. 2. No signs of residual recurrent disease on the current study. 3. Signs of cirrhosis and splenomegaly. 4. Renal cysts better characterized on recent MRI evaluation. Aortic Atherosclerosis (ICD10-I70.0) and Emphysema (ICD10-J43.9). Electronically Signed   By: Zetta Bills M.D.   On: 05/21/2019 14:05    ASSESSMENT AND PLAN: This is a very pleasant 63 years old white female with stage IIIA non-small cell lung cancer, adenocarcinoma with no actionable mutations status post right lower lobectomy with lymph node dissection under the care of Dr. Roxan Hockey on Jan 07, 2019. The patient completed adjuvant systemic chemotherapy with cisplatin and Alimta status post 4 cycles. The patient tolerated her treatment well except for increasing fatigue after the last cycle of her treatment. She had repeat CT scan of the chest performed recently.  I personally and independently reviewed the scans and discussed the results with the patient today. Her scan showed no concerning findings for disease recurrence or progression. I recommended for the patient to proceed with the adjuvant radiotherapy to the mediastinum as planned by Dr. Lisbeth Renshaw. I will see her back for follow-up visit in 3 months for evaluation with repeat CT scan of the chest for restaging of her disease. For hypertension she will continue to monitor it closely at home. For the hypomagnesemia she will continue on magnesium oxide to 400 mg p.o. 4 times daily.  We will monitor her electrolytes closely during her treatment. The patient was advised to call  immediately if she has any concerning symptoms in the interval. The patient voices understanding of current disease status and treatment options and is in agreement with the current care plan. All questions were answered. The patient knows to call the clinic with any problems, questions or concerns. We can certainly see the patient much sooner if necessary.   Disclaimer: This note was dictated with voice recognition software. Similar sounding words can inadvertently be transcribed and may not be corrected upon review.

## 2019-05-23 NOTE — Telephone Encounter (Signed)
No los for 10/1

## 2019-05-23 NOTE — Progress Notes (Signed)
Pt here for patient teaching.  Pt given Radiation and You booklet and Sonafine.  Reviewed areas of pertinence such as fatigue, hair loss, skin changes, throat changes, cough and shortness of breath . Pt able to give teach back of to pat skin and use unscented/gentle soap,apply Sonafine bid and avoid applying anything to skin within 4 hours of treatment. Pt verbalizes understanding of information given and will contact nursing with any questions or concerns.    Sandy [name]. Leonie Green, BSN

## 2019-05-23 NOTE — Progress Notes (Signed)
  Radiation Oncology         [phone]) [phone] ________________________________  Name: REBECA VALDIVIA MRN: [id number]  Date: 05/15/2019  DOB: [birth year]  SIMULATION AND TREATMENT PLANNING NOTE  DIAGNOSIS:     ICD-10-CM   1. Malignant neoplasm of bronchus of lower lobe, right [facility])  C34.31      Site:  chest  NARRATIVE:  The patient was brought to the [facility].  Identity was confirmed.  All relevant records and images related to the planned course of therapy were reviewed.   Written consent to proceed with treatment was confirmed which was freely given after reviewing the details related to the planned course of therapy had been reviewed with the patient.  Then, the patient was set-up in a stable reproducible  supine position for radiation therapy.  CT images were obtained.  Surface markings were placed.    Medically necessary complex treatment device(s) for immobilization:  Vac-lock bag.   The CT images were loaded into the planning software.  Then the target and avoidance structures were contoured.  Treatment planning then occurred.  The radiation prescription was entered and confirmed.  A total of 4 complex treatment devices were fabricated which relate to the designed radiation treatment fields. Additional reduced fields will be used as necessary to improve the dose homogeneity of the plan. Each of these customized fields/ complex treatment devices will be used on a daily basis during the radiation course. I have requested : 3D Simulation  I have requested a DVH of the following structures: target volume, spinal cord, lungs, heart.   The patient will undergo daily image guidance to ensure accurate localization of the target, and adequate minimize dose to the normal surrounding structures in close proximity to the target.  PLAN:  The patient will receive 60 Gy in 30 fractions.   Special treatment procedure The patient will also receive concurrent chemotherapy during the  treatment. The patient may therefore experience increased toxicity or side effects and the patient will be monitored for such problems. This may require extra lab work as necessary. This therefore constitutes a special treatment procedure.   ________________________________   Jodelle Gross, MD, PhD

## 2019-05-24 ENCOUNTER — Other Ambulatory Visit: Payer: Self-pay | Admitting: Physician Assistant

## 2019-05-24 ENCOUNTER — Ambulatory Visit
Admission: RE | Admit: 2019-05-24 | Discharge: 2019-05-24 | Disposition: A | Discharge status: Home / Self Care | Payer: Medicare Other | Source: Ambulatory Visit | Attending: Radiation Oncology | Admitting: Radiation Oncology

## 2019-05-24 ENCOUNTER — Other Ambulatory Visit: Payer: Self-pay

## 2019-05-24 DIAGNOSIS — Z510 Encounter for antineoplastic radiation therapy: Secondary | ICD-10-CM | POA: Diagnosis not present

## 2019-05-27 ENCOUNTER — Other Ambulatory Visit: Payer: Self-pay

## 2019-05-27 ENCOUNTER — Ambulatory Visit
Admission: RE | Admit: 2019-05-27 | Discharge: 2019-05-27 | Disposition: A | Discharge status: Home / Self Care | Payer: Medicare Other | Source: Ambulatory Visit | Attending: Radiation Oncology | Admitting: Radiation Oncology

## 2019-05-27 DIAGNOSIS — Z510 Encounter for antineoplastic radiation therapy: Secondary | ICD-10-CM | POA: Diagnosis not present

## 2019-05-28 ENCOUNTER — Ambulatory Visit
Admission: RE | Admit: 2019-05-28 | Discharge: 2019-05-28 | Disposition: A | Discharge status: Home / Self Care | Payer: Medicare Other | Source: Ambulatory Visit | Attending: Radiation Oncology | Admitting: Radiation Oncology

## 2019-05-28 ENCOUNTER — Other Ambulatory Visit: Payer: Self-pay

## 2019-05-28 DIAGNOSIS — Z510 Encounter for antineoplastic radiation therapy: Secondary | ICD-10-CM | POA: Diagnosis not present

## 2019-05-29 ENCOUNTER — Ambulatory Visit
Admission: RE | Admit: 2019-05-29 | Discharge: 2019-05-29 | Disposition: A | Discharge status: Home / Self Care | Payer: Medicare Other | Source: Ambulatory Visit | Attending: Radiation Oncology | Admitting: Radiation Oncology

## 2019-05-29 ENCOUNTER — Other Ambulatory Visit: Payer: Self-pay

## 2019-05-29 DIAGNOSIS — Z510 Encounter for antineoplastic radiation therapy: Secondary | ICD-10-CM | POA: Diagnosis not present

## 2019-05-30 ENCOUNTER — Ambulatory Visit
Admission: RE | Admit: 2019-05-30 | Discharge: 2019-05-30 | Disposition: A | Discharge status: Home / Self Care | Payer: Medicare Other | Source: Ambulatory Visit | Attending: Radiation Oncology | Admitting: Radiation Oncology

## 2019-05-30 ENCOUNTER — Other Ambulatory Visit: Payer: Self-pay

## 2019-05-30 DIAGNOSIS — Z510 Encounter for antineoplastic radiation therapy: Secondary | ICD-10-CM | POA: Diagnosis not present

## 2019-05-31 ENCOUNTER — Ambulatory Visit
Admission: RE | Admit: 2019-05-31 | Discharge: 2019-05-31 | Disposition: A | Discharge status: Home / Self Care | Payer: Medicare Other | Source: Ambulatory Visit | Attending: Radiation Oncology | Admitting: Radiation Oncology

## 2019-05-31 ENCOUNTER — Other Ambulatory Visit: Payer: Self-pay

## 2019-05-31 DIAGNOSIS — Z510 Encounter for antineoplastic radiation therapy: Secondary | ICD-10-CM | POA: Diagnosis not present

## 2019-06-03 ENCOUNTER — Ambulatory Visit
Admission: RE | Admit: 2019-06-03 | Discharge: 2019-06-03 | Disposition: A | Discharge status: Home / Self Care | Payer: Medicare Other | Source: Ambulatory Visit | Attending: Radiation Oncology | Admitting: Radiation Oncology

## 2019-06-03 ENCOUNTER — Other Ambulatory Visit: Payer: Self-pay

## 2019-06-03 DIAGNOSIS — Z510 Encounter for antineoplastic radiation therapy: Secondary | ICD-10-CM | POA: Diagnosis not present

## 2019-06-04 ENCOUNTER — Ambulatory Visit
Admission: RE | Admit: 2019-06-04 | Discharge: 2019-06-04 | Disposition: A | Discharge status: Home / Self Care | Payer: Medicare Other | Source: Ambulatory Visit | Attending: Radiation Oncology | Admitting: Radiation Oncology

## 2019-06-04 ENCOUNTER — Other Ambulatory Visit: Payer: Self-pay

## 2019-06-04 DIAGNOSIS — Z510 Encounter for antineoplastic radiation therapy: Secondary | ICD-10-CM | POA: Diagnosis not present

## 2019-06-05 ENCOUNTER — Ambulatory Visit
Admission: RE | Admit: 2019-06-05 | Discharge: 2019-06-05 | Disposition: A | Discharge status: Home / Self Care | Payer: Medicare Other | Source: Ambulatory Visit | Attending: Radiation Oncology | Admitting: Radiation Oncology

## 2019-06-05 ENCOUNTER — Other Ambulatory Visit: Payer: Self-pay

## 2019-06-05 DIAGNOSIS — Z510 Encounter for antineoplastic radiation therapy: Secondary | ICD-10-CM | POA: Diagnosis not present

## 2019-06-06 ENCOUNTER — Ambulatory Visit
Admission: RE | Admit: 2019-06-06 | Discharge: 2019-06-06 | Disposition: A | Discharge status: Home / Self Care | Payer: Medicare Other | Source: Ambulatory Visit | Attending: Radiation Oncology | Admitting: Radiation Oncology

## 2019-06-06 ENCOUNTER — Other Ambulatory Visit: Payer: Self-pay

## 2019-06-06 DIAGNOSIS — Z510 Encounter for antineoplastic radiation therapy: Secondary | ICD-10-CM | POA: Diagnosis not present

## 2019-06-07 ENCOUNTER — Ambulatory Visit
Admission: RE | Admit: 2019-06-07 | Discharge: 2019-06-07 | Disposition: A | Discharge status: Home / Self Care | Payer: Medicare Other | Source: Ambulatory Visit | Attending: Radiation Oncology | Admitting: Radiation Oncology

## 2019-06-07 ENCOUNTER — Other Ambulatory Visit: Payer: Self-pay

## 2019-06-07 DIAGNOSIS — Z510 Encounter for antineoplastic radiation therapy: Secondary | ICD-10-CM | POA: Diagnosis not present

## 2019-06-10 ENCOUNTER — Other Ambulatory Visit: Payer: Self-pay

## 2019-06-10 ENCOUNTER — Ambulatory Visit
Admission: RE | Admit: 2019-06-10 | Discharge: 2019-06-10 | Disposition: A | Discharge status: Home / Self Care | Payer: Medicare Other | Source: Ambulatory Visit | Attending: Radiation Oncology | Admitting: Radiation Oncology

## 2019-06-10 DIAGNOSIS — Z510 Encounter for antineoplastic radiation therapy: Secondary | ICD-10-CM | POA: Diagnosis not present

## 2019-06-11 ENCOUNTER — Other Ambulatory Visit: Payer: Self-pay

## 2019-06-11 ENCOUNTER — Ambulatory Visit
Admission: RE | Admit: 2019-06-11 | Discharge: 2019-06-11 | Disposition: A | Discharge status: Home / Self Care | Payer: Medicare Other | Source: Ambulatory Visit | Attending: Radiation Oncology | Admitting: Radiation Oncology

## 2019-06-11 DIAGNOSIS — Z510 Encounter for antineoplastic radiation therapy: Secondary | ICD-10-CM | POA: Diagnosis not present

## 2019-06-12 ENCOUNTER — Other Ambulatory Visit: Payer: Self-pay

## 2019-06-12 ENCOUNTER — Ambulatory Visit
Admission: RE | Admit: 2019-06-12 | Discharge: 2019-06-12 | Disposition: A | Discharge status: Home / Self Care | Payer: Medicare Other | Source: Ambulatory Visit | Attending: Radiation Oncology | Admitting: Radiation Oncology

## 2019-06-12 DIAGNOSIS — Z510 Encounter for antineoplastic radiation therapy: Secondary | ICD-10-CM | POA: Diagnosis not present

## 2019-06-13 ENCOUNTER — Ambulatory Visit
Admission: RE | Admit: 2019-06-13 | Discharge: 2019-06-13 | Disposition: A | Discharge status: Home / Self Care | Payer: Medicare Other | Source: Ambulatory Visit | Attending: Radiation Oncology | Admitting: Radiation Oncology

## 2019-06-13 ENCOUNTER — Other Ambulatory Visit: Payer: Self-pay

## 2019-06-13 DIAGNOSIS — Z510 Encounter for antineoplastic radiation therapy: Secondary | ICD-10-CM | POA: Diagnosis not present

## 2019-06-14 ENCOUNTER — Other Ambulatory Visit: Payer: Self-pay

## 2019-06-14 ENCOUNTER — Ambulatory Visit
Admission: RE | Admit: 2019-06-14 | Discharge: 2019-06-14 | Disposition: A | Discharge status: Home / Self Care | Payer: Medicare Other | Source: Ambulatory Visit | Attending: Radiation Oncology | Admitting: Radiation Oncology

## 2019-06-14 DIAGNOSIS — Z510 Encounter for antineoplastic radiation therapy: Secondary | ICD-10-CM | POA: Diagnosis not present

## 2019-06-17 ENCOUNTER — Other Ambulatory Visit: Payer: Self-pay

## 2019-06-17 ENCOUNTER — Ambulatory Visit
Admission: RE | Admit: 2019-06-17 | Discharge: 2019-06-17 | Disposition: A | Discharge status: Home / Self Care | Payer: Medicare Other | Source: Ambulatory Visit | Attending: Radiation Oncology | Admitting: Radiation Oncology

## 2019-06-17 DIAGNOSIS — Z510 Encounter for antineoplastic radiation therapy: Secondary | ICD-10-CM | POA: Diagnosis not present

## 2019-06-18 ENCOUNTER — Ambulatory Visit
Admission: RE | Admit: 2019-06-18 | Discharge: 2019-06-18 | Disposition: A | Discharge status: Home / Self Care | Payer: Medicare Other | Source: Ambulatory Visit | Attending: Radiation Oncology | Admitting: Radiation Oncology

## 2019-06-18 ENCOUNTER — Other Ambulatory Visit: Payer: Self-pay

## 2019-06-18 ENCOUNTER — Encounter: Payer: Self-pay | Admitting: Internal Medicine

## 2019-06-18 ENCOUNTER — Telehealth: Payer: Self-pay | Admitting: Internal Medicine

## 2019-06-18 DIAGNOSIS — Z510 Encounter for antineoplastic radiation therapy: Secondary | ICD-10-CM | POA: Diagnosis not present

## 2019-06-18 NOTE — Telephone Encounter (Signed)
Scheduled appt per 10/27 sch message - pt aware of appt date and time

## 2019-06-19 ENCOUNTER — Ambulatory Visit
Admission: RE | Admit: 2019-06-19 | Discharge: 2019-06-19 | Disposition: A | Discharge status: Home / Self Care | Payer: Medicare Other | Source: Ambulatory Visit | Attending: Radiation Oncology | Admitting: Radiation Oncology

## 2019-06-19 ENCOUNTER — Other Ambulatory Visit: Payer: Self-pay

## 2019-06-19 DIAGNOSIS — Z510 Encounter for antineoplastic radiation therapy: Secondary | ICD-10-CM | POA: Diagnosis not present

## 2019-06-20 ENCOUNTER — Ambulatory Visit
Admission: RE | Admit: 2019-06-20 | Discharge: 2019-06-20 | Disposition: A | Discharge status: Home / Self Care | Payer: Medicare Other | Source: Ambulatory Visit | Attending: Radiation Oncology | Admitting: Radiation Oncology

## 2019-06-20 ENCOUNTER — Other Ambulatory Visit: Payer: Self-pay

## 2019-06-20 DIAGNOSIS — Z510 Encounter for antineoplastic radiation therapy: Secondary | ICD-10-CM | POA: Diagnosis not present

## 2019-06-21 ENCOUNTER — Ambulatory Visit
Admission: RE | Admit: 2019-06-21 | Discharge: 2019-06-21 | Disposition: A | Discharge status: Home / Self Care | Payer: Medicare Other | Source: Ambulatory Visit | Attending: Radiation Oncology | Admitting: Radiation Oncology

## 2019-06-21 ENCOUNTER — Other Ambulatory Visit: Payer: Self-pay

## 2019-06-21 DIAGNOSIS — Z510 Encounter for antineoplastic radiation therapy: Secondary | ICD-10-CM | POA: Diagnosis not present

## 2019-06-22 ENCOUNTER — Ambulatory Visit: Payer: Medicare Other

## 2019-06-24 ENCOUNTER — Other Ambulatory Visit: Payer: Self-pay

## 2019-06-24 ENCOUNTER — Ambulatory Visit
Admission: RE | Admit: 2019-06-24 | Discharge: 2019-06-24 | Disposition: A | Discharge status: Home / Self Care | Payer: Medicare Other | Source: Ambulatory Visit | Attending: Radiation Oncology | Admitting: Radiation Oncology

## 2019-06-24 DIAGNOSIS — C3431 Malignant neoplasm of lower lobe, right bronchus or lung: Secondary | ICD-10-CM | POA: Insufficient documentation

## 2019-06-24 DIAGNOSIS — Z510 Encounter for antineoplastic radiation therapy: Secondary | ICD-10-CM | POA: Diagnosis not present

## 2019-06-25 ENCOUNTER — Ambulatory Visit
Admission: RE | Admit: 2019-06-25 | Discharge: 2019-06-25 | Disposition: A | Discharge status: Home / Self Care | Payer: Medicare Other | Source: Ambulatory Visit | Attending: Radiation Oncology | Admitting: Radiation Oncology

## 2019-06-25 ENCOUNTER — Other Ambulatory Visit: Payer: Self-pay

## 2019-06-25 DIAGNOSIS — Z510 Encounter for antineoplastic radiation therapy: Secondary | ICD-10-CM | POA: Diagnosis not present

## 2019-06-26 ENCOUNTER — Ambulatory Visit
Admission: RE | Admit: 2019-06-26 | Discharge: 2019-06-26 | Disposition: A | Discharge status: Home / Self Care | Payer: Medicare Other | Source: Ambulatory Visit | Attending: Radiation Oncology | Admitting: Radiation Oncology

## 2019-06-26 ENCOUNTER — Other Ambulatory Visit: Payer: Self-pay

## 2019-06-26 DIAGNOSIS — Z510 Encounter for antineoplastic radiation therapy: Secondary | ICD-10-CM | POA: Diagnosis not present

## 2019-06-27 ENCOUNTER — Ambulatory Visit
Admission: RE | Admit: 2019-06-27 | Discharge: 2019-06-27 | Disposition: A | Discharge status: Home / Self Care | Payer: Medicare Other | Source: Ambulatory Visit | Attending: Radiation Oncology | Admitting: Radiation Oncology

## 2019-06-27 ENCOUNTER — Other Ambulatory Visit: Payer: Self-pay

## 2019-06-27 DIAGNOSIS — Z510 Encounter for antineoplastic radiation therapy: Secondary | ICD-10-CM | POA: Diagnosis not present

## 2019-06-28 ENCOUNTER — Other Ambulatory Visit: Payer: Self-pay

## 2019-06-28 ENCOUNTER — Ambulatory Visit
Admission: RE | Admit: 2019-06-28 | Discharge: 2019-06-28 | Disposition: A | Discharge status: Home / Self Care | Payer: Medicare Other | Source: Ambulatory Visit | Attending: Radiation Oncology | Admitting: Radiation Oncology

## 2019-06-28 DIAGNOSIS — Z510 Encounter for antineoplastic radiation therapy: Secondary | ICD-10-CM | POA: Diagnosis not present

## 2019-07-01 ENCOUNTER — Other Ambulatory Visit: Payer: Self-pay

## 2019-07-01 ENCOUNTER — Ambulatory Visit
Admission: RE | Admit: 2019-07-01 | Discharge: 2019-07-01 | Disposition: A | Discharge status: Home / Self Care | Payer: Medicare Other | Source: Ambulatory Visit | Attending: Radiation Oncology | Admitting: Radiation Oncology

## 2019-07-01 DIAGNOSIS — Z510 Encounter for antineoplastic radiation therapy: Secondary | ICD-10-CM | POA: Diagnosis not present

## 2019-07-02 ENCOUNTER — Other Ambulatory Visit: Payer: Self-pay

## 2019-07-02 ENCOUNTER — Ambulatory Visit
Admission: RE | Admit: 2019-07-02 | Discharge: 2019-07-02 | Disposition: A | Discharge status: Home / Self Care | Payer: Medicare Other | Source: Ambulatory Visit | Attending: Radiation Oncology | Admitting: Radiation Oncology

## 2019-07-02 DIAGNOSIS — Z510 Encounter for antineoplastic radiation therapy: Secondary | ICD-10-CM | POA: Diagnosis not present

## 2019-07-03 ENCOUNTER — Ambulatory Visit
Admission: RE | Admit: 2019-07-03 | Discharge: 2019-07-03 | Disposition: A | Discharge status: Home / Self Care | Payer: Medicare Other | Source: Ambulatory Visit | Attending: Radiation Oncology | Admitting: Radiation Oncology

## 2019-07-03 ENCOUNTER — Encounter: Payer: Self-pay | Admitting: Radiation Oncology

## 2019-07-03 ENCOUNTER — Other Ambulatory Visit: Payer: Self-pay

## 2019-07-03 ENCOUNTER — Ambulatory Visit: Payer: Medicare Other

## 2019-07-03 DIAGNOSIS — Z510 Encounter for antineoplastic radiation therapy: Secondary | ICD-10-CM | POA: Diagnosis not present

## 2019-07-04 ENCOUNTER — Ambulatory Visit: Payer: Medicare Other

## 2019-07-05 ENCOUNTER — Ambulatory Visit: Payer: Medicare Other

## 2019-07-08 ENCOUNTER — Ambulatory Visit: Payer: Medicare Other

## 2019-08-08 ENCOUNTER — Telehealth: Payer: Self-pay | Admitting: Radiation Oncology

## 2019-08-08 NOTE — Telephone Encounter (Signed)
  Radiation Oncology         [phone]) [phone] ________________________________  Name: Sandy [name] MRN: [id number]  Date of Service: 08/08/2019  DOB: 11/11/1955  Post Treatment Telephone Note  Diagnosis: Stage IIIA, pT2N2, NSCLC, adenocarcinoma of the RLL.  Interval Since Last Radiation: 5 weeks   05/23/2019-07/03/2019:  The patient's RLL target and regional nodes were treated to 60 Gy in 30 fractions.   Narrative:  The patient was contacted today for routine follow-up. During treatment she did very well with radiotherapy and did not have significant desquamation but did have some esophagitis. She reports she still has some mild GERD like symptoms despite prilosec and tums. She reports she has had persistent fatigue and recently started losing her hair. Her chemo regimen did not cause this however. She denies palpitations, headaches, changes in bowel or bladder activity.   Impression/Plan: 1. Stage IIIA, pT2N2, NSCLC, adenocarcinoma of the RLL. The patient has been doing well since completion of radiotherapy. We discussed that we would be happy to continue to follow her as needed, but she will also continue to follow up with Dr. Julien Nordmann in medical oncology.  2. Alopecia. She was encouraged to try to have her PCP check her thyroid levels to rule out this as a source as her radiotherapy should not have contributed to this, nor her chemotherapy regimen.  3. GERD. She was encouraged to continue her prilosec and that she could also add an H2 blocker as well to see if this would help.     Carola Rhine, PAC

## 2019-08-12 ENCOUNTER — Telehealth: Payer: Self-pay | Admitting: Medical Oncology

## 2019-08-12 NOTE — Telephone Encounter (Signed)
CT scan jan 4th . I cancelled tomorrows appt with [facility] and sent schedule message.

## 2019-08-13 ENCOUNTER — Inpatient Hospital Stay: Payer: Medicare Other

## 2019-08-13 ENCOUNTER — Inpatient Hospital Stay: Payer: Medicare Other | Admitting: Internal Medicine

## 2019-08-14 ENCOUNTER — Telehealth: Payer: Self-pay | Admitting: Internal Medicine

## 2019-08-14 NOTE — Telephone Encounter (Signed)
Scheduled appt per 12/21 sch message - pt is aware of appt date and time

## 2019-08-20 ENCOUNTER — Other Ambulatory Visit: Payer: Self-pay | Admitting: Nurse Practitioner

## 2019-08-20 DIAGNOSIS — K7469 Other cirrhosis of liver: Secondary | ICD-10-CM

## 2019-08-21 ENCOUNTER — Telehealth: Payer: Self-pay | Admitting: Internal Medicine

## 2019-08-21 NOTE — Telephone Encounter (Signed)
Scheduled apt per 12/30 sch message - unable to reach pt. Left message with appt date and time

## 2019-08-26 ENCOUNTER — Ambulatory Visit (HOSPITAL_COMMUNITY)
Admission: RE | Admit: 2019-08-26 | Discharge: 2019-08-26 | Disposition: A | Discharge status: Home / Self Care | Payer: Medicare Other | Source: Ambulatory Visit | Attending: Internal Medicine | Admitting: Internal Medicine

## 2019-08-26 ENCOUNTER — Inpatient Hospital Stay: Payer: Medicare Other | Attending: Internal Medicine

## 2019-08-26 ENCOUNTER — Encounter (HOSPITAL_COMMUNITY): Payer: Self-pay

## 2019-08-26 ENCOUNTER — Other Ambulatory Visit: Payer: Self-pay

## 2019-08-26 DIAGNOSIS — I700 Atherosclerosis of aorta: Secondary | ICD-10-CM | POA: Insufficient documentation

## 2019-08-26 DIAGNOSIS — R590 Localized enlarged lymph nodes: Secondary | ICD-10-CM | POA: Insufficient documentation

## 2019-08-26 DIAGNOSIS — Z885 Allergy status to narcotic agent status: Secondary | ICD-10-CM | POA: Diagnosis not present

## 2019-08-26 DIAGNOSIS — C3490 Malignant neoplasm of unspecified part of unspecified bronchus or lung: Secondary | ICD-10-CM | POA: Diagnosis present

## 2019-08-26 DIAGNOSIS — R5383 Other fatigue: Secondary | ICD-10-CM | POA: Diagnosis not present

## 2019-08-26 DIAGNOSIS — J90 Pleural effusion, not elsewhere classified: Secondary | ICD-10-CM | POA: Diagnosis not present

## 2019-08-26 DIAGNOSIS — I8510 Secondary esophageal varices without bleeding: Secondary | ICD-10-CM | POA: Insufficient documentation

## 2019-08-26 DIAGNOSIS — Z79899 Other long term (current) drug therapy: Secondary | ICD-10-CM | POA: Insufficient documentation

## 2019-08-26 DIAGNOSIS — Z886 Allergy status to analgesic agent status: Secondary | ICD-10-CM | POA: Insufficient documentation

## 2019-08-26 DIAGNOSIS — J439 Emphysema, unspecified: Secondary | ICD-10-CM | POA: Insufficient documentation

## 2019-08-26 DIAGNOSIS — K7460 Unspecified cirrhosis of liver: Secondary | ICD-10-CM | POA: Insufficient documentation

## 2019-08-26 DIAGNOSIS — E119 Type 2 diabetes mellitus without complications: Secondary | ICD-10-CM | POA: Diagnosis not present

## 2019-08-26 DIAGNOSIS — I10 Essential (primary) hypertension: Secondary | ICD-10-CM | POA: Insufficient documentation

## 2019-08-26 DIAGNOSIS — C3431 Malignant neoplasm of lower lobe, right bronchus or lung: Secondary | ICD-10-CM | POA: Diagnosis present

## 2019-08-26 DIAGNOSIS — R0609 Other forms of dyspnea: Secondary | ICD-10-CM | POA: Insufficient documentation

## 2019-08-26 DIAGNOSIS — R05 Cough: Secondary | ICD-10-CM | POA: Insufficient documentation

## 2019-08-26 DIAGNOSIS — R161 Splenomegaly, not elsewhere classified: Secondary | ICD-10-CM | POA: Diagnosis not present

## 2019-08-26 LAB — CMP (CANCER CENTER ONLY)
ALT: 34 U/L (ref 0–44)
AST: 67 U/L — ABNORMAL HIGH (ref 15–41)
Albumin: 3.8 g/dL (ref 3.5–5.0)
Alkaline Phosphatase: 103 U/L (ref 38–126)
Anion gap: 17 — ABNORMAL HIGH (ref 5–15)
BUN: 11 mg/dL (ref 8–23)
CO2: 22 mmol/L (ref 22–32)
Calcium: 9.3 mg/dL (ref 8.9–10.3)
Chloride: 98 mmol/L (ref 98–111)
Creatinine: 0.79 mg/dL (ref 0.44–1.00)
GFR, Est AFR Am: >60 mL/min (ref >60)
GFR, Estimated: >60 mL/min (ref >60)
Glucose, Bld: 80 mg/dL (ref 70–99)
Potassium: 4.7 mmol/L (ref 3.5–5.1)
Sodium: 137 mmol/L (ref 135–145)
Total Bilirubin: 1.7 mg/dL — ABNORMAL HIGH (ref 0.3–1.2)
Total Protein: 7.4 g/dL (ref 6.5–8.1)

## 2019-08-26 LAB — CBC WITH DIFFERENTIAL (CANCER CENTER ONLY)
Abs Immature Granulocytes: 0.01 10*3/uL (ref 0.00–0.07)
Basophils Absolute: 0 10*3/uL (ref 0.0–0.1)
Basophils Relative: 1 %
Eosinophils Absolute: 0.1 10*3/uL (ref 0.0–0.5)
Eosinophils Relative: 2 %
HCT: 36.9 % (ref 36.0–46.0)
Hemoglobin: 12.7 g/dL (ref 12.0–15.0)
Immature Granulocytes: 0 %
Lymphocytes Relative: 14 %
Lymphs Abs: 0.5 10*3/uL — ABNORMAL LOW (ref 0.7–4.0)
MCH: 33.2 pg (ref 26.0–34.0)
MCHC: 34.4 g/dL (ref 30.0–36.0)
MCV: 96.3 fL (ref 80.0–100.0)
Monocytes Absolute: 0.5 10*3/uL (ref 0.1–1.0)
Monocytes Relative: 13 %
Neutro Abs: 2.5 10*3/uL (ref 1.7–7.7)
Neutrophils Relative %: 70 %
Platelet Count: 187 10*3/uL (ref 150–400)
RBC: 3.83 MIL/uL — ABNORMAL LOW (ref 3.87–5.11)
RDW: 14.1 % (ref 11.5–15.5)
WBC Count: 3.5 10*3/uL — ABNORMAL LOW (ref 4.0–10.5)
nRBC: 0 % (ref 0.0–0.2)

## 2019-08-26 LAB — MAGNESIUM: Magnesium: 1.5 mg/dL — ABNORMAL LOW (ref 1.7–2.4)

## 2019-08-26 MED ORDER — IOHEXOL 300 MG/ML  SOLN
75.0000 mL | Freq: Once | INTRAMUSCULAR | Status: AC | PRN
  Administered 2019-08-26: 75 mL via INTRAVENOUS

## 2019-08-26 MED ORDER — SODIUM CHLORIDE (PF) 0.9 % IJ SOLN
INTRAMUSCULAR | Status: AC
  Filled 2019-08-26: qty 50

## 2019-08-28 ENCOUNTER — Other Ambulatory Visit: Payer: Self-pay

## 2019-08-28 ENCOUNTER — Encounter: Payer: Self-pay | Admitting: Internal Medicine

## 2019-08-28 ENCOUNTER — Inpatient Hospital Stay (HOSPITAL_BASED_OUTPATIENT_CLINIC_OR_DEPARTMENT_OTHER): Payer: Medicare Other | Admitting: Internal Medicine

## 2019-08-28 VITALS — BP 180/92 | HR 81 | Temp 97.8°F | Resp 20 | Ht 64.0 in | Wt 166.1 lb

## 2019-08-28 DIAGNOSIS — C3490 Malignant neoplasm of unspecified part of unspecified bronchus or lung: Secondary | ICD-10-CM | POA: Diagnosis not present

## 2019-08-28 DIAGNOSIS — C3431 Malignant neoplasm of lower lobe, right bronchus or lung: Secondary | ICD-10-CM | POA: Diagnosis not present

## 2019-08-28 DIAGNOSIS — Z902 Acquired absence of lung [part of]: Secondary | ICD-10-CM

## 2019-08-28 MED ORDER — METHYLPREDNISOLONE 4 MG PO TBPK: ORAL_TABLET | ORAL | 0 refills | Status: DC

## 2019-08-28 MED ORDER — DOXYCYCLINE HYCLATE 100 MG PO TABS: 100.0000 mg | ORAL_TABLET | Freq: Two times a day (BID) | ORAL | 0 refills | Status: DC

## 2019-08-28 NOTE — Progress Notes (Signed)
[facility] [phone]   [phone]  OFFICE PROGRESS NOTE  [name], MD [place] [address]  DIAGNOSIS: stage IIIA (T2a, N2, M0)non-small cell lung cancer, adenocarcinoma presented with right lower lobe lung mass and mediastinal lymphadenopathy. Diagnosed in May 2020.   Biomarker Findings Microsatellite status - MS-Stable Tumor Mutational Burden - 9 Muts/Mb Genomic Findings For a complete list of the genes assayed, please refer to the Appendix. NF1 K33* TP53 R158P 8 Disease relevant genes with no reportable alterations: ALK, BRAF, EGFR, ERBB2, KRAS, MET, RET, ROS1  PDL 1 expression negative  PRIOR THERAPY:   1) Status post right lower lobectomy with lymph node dissection on Jan 07, 2019 under the care of Dr. Roxan Hockey. 2) Adjuvant platinum based regimen with cisplatin 75 mg/M2 and Alimta 500 mg/M2 every 3 weeks. First dose on June 30th, 2020.  Status post 4 cycles. 3) Adjuvant radiotherapy to the mediastinum under the care of Dr. Lisbeth Renshaw.  First fraction on May 23, 2019.   CURRENT THERAPY:  Observation.  INTERVAL HISTORY: Sandy [name] 64 y.o. female returns to the clinic today for follow-up visit.  The patient is feeling fine today with no concerning complaints except for shortness of breath with exertion and mild cough productive of whitish sputum.  She denied having any chest pain or hemoptysis.  She denied having any recent weight loss or night sweats.  She has no nausea, vomiting, diarrhea or constipation.  She denied having any headache or visual changes.  She has no fever or chills.  The patient had repeat CT scan of the chest performed recently and she is here for evaluation and discussion of her scan results.  MEDICAL HISTORY: Past Medical History:  Diagnosis Date  . Adenocarcinoma of lung, stage 3, right ([facility]) 2020  . Arthritis   . Cirrhosis ([place])   . COPD (chronic obstructive  pulmonary disease) ([place])   . Dyslipidemia   . Dyspnea   . Esophageal varices ([place])   . History of blood transfusion   . History of hiatal hernia   . Hypertension   . Pneumonia   . Type 2 diabetes mellitus ([facility])    Type II    ALLERGIES:  is allergic to codeine and nsaids.  MEDICATIONS:  Current Outpatient Medications  Medication Sig Dispense Refill  . acetaminophen (TYLENOL) 500 MG tablet Take 1,000 mg by mouth every 6 (six) hours as needed for moderate pain or headache.    Marland Kitchen atorvastatin (LIPITOR) 10 MG tablet Take 10 mg by mouth at bedtime.     . diphenhydrAMINE (BENADRYL) 25 MG tablet Take 50 mg by mouth at bedtime as needed.    Marland Kitchen losartan (COZAAR) 50 MG tablet Take 1 tablet (50 mg total) by mouth daily. 30 tablet 3  . magnesium oxide (MAG-OX) 400 (241.3 Mg) MG tablet Take 2 tablets (800 mg total) by mouth 2 (two) times daily. 60 tablet 2  . metFORMIN (GLUCOPHAGE-XR) 500 MG 24 hr tablet Take 500 mg by mouth at bedtime.    . nadolol (CORGARD) 40 MG tablet Take 40 mg by mouth at bedtime.    Marland Kitchen omeprazole (PRILOSEC) 20 MG capsule Take 20 mg by mouth daily.     . prochlorperazine (COMPAZINE) 10 MG tablet Take 1 tablet (10 mg total) by mouth every 6 (six) hours as needed for nausea or vomiting. (Patient not taking: Reported on 03/26/2019) 30 tablet 0  . sucralfate (CARAFATE) 1 g tablet  Take 1 tablet (1 g total) by mouth 4 (four) times daily -  with meals and at bedtime. Crush and mix in 1 oz water 120 tablet 1  . Tetrahydrozoline HCl (VISINE OP) Place 1 drop into both eyes daily as needed (irritation).     No current facility-administered medications for this visit.    SURGICAL HISTORY:  Past Surgical History:  Procedure Laterality Date  . ABDOMINAL HYSTERECTOMY    . ANTERIOR CRUCIATE LIGAMENT REPAIR Left    x 2  . ESOPHAGOGASTRODUODENOSCOPY    . LOBECTOMY Right 01/07/2019   Procedure: RIGHT LOWER LOBE LOBECTOMY AND INTERCOSTAL NERVE BLOCK;  Surgeon: Melrose Nakayama, MD;   Location: [facility];  Service: Thoracic;  Laterality: Right;  Marland Kitchen VIDEO ASSISTED THORACOSCOPY (VATS)/WEDGE RESECTION Right 01/07/2019   Procedure: VIDEO ASSISTED THORACOSCOPY (VATS) RIGHT LOWER LOBE WEDGE RESECTION;  Surgeon: Melrose Nakayama, MD;  Location: [facility];  Service: Thoracic;  Laterality: Right;    REVIEW OF SYSTEMS:  Constitutional: positive for fatigue Eyes: negative Ears, nose, mouth, throat, and face: negative Respiratory: positive for cough and dyspnea on exertion Cardiovascular: negative Gastrointestinal: negative Genitourinary:negative Integument/breast: negative Hematologic/lymphatic: negative Musculoskeletal:negative Neurological: negative Behavioral/Psych: negative Endocrine: negative Allergic/Immunologic: negative   PHYSICAL EXAMINATION: General appearance: alert, cooperative and no distress Head: Normocephalic, without obvious abnormality, atraumatic Neck: no adenopathy, no JVD, supple, symmetrical, trachea midline and thyroid not enlarged, symmetric, no tenderness/mass/nodules Lymph nodes: Cervical, supraclavicular, and axillary nodes normal. Resp: clear to auscultation bilaterally Back: symmetric, no curvature. ROM normal. No CVA tenderness. Cardio: regular rate and rhythm, S1, S2 normal, no murmur, click, rub or gallop GI: soft, non-tender; bowel sounds normal; no masses,  no organomegaly Extremities: extremities normal, atraumatic, no cyanosis or edema Neurologic: Alert and oriented X 3, normal strength and tone. Normal symmetric reflexes. Normal coordination and gait  ECOG PERFORMANCE STATUS: 1 - Symptomatic but completely ambulatory  Blood pressure (!) 180/92, pulse 81, temperature 97.8 F (36.6 C), temperature source Oral, resp. rate 20, height 5' 4"  (1.626 m), weight 166 lb 1.6 oz (75.3 kg), SpO2 99 %.  LABORATORY DATA: Lab Results  Component Value Date   WBC 3.5 (L) 08/26/2019   HGB 12.7 08/26/2019   HCT 36.9 08/26/2019   MCV 96.3 08/26/2019    PLT 187 08/26/2019      Chemistry      Component Value Date/Time   NA 137 08/26/2019 1259   K 4.7 08/26/2019 1259   CL 98 08/26/2019 1259   CO2 22 08/26/2019 1259   BUN 11 08/26/2019 1259   CREATININE 0.79 08/26/2019 1259      Component Value Date/Time   CALCIUM 9.3 08/26/2019 1259   ALKPHOS 103 08/26/2019 1259   AST 67 (H) 08/26/2019 1259   ALT 34 08/26/2019 1259   BILITOT 1.7 (H) 08/26/2019 1259       RADIOGRAPHIC STUDIES: CT Chest W Contrast  Result Date: 08/26/2019 CLINICAL DATA:  Non-small cell lung cancer staging EXAM: CT CHEST WITH CONTRAST TECHNIQUE: Multidetector CT imaging of the chest was performed during intravenous contrast administration. CONTRAST:  45m OMNIPAQUE IOHEXOL 300 MG/ML  SOLN COMPARISON:  05/21/2019 FINDINGS: Cardiovascular: Calcified and noncalcified atherosclerotic plaque throughout the thoracic aorta. Heart size is normal. No pericardial effusion. Central pulmonary vasculature is unremarkable. Mediastinum/Nodes: No signs of adenopathy in the chest. Mild circumferential thickening of the distal esophagus similar to prior study. Esophageal varices are present. Thoracic inlet structures are unremarkable. Right paratracheal lymph node previously nearly a cm now approximately 6 mm. Lungs/Pleura:  Scattered areas of nodularity have developed in the chest since the prior study. Changes of right lower lobectomy as before. Background pulmonary emphysema worse in the right upper lobe. (Image 54, series 7) 7 mm right upper lobe pulmonary nodule not present previously. Small nodules along the anterior pleural surface in the right upper lobe (image 57, series 7 3 mm as the largest area of with associated areas of subpleural reticulation. Background septal thickening with some nodularity has developed also since the prior study. Area of developing consolidation along the minor fissure in the right chest measuring approximately 4.1 x 1.4 cm in greatest axial dimension. Small  right pleural effusion and pleural thickening. Left upper lobe nodule 7 mm (image 50, series 7 parent Airways are patent. Upper Abdomen: Signs of cirrhosis and likely associated background hepatic steatosis. Signs of portosystemic collaterals. Spleen mildly enlarged. Lesion arising from posterior left kidney measuring 1.5 cm unchanged. Cysts elsewhere in the upper poles of the bilateral kidneys are unchanged. Musculoskeletal: No sign of acute bone process. No destructive bone lesion. IMPRESSION: 1. Interval development of multiple bilateral pulmonary nodules, some of which have developed in the interval. Findings are suspicious for metastatic disease. Sequela of infection could have a similar appearance, correlate with any recent pneumonia or infection 2. Focal consolidative changes along the minor fissure in the right chest could also be related to some pneumonitis or previous infection. Peripheral location would be somewhat unusual for adjuvant mediastinal radiotherapy. Short interval follow-up may be helpful to assess this and areas of septal thickening that have developed since the prior study. 3. Triangular opacity in the right mid chest is unchanged but there is been development of surrounding septal thickening which has a mildly nodular appearance particularly along the minor fissure. 4. Small right pleural effusion and pleural thickening, this is also developed since the prior study. 5. Signs of cirrhosis and portosystemic collaterals. 6. Emphysema and aortic atherosclerosis. 7. Mild circumferential thickening of the distal esophagus is similar to prior study and may represent esophagitis. 8. Stable renal lesions. Hyperdense lesion arising from posterior left kidney compatible with hemorrhagic cyst based on previous MRI. 9. Stable splenomegaly. Aortic Atherosclerosis (ICD10-I70.0) and Emphysema (ICD10-J43.9). Electronically Signed   By: Zetta Bills M.D.   On: 08/26/2019 14:49    ASSESSMENT AND PLAN:  This is a very pleasant 64 years old white female with stage IIIA non-small cell lung cancer, adenocarcinoma with no actionable mutations status post right lower lobectomy with lymph node dissection under the care of Dr. Roxan Hockey on Jan 07, 2019. The patient completed adjuvant systemic chemotherapy with cisplatin and Alimta status post 4 cycles. This was also followed by adjuvant radiotherapy to the mediastinum completed in October 2020.  The patient is currently on observation and she is feeling fine except for shortness of breath with exertion. The patient had repeat CT scan of the chest performed recently.  I personally and independently reviewed the scan images and discussed the result and showed the images to the patient today. Her scan showed no concerning findings for disease progression but there was development of multiple bilateral pulmonary nodules concerning for inflammatory process but disease recurrence could not be completely excluded. I recommended for the patient to have repeat CT scan of the chest in 2 months for evaluation of this condition.  In the meantime I will start her on empiric treatment with doxycycline 100 mg p.o. twice daily for 2 weeks in addition to Medrol Dosepak. For hypertension I strongly recommend for the patient  to take her blood pressure medicine as prescribed and to monitor it closely at home and discuss with her primary care physician changes of her medication.  She is scheduled to see her primary care physician in few days. For the hypomagnesemia I recommended for her to resume her treatment with magnesium oxide. The patient was advised to call immediately if she has any other concerning symptoms in the interval. The patient voices understanding of current disease status and treatment options and is in agreement with the current care plan. All questions were answered. The patient knows to call the clinic with any problems, questions or concerns. We can certainly  see the patient much sooner if necessary.   Disclaimer: This note was dictated with voice recognition software. Similar sounding words can inadvertently be transcribed and may not be corrected upon review.

## 2019-09-17 ENCOUNTER — Ambulatory Visit
Admission: RE | Admit: 2019-09-17 | Discharge: 2019-09-17 | Disposition: A | Discharge status: Home / Self Care | Payer: Medicare Other | Source: Ambulatory Visit | Attending: Nurse Practitioner | Admitting: Nurse Practitioner

## 2019-09-17 ENCOUNTER — Other Ambulatory Visit: Payer: Self-pay

## 2019-09-17 DIAGNOSIS — K7469 Other cirrhosis of liver: Secondary | ICD-10-CM

## 2019-09-17 MED ORDER — GADOBENATE DIMEGLUMINE 529 MG/ML IV SOLN
16.0000 mL | Freq: Once | INTRAVENOUS | Status: AC | PRN
  Administered 2019-09-17: 13:00:00 16 mL via INTRAVENOUS

## 2019-10-03 ENCOUNTER — Telehealth: Payer: Self-pay | Admitting: Internal Medicine

## 2019-10-03 NOTE — Telephone Encounter (Signed)
Returned patient's phone call regarding rescheduling an appointment, left a voicemail. 

## 2019-10-04 ENCOUNTER — Telehealth: Payer: Self-pay | Admitting: Internal Medicine

## 2019-10-04 NOTE — Telephone Encounter (Signed)
Patient called to reschedule 03/08 appointment, per patient's request appointment has moved to 03/09.

## 2019-10-08 NOTE — Progress Notes (Signed)
  Radiation Oncology         [phone]) [phone] ________________________________  Name: Sandy [name] MRN: [id number]  Date: 07/03/2019  DOB: 1956/06/05  End of Treatment Note  Diagnosis:  Lung cancer     Indication for treatment::  curative       Radiation treatment dates:   05/23/19 - 07/03/19  Site/dose:   The patient was treated to the disease within the right lung initially to a dose of 60 Gy using a 4- field, 3-D conformal technique.    Narrative: The patient tolerated radiation treatment relatively well.   The patient did experience esophagitis during the course of treatment which required management.   Plan: The patient has completed radiation treatment. The patient will return to radiation oncology clinic for routine followup in one month. I advised the patient to call or return sooner if they have any questions or concerns related to their recovery or treatment. ________________________________  Jodelle Gross, M.D., Ph.D.

## 2019-10-25 ENCOUNTER — Encounter (HOSPITAL_COMMUNITY): Payer: Self-pay

## 2019-10-25 ENCOUNTER — Other Ambulatory Visit: Payer: Self-pay

## 2019-10-25 ENCOUNTER — Ambulatory Visit (HOSPITAL_COMMUNITY)
Admission: RE | Admit: 2019-10-25 | Discharge: 2019-10-25 | Disposition: A | Discharge status: Home / Self Care | Payer: Medicare Other | Source: Ambulatory Visit | Attending: Internal Medicine | Admitting: Internal Medicine

## 2019-10-25 ENCOUNTER — Inpatient Hospital Stay: Payer: Medicare Other | Attending: Internal Medicine

## 2019-10-25 DIAGNOSIS — N281 Cyst of kidney, acquired: Secondary | ICD-10-CM | POA: Diagnosis not present

## 2019-10-25 DIAGNOSIS — R0609 Other forms of dyspnea: Secondary | ICD-10-CM | POA: Insufficient documentation

## 2019-10-25 DIAGNOSIS — I8510 Secondary esophageal varices without bleeding: Secondary | ICD-10-CM | POA: Diagnosis not present

## 2019-10-25 DIAGNOSIS — Z7952 Long term (current) use of systemic steroids: Secondary | ICD-10-CM | POA: Insufficient documentation

## 2019-10-25 DIAGNOSIS — K7460 Unspecified cirrhosis of liver: Secondary | ICD-10-CM | POA: Diagnosis not present

## 2019-10-25 DIAGNOSIS — C3431 Malignant neoplasm of lower lobe, right bronchus or lung: Secondary | ICD-10-CM | POA: Diagnosis not present

## 2019-10-25 DIAGNOSIS — E119 Type 2 diabetes mellitus without complications: Secondary | ICD-10-CM | POA: Insufficient documentation

## 2019-10-25 DIAGNOSIS — J90 Pleural effusion, not elsewhere classified: Secondary | ICD-10-CM | POA: Insufficient documentation

## 2019-10-25 DIAGNOSIS — R05 Cough: Secondary | ICD-10-CM | POA: Insufficient documentation

## 2019-10-25 DIAGNOSIS — J439 Emphysema, unspecified: Secondary | ICD-10-CM | POA: Diagnosis not present

## 2019-10-25 DIAGNOSIS — C3490 Malignant neoplasm of unspecified part of unspecified bronchus or lung: Secondary | ICD-10-CM

## 2019-10-25 DIAGNOSIS — Z886 Allergy status to analgesic agent status: Secondary | ICD-10-CM | POA: Insufficient documentation

## 2019-10-25 DIAGNOSIS — I700 Atherosclerosis of aorta: Secondary | ICD-10-CM | POA: Diagnosis not present

## 2019-10-25 DIAGNOSIS — K760 Fatty (change of) liver, not elsewhere classified: Secondary | ICD-10-CM | POA: Diagnosis not present

## 2019-10-25 DIAGNOSIS — Z79899 Other long term (current) drug therapy: Secondary | ICD-10-CM | POA: Diagnosis not present

## 2019-10-25 DIAGNOSIS — R5383 Other fatigue: Secondary | ICD-10-CM | POA: Diagnosis not present

## 2019-10-25 DIAGNOSIS — I10 Essential (primary) hypertension: Secondary | ICD-10-CM | POA: Insufficient documentation

## 2019-10-25 DIAGNOSIS — M1990 Unspecified osteoarthritis, unspecified site: Secondary | ICD-10-CM | POA: Diagnosis not present

## 2019-10-25 DIAGNOSIS — Z885 Allergy status to narcotic agent status: Secondary | ICD-10-CM | POA: Insufficient documentation

## 2019-10-25 DIAGNOSIS — Z923 Personal history of irradiation: Secondary | ICD-10-CM | POA: Diagnosis not present

## 2019-10-25 LAB — CMP (CANCER CENTER ONLY)
ALT: 33 U/L (ref 0–44)
AST: 69 U/L — ABNORMAL HIGH (ref 15–41)
Albumin: 3.2 g/dL — ABNORMAL LOW (ref 3.5–5.0)
Alkaline Phosphatase: 119 U/L (ref 38–126)
Anion gap: 12 (ref 5–15)
BUN: 10 mg/dL (ref 8–23)
CO2: 22 mmol/L (ref 22–32)
Calcium: 8.6 mg/dL — ABNORMAL LOW (ref 8.9–10.3)
Chloride: 99 mmol/L (ref 98–111)
Creatinine: 1.51 mg/dL — ABNORMAL HIGH (ref 0.44–1.00)
GFR, Est AFR Am: 42 mL/min — ABNORMAL LOW (ref >60)
GFR, Estimated: 36 mL/min — ABNORMAL LOW (ref >60)
Glucose, Bld: 118 mg/dL — ABNORMAL HIGH (ref 70–99)
Potassium: 4.4 mmol/L (ref 3.5–5.1)
Sodium: 133 mmol/L — ABNORMAL LOW (ref 135–145)
Total Bilirubin: 0.9 mg/dL (ref 0.3–1.2)
Total Protein: 6.6 g/dL (ref 6.5–8.1)

## 2019-10-25 LAB — CBC WITH DIFFERENTIAL (CANCER CENTER ONLY)
Abs Immature Granulocytes: 0.01 10*3/uL (ref 0.00–0.07)
Basophils Absolute: 0 10*3/uL (ref 0.0–0.1)
Basophils Relative: 1 %
Eosinophils Absolute: 0.1 10*3/uL (ref 0.0–0.5)
Eosinophils Relative: 3 %
HCT: 36.4 % (ref 36.0–46.0)
Hemoglobin: 12.3 g/dL (ref 12.0–15.0)
Immature Granulocytes: 0 %
Lymphocytes Relative: 27 %
Lymphs Abs: 0.8 10*3/uL (ref 0.7–4.0)
MCH: 32.7 pg (ref 26.0–34.0)
MCHC: 33.8 g/dL (ref 30.0–36.0)
MCV: 96.8 fL (ref 80.0–100.0)
Monocytes Absolute: 0.5 10*3/uL (ref 0.1–1.0)
Monocytes Relative: 15 %
Neutro Abs: 1.7 10*3/uL (ref 1.7–7.7)
Neutrophils Relative %: 54 %
Platelet Count: 201 10*3/uL (ref 150–400)
RBC: 3.76 MIL/uL — ABNORMAL LOW (ref 3.87–5.11)
RDW: 14.6 % (ref 11.5–15.5)
WBC Count: 3.1 10*3/uL — ABNORMAL LOW (ref 4.0–10.5)
nRBC: 0 % (ref 0.0–0.2)

## 2019-10-25 MED ORDER — IOHEXOL 300 MG/ML  SOLN
60.0000 mL | Freq: Once | INTRAMUSCULAR | Status: AC | PRN
  Administered 2019-10-25: 60 mL via INTRAVENOUS

## 2019-10-25 MED ORDER — SODIUM CHLORIDE (PF) 0.9 % IJ SOLN
INTRAMUSCULAR | Status: AC
  Filled 2019-10-25: qty 50

## 2019-10-28 ENCOUNTER — Ambulatory Visit: Payer: Medicare Other | Admitting: Internal Medicine

## 2019-10-29 ENCOUNTER — Inpatient Hospital Stay (HOSPITAL_BASED_OUTPATIENT_CLINIC_OR_DEPARTMENT_OTHER): Payer: Medicare Other | Admitting: Internal Medicine

## 2019-10-29 ENCOUNTER — Encounter: Payer: Self-pay | Admitting: Internal Medicine

## 2019-10-29 ENCOUNTER — Other Ambulatory Visit: Payer: Self-pay

## 2019-10-29 VITALS — BP 124/74 | HR 99 | Temp 98.8°F | Resp 17 | Ht 64.0 in | Wt 156.0 lb

## 2019-10-29 DIAGNOSIS — C3490 Malignant neoplasm of unspecified part of unspecified bronchus or lung: Secondary | ICD-10-CM | POA: Diagnosis not present

## 2019-10-29 DIAGNOSIS — C3431 Malignant neoplasm of lower lobe, right bronchus or lung: Secondary | ICD-10-CM | POA: Diagnosis not present

## 2019-10-29 NOTE — Patient Instructions (Signed)

## 2019-10-29 NOTE — Progress Notes (Signed)
Pt provided with information for outpatient Covid-19 testing.

## 2019-10-29 NOTE — Progress Notes (Signed)
[facility] [phone]   [phone]  OFFICE PROGRESS NOTE  [name], MD [name] [address]  DIAGNOSIS: stage IIIA (T2a, N2, M0)non-small cell lung cancer, adenocarcinoma presented with right lower lobe lung mass and mediastinal lymphadenopathy. Diagnosed in May 2020.   Biomarker Findings Microsatellite status - MS-Stable Tumor Mutational Burden - 9 Muts/Mb Genomic Findings For a complete list of the genes assayed, please refer to the Appendix. NF1 K33* TP53 R158P 8 Disease relevant genes with no reportable alterations: ALK, BRAF, EGFR, ERBB2, KRAS, MET, RET, ROS1  PDL 1 expression negative  PRIOR THERAPY:   1) Status post right lower lobectomy with lymph node dissection on Jan 07, 2019 under the care of Dr. Roxan Hockey. 2) Adjuvant platinum based regimen with cisplatin 75 mg/M2 and Alimta 500 mg/M2 every 3 weeks. First dose on June 30th, 2020.  Status post 4 cycles. 3) Adjuvant radiotherapy to the mediastinum under the care of Dr. Lisbeth Renshaw.  First fraction on May 23, 2019.   CURRENT THERAPY:  Observation.  INTERVAL HISTORY: Sandy [name] 64 y.o. female returns to the clinic today for follow-up visit.  The patient continues to have shortness of breath at baseline increased with exertion with dry cough.  She was treated in the past with a course of doxycycline and steroids with no improvement in her condition.  She denied having any current chest pain or hemoptysis.  She denied having any recent weight loss or night sweats.  She has no nausea, vomiting, diarrhea or constipation.  She is here today for evaluation with repeat CT scan of the chest for restaging of her disease.  MEDICAL HISTORY: Past Medical History:  Diagnosis Date  . Adenocarcinoma of lung, stage 3, right ([facility]) 2020  . Arthritis   . Cirrhosis ([facility])   . COPD (chronic obstructive pulmonary disease) ([facility])   . Dyslipidemia   .  Dyspnea   . Esophageal varices ([facility])   . History of blood transfusion   . History of hiatal hernia   . Hypertension   . Pneumonia   . Type 2 diabetes mellitus ([facility])    Type II    ALLERGIES:  is allergic to codeine and nsaids.  MEDICATIONS:  Current Outpatient Medications  Medication Sig Dispense Refill  . levothyroxine (SYNTHROID) 25 MCG tablet     . acetaminophen (TYLENOL) 500 MG tablet Take 1,000 mg by mouth every 6 (six) hours as needed for moderate pain or headache.    Marland Kitchen atorvastatin (LIPITOR) 10 MG tablet Take 10 mg by mouth at bedtime.     . diphenhydrAMINE (BENADRYL) 25 MG tablet Take 50 mg by mouth at bedtime as needed.    . doxycycline (VIBRA-TABS) 100 MG tablet Take 1 tablet (100 mg total) by mouth 2 (two) times daily. 30 tablet 0  . losartan (COZAAR) 50 MG tablet Take 1 tablet (50 mg total) by mouth daily. 30 tablet 3  . magnesium oxide (MAG-OX) 400 (241.3 Mg) MG tablet Take 2 tablets (800 mg total) by mouth 2 (two) times daily. 60 tablet 2  . metFORMIN (GLUCOPHAGE-XR) 500 MG 24 hr tablet Take 500 mg by mouth at bedtime.    . methylPREDNISolone (MEDROL DOSEPAK) 4 MG TBPK tablet Use as instructed 21 tablet 0  . nadolol (CORGARD) 40 MG tablet Take 40 mg by mouth at bedtime.    Marland Kitchen omeprazole (PRILOSEC) 20 MG capsule Take 20 mg by mouth daily.     Marland Kitchen  prochlorperazine (COMPAZINE) 10 MG tablet Take 1 tablet (10 mg total) by mouth every 6 (six) hours as needed for nausea or vomiting. (Patient not taking: Reported on 03/26/2019) 30 tablet 0  . sucralfate (CARAFATE) 1 g tablet Take 1 tablet (1 g total) by mouth 4 (four) times daily -  with meals and at bedtime. Crush and mix in 1 oz water 120 tablet 1  . Tetrahydrozoline HCl (VISINE OP) Place 1 drop into both eyes daily as needed (irritation).     No current facility-administered medications for this visit.    SURGICAL HISTORY:  Past Surgical History:  Procedure Laterality Date  . ABDOMINAL HYSTERECTOMY    . ANTERIOR CRUCIATE  LIGAMENT REPAIR Left    x 2  . ESOPHAGOGASTRODUODENOSCOPY    . LOBECTOMY Right 01/07/2019   Procedure: RIGHT LOWER LOBE LOBECTOMY AND INTERCOSTAL NERVE BLOCK;  Surgeon: Melrose Nakayama, MD;  Location: [facility];  Service: Thoracic;  Laterality: Right;  Marland Kitchen VIDEO ASSISTED THORACOSCOPY (VATS)/WEDGE RESECTION Right 01/07/2019   Procedure: VIDEO ASSISTED THORACOSCOPY (VATS) RIGHT LOWER LOBE WEDGE RESECTION;  Surgeon: Melrose Nakayama, MD;  Location: [facility];  Service: Thoracic;  Laterality: Right;    REVIEW OF SYSTEMS:  Constitutional: positive for fatigue Eyes: negative Ears, nose, mouth, throat, and face: negative Respiratory: positive for cough and dyspnea on exertion Cardiovascular: negative Gastrointestinal: negative Genitourinary:negative Integument/breast: negative Hematologic/lymphatic: negative Musculoskeletal:negative Neurological: negative Behavioral/Psych: negative Endocrine: negative Allergic/Immunologic: negative   PHYSICAL EXAMINATION: General appearance: alert, cooperative, fatigued and no distress Head: Normocephalic, without obvious abnormality, atraumatic Neck: no adenopathy, no JVD, supple, symmetrical, trachea midline and thyroid not enlarged, symmetric, no tenderness/mass/nodules Lymph nodes: Cervical, supraclavicular, and axillary nodes normal. Resp: rales bilaterally Back: symmetric, no curvature. ROM normal. No CVA tenderness. Cardio: regular rate and rhythm, S1, S2 normal, no murmur, click, rub or gallop GI: soft, non-tender; bowel sounds normal; no masses,  no organomegaly Extremities: extremities normal, atraumatic, no cyanosis or edema Neurologic: Alert and oriented X 3, normal strength and tone. Normal symmetric reflexes. Normal coordination and gait  ECOG PERFORMANCE STATUS: 1 - Symptomatic but completely ambulatory  Blood pressure 124/74, pulse 99, temperature 98.8 F (37.1 C), temperature source Temporal, resp. rate 17, height 5' 4"  (1.626 m),  weight 156 lb (70.8 kg), SpO2 97 %.  LABORATORY DATA: Lab Results  Component Value Date   WBC 3.1 (L) 10/25/2019   HGB 12.3 10/25/2019   HCT 36.4 10/25/2019   MCV 96.8 10/25/2019   PLT 201 10/25/2019      Chemistry      Component Value Date/Time   NA 133 (L) 10/25/2019 0925   K 4.4 10/25/2019 0925   CL 99 10/25/2019 0925   CO2 22 10/25/2019 0925   BUN 10 10/25/2019 0925   CREATININE 1.51 (H) 10/25/2019 0925      Component Value Date/Time   CALCIUM 8.6 (L) 10/25/2019 0925   ALKPHOS 119 10/25/2019 0925   AST 69 (H) 10/25/2019 0925   ALT 33 10/25/2019 0925   BILITOT 0.9 10/25/2019 0925       RADIOGRAPHIC STUDIES: CT Chest W Contrast  Result Date: 10/25/2019 CLINICAL DATA:  Non-small cell lung cancer restaging post right lower lobectomy in May 2020. Subsequent adjuvant systemic chemotherapy. Radiotherapy completed October 2020. Cough for 3 days. EXAM: CT CHEST WITH CONTRAST TECHNIQUE: Multidetector CT imaging of the chest was performed during intravenous contrast administration. CONTRAST:  76m OMNIPAQUE IOHEXOL 300 MG/ML  SOLN COMPARISON:  Chest CT 08/26/2019 and 05/21/2019. Abdominal MRI 09/17/2019. FINDINGS:  Cardiovascular: Atherosclerosis of the aorta, great vessels and coronary arteries. Suboptimal opacification of the pulmonary arteries without evidence of acute pulmonary embolism or other acute vascular findings. The heart size is normal. There is no pericardial effusion. Mediastinum/Nodes: There are no enlarged mediastinal, hilar or axillary lymph nodes.The distal esophagus is mildly dilated and fluid-filled. There are small distal esophageal varices. The trachea and thyroid gland demonstrate no significant findings. Lungs/Pleura: Small dependent right pleural effusion has minimally enlarged. No left pleural effusion or pneumothorax. Background moderate centrilobular and paraseptal emphysema with interval increased ill-defined airspace opacities in the right lung, especially  posteriorly in the right upper lobe. There is also increased ill-defined airspace opacity medially in the left upper lobe. No suspicious focal nodularity. Upper abdomen: No acute findings are seen within the visualized upper abdomen. There is severe hepatic steatosis with underlying cirrhosis. No adrenal mass. Bilateral renal cysts are grossly stable. Musculoskeletal/Chest wall: There is no chest wall mass or suspicious osseous finding. IMPRESSION: 1. Interval increased ill-defined airspace opacities in the right lung and medial left upper lobe, most likely inflammatory. Findings could relate to prior radiation therapy, although if completed in October suggest possible superimposed pneumonia. Continued radiographic and/or CT follow-up recommended. 2. No focal pulmonary nodularity to suggest metastatic disease. No adenopathy. 3. Small right pleural effusion has minimally enlarged. Mildly dilated, fluid-filled distal esophagus. 4. Hepatic steatosis with underlying cirrhosis and small distal esophageal varices. 5. Aortic Atherosclerosis (ICD10-I70.0) and Emphysema (ICD10-J43.9). Electronically Signed   By: Richardean Sale M.D.   On: 10/25/2019 14:00    ASSESSMENT AND PLAN: This is a very pleasant 64 years old white female with stage IIIA non-small cell lung cancer, adenocarcinoma with no actionable mutations status post right lower lobectomy with lymph node dissection under the care of Dr. Roxan Hockey on Jan 07, 2019. The patient completed adjuvant systemic chemotherapy with cisplatin and Alimta status post 4 cycles. This was also followed by adjuvant radiotherapy to the mediastinum completed in October 2020.   She has been on observation for the last 6 months and doing fine with no concerning complaints except for the persistent shortness of breath and cough secondary to likely radiation induced pneumonitis versus pneumonia. She was treated with a course of doxycycline and Medrol Dosepak with no  improvement. She had repeat CT scan of the chest performed recently that showed the persistent inflammatory process in the right lung. I recommended for the patient to see her pulmonologist for further evaluation of her condition and recommendation regarding treatment of the inflammatory process.  I also recommended for the patient to get a Covid test to rule out any underlying Covid inflammatory process in her lung. I will arrange for the patient to have repeat CT scan of the chest in 3 months for restaging of her disease. For hypertension she will continue with her current blood pressure medications and monitor it closely at home. The patient was advised to call immediately if she has any concerning symptoms in the interval. The patient voices understanding of current disease status and treatment options and is in agreement with the current care plan. All questions were answered. The patient knows to call the clinic with any problems, questions or concerns. We can certainly see the patient much sooner if necessary.   Disclaimer: This note was dictated with voice recognition software. Similar sounding words can inadvertently be transcribed and may not be corrected upon review.

## 2019-10-30 ENCOUNTER — Telehealth: Payer: Self-pay | Admitting: Internal Medicine

## 2019-10-30 ENCOUNTER — Other Ambulatory Visit: Payer: Medicare Other

## 2019-10-30 NOTE — Telephone Encounter (Signed)
Scheduled per los. Called and left msg. Mailed printout  °

## 2019-11-06 ENCOUNTER — Encounter: Payer: Self-pay | Admitting: Internal Medicine

## 2019-11-06 ENCOUNTER — Other Ambulatory Visit: Payer: Self-pay

## 2019-11-06 ENCOUNTER — Ambulatory Visit (INDEPENDENT_AMBULATORY_CARE_PROVIDER_SITE_OTHER): Payer: Medicare Other | Admitting: Internal Medicine

## 2019-11-06 DIAGNOSIS — R0600 Dyspnea, unspecified: Secondary | ICD-10-CM | POA: Diagnosis not present

## 2019-11-06 DIAGNOSIS — J45991 Cough variant asthma: Secondary | ICD-10-CM

## 2019-11-06 DIAGNOSIS — R0609 Other forms of dyspnea: Secondary | ICD-10-CM

## 2019-11-06 DIAGNOSIS — I10 Essential (primary) hypertension: Secondary | ICD-10-CM | POA: Diagnosis not present

## 2019-11-06 MED ORDER — PANTOPRAZOLE SODIUM 40 MG PO TBEC: 40.0000 mg | DELAYED_RELEASE_TABLET | Freq: Every day | ORAL | 2 refills | Status: DC

## 2019-11-06 MED ORDER — FAMOTIDINE 20 MG PO TABS: ORAL_TABLET | ORAL | 11 refills | Status: DC

## 2019-11-06 MED ORDER — BISOPROLOL FUMARATE 5 MG PO TABS: 5.0000 mg | ORAL_TABLET | Freq: Every day | ORAL | 11 refills | Status: DC

## 2019-11-06 NOTE — Progress Notes (Signed)
Sandy [name], female    DOB: Apr 17, 1956,  MRN: [id number]   Brief patient profile:  26 yowf quit smoking 2015 for bleeding es varices which have not recurred but gained from baseline 120lb and peaked at 17 assoc with doe x fall 2019 referred to pulmonary clinic 12/03/2018 by Dr   Rosita Kea for abn LDSCT> referred to T surgery/ oncology/RT:   Oncology note 10/29/19  DIAGNOSIS:stage IIIA (T2a, N2, M0)non-small cell lung cancer, adenocarcinoma presented with right lower lobe lung mass and mediastinal lymphadenopathy. Diagnosed in May 2020.  Biomarker Findings Microsatellite status - MS-Stable Tumor Mutational Burden - 9 Muts/Mb Genomic Findings For a complete list of the genes assayed, please refer to the Appendix. NF1 K33* TP53 R158P 8 Disease relevant genes with no reportable alterations: ALK, BRAF, EGFR, ERBB2, KRAS, MET, RET, ROS1  PDL 1 expression negative  PRIOR THERAPY: 1) Status post right lower lobectomy with lymph node dissection on Jan 07, 2019 under the care of Dr. Roxan Hockey. 2) Adjuvantplatinum based regimen with cisplatin 75 mg/M2 and Alimta 500 mg/M2 every 3 weeks.First dose onJune 30th, 2020.  Status post 4 cycles. 3) Adjuvant radiotherapy to the mediastinum under the care of Dr. Lisbeth Renshaw.  First fraction on May 23, 2019.   CURRENT THERAPY: Observation.   11/06/2019   Consultation : [name] refer re cough/  Post RT changes on ct  ? pna ? > already on pred/ augmentin  Chief Complaint  Patient presents with  . Follow-up    currently being treated with prednisone and antibiotics, cough, wheezing, shortness of breath, newly started symbicort sent to office by oncologist  Dyspnea:  MB and back more difficult but now some better on symbicort despite rx with corgard  Cough: onset in November 2020 toward end of RT Worse 1st thing in am mostly day / higher doses pred no change in severity  Sleeping: p benadryl sleeps flat s resp c/o's  SABA use: has  not been  using 02: none    No obvious day to day or daytime variability or assoc excess/ purulent sputum or mucus plugs or hemoptysis or cp or chest tightness,   or overt sinus or hb symptoms.   Sleeping  without nocturnal  or early am exacerbation  of respiratory  c/o's or need for noct saba. Also denies any obvious fluctuation of symptoms with weather or environmental changes or other aggravating or alleviating factors except as outlined above   No unusual exposure hx or h/o childhood pna/ asthma or knowledge of premature birth.  Current Allergies, Complete Past Medical History, Past Surgical History, Family History, and Social History were reviewed in [employer] record.  ROS  The following are not active complaints unless bolded Hoarseness, sore throat, dysphagia, dental problems, itching, sneezing,  nasal congestion or discharge of excess mucus or purulent secretions, ear ache,   fever, chills, sweats, unintended wt loss or wt gain, classically pleuritic or exertional cp,  orthopnea pnd or arm/hand swelling  or leg swelling, presyncope, palpitations, abdominal pain, anorexia, nausea, vomiting, diarrhea  or change in bowel habits or change in bladder habits, change in stools or change in urine, dysuria, hematuria,  rash, arthralgias, visual complaints, headache, numbness, weakness or ataxia or problems with walking or coordination,  change in mood or  memory.        Current Meds  Medication Sig  . acetaminophen (TYLENOL) 500 MG tablet Take 1,000 mg by mouth every 6 (six) hours as needed for moderate pain or headache.  Marland Kitchen  amoxicillin-clavulanate (AUGMENTIN) 875-125 MG tablet Take 1 tablet by mouth 2 (two) times daily.  Marland Kitchen atorvastatin (LIPITOR) 10 MG tablet Take 10 mg by mouth at bedtime.   . benzonatate (TESSALON) 200 MG capsule Take 200 mg by mouth 3 (three) times daily as needed.  . budesonide-formoterol (SYMBICORT) 160-4.5 MCG/ACT inhaler   . diphenhydrAMINE  (BENADRYL) 25 MG tablet Take 50 mg by mouth at bedtime as needed.  Marland Kitchen levothyroxine (SYNTHROID) 25 MCG tablet   . metFORMIN (GLUCOPHAGE-XR) 500 MG 24 hr tablet Take 500 mg by mouth at bedtime.  . nadolol (CORGARD) 40 MG tablet Take 40 mg by mouth at bedtime.  Marland Kitchen omeprazole (PRILOSEC) 20 MG capsule Take 20 mg by mouth daily.   . predniSONE (DELTASONE) 20 MG tablet   . Tetrahydrozoline HCl (VISINE OP) Place 1 drop into both eyes daily as needed (irritation).  . [DISCONTINUED] doxycycline (VIBRA-TABS) 100 MG tablet Take 1 tablet (100 mg total) by mouth 2 (two) times daily.  . [DISCONTINUED] methylPREDNISolone (MEDROL DOSEPAK) 4 MG TBPK tablet Use as instructed                Past Medical History:  Diagnosis Date  . Cirrhosis ([facility])   . Esophageal varices ([facility])   . Hypertension       Objective:     amb wf dry sounding cough   Wt Readings from Last 3 Encounters:  11/06/19 158 lb 9.6 oz (71.9 kg)  10/29/19 156 lb (70.8 kg)  08/28/19 166 lb 1.6 oz (75.3 kg)     Vital signs reviewed - Note on arrival 02 sats  94% on RA  amb wf nad    HEENT : pt wearing mask not removed for exam due to covid -19 concerns.    NECK :  without JVD/Nodes/TM/ nl carotid upstrokes bilaterally   LUNGS: no acc muscle use,  Nl contour chest which is clear to A and P bilaterally with cough on deep  insp  maneuvers   CV:  RRR  no s3 or murmur or increase in P2, and no edema   ABD:  soft and nontender with nl inspiratory excursion in the supine position. No bruits or organomegaly appreciated, bowel sounds nl  MS:  Nl gait/ ext warm without deformities, calf tenderness, cyanosis or clubbing No obvious joint restrictions   SKIN: warm and dry without lesions    NEURO:  alert, approp, nl sensorium with  no motor or cerebellar deficits apparent.           I personally reviewed images and agree with radiology impression as follows:   Chest CT  10/25/19 1. Interval increased ill-defined airspace  opacities in the right lung and medial left upper lobe, most likely inflammatory. Findings could relate to prior radiation therapy, although if completed in October suggest possible superimposed pneumonia. Continued radiographic and/or CT follow-up recommended. 2. No focal pulmonary nodularity to suggest metastatic disease. No adenopathy. 3. Small right pleural effusion has minimally enlarged. Mildly dilated, fluid-filled distal esophagus.      Assessment

## 2019-11-06 NOTE — Patient Instructions (Addendum)
Stop corgad   Start bisoprolol  5mg  one daily and if blood pressure is too high twice daily   Work on inhaler technique:  relax and gently blow all the way out then take a nice smooth deep breath back in, triggering the inhaler at same time you start breathing in.  Hold for up to 5 seconds if you can. Blow symbicort out thru nose. Rinse and gargle with water when done  Pantoprazole (protonix) 40 mg   Take  30-60 min before first meal of the day and Pepcid (famotidine)  20 mg one after supper  until return to office - this is the best way to tell whether stomach acid is contributing to your problem.    GERD (REFLUX)  is an extremely common cause of respiratory symptoms just like yours , many times with no obvious heartburn at all.    It can be treated with medication, but also with lifestyle changes including elevation of the head of your bed (ideally with 6 -8inch blocks under the headboard of your bed),  Smoking cessation, avoidance of late meals, excessive alcohol, and avoid fatty foods, chocolate, peppermint, colas, red wine, and acidic juices such as orange juice.  NO MINT OR MENTHOL PRODUCTS SO NO COUGH DROPS  USE SUGARLESS CANDY INSTEAD (Jolley ranchers or Stover's or Life Savers) or even ice chips will also do - the key is to swallow to prevent all throat clearing. NO OIL BASED VITAMINS - use powdered substitutes.  Avoid fish oil when coughing.       Please schedule a follow up office visit in 4 weeks, sooner if needed  with all medications /inhalers/ solutions in hand so we can verify exactly what you are taking. This includes all medications from all doctors and over the counters

## 2019-11-06 NOTE — Assessment & Plan Note (Signed)
Onset at end of RT rx  Nov 2020 - 11/06/2019  After extensive coaching inhaler device,  effectiveness =    75% continue symb 160 2bid - try off corgard 11/06/2019 as pt reported some improvement on symbicort   The reproducibility of cough on deep insp and not on exp typical for ILD / RT effects and not asthma though she's convinced symbicort 160 did help so rec trial off corgard (see hbp) since it strongly blocks the LABA component more so than most beta blockers.   >>> f/u in 4 weeks with all meds in hand using a trust but verify approach to confirm accurate Medication  Reconciliation The principal here is that until we are certain that the  patients are doing what we've asked, it makes no sense to ask them to do more.

## 2019-11-06 NOTE — Assessment & Plan Note (Addendum)
Baseline prior to RLLobectomy 01/07/2019  PFT's done  01/04/2019  FEV1 2.23 (89 % ) ratio 0.78  p 1 % improvement from saba p ? prior to study with DLCO  10.89 (54%) corrects to 2.66  (63%)  for alv volume and FV curve nl but ERV 45%  Onset p RT November 2020 with RT changes on CT 10/25/19  - 11/06/2019   Walked RA x two laps =  approx 537ft @ moderate pace - stopped due to end of study with no sob and with sats of 93  % at the end of the study.  Symptoms are disproportionate to objective findings and not clear to what extent this is actually a pulmonary  problem but pt does appear to have difficult to sort out respiratory symptoms of unknown origin for which  DDX  = almost all start with A and  include Adherence, Ace Inhibitors, Acid Reflux, Active Sinus Disease, Alpha 1 Antitripsin deficiency, Anxiety masquerading as Airways dz,  ABPA,  Allergy(esp in young), Aspiration (esp in elderly), Adverse effects of meds,  Active smoking or Vaping, A bunch of PE's/clot burden (a few small clots can't cause this syndrome unless there is already severe underlying pulm or vascular dz with poor reserve),  Anemia or thyroid disorder, plus two Bs  = Bronchiectasis and Beta blocker use..and one C= CHF   Adherence is always the initial "prime suspect" and is a multilayered concern that requires a "trust but verify" approach in every patient - starting with knowing how to use medications, especially inhalers, correctly, keeping up with refills and understanding the fundamental difference between maintenance and prns vs those medications only taken for a very short course and then stopped and not refilled.  - return with all meds in hand using a trust but verify approach to confirm accurate Medication  Reconciliation The principal here is that until we are certain that the  patients are doing what we've asked, it makes no sense to ask them to do more.   ? Acid (or non-acid) GERD > always difficult to exclude as up to 75% of pts  in some series report no assoc GI/ Heartburn symptoms> rec max (24h)  acid suppression and diet restrictions/ reviewed and instructions given in writing.   ? Allergy/asthma > continue symbicort 160 for now but wean off prednsione as planned  ? Adverse effects of meds/ RT > latter at top of the usual list of suspects  ? Anemia Lab Results  Component Value Date   HGB 12.3 10/25/2019   HGB 12.7 08/26/2019   HGB 11.4 (L) 05/21/2019     ? BB effects > In the setting of respiratory symptoms of unknown etiology,  It would be preferable to use  bisoprolol the most selective generic choice  on the market, at least on a trial basis, to make sure the spillover Beta 2 effects of the less specific Beta blockers are not contributing to this patient's symptoms (see hbp)

## 2019-11-06 NOTE — Assessment & Plan Note (Signed)
Changed corgard to bisoprolol 11/06/2019 due to unexplained cough sob ? Asthma component   >>> recheck in 4 weeks           Each maintenance medication was reviewed in detail including emphasizing most importantly the difference between maintenance and prns and under what circumstances the prns are to be triggered using an action plan format where appropriate.  Total time for H and P, chart review, counseling, teaching device/ directly observing portions of ambulatory 02 saturation study/  and generating customized AVS unique to this office consultation / charting = 60 min

## 2019-11-16 ENCOUNTER — Emergency Department (HOSPITAL_COMMUNITY): Payer: Medicare Other

## 2019-11-16 ENCOUNTER — Encounter (HOSPITAL_COMMUNITY): Payer: Self-pay | Admitting: Emergency Medicine

## 2019-11-16 ENCOUNTER — Emergency Department (HOSPITAL_COMMUNITY)
Admission: EM | Admit: 2019-11-16 | Discharge: 2019-11-16 | Disposition: A | Discharge status: Home / Self Care | Payer: Medicare Other | Attending: Emergency Medicine | Admitting: Emergency Medicine

## 2019-11-16 ENCOUNTER — Other Ambulatory Visit: Payer: Self-pay

## 2019-11-16 DIAGNOSIS — M545 Low back pain, unspecified: Secondary | ICD-10-CM

## 2019-11-16 DIAGNOSIS — Z79899 Other long term (current) drug therapy: Secondary | ICD-10-CM | POA: Diagnosis not present

## 2019-11-16 DIAGNOSIS — M79605 Pain in left leg: Secondary | ICD-10-CM | POA: Diagnosis not present

## 2019-11-16 DIAGNOSIS — Z7984 Long term (current) use of oral hypoglycemic drugs: Secondary | ICD-10-CM | POA: Insufficient documentation

## 2019-11-16 DIAGNOSIS — Z87891 Personal history of nicotine dependence: Secondary | ICD-10-CM | POA: Diagnosis not present

## 2019-11-16 DIAGNOSIS — E119 Type 2 diabetes mellitus without complications: Secondary | ICD-10-CM | POA: Diagnosis not present

## 2019-11-16 DIAGNOSIS — R109 Unspecified abdominal pain: Secondary | ICD-10-CM | POA: Diagnosis not present

## 2019-11-16 DIAGNOSIS — Z902 Acquired absence of lung [part of]: Secondary | ICD-10-CM | POA: Insufficient documentation

## 2019-11-16 DIAGNOSIS — Z85118 Personal history of other malignant neoplasm of bronchus and lung: Secondary | ICD-10-CM | POA: Insufficient documentation

## 2019-11-16 DIAGNOSIS — J449 Chronic obstructive pulmonary disease, unspecified: Secondary | ICD-10-CM | POA: Diagnosis not present

## 2019-11-16 DIAGNOSIS — I10 Essential (primary) hypertension: Secondary | ICD-10-CM | POA: Diagnosis not present

## 2019-11-16 LAB — URINALYSIS, ROUTINE W REFLEX MICROSCOPIC
Bilirubin Urine: NEGATIVE
Glucose, UA: NEGATIVE mg/dL
Hgb urine dipstick: NEGATIVE
Ketones, ur: 5 mg/dL — AB
Leukocytes,Ua: NEGATIVE
Nitrite: NEGATIVE
Protein, ur: NEGATIVE mg/dL
Specific Gravity, Urine: 1.013 (ref 1.005–1.030)
pH: 8 (ref 5.0–8.0)

## 2019-11-16 LAB — HEPATIC FUNCTION PANEL
ALT: 50 U/L — ABNORMAL HIGH (ref 0–44)
AST: 92 U/L — ABNORMAL HIGH (ref 15–41)
Albumin: 3.1 g/dL — ABNORMAL LOW (ref 3.5–5.0)
Alkaline Phosphatase: 104 U/L (ref 38–126)
Bilirubin, Direct: 0.8 mg/dL — ABNORMAL HIGH (ref 0.0–0.2)
Indirect Bilirubin: 1.8 mg/dL — ABNORMAL HIGH (ref 0.3–0.9)
Total Bilirubin: 2.6 mg/dL — ABNORMAL HIGH (ref 0.3–1.2)
Total Protein: 6.6 g/dL (ref 6.5–8.1)

## 2019-11-16 LAB — CBC
HCT: 42.5 % (ref 36.0–46.0)
Hemoglobin: 13.7 g/dL (ref 12.0–15.0)
MCH: 32.7 pg (ref 26.0–34.0)
MCHC: 32.2 g/dL (ref 30.0–36.0)
MCV: 101.4 fL — ABNORMAL HIGH (ref 80.0–100.0)
Platelets: 202 10*3/uL (ref 150–400)
RBC: 4.19 MIL/uL (ref 3.87–5.11)
RDW: 15 % (ref 11.5–15.5)
WBC: 3.5 10*3/uL — ABNORMAL LOW (ref 4.0–10.5)
nRBC: 0 % (ref 0.0–0.2)

## 2019-11-16 LAB — BASIC METABOLIC PANEL
Anion gap: 15 (ref 5–15)
BUN: 7 mg/dL — ABNORMAL LOW (ref 8–23)
CO2: 20 mmol/L — ABNORMAL LOW (ref 22–32)
Calcium: 9.4 mg/dL (ref 8.9–10.3)
Chloride: 101 mmol/L (ref 98–111)
Creatinine, Ser: 0.92 mg/dL (ref 0.44–1.00)
GFR calc Af Amer: >60 mL/min (ref >60)
GFR calc non Af Amer: >60 mL/min (ref >60)
Glucose, Bld: 126 mg/dL — ABNORMAL HIGH (ref 70–99)
Potassium: 3.7 mmol/L (ref 3.5–5.1)
Sodium: 136 mmol/L (ref 135–145)

## 2019-11-16 LAB — LIPASE, BLOOD: Lipase: 23 U/L (ref 11–51)

## 2019-11-16 MED ORDER — LIDOCAINE 5 % EX PTCH: 1.0000 | MEDICATED_PATCH | CUTANEOUS | 0 refills | Status: DC

## 2019-11-16 MED ORDER — ONDANSETRON HCL 4 MG/2ML IJ SOLN
4.0000 mg | Freq: Once | INTRAMUSCULAR | Status: AC
  Administered 2019-11-16: 4 mg via INTRAVENOUS
  Filled 2019-11-16: qty 2

## 2019-11-16 MED ORDER — HYDROMORPHONE HCL 1 MG/ML IJ SOLN
1.0000 mg | Freq: Once | INTRAMUSCULAR | Status: AC
  Administered 2019-11-16: 1 mg via INTRAVENOUS
  Filled 2019-11-16: qty 1

## 2019-11-16 MED ORDER — ONDANSETRON 4 MG PO TBDP
4.0000 mg | ORAL_TABLET | Freq: Once | ORAL | Status: AC | PRN
  Administered 2019-11-16: 4 mg via ORAL
  Filled 2019-11-16: qty 1

## 2019-11-16 MED ORDER — PROCHLORPERAZINE MALEATE 10 MG PO TABS: 10.0000 mg | ORAL_TABLET | Freq: Two times a day (BID) | ORAL | 0 refills | Status: DC | PRN

## 2019-11-16 MED ORDER — CYCLOBENZAPRINE HCL 5 MG PO TABS: 5.0000 mg | ORAL_TABLET | Freq: Two times a day (BID) | ORAL | 0 refills | Status: DC | PRN

## 2019-11-16 MED ORDER — SODIUM CHLORIDE 0.9 % IV BOLUS
1000.0000 mL | Freq: Once | INTRAVENOUS | Status: AC
  Administered 2019-11-16: 1000 mL via INTRAVENOUS

## 2019-11-16 NOTE — ED Notes (Signed)
To CT now 

## 2019-11-16 NOTE — ED Notes (Signed)
Patient verbalizes understanding of discharge instructions. Opportunity for questioning and answers were provided. Armband removed by staff, pt discharged from ED. Wheeled out to lobby  

## 2019-11-16 NOTE — ED Triage Notes (Addendum)
Pt arrives via [facility] for c/o acute onset of lower left back pain that began suddenly around 4am, this morning, pain worse with radiation around to lower abdomen, pt denies chest pain or sob, upon ems arrival, patient was crying in pain. Pt given fentanyl 169mcg en route, unable to sit up in chair due to pain which is made better by lying on her side. EMS VSS, ekg unremarkable. Pt denies any trauma or injury, denies urinary symptoms.

## 2019-11-16 NOTE — Discharge Instructions (Signed)
Your work-up today was overall reassuring.  The CT scan showed the cysts which we discussed but otherwise not show acute cause of your symptoms.  No kidney stones or urinary tract infection was discovered.  Clinically I suspect your symptoms are more in the all the torso which is what you suspected initially.  Please use the Lidoderm patch, the muscle relaxant, and the nausea medication.  I was able to see that you received Compazine previously that worked well for you.  Please fill the prescriptions and stay hydrated.  Please rest.  Please follow-up with your primary doctor.  If any symptoms change or worsen, please return to nearest emergency department.

## 2019-11-16 NOTE — ED Provider Notes (Signed)
[facility] EMERGENCY DEPARTMENT Provider Note   CSN: [id number] Arrival date & time: 11/16/19  6160     History Chief Complaint  Patient presents with  . Back Pain    Sandy [name] is a 64 y.o. female.  The history is provided by the patient and medical records. No language interpreter was used.  Flank Pain This is a new problem. The current episode started yesterday. The problem occurs constantly. The problem has not changed since onset.Associated symptoms include abdominal pain (left flank pain). Pertinent negatives include no chest pain, no headaches and no shortness of breath. Nothing aggravates the symptoms. Nothing relieves the symptoms. She has tried nothing for the symptoms. The treatment provided no relief.       Past Medical History:  Diagnosis Date  . Adenocarcinoma of lung, stage 3, right ([place]) 2020  . Arthritis   . Cirrhosis ([place])   . COPD (chronic obstructive pulmonary disease) ([employer])   . Dyslipidemia   . Dyspnea   . Esophageal varices ([place])   . History of blood transfusion   . History of hiatal hernia   . Hypertension   . Pneumonia   . Type 2 diabetes mellitus ([place])    Type II    Patient Active Problem List   Diagnosis Date Noted  . Cough variant asthma vs RT pneumonitis  11/06/2019  . DOE (dyspnea on exertion) 11/06/2019  . Essential hypertension 11/06/2019  . Hypomagnesemia 02/26/2019  . Anemia 02/12/2019  . Encounter for antineoplastic chemotherapy 01/17/2019  . Goals of care, counseling/discussion 01/17/2019  . Malignant neoplasm of bronchus of lower lobe, right ([place]) 01/10/2019  . S/P lobectomy of lung 01/07/2019  . Type 2 diabetes mellitus ([place]) 12/18/2018  . Solitary pulmonary nodule on lung CT 12/03/2018  . Alcoholic cirrhosis ([place]) 73/71/0626  . History of esophageal varices 10/18/2017  . De Quervain's tenosynovitis, right 04/17/2015    Past Surgical History:  Procedure Laterality Date  . ABDOMINAL HYSTERECTOMY     . ANTERIOR CRUCIATE LIGAMENT REPAIR Left    x 2  . ESOPHAGOGASTRODUODENOSCOPY    . LOBECTOMY Right 01/07/2019   Procedure: RIGHT LOWER LOBE LOBECTOMY AND INTERCOSTAL NERVE BLOCK;  Surgeon: Melrose Nakayama, MD;  Location: [facility];  Service: Thoracic;  Laterality: Right;  Marland Kitchen VIDEO ASSISTED THORACOSCOPY (VATS)/WEDGE RESECTION Right 01/07/2019   Procedure: VIDEO ASSISTED THORACOSCOPY (VATS) RIGHT LOWER LOBE WEDGE RESECTION;  Surgeon: Melrose Nakayama, MD;  Location: [facility];  Service: Thoracic;  Laterality: Right;     OB History   No obstetric history on file.     Family History  Problem Relation Age of Onset  . Leukemia Mother   . Lung disease Neg Hx     Social History   Tobacco Use  . Smoking status: Former Smoker    Packs/day: 1.00    Years: 37.00    Pack years: 37.00    Quit date: 08/2014    Years since quitting: 5.2  . Smokeless tobacco: Never Used  Substance Use Topics  . Alcohol use: Yes    Alcohol/week: 1.0 standard drinks    Types: 1 Glasses of wine per week    Comment: every blue moon  . Drug use: Yes    Types: Marijuana    Home Medications Prior to Admission medications   Medication Sig Start Date End Date Taking? Authorizing Provider  acetaminophen (TYLENOL) 500 MG tablet Take 1,000 mg by mouth every 6 (six) hours as needed for moderate pain or headache.  [provider]  amoxicillin-clavulanate (AUGMENTIN) 875-125 MG tablet Take 1 tablet by mouth 2 (two) times daily. 10/29/19   [provider]  atorvastatin (LIPITOR) 10 MG tablet Take 10 mg by mouth at bedtime.     [provider]  benzonatate (TESSALON) 200 MG capsule Take 200 mg by mouth 3 (three) times daily as needed. 10/29/19   [provider]  bisoprolol (ZEBETA) 5 MG tablet Take 1 tablet (5 mg total) by mouth daily. 11/06/19   Tanda Rockers, MD  budesonide-formoterol [facility]) 160-4.5 MCG/ACT inhaler  10/29/19   [provider]  diphenhydrAMINE (BENADRYL)  25 MG tablet Take 50 mg by mouth at bedtime as needed.    [provider]  famotidine (PEPCID) 20 MG tablet One after supper 11/06/19   Tanda Rockers, MD  levothyroxine (SYNTHROID) 25 MCG tablet  09/04/19   [provider]  magnesium oxide (MAG-OX) 400 (241.3 Mg) MG tablet Take 2 tablets (800 mg total) by mouth 2 (two) times daily. Patient not taking: Reported on 11/06/2019 02/26/19   Curt Bears, MD  metFORMIN (GLUCOPHAGE-XR) 500 MG 24 hr tablet Take 500 mg by mouth at bedtime.    [provider]  pantoprazole (PROTONIX) 40 MG tablet Take 1 tablet (40 mg total) by mouth daily. Take 30-60 min before first meal of the day 11/06/19   Tanda Rockers, MD  predniSONE (DELTASONE) 20 MG tablet  10/29/19   [provider]  prochlorperazine (COMPAZINE) 10 MG tablet Take 1 tablet (10 mg total) by mouth every 6 (six) hours as needed for nausea or vomiting. Patient not taking: Reported on 03/26/2019 02/12/19   Heilingoetter, Cassandra L, PA-C  Tetrahydrozoline HCl (VISINE OP) Place 1 drop into both eyes daily as needed (irritation).    [provider]    Allergies    Codeine and Nsaids  Review of Systems   Review of Systems  Constitutional: Negative for chills, fatigue and fever.  HENT: Negative for congestion.   Eyes: Negative for visual disturbance.  Respiratory: Negative for cough, chest tightness, shortness of breath and wheezing.   Cardiovascular: Negative for chest pain, palpitations and leg swelling.  Gastrointestinal: Positive for abdominal pain (left flank pain), nausea and vomiting. Negative for constipation and diarrhea.  Genitourinary: Positive for flank pain. Negative for dysuria and frequency.  Musculoskeletal: Positive for back pain. Negative for neck pain and neck stiffness.  Skin: Negative for rash and wound.  Neurological: Negative for dizziness, light-headedness and headaches.  Psychiatric/Behavioral: Negative for agitation.    Physical  Exam Updated Vital Signs BP (!) 167/97 (BP Location: Right Arm)   Pulse 87   Temp (!) 97.4 F (36.3 C) (Oral)   Resp 18   SpO2 98%   Physical Exam Vitals and nursing note reviewed.  Constitutional:      General: She is not in acute distress.    Appearance: She is well-developed. She is not ill-appearing, toxic-appearing or diaphoretic.  HENT:     Head: Normocephalic and atraumatic.     Right Ear: External ear normal.     Left Ear: External ear normal.  Eyes:     Conjunctiva/sclera: Conjunctivae normal.     Pupils: Pupils are equal, round, and reactive to light.  Cardiovascular:     Rate and Rhythm: Normal rate.     Pulses: Normal pulses.     Heart sounds: No murmur.  Pulmonary:     Effort: Pulmonary effort is normal. No respiratory distress.  Breath sounds: No stridor. No wheezing, rhonchi or rales.  Chest:     Chest wall: No tenderness.  Abdominal:     General: Abdomen is flat. There is no distension.     Tenderness: There is no abdominal tenderness. There is left CVA tenderness. There is no right CVA tenderness, guarding or rebound.  Musculoskeletal:        General: No tenderness.     Cervical back: Normal range of motion and neck supple. No tenderness.  Skin:    General: Skin is warm.     Coloration: Skin is not pale.     Findings: No erythema or rash.  Neurological:     General: No focal deficit present.     Mental Status: She is alert and oriented to person, place, and time.     Sensory: No sensory deficit.     Motor: No weakness or abnormal muscle tone.     Deep Tendon Reflexes: Reflexes are normal and symmetric.  Psychiatric:     Comments: Crying with pain      ED Results / Procedures / Treatments   Labs (all labs ordered are listed, but only abnormal results are displayed) Labs Reviewed  URINALYSIS, ROUTINE W REFLEX MICROSCOPIC - Abnormal; Notable for the following components:      Result Value   Ketones, ur 5 (*)    All other components within  normal limits  BASIC METABOLIC PANEL - Abnormal; Notable for the following components:   CO2 20 (*)    Glucose, Bld 126 (*)    BUN 7 (*)    All other components within normal limits  CBC - Abnormal; Notable for the following components:   WBC 3.5 (*)    MCV 101.4 (*)    All other components within normal limits  HEPATIC FUNCTION PANEL - Abnormal; Notable for the following components:   Albumin 3.1 (*)    AST 92 (*)    ALT 50 (*)    Total Bilirubin 2.6 (*)    Bilirubin, Direct 0.8 (*)    Indirect Bilirubin 1.8 (*)    All other components within normal limits  URINE CULTURE  LIPASE, BLOOD    EKG None  Radiology CT Renal Stone Study  Result Date: 11/16/2019 CLINICAL DATA:  Left flank pain EXAM: CT ABDOMEN AND PELVIS WITHOUT CONTRAST TECHNIQUE: Multidetector CT imaging of the abdomen and pelvis was performed following the standard protocol without IV contrast. COMPARISON:  March 10, 2007 FINDINGS: Lower chest: Small right pleural effusion is identified. Patchy consolidation of the right lower lobe is identified. The heart size is normal. Hepatobiliary: Nodular contour of the liver is identified. Diffuse low density of within the liver is noted. No focal liver lesion is identified. The gallbladder is normal. The biliary tree is normal. Pancreas: Unremarkable. No pancreatic ductal dilatation or surrounding inflammatory changes. Spleen: Normal in size without focal abnormality. Adrenals/Urinary Tract: The adrenal glands are normal. Simple cysts are identified in the right kidney. There is no right hydronephrosis or right kidney stone. There is no left hydronephrosis or left kidney stone. There are several iso to high density lesions in the left kidney. These are nonspecific but may be due to hemorrhagic cyst. The bladder is normal. Stomach/Bowel: There is a moderate hiatal hernia. The stomach is otherwise normal. There is no small bowel obstruction. The colon is normal. The appendix is not seen  but no inflammatory changes identified around the cecum. Vascular/Lymphatic: Aortic atherosclerosis. No enlarged abdominal or pelvic lymph  nodes. Reproductive: Status post hysterectomy. No adnexal masses. Other: None. Musculoskeletal: Degenerative joint changes of the spine are noted. IMPRESSION: 1. No nephrolithiasis or hydroureteronephrosis bilaterally. 2. Several iso to high density lesions in the left kidney. These are nonspecific but may be due to hemorrhagic cysts. 3. Fatty infiltration of liver with nodular contour of liver suggesting cirrhosis. 4. Small right pleural effusion with patchy consolidation of the right lower lobe. This is nonspecific but can be seen in pneumonia. Aortic Atherosclerosis (ICD10-I70.0). Electronically Signed   By: Abelardo Diesel M.D.   On: 11/16/2019 10:40    Procedures Procedures (including critical care time)  Medications Ordered in ED Medications  ondansetron (ZOFRAN-ODT) disintegrating tablet 4 mg (4 mg Oral Given 11/16/19 0847)  HYDROmorphone (DILAUDID) injection 1 mg (1 mg Intravenous Given 11/16/19 0946)  ondansetron (ZOFRAN) injection 4 mg (4 mg Intravenous Given 11/16/19 0946)  sodium chloride 0.9 % bolus 1,000 mL (0 mLs Intravenous Stopped 11/16/19 1436)  HYDROmorphone (DILAUDID) injection 1 mg (1 mg Intravenous Given 11/16/19 1231)  ondansetron (ZOFRAN) injection 4 mg (4 mg Intravenous Given 11/16/19 1231)    ED Course  I have reviewed the triage vital signs and the nursing notes.  Pertinent labs & imaging results that were available during my care of the patient were reviewed by me and considered in my medical decision making (see chart for details).    MDM Rules/Calculators/A&P                      Sandy [name] is a 64 y.o. female with a past medical history significant for diabetes, esophageal varices with alcoholic cirrhosis, malignant neoplasm and lung status post VATS and lobectomy, COPD, dyslipidemia, hypertension, and Arthritis who presents  with left flank pain.  Patient reports over the last few days she had mild pain but then yesterday around 4 PM she had sudden onset of worsened flank pain.  She describes the pain is 15 out of 10 in her left CVA and flank area rating around towards her left side.  She reports has never had this type of pain before.  She denies any history of metastasis to her spine from her prior lung cancer and she has been off of chemotherapy for some time.  She reports when the pain began she had nausea and vomiting ever since all night.  She reports feeling dehydrated.  She reports no significant dysuria or change in urine.  She denies constipation or diarrhea.  She denies any new numbness or weakness in her legs.  She reports the pain does radiate slightly into her posterior left leg.  She reports no other abdominal pain.  No fevers, chills, cough, congestion, or URI symptoms.  She denies Covid symptoms otherwise.  She describes the pain is very sharp.  On exam, lungs are clear and chest is nontender.  Abdomen is nontender.  Bowel sounds were appreciated.  Patient did have severe tenderness in her left flank and left CVA area.  No midline tenderness on my exam.  Good pulses in lower extremities and normal strength and sensation in legs.  Patient writhing around in pain and is crying and discomfort.  Patient was given fentanyl with EMS without significant relief.  Given patient's location of discomfort description, I am concerned about kidney stone.  We will get a CT stone study as well as labs and urinalysis.  Given her history of cancer, we discussed the possibility of metastasis to the spine however given the location of  discomfort and lack of midline pain, I am more concerned about a stone in this.  We will start with CT scan and get screening labs and urinalysis.  Patient given Dilaudid and more nausea medication will give some fluids given the nausea and vomiting and her feeling dehydrated.  Anticipate reassessment  after work-up.  11:56 AM Patient CT scan surprisingly was reassuring.  There do appear to be some possible hemorrhagic cyst which I informed the patient up.  She reports she knows about them and has been worked up for them in the past.  She thinks is more in her chest and musculoskeletal wall causing the discomfort.  I tend to agree given the reassuring labs and imaging.  Still waiting on her urinalysis as pyelonephritis could be causing the discomfort.  Patient reports he does not have ovaries and thus cannot have ovarian torsion.  She reports that the pain and nausea medicine helped but is coming back.  We will give another dose of pain and nausea medicine and await the urinalysis.  If work-up is reassuring, anticipate discharge home with symptomatic relief management to follow-up with PCP.  Patient agrees with this.  Patient was feeling much better after medications.  I do suspect her symptoms are more related to her back and flank wall.  Patient would like a prescription for muscle relaxant and agrees with a Lidoderm patch as well as nausea medicine.  She would like the medicine she had last year and it was found to be Compazine.  Patient given these prescriptions and will follow up with her primary doctor.  She is understands and agrees with return precautions.  Patient no other questions or concerns and was discharged in good condition feeling much better.   Final Clinical Impression(s) / ED Diagnoses Final diagnoses:  Acute left-sided low back pain without sciatica  Flank pain    Rx / DC Orders ED Discharge Orders         Ordered    lidocaine (LIDODERM) 5 %  Every 24 hours     11/16/19 1431    cyclobenzaprine (FLEXERIL) 5 MG tablet  2 times daily PRN     11/16/19 1431    prochlorperazine (COMPAZINE) 10 MG tablet  2 times daily PRN     11/16/19 1431          Clinical Impression: 1. Acute left-sided low back pain without sciatica   2. Flank pain     Disposition: Discharge   Condition: Good  I have discussed the results, Dx and Tx plan with the pt(& family if present). He/she/they expressed understanding and agree(s) with the plan. Discharge instructions discussed at great length. Strict return precautions discussed and pt &/or family have verbalized understanding of the instructions. No further questions at time of discharge.    New Prescriptions   CYCLOBENZAPRINE (FLEXERIL) 5 MG TABLET    Take 1 tablet (5 mg total) by mouth 2 (two) times daily as needed for muscle spasms.   LIDOCAINE (LIDODERM) 5 %    Place 1 patch onto the skin daily. Remove & Discard patch within 12 hours or as directed by MD   PROCHLORPERAZINE (COMPAZINE) 10 MG TABLET    Take 1 tablet (10 mg total) by mouth 2 (two) times daily as needed for nausea or vomiting.    Follow Up: [name], MD [place] [address] [phone]     [place] [address] [id number] [place]  [place]       Tegeler, Gwenyth Allegra, MD 11/16/19 [phone]

## 2019-11-17 LAB — URINE CULTURE

## 2019-12-06 ENCOUNTER — Ambulatory Visit: Payer: Medicare Other | Admitting: Internal Medicine

## 2019-12-20 ENCOUNTER — Other Ambulatory Visit: Payer: Self-pay

## 2019-12-20 ENCOUNTER — Ambulatory Visit (INDEPENDENT_AMBULATORY_CARE_PROVIDER_SITE_OTHER): Payer: Medicare Other | Admitting: Internal Medicine

## 2019-12-20 ENCOUNTER — Encounter: Payer: Self-pay | Admitting: Internal Medicine

## 2019-12-20 DIAGNOSIS — R0600 Dyspnea, unspecified: Secondary | ICD-10-CM

## 2019-12-20 DIAGNOSIS — I10 Essential (primary) hypertension: Secondary | ICD-10-CM | POA: Diagnosis not present

## 2019-12-20 DIAGNOSIS — J45991 Cough variant asthma: Secondary | ICD-10-CM

## 2019-12-20 DIAGNOSIS — R0609 Other forms of dyspnea: Secondary | ICD-10-CM

## 2019-12-20 MED ORDER — BUDESONIDE-FORMOTEROL FUMARATE 80-4.5 MCG/ACT IN AERO: INHALATION_SPRAY | RESPIRATORY_TRACT | 11 refills | Status: DC

## 2019-12-20 MED ORDER — OMEPRAZOLE 40 MG PO CPDR: 40.0000 mg | DELAYED_RELEASE_CAPSULE | Freq: Every day | ORAL | 11 refills | Status: DC

## 2019-12-20 MED ORDER — GABAPENTIN 100 MG PO CAPS: 100.0000 mg | ORAL_CAPSULE | Freq: Three times a day (TID) | ORAL | 2 refills | Status: DC

## 2019-12-20 NOTE — Progress Notes (Signed)
Sandy [name], female    DOB: 01-23-1956,  MRN: [id number]   Brief patient profile:  57 yowf quit smoking 2015 for bleeding es varices which have not recurred but gained from baseline 120lb and peaked at 67 assoc with doe x fall 2019 referred to pulmonary clinic 12/03/2018 by Dr   Sandy [name] for abn LDSCT> referred to T surgery/ oncology/RT:   Oncology note 10/29/19  DIAGNOSIS:stage IIIA (T2a, N2, M0)non-small cell lung cancer, adenocarcinoma presented with right lower lobe lung mass and mediastinal lymphadenopathy. Diagnosed in May 2020.  Biomarker Findings Microsatellite status - MS-Stable Tumor Mutational Burden - 9 Muts/Mb Genomic Findings For a complete list of the genes assayed, please refer to the Appendix. NF1 K33* TP53 R158P 8 Disease relevant genes with no reportable alterations: ALK, BRAF, EGFR, ERBB2, KRAS, MET, RET, ROS1  PDL 1 expression negative  PRIOR THERAPY: 1) Status post right lower lobectomy with lymph node dissection on Jan 07, 2019 under the care of Dr. Roxan [name]. 2) Adjuvantplatinum based regimen with cisplatin 75 mg/M2 and Alimta 500 mg/M2 every 3 weeks.First dose onJune 30th, 2020.  Status post 4 cycles. 3) Adjuvant radiotherapy to the mediastinum under the care of Dr. Lisbeth [name].  First fraction on May 23, 2019.   CURRENT THERAPY: Observation.   11/06/2019   Consultation : Sandy [name] refer re cough/  Post RT changes on ct  ? pna ? > already on pred/ augmentin  Chief Complaint  Patient presents with  . Follow-up    currently being treated with prednisone and antibiotics, cough, wheezing, shortness of breath, newly started symbicort sent to office by oncologist  Dyspnea:  MB and back more difficult but now some better on symbicort despite rx with corgard  Cough: onset in November 2020 toward end of RT Worse 1st thing in am mostly day / higher doses pred no change in severity  Sleeping: p benadryl sleeps flat s resp c/o's  SABA use: has  not been  using 02: none  rec Stop corgad  Start bisoprolol  14m one daily and if blood pressure is too high twice daily  Work on inhaler technique:    Pantoprazole (protonix) 40 mg   Take  30-60 min before first meal of the day and Pepcid (famotidine)  20 mg one after supper  until return to office - this is the best way to tell whether stomach acid is contributing to your problem.   GERD (REFLUX)  is an extremely common cause of respiratory symptoms just like yours , many times with no obvious heartburn at all.   It can be treated with medication, but also with lifestyle changes including elevation of the head of your bed (ideally with 6 -8inch blocks under the headboard of your bed),    Please schedule a follow up office visit in 4 weeks, sooner if needed  with all medications /inhalers/ solutions in hand so we can verify exactly what you are taking. This includes all medications from all doctors and over the counters   12/20/2019  f/u ov/Sandy [name] re: cough on symb 160 2bid Chief Complaint  Patient presents with  . Follow-up  Dyspnea:  mb and back s sob / 50 ft flat Cough: mostly dry mostly during the day esp with talking /  Worse end of insp  Sleeping: sleeping in all positions flat one pillow and no benadryl  SABA use: not using / never prechallenges 02: none    No obvious day to day or daytime variability or assoc excess/  purulent sputum or mucus plugs or hemoptysis or cp or chest tightness, subjective wheeze or overt sinus or hb symptoms.   Sleeping as above  without nocturnal  or early am exacerbation  of respiratory  c/o's or need for noct saba. Also denies any obvious fluctuation of symptoms with weather or environmental changes or other aggravating or alleviating factors except as outlined above   No unusual exposure hx or h/o childhood pna/ asthma or knowledge of premature birth.  Current Allergies, Complete Past Medical History, Past Surgical History, Family History, and Social  History were reviewed in [employer] record.  ROS  The following are not active complaints unless bolded Hoarseness, sore throat, dysphagia, dental problems, itching, sneezing,  nasal congestion or discharge of excess mucus or purulent secretions, ear ache,   fever, chills, sweats, unintended wt loss or wt gain, classically pleuritic or exertional cp,  orthopnea pnd or arm/hand swelling  or leg swelling, presyncope, palpitations, abdominal pain, anorexia, nausea, vomiting, diarrhea  or change in bowel habits or change in bladder habits, change in stools or change in urine, dysuria, hematuria,  rash, arthralgias, visual complaints, headache, numbness, weakness or ataxia or problems with walking or coordination,  change in mood or  memory.        Current Meds - NOTE:   Unable to verify as accurately reflecting what pt takes     Medication Sig  . acetaminophen (TYLENOL) 500 MG tablet Take 1,000 mg by mouth every 6 (six) hours as needed for moderate pain or headache.  .     . atorvastatin (LIPITOR) 10 MG tablet Take 10 mg by mouth at bedtime.   . benzonatate (TESSALON) 200 MG capsule Makes her vomit   . bisoprolol (ZEBETA) 5 MG tablet Take 1 tablet (5 mg total) by mouth daily.  . budesonide-formoterol (SYMBICORT) 160-4.5 MCG/ACT inhaler   . cyclobenzaprine (FLEXERIL) 5 MG tablet Take 1 tablet (5 mg total) by mouth 2 (two) times daily as needed for muscle spasms.  . diphenhydrAMINE (BENADRYL) 25 MG tablet Take 50 mg by mouth at bedtime as needed.  . famotidine (PEPCID) 20 MG tablet One after supper  . levothyroxine (SYNTHROID) 25 MCG tablet   . lidocaine (LIDODERM) 5 % Place 1 patch onto the skin daily. Remove & Discard patch within 12 hours or as directed by MD  . magnesium oxide (MAG-OX) 400 (241.3 Mg) MG tablet Take 2 tablets (800 mg total) by mouth 2 (two) times daily.  . metFORMIN (GLUCOPHAGE-XR) 500 MG 24 hr tablet Take 500 mg by mouth at bedtime.  . pantoprazole  (PROTONIX) 40 MG tablet Fire in throat relieved by prilosec   .     Marland Kitchen prochlorperazine (COMPAZINE) 10 MG tablet Take 1 tablet (10 mg total) by mouth every 6 (six) hours as needed for nausea or vomiting.  . prochlorperazine (COMPAZINE) 10 MG tablet Take 1 tablet (10 mg total) by mouth 2 (two) times daily as needed for nausea or vomiting.  . Tetrahydrozoline HCl (VISINE OP) Place 1 drop into both eyes daily as needed (irritation).           Past Medical History:  Diagnosis Date  . Cirrhosis ([place])   . Esophageal varices ([place])   . Hypertension       Objective:     amb wf dry sounding cough    12/20/2019       154   11/06/19 158 lb 9.6 oz (71.9 kg)  10/29/19 156 lb (70.8 kg)  08/28/19 166 lb 1.6 oz (75.3 kg)    Vital signs reviewed  12/20/2019  - Note at rest 02 sats  95% on RA    HEENT : pt wearing mask not removed for exam due to covid -19 concerns.    NECK :  without JVD/Nodes/TM/ nl carotid upstrokes bilaterally   LUNGS: no acc muscle use,  Nl contour chest which is clear to A and P bilaterally with cough at end insp  maneuvers   CV:  RRR  no s3 or murmur or increase in P2, and no edema   ABD:  soft and nontender with nl inspiratory excursion in the supine position. No bruits or organomegaly appreciated, bowel sounds nl  MS:  Nl gait/ ext warm without deformities, calf tenderness, cyanosis or clubbing No obvious joint restrictions   SKIN: warm and dry without lesions    NEURO:  alert, approp, nl sensorium with  no motor or cerebellar deficits apparent.            Assessment

## 2019-12-20 NOTE — Patient Instructions (Addendum)
GERD (REFLUX)  is an extremely common cause of respiratory symptoms just like yours , many times with no obvious heartburn at all.    It can be treated with medication, but also with lifestyle changes including elevation of the head of your bed (ideally with 6 -8inch blocks under the headboard of your bed),  Smoking cessation, avoidance of late meals, excessive alcohol, and avoid fatty foods, chocolate, peppermint, colas, red wine, and acidic juices such as orange juice.  NO MINT OR MENTHOL PRODUCTS SO NO COUGH DROPS  USE SUGARLESS CANDY INSTEAD (Jolley ranchers or Stover's or Life Savers) or even ice chips will also do - the key is to swallow to prevent all throat clearing. NO OIL BASED VITAMINS - use powdered substitutes.  Avoid fish oil when coughing.  Increase omeprazole to 40 mg Take 30-60 min before first meal of the day   Reduce symbicort to 80 Take 2 puffs first thing in am and then another 2 puffs about 12 hours later.    Try albuterol 15 min before an activity that you know would make you short of breath and see if it makes any difference and if makes none then don't take it after activity unless you can't catch your breath.     Start gabapentin 100 mg three times daily  Please schedule a follow up office visit in 4 weeks, sooner if needed

## 2019-12-22 ENCOUNTER — Encounter: Payer: Self-pay | Admitting: Internal Medicine

## 2019-12-22 NOTE — Assessment & Plan Note (Signed)
Changed corgard to bisoprolol 11/06/2019 due to unexplained cough sob ? Asthma component > no better 12/20/2019 and airways probably not the problem so ok to change back to corgard for cost concerns

## 2019-12-22 NOTE — Assessment & Plan Note (Addendum)
Onset at end of RT rx  Nov 2020 - 11/06/2019  After extensive coaching inhaler device,  effectiveness =    75% continue symb 160 2bid - try off corgard 11/06/2019 as pt reported some improvement on symbicort > no better  - 12/20/2019  After extensive coaching inhaler device,  effectiveness =    Reduce symb 80 2bid and add gabapentin 100 mg tid   Cough at end insp is not typically an airway issue but rather likely to be be lated to RT effects on parenchyma so rec  >>> trial of gabapentin 100 tid / reduce symbicort to 80 2bid and max gerd rx

## 2019-12-22 NOTE — Assessment & Plan Note (Signed)
Baseline prior to RLLobectomy 01/07/2019  PFT's done  01/04/2019  FEV1 2.23 (89 % ) ratio 0.78  p 1 % improvement from saba p ? prior to study with DLCO  10.89 (54%) corrects to 2.66  (63%)  for alv volume and FV curve nl but ERV 45%   Onset p RT November 2020 with RT changes on CT 10/25/19  - 11/06/2019   Walked RA x two laps =  approx 537ft @ moderate pace - stopped due to end of study with no sob and with sats of 93  % at the end of the study.  rec regular ex / monitor sats  Advised: I spent extra time with pt today reviewing appropriate use of albuterol for prn use on exertion with the following points: 1) saba is for relief of sob that does not improve by walking a slower pace or resting but rather if the pt does not improve after trying this first. 2) If the pt is convinced, as many are, that saba helps recover from activity faster then it's easy to tell if this is the case by re-challenging : ie stop, take the inhaler, then p 5 minutes try the exact same activity (intensity of workload) that just caused the symptoms and see if they are substantially diminished or not after saba 3) if there is an activity that reproducibly causes the symptoms, try the saba 15 min before the activity on alternate days   If in fact the saba really does help, then fine to continue to use it prn but advised may need to look closer at the maintenance regimen being used to achieve better control of airways disease with exertion.    I had an extended discussion with the patient and reviewed all relevant studies so total time was 30 minutes with moderate level of MDM.  Each maintenance medication was reviewed in detail including most importantly the difference between maintenance and prns and under what circumstances the prns are to be triggered using an action plan format that is not reflected in the computer generated alphabetically organized AVS.     Please see AVS for specific instructions unique to this visit that  I personally wrote and verbalized to the the pt in detail and then reviewed with pt  by my nurse highlighting any  changes in therapy recommended at today's visit to their plan of care.

## 2019-12-23 ENCOUNTER — Telehealth: Payer: Self-pay | Admitting: Internal Medicine

## 2019-12-23 NOTE — Telephone Encounter (Signed)
Patient last seen 4.30.21 by Dr Melvyn Novas Per chart, Gabapentin, Omeprazole and Symbicort were all refilled at time of visit to [facility] and spoke with pharmacy tech Truman Hayward who verified receipt of all 3 medications on 4.30.21  LMOM TCB x1

## 2019-12-24 NOTE — Telephone Encounter (Signed)
LMTCB

## 2019-12-25 NOTE — Telephone Encounter (Signed)
Left message for patient to call back  

## 2019-12-25 NOTE — Telephone Encounter (Signed)
Pt got refill. Signing and closing encounter

## 2019-12-30 ENCOUNTER — Other Ambulatory Visit: Payer: Self-pay | Admitting: Thoracic Surgery (Cardiothoracic Vascular Surgery)

## 2019-12-30 DIAGNOSIS — Z902 Acquired absence of lung [part of]: Secondary | ICD-10-CM

## 2019-12-31 ENCOUNTER — Other Ambulatory Visit: Payer: Self-pay | Admitting: *Deleted

## 2019-12-31 ENCOUNTER — Ambulatory Visit (INDEPENDENT_AMBULATORY_CARE_PROVIDER_SITE_OTHER): Payer: Medicare Other | Admitting: Thoracic Surgery (Cardiothoracic Vascular Surgery)

## 2019-12-31 ENCOUNTER — Other Ambulatory Visit: Payer: Self-pay

## 2019-12-31 ENCOUNTER — Encounter: Payer: Self-pay | Admitting: Thoracic Surgery (Cardiothoracic Vascular Surgery)

## 2019-12-31 ENCOUNTER — Ambulatory Visit
Admission: RE | Admit: 2019-12-31 | Discharge: 2019-12-31 | Disposition: A | Discharge status: Home / Self Care | Payer: Medicare Other | Source: Ambulatory Visit | Attending: Thoracic Surgery (Cardiothoracic Vascular Surgery) | Admitting: Thoracic Surgery (Cardiothoracic Vascular Surgery)

## 2019-12-31 VITALS — BP 141/78 | HR 103 | Temp 97.5°F | Resp 18 | Ht 63.0 in | Wt 155.0 lb

## 2019-12-31 DIAGNOSIS — C3491 Malignant neoplasm of unspecified part of right bronchus or lung: Secondary | ICD-10-CM

## 2019-12-31 DIAGNOSIS — Z902 Acquired absence of lung [part of]: Secondary | ICD-10-CM | POA: Diagnosis not present

## 2019-12-31 DIAGNOSIS — J90 Pleural effusion, not elsewhere classified: Secondary | ICD-10-CM

## 2019-12-31 NOTE — Progress Notes (Signed)
[address]       [place] [address]             [phone]     HPI: Ms. Haydu returns for a 1 year follow-up visit  Korinne Greenstein is a 64 year old woman with a history of tobacco abuse, type 2 diabetes, cirrhosis, varices, hypertension, hyperlipidemia, and arthritis.  She had a thoracoscopic right lower lobectomy on 01/07/2019.  She turned out to have a T2,N2 stage IIIa adenocarcinoma.  She was treated with postoperative adjuvant chemo and radiation.  She is doing well from a surgical standpoint she started having coughing shortly after the completion of radiation and that has persisted ever since.  She saw Dr. Julien Nordmann in March.  A CT showed persistent inflammatory process in the right lung consistent with radiation pneumonitis versus pneumonia.  She continues to have a cough.  She notices it primarily when she is talking.  She often has spasms of coughing.  She had tried Tessalon but that caused nausea.  She is currently using throat lozenges for that.  Past Medical History:  Diagnosis Date  . Adenocarcinoma of lung, stage 3, right ([place]) 2020  . Arthritis   . Cirrhosis ([place])   . COPD (chronic obstructive pulmonary disease) ([place])   . Dyslipidemia   . Dyspnea   . Esophageal varices ([place])   . History of blood transfusion   . History of hiatal hernia   . Hypertension   . Pneumonia   . Type 2 diabetes mellitus ([facility])    Type II    Current Outpatient Medications  Medication Sig Dispense Refill  . acetaminophen (TYLENOL) 500 MG tablet Take 1,000 mg by mouth every 6 (six) hours as needed for moderate pain or headache.    Marland Kitchen atorvastatin (LIPITOR) 10 MG tablet Take 10 mg by mouth at bedtime.     . bisoprolol (ZEBETA) 5 MG tablet Take 1 tablet (5 mg total) by mouth daily. 30 tablet 11  . budesonide-formoterol (SYMBICORT) 80-4.5 MCG/ACT inhaler Take 2 puffs first thing in am and then another 2 puffs about 12 hours later. 1 Inhaler 11  . gabapentin (NEURONTIN) 100  MG capsule Take 1 capsule (100 mg total) by mouth 3 (three) times daily. One three times daily 90 capsule 2  . levothyroxine (SYNTHROID) 25 MCG tablet     . magnesium oxide (MAG-OX) 400 (241.3 Mg) MG tablet Take 2 tablets (800 mg total) by mouth 2 (two) times daily. 60 tablet 2  . metFORMIN (GLUCOPHAGE-XR) 500 MG 24 hr tablet Take 500 mg by mouth at bedtime.    Marland Kitchen omeprazole (PRILOSEC) 40 MG capsule Take 1 capsule (40 mg total) by mouth daily. 30 capsule 11  . Tetrahydrozoline HCl (VISINE OP) Place 1 drop into both eyes daily as needed (irritation).     No current facility-administered medications for this visit.    Physical Exam BP (!) 141/78 (BP Location: Right Arm, Patient Position: Sitting, Cuff Size: Normal)   Pulse (!) 103   Temp (!) 97.5 F (36.4 C)   Resp 18   Ht 5\' 3"  (1.6 m)   Wt 155 lb (70.3 kg)   SpO2 96% Comment: RA  BMI 27.67 kg/m  64 year old woman in no acute distress Coughs frequently Alert and oriented x3 with no focal deficits Diminished breath sounds on the right, no rales or wheezing Incisions well-healed No cervical or supraclavicular adenopathy  Diagnostic Tests: CHEST - 2 VIEW  COMPARISON:  March 26, 2019.  October 25, 2019.  FINDINGS: Status post right lobectomy. Mediastinal shift to the right is noted suggesting volume loss. Increased right suprahilar opacity is noted concerning for fibrosis or possibly atelectasis or pneumonia. Moderate pleural thickening or loculated effusion is seen along the right lateral chest wall and right basilar region. Left lung is clear although hyperexpanded. No pneumothorax is noted. Bony thorax is unremarkable.  IMPRESSION: Status post right lobectomy. Increased right suprahilar opacity is noted concerning for fibrosis or possibly atelectasis or pneumonia. Moderate pleural thickening or loculated effusion is seen along the right lateral chest wall and right basilar region.   Electronically Signed   By: Marijo Conception M.D.   On: 12/31/2019 13:30 I personally reviewed the chest x-ray and concur with the findings noted above  Impression: Punam Broussard is a 64 year old former smoker who was found to have a lung nodule on a low-dose screening CT.  She had a thoracoscopic right lower lobectomy a year ago.  Unfortunately at the time of surgery she was upstaged to 3A (T2, N2) adenocarcinoma.  From a surgical standpoint she is doing well.  She does not have any residual pain related to the surgery.  Her primary complaint is cough.  That has been persistent ever since her radiation.  Of concern is a new right pleural effusion that is present on her chest x-ray today.  This is loculated laterally.  It could be inflammatory, but the obvious concern is that it is malignant.  I am getting get a CT of the chest to get a better look at that and see if there are any other concerning findings.  Plan: CT chest to evaluate new right pleural effusion. Return in 1 week.  Melrose Nakayama, MD [facility] [phone]

## 2020-01-06 ENCOUNTER — Other Ambulatory Visit: Payer: Self-pay

## 2020-01-06 ENCOUNTER — Ambulatory Visit
Admission: RE | Admit: 2020-01-06 | Discharge: 2020-01-06 | Disposition: A | Discharge status: Home / Self Care | Payer: Medicare Other | Source: Ambulatory Visit | Attending: Thoracic Surgery (Cardiothoracic Vascular Surgery) | Admitting: Thoracic Surgery (Cardiothoracic Vascular Surgery)

## 2020-01-06 DIAGNOSIS — J90 Pleural effusion, not elsewhere classified: Secondary | ICD-10-CM

## 2020-01-07 ENCOUNTER — Ambulatory Visit (INDEPENDENT_AMBULATORY_CARE_PROVIDER_SITE_OTHER): Payer: Medicare Other | Admitting: Thoracic Surgery (Cardiothoracic Vascular Surgery)

## 2020-01-07 VITALS — BP 145/82 | HR 100 | Temp 97.6°F | Resp 20 | Ht 63.0 in | Wt 155.0 lb

## 2020-01-07 DIAGNOSIS — C3491 Malignant neoplasm of unspecified part of right bronchus or lung: Secondary | ICD-10-CM | POA: Diagnosis not present

## 2020-01-07 DIAGNOSIS — J90 Pleural effusion, not elsewhere classified: Secondary | ICD-10-CM

## 2020-01-07 DIAGNOSIS — Z902 Acquired absence of lung [part of]: Secondary | ICD-10-CM

## 2020-01-07 NOTE — Progress Notes (Signed)
[address]       [place], [address]             [phone]     HPI: Sandy [name] returns to discuss the results of her CT chest  Sandy [name] is a 64 year old woman with history of tobacco abuse, type 2 diabetes, cirrhosis, varices, hypertension, hyperlipidemia, and arthritis.  She had a thoracoscopic right lower lobectomy on 01/07/2019 for a T2, N2, stage IIIa adenocarcinoma.  She was treated with postoperative adjuvant chemo and radiation.  I saw her in the office last week.  She was complaining of a persistent cough that has been present since she had radiation.  She has tried multiple different cough syrups without much relief.  Her chest x-ray showed some pleural effusion I ordered a CT to get a better look at that.  She returns to discuss the results.  Past Medical History:  Diagnosis Date  . Adenocarcinoma of lung, stage 3, right ([place]) 2020  . Arthritis   . Cirrhosis ([place])   . COPD (chronic obstructive pulmonary disease) ([place])   . Dyslipidemia   . Dyspnea   . Esophageal varices ([place])   . History of blood transfusion   . History of hiatal hernia   . Hypertension   . Pneumonia   . Type 2 diabetes mellitus ([facility])    Type II    Current Outpatient Medications  Medication Sig Dispense Refill  . acetaminophen (TYLENOL) 500 MG tablet Take 1,000 mg by mouth every 6 (six) hours as needed for moderate pain or headache.    Marland Kitchen atorvastatin (LIPITOR) 10 MG tablet Take 10 mg by mouth at bedtime.     . bisoprolol (ZEBETA) 5 MG tablet Take 1 tablet (5 mg total) by mouth daily. 30 tablet 11  . budesonide-formoterol (SYMBICORT) 80-4.5 MCG/ACT inhaler Take 2 puffs first thing in am and then another 2 puffs about 12 hours later. 1 Inhaler 11  . gabapentin (NEURONTIN) 100 MG capsule Take 1 capsule (100 mg total) by mouth 3 (three) times daily. One three times daily 90 capsule 2  . levothyroxine (SYNTHROID) 25 MCG tablet     . magnesium oxide (MAG-OX) 400 (241.3 Mg) MG  tablet Take 2 tablets (800 mg total) by mouth 2 (two) times daily. 60 tablet 2  . metFORMIN (GLUCOPHAGE-XR) 500 MG 24 hr tablet Take 500 mg by mouth at bedtime.    Marland Kitchen omeprazole (PRILOSEC) 40 MG capsule Take 1 capsule (40 mg total) by mouth daily. 30 capsule 11  . Tetrahydrozoline HCl (VISINE OP) Place 1 drop into both eyes daily as needed (irritation).     No current facility-administered medications for this visit.    Physical Exam BP (!) 145/82   Pulse 100   Temp 97.6 F (36.4 C) ([place])   Resp 20   Ht 5\' 3"  (1.6 m)   Wt 155 lb (70.3 kg)   SpO2 96% Comment: RA  BMI 27.75 kg/m  64 year old woman in no acute distress Diminished breath sounds on right  Diagnostic Tests: CT CHEST WITHOUT CONTRAST  TECHNIQUE: Multidetector CT imaging of the chest was performed following the standard protocol without IV contrast.  COMPARISON:  Chest radiographs dated 12/31/2018. CT chest dated 10/25/2019.  FINDINGS: Cardiovascular: The heart is normal in size. No pericardial effusion. Rightward cardiomediastinal shift.  No evidence of thoracic aortic aneurysm. Mild atherosclerotic calcifications of the aortic arch.  Three vessel coronary atherosclerosis.  Mediastinum/Nodes: No suspicious mediastinal lymphadenopathy.  Visualized  thyroid is grossly unremarkable.  Lungs/Pleura: Status post right lower lobectomy.  Radiation changes in the right paramediastinal region with volume loss.  Small right pleural effusion, partially loculated in the right posterior perihilar region (series 2/image 64), similar to the prior although minimally progressive.  Mild centrilobular emphysematous changes, left upper lobe predominant.  No suspicious pulmonary nodules.  No focal consolidation.  No pneumothorax.  Upper Abdomen: Visualized upper abdomen is notable for severe geographic hepatic steatosis, bilateral renal cysts including a 15 mm hemorrhagic cyst in the posterior left  upper kidney (series 2/image 140), and vascular calcifications.  Musculoskeletal: Mild degenerative changes of the visualized thoracolumbar spine.  IMPRESSION: Status post right lower lobectomy with radiation changes in the right hemithorax.  Small right pleural effusion, partially loculated, minimally progressive from the prior.  No findings suspicious for recurrent or metastatic disease.  Additional ancillary findings as above.  Aortic Atherosclerosis (ICD10-I70.0) and Emphysema (ICD10-J43.9).   Electronically Signed   By: Julian Hy M.D.   On: 01/06/2020 12:52 I personally reviewed the CT images and concur with the findings noted above.  Impression: Sandy [name] is a 64 year old woman who had right lower lobectomy followed by chemoradiation for a stage IIIa adenocarcinoma about a year ago.  I saw her last week for a 1 year follow-up.  She mentioned that she been having a lot of coughing.  A chest x-ray showed some loculated pleural fluid.  I did a CT of the chest and that showed an effusion was present and partially loculated.  However its relatively small.  It may all be related to her radiation.  I do not see reason to try to tap the fluid at this time.  She is scheduled to see Dr. Julien Nordmann in about 3 weeks.  She is also scheduled to have another CT.  That would tell us if the effusion was enlarging and then if so thoracentesis would be indicated.  Plan: I will plan to see her back in about 6 months I am happy to see her sooner if any issues arise.  Melrose Nakayama, MD [facility] [phone]

## 2020-01-17 ENCOUNTER — Ambulatory Visit (INDEPENDENT_AMBULATORY_CARE_PROVIDER_SITE_OTHER): Payer: Medicare Other | Admitting: Internal Medicine

## 2020-01-17 ENCOUNTER — Other Ambulatory Visit: Payer: Self-pay

## 2020-01-17 ENCOUNTER — Encounter: Payer: Self-pay | Admitting: Internal Medicine

## 2020-01-17 DIAGNOSIS — J45991 Cough variant asthma: Secondary | ICD-10-CM | POA: Diagnosis not present

## 2020-01-17 DIAGNOSIS — I10 Essential (primary) hypertension: Secondary | ICD-10-CM

## 2020-01-17 MED ORDER — GABAPENTIN 300 MG PO CAPS: 300.0000 mg | ORAL_CAPSULE | Freq: Four times a day (QID) | ORAL | 2 refills | Status: DC

## 2020-01-17 MED ORDER — FAMOTIDINE 20 MG PO TABS: ORAL_TABLET | ORAL | 2 refills | Status: DC

## 2020-01-17 MED ORDER — PREDNISONE 10 MG PO TABS: ORAL_TABLET | ORAL | 0 refills | Status: DC

## 2020-01-17 NOTE — Assessment & Plan Note (Addendum)
Onset at end of RT rx  Nov 2020 - 11/06/2019  After extensive coaching inhaler device,  effectiveness =    75% continue symb 160 2bid - try off corgard 11/06/2019 as pt reported some improvement on symbicort > no better  - 12/20/2019  After extensive coaching inhaler device,  effectiveness =    Reduce symb 80 2bid and add gabapentin 100 mg tid  - 01/17/2020 d/c symbicort and titrate gabapentin as high as 300 mg qid   Cough to point of vomit is typical of Upper airway cough syndrome (previously labeled PNDS),  is so named because it's frequently impossible to sort out how much is  CR/sinusitis with freq throat clearing (which can be related to primary GERD)   vs  causing  secondary (" extra esophageal")  GERD from wide swings in gastric pressure that occur with throat clearing, often  promoting self use of mint and menthol lozenges that reduce the lower esophageal sphincter tone and exacerbate the problem further in a cyclical fashion.   These are the same pts (now being labeled as having "irritable larynx syndrome" by some cough centers) who not infrequently have a history of having failed to tolerate ace inhibitors,  dry powder inhalers or biphosphonates or report having atypical/extraesophageal reflux symptoms that don't respond to standard doses of PPI  and are easily confused as having aecopd or asthma flares by even experienced allergists/ pulmonologists (myself included).  Of the three most common causes of  Sub-acute / recurrent or chronic cough, only one (GERD)  can actually contribute to/ trigger  the other two (asthma and post nasal drip syndrome)  and perpetuate the cylce of cough.  While not intuitively obvious, many patients with chronic low grade reflux do not cough until there is a primary insult that disturbs the protective epithelial barrier and exposes sensitive nerve endings.   This is typically viral but can due to PNDS and  either may apply here.    >>> The point is that once this  occurs, it is difficult to eliminate the cycle  using anything but a maximally effective acid suppression regimen at least in the short run, accompanied by an appropriate diet to address non acid GERD and control / eliminate the cough itself with tramadol titrated as high as 300 mg qid and just leave off the symbicort for now.  If worse breathing rec I spent extra time with pt today reviewing appropriate use of albuterol for prn use on exertion with the following points: 1) saba is for relief of sob that does not improve by walking a slower pace or resting but rather if the pt does not improve after trying this first. 2) If the pt is convinced, as many are, that saba helps recover from activity faster then it's easy to tell if this is the case by re-challenging : ie stop, take the inhaler, then p 5 minutes try the exact same activity (intensity of workload) that just caused the symptoms and see if they are substantially diminished or not after saba 3) if there is an activity that reproducibly causes the symptoms, try the saba 15 min before the activity on alternate days   If in fact the saba really does help, then fine to continue to use it prn but advised may need to look closer at the maintenance regimen being used to achieve better control of airways disease with exertion.

## 2020-01-17 NOTE — Progress Notes (Signed)
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 Diagnosed in May 2020.  Biomarker Findings Microsatellite status - MS-Stable Tumor Mutational Burden - 9 Muts/Mb Genomic Findings For a complete list of the genes assayed, please refer to the Appendix. NF1 K33* TP53 R158P 8 Disease relevant genes with no reportable alterations: ALK, BRAF, EGFR, ERBB2, KRAS, MET, RET, ROS1  PDL 1 expression negative  PRIOR THERAPY: 1) Status post right lower lobectomy with lymph node dissection on Jan 07, 2019 under the care of Dr. Roxan Hockey. 2) Adjuvantplatinum based regimen with cisplatin 75 mg/M2 and Alimta 500 mg/M2 every 3 weeks.First dose onJune 30th, 2020.  Status post 4 cycles. 3) Adjuvant radiotherapy to the mediastinum under the care of Dr. Lisbeth Renshaw.  First fraction on May 23, 2019.   CURRENT THERAPY: Observation.   11/06/2019   Consultation : Sandy [name] refer re cough/  Post RT changes on ct  ? pna ? > already on pred/ augmentin  Chief Complaint  Patient presents with  . Follow-up    currently being treated with prednisone and antibiotics, cough, wheezing, shortness of breath, newly started symbicort sent to office by oncologist  Dyspnea:  MB and back more difficult but now some better on symbicort despite rx with corgard  Cough: onset in November 2020 toward end of RT Worse 1st thing in am mostly day / higher doses pred no change in severity  Sleeping: p benadryl sleeps flat s resp c/o's  SABA use: has  not been  using 02: none  rec Stop corgad  Start bisoprolol  15m one daily and if blood pressure is too high twice daily  Work on inhaler technique:    Pantoprazole (protonix) 40 mg   Take  30-60 min before first meal of the day and Pepcid (famotidine)  20 mg one after supper  until return to office - this is the best way to tell whether stomach acid is contributing to your problem.   GERD (REFLUX)  is an extremely common cause of respiratory symptoms just like yours , many times with no obvious heartburn at all.   It can be treated with medication, but also with lifestyle changes including elevation of the head of your bed (ideally with 6 -8inch blocks under the headboard of your bed),    Please schedule a follow up office visit in 4 weeks, sooner if needed  with all medications /inhalers/ solutions in hand so we can verify exactly what you are taking. This includes all medications from all doctors and over the counters   12/20/2019  f/u ov/Sandy [name] re: cough on symb 160 2bid Chief Complaint  Patient presents with  . Follow-up  Dyspnea:  mb and back s sob / 50 ft flat Cough: mostly dry mostly during the day esp with talking /  Worse end of insp  Sleeping: sleeping in all positions flat one pillow and no benadryl  SABA use: not using / never prechallenges 02: none  rec GERD diet  Increase omeprazole to 40 mg Take 30-60 min before  first meal of the day  Reduce symbicort to 80 Take 2 puffs first thing in am and then another 2 puffs about 12 hours later.  Try albuterol 15 min before an activity that you know would make you short of breath and see if it makes any difference and if makes none then don't take it after activity unless you can't catch your breath. Start gabapentin 100 mg three times daily      01/17/2020  f/u ov/Sandy [name] re: cough since nov 2020 on symbicort 80  Chief Complaint  Patient presents with  . Follow-up    pt has a chronic cough.pt states she doesn't use rescue inhaler    Dyspnea:  More difficult to get to mb back  Cough: dry/ pectin helps most Sleeping: usually able to lie flat/ one pillow/no noct cough  SABA use: no use  02: none  Cough to point of gag occ vomit / urinary incont    No obvious day to day or daytime variability or assoc excess/ purulent sputum or mucus plugs or hemoptysis or cp or chest tightness, subjective wheeze or overt sinus or hb symptoms.   Sleeping  without nocturnal  or early am exacerbation  of respiratory  c/o's or need for noct saba. Also denies any obvious fluctuation of symptoms with weather or environmental changes or other aggravating or alleviating factors except as outlined above   No unusual exposure hx or h/o childhood pna/ asthma or knowledge of premature birth.  Current Allergies, Complete Past Medical History, Past Surgical History, Family History, and Social History were reviewed in [employer] record.  ROS  The following are not active complaints unless bolded Hoarseness, sore throat, dysphagia, dental problems, itching, sneezing,  nasal congestion or discharge of excess mucus or purulent secretions, ear ache,   fever, chills, sweats, unintended wt loss or wt gain, classically pleuritic or exertional cp,  orthopnea pnd or arm/hand swelling  or leg swelling, presyncope, palpitations, abdominal pain, anorexia, nausea, vomiting, diarrhea  or change in bowel habits or change in bladder habits, change in stools or change in urine, dysuria, hematuria,  rash, arthralgias, visual complaints, headache, numbness, weakness or ataxia or problems with walking or coordination,  change in mood or  memory.        Current Meds  Medication Sig  . acetaminophen (TYLENOL) 500 MG tablet Take 1,000 mg by mouth every 6 (six) hours as needed for moderate pain or headache.  Marland Kitchen atorvastatin (LIPITOR) 10 MG tablet Take 10 mg by mouth at bedtime.   Marland Kitchen levothyroxine (SYNTHROID) 25 MCG tablet   . magnesium oxide (MAG-OX) 400  (241.3 Mg) MG tablet Take 2 tablets (800 mg total) by mouth 2 (two) times daily.  . metFORMIN (GLUCOPHAGE-XR) 500 MG 24 hr tablet Take 500 mg by mouth at bedtime.  Marland Kitchen omeprazole (PRILOSEC) 40 MG capsule Take 1 capsule (40 mg total) by mouth daily.  . Tetrahydrozoline HCl (VISINE OP) Place 1 drop into both eyes daily as needed (irritation).  . [DISCONTINUED] budesonide-formoterol (SYMBICORT) 80-4.5 MCG/ACT inhaler Take 2 puffs first thing in am and then another 2 puffs about 12 hours later.  . [DISCONTINUED] gabapentin (NEURONTIN) 100 MG capsule Take 1 capsule (100 mg total) by mouth 3 (three) times daily. One three times daily             Past Medical History:  Diagnosis Date  . Cirrhosis ([place])   . Esophageal varices ([place])   . Hypertension  Objective:     amb wf non-stop dry cough   01/17/2020        154  12/20/2019       154   11/06/19 158 lb 9.6 oz (71.9 kg)  10/29/19 156 lb (70.8 kg)  08/28/19 166 lb 1.6 oz (75.3 kg)     Vital signs reviewed  01/17/2020  - Note at rest 02 sats  93% on RA     HEENT : pt wearing mask not removed for exam due to covid -19 concerns.    NECK :  without JVD/Nodes/TM/ nl carotid upstrokes bilaterally   LUNGS: no acc muscle use,  Nl contour chest which is clear to A and P bilaterally with  cough immediately on insp  maneuvers   CV:  RRR  no s3 or murmur or increase in P2, and no edema   ABD:  soft and nontender with nl inspiratory excursion in the supine position. No bruits or organomegaly appreciated, bowel sounds nl  MS:  Nl gait/ ext warm without deformities, calf tenderness, cyanosis or clubbing No obvious joint restrictions   SKIN: warm and dry without lesions    NEURO:  alert, approp, nl sensorium with  no motor or cerebellar deficits apparent.     I personally reviewed images and agree with radiology impression as follows:   Chest CT 01/06/20  Status post right lower lobectomy with radiation changes in the right  hemithorax. Small right pleural effusion, partially loculated, minimally progressive from the prior. My impression: effusion is tiny         Assessment

## 2020-01-17 NOTE — Patient Instructions (Addendum)
Try off symbicort   Prednisone 10 mg Take 4 for three days 3 for three days 2 for three days 1 for three days and stop   Try albuterol 15 min before an activity that you know would make you short of breath and see if it makes any difference and if makes none then don't take it after activity unless you can't catch your breath.  Gabapentin 300 mg four times daily is the maximum dose   Pepcid 20  Mg after supper   Delsym 2 tsp every 12 hours is the strongest cough medication you can buy without presription  Please schedule a follow up office visit in 8 weeks, call sooner if needed

## 2020-01-17 NOTE — Assessment & Plan Note (Addendum)
Changed corgard to bisoprolol 11/06/2019 due to unexplained cough sob ? Asthma component  No chaneg on bisoprolol vs prior meds > resume prior meds          Each maintenance medication was reviewed in detail including emphasizing most importantly the difference between maintenance and prns and under what circumstances the prns are to be triggered using an action plan format where appropriate.  Total time for H and P, chart review, counseling,   and generating customized AVS unique to this office visit / charting = 20 min

## 2020-01-27 ENCOUNTER — Telehealth: Payer: Self-pay | Admitting: Medical Oncology

## 2020-01-27 ENCOUNTER — Inpatient Hospital Stay: Payer: Medicare Other | Attending: Internal Medicine

## 2020-01-27 ENCOUNTER — Other Ambulatory Visit: Payer: Self-pay

## 2020-01-27 DIAGNOSIS — K760 Fatty (change of) liver, not elsewhere classified: Secondary | ICD-10-CM | POA: Diagnosis not present

## 2020-01-27 DIAGNOSIS — R9389 Abnormal findings on diagnostic imaging of other specified body structures: Secondary | ICD-10-CM | POA: Insufficient documentation

## 2020-01-27 DIAGNOSIS — M1990 Unspecified osteoarthritis, unspecified site: Secondary | ICD-10-CM | POA: Insufficient documentation

## 2020-01-27 DIAGNOSIS — I10 Essential (primary) hypertension: Secondary | ICD-10-CM | POA: Insufficient documentation

## 2020-01-27 DIAGNOSIS — G35 Multiple sclerosis: Secondary | ICD-10-CM | POA: Insufficient documentation

## 2020-01-27 DIAGNOSIS — Z885 Allergy status to narcotic agent status: Secondary | ICD-10-CM | POA: Insufficient documentation

## 2020-01-27 DIAGNOSIS — R0602 Shortness of breath: Secondary | ICD-10-CM | POA: Insufficient documentation

## 2020-01-27 DIAGNOSIS — R590 Localized enlarged lymph nodes: Secondary | ICD-10-CM | POA: Diagnosis not present

## 2020-01-27 DIAGNOSIS — Z7984 Long term (current) use of oral hypoglycemic drugs: Secondary | ICD-10-CM | POA: Insufficient documentation

## 2020-01-27 DIAGNOSIS — Z886 Allergy status to analgesic agent status: Secondary | ICD-10-CM | POA: Diagnosis not present

## 2020-01-27 DIAGNOSIS — E119 Type 2 diabetes mellitus without complications: Secondary | ICD-10-CM | POA: Insufficient documentation

## 2020-01-27 DIAGNOSIS — J90 Pleural effusion, not elsewhere classified: Secondary | ICD-10-CM | POA: Diagnosis not present

## 2020-01-27 DIAGNOSIS — N281 Cyst of kidney, acquired: Secondary | ICD-10-CM | POA: Insufficient documentation

## 2020-01-27 DIAGNOSIS — Z79899 Other long term (current) drug therapy: Secondary | ICD-10-CM | POA: Insufficient documentation

## 2020-01-27 DIAGNOSIS — Z7952 Long term (current) use of systemic steroids: Secondary | ICD-10-CM | POA: Diagnosis not present

## 2020-01-27 DIAGNOSIS — R05 Cough: Secondary | ICD-10-CM | POA: Diagnosis not present

## 2020-01-27 DIAGNOSIS — K7689 Other specified diseases of liver: Secondary | ICD-10-CM | POA: Diagnosis not present

## 2020-01-27 DIAGNOSIS — C3431 Malignant neoplasm of lower lobe, right bronchus or lung: Secondary | ICD-10-CM

## 2020-01-27 LAB — CBC WITH DIFFERENTIAL (CANCER CENTER ONLY)
Abs Immature Granulocytes: 0.15 10*3/uL — ABNORMAL HIGH (ref 0.00–0.07)
Basophils Absolute: 0 10*3/uL (ref 0.0–0.1)
Basophils Relative: 0 %
Eosinophils Absolute: 0.1 10*3/uL (ref 0.0–0.5)
Eosinophils Relative: 1 %
HCT: 36.4 % (ref 36.0–46.0)
Hemoglobin: 11.6 g/dL — ABNORMAL LOW (ref 12.0–15.0)
Immature Granulocytes: 3 %
Lymphocytes Relative: 11 %
Lymphs Abs: 0.7 10*3/uL (ref 0.7–4.0)
MCH: 32.9 pg (ref 26.0–34.0)
MCHC: 31.9 g/dL (ref 30.0–36.0)
MCV: 103.1 fL — ABNORMAL HIGH (ref 80.0–100.0)
Monocytes Absolute: 0.6 10*3/uL (ref 0.1–1.0)
Monocytes Relative: 11 %
Neutro Abs: 4.2 10*3/uL (ref 1.7–7.7)
Neutrophils Relative %: 74 %
Platelet Count: 246 10*3/uL (ref 150–400)
RBC: 3.53 MIL/uL — ABNORMAL LOW (ref 3.87–5.11)
RDW: 18.1 % — ABNORMAL HIGH (ref 11.5–15.5)
WBC Count: 5.7 10*3/uL (ref 4.0–10.5)
nRBC: 0.3 % — ABNORMAL HIGH (ref 0.0–0.2)

## 2020-01-27 LAB — CMP (CANCER CENTER ONLY)
ALT: 158 U/L — ABNORMAL HIGH (ref 0–44)
AST: 228 U/L (ref 15–41)
Albumin: 2.7 g/dL — ABNORMAL LOW (ref 3.5–5.0)
Alkaline Phosphatase: 164 U/L — ABNORMAL HIGH (ref 38–126)
Anion gap: 13 (ref 5–15)
BUN: 11 mg/dL (ref 8–23)
CO2: 25 mmol/L (ref 22–32)
Calcium: 9 mg/dL (ref 8.9–10.3)
Chloride: 100 mmol/L (ref 98–111)
Creatinine: 1.02 mg/dL — ABNORMAL HIGH (ref 0.44–1.00)
GFR, Est AFR Am: >60 mL/min (ref >60)
GFR, Estimated: 58 mL/min — ABNORMAL LOW (ref >60)
Glucose, Bld: 100 mg/dL — ABNORMAL HIGH (ref 70–99)
Potassium: 4.7 mmol/L (ref 3.5–5.1)
Sodium: 138 mmol/L (ref 135–145)
Total Bilirubin: 2.6 mg/dL — ABNORMAL HIGH (ref 0.3–1.2)
Total Protein: 6.3 g/dL — ABNORMAL LOW (ref 6.5–8.1)

## 2020-01-27 NOTE — Telephone Encounter (Signed)
Per Dr Julien Nordmann " Please let the patient know that her liver enzymes are elevated. She may need to check with her primary care physician to see if she is on any medications that can cause her liver enzymes to get worse. " LVM to return my call tomorrow.

## 2020-01-29 ENCOUNTER — Telehealth: Payer: Self-pay | Admitting: Internal Medicine

## 2020-01-29 ENCOUNTER — Inpatient Hospital Stay (HOSPITAL_BASED_OUTPATIENT_CLINIC_OR_DEPARTMENT_OTHER): Payer: Medicare Other | Admitting: Internal Medicine

## 2020-01-29 ENCOUNTER — Encounter: Payer: Self-pay | Admitting: Internal Medicine

## 2020-01-29 ENCOUNTER — Other Ambulatory Visit: Payer: Self-pay

## 2020-01-29 VITALS — BP 130/80 | HR 114 | Temp 97.5°F | Resp 20 | Ht 63.0 in | Wt 155.3 lb

## 2020-01-29 DIAGNOSIS — I10 Essential (primary) hypertension: Secondary | ICD-10-CM

## 2020-01-29 DIAGNOSIS — C3431 Malignant neoplasm of lower lobe, right bronchus or lung: Secondary | ICD-10-CM | POA: Diagnosis not present

## 2020-01-29 DIAGNOSIS — C3490 Malignant neoplasm of unspecified part of unspecified bronchus or lung: Secondary | ICD-10-CM

## 2020-01-29 NOTE — Telephone Encounter (Signed)
rec stop and see what happens with cough and LFTs and can rechallenge later if needed

## 2020-01-29 NOTE — Telephone Encounter (Signed)
Called pt and advised message from the provider. Pt understood and verbalized understanding. Nothing further is needed.    

## 2020-01-29 NOTE — Telephone Encounter (Signed)
Spoke with pt, she states the oncologists suggested she get off Gabapentin. He stated the liver enzymes increased after she started taking the medication.  MW please review lab results/liver enzymes and advise if pt should stop taking Gabapentin.

## 2020-01-29 NOTE — Progress Notes (Signed)
[facility] [phone]   Fax:([phone]  OFFICE PROGRESS NOTE  [name], MD [place] [address]  DIAGNOSIS: stage IIIA (T2a, N2, M0)non-small cell lung cancer, adenocarcinoma presented with right lower lobe lung mass and mediastinal lymphadenopathy. Diagnosed in May 2020.   Biomarker Findings Microsatellite status - MS-Stable Tumor Mutational Burden - 9 Muts/Mb Genomic Findings For a complete list of the genes assayed, please refer to the Appendix. NF1 K33* TP53 R158P 8 Disease relevant genes with no reportable alterations: ALK, BRAF, EGFR, ERBB2, KRAS, MET, RET, ROS1  PDL 1 expression negative  PRIOR THERAPY:   1) Status post right lower lobectomy with lymph node dissection on Jan 07, 2019 under the care of Dr. Roxan Hockey. 2) Adjuvant platinum based regimen with cisplatin 75 mg/M2 and Alimta 500 mg/M2 every 3 weeks. First dose on June 30th, 2020.  Status post 4 cycles. 3) Adjuvant radiotherapy to the mediastinum under the care of Dr. Lisbeth Renshaw.  First fraction on May 23, 2019.   CURRENT THERAPY:  Observation.  INTERVAL HISTORY: Sandy [name] 64 y.o. female returns to the clinic today for 56-monthfollow-up visit.  The patient is feeling fine today with no concerning complaints except for the shortness of breath and cough.  She was seen recently by Dr. WShyrl Numbersand started on treatment with prednisone as well as gabapentin.  She denied having any chest pain or hemoptysis.  She denied having any nausea, vomiting, diarrhea or constipation.  She denied having any headache or visual changes.  She has no fever or chills.  She is here today for evaluation and repeat blood work as well as CT scan of the chest.  MEDICAL HISTORY: Past Medical History:  Diagnosis Date  . Adenocarcinoma of lung, stage 3, right ([facility] 2020  . Arthritis   . Cirrhosis ([facility]   . COPD (chronic obstructive pulmonary disease) ([facility]   .  Dyslipidemia   . Dyspnea   . Esophageal varices ([facility]   . History of blood transfusion   . History of hiatal hernia   . Hypertension   . Pneumonia   . Type 2 diabetes mellitus ([facility])    Type II    ALLERGIES:  is allergic to codeine and nsaids.  MEDICATIONS:  Current Outpatient Medications  Medication Sig Dispense Refill  . atorvastatin (LIPITOR) 10 MG tablet Take 10 mg by mouth at bedtime.     . famotidine (PEPCID) 20 MG tablet One after supper 30 tablet 2  . gabapentin (NEURONTIN) 300 MG capsule Take 1 capsule (300 mg total) by mouth 4 (four) times daily. 120 capsule 2  . levothyroxine (SYNTHROID) 25 MCG tablet     . magnesium oxide (MAG-OX) 400 (241.3 Mg) MG tablet Take 2 tablets (800 mg total) by mouth 2 (two) times daily. 60 tablet 2  . metFORMIN (GLUCOPHAGE-XR) 500 MG 24 hr tablet Take 500 mg by mouth at bedtime.    .Marland Kitchenomeprazole (PRILOSEC) 40 MG capsule Take 1 capsule (40 mg total) by mouth daily. 30 capsule 11  . predniSONE (DELTASONE) 10 MG tablet Take 4 for three days 3 for three days 2 for three days 1 for three days and stop 30 tablet 0  . Tetrahydrozoline HCl (VISINE OP) Place 1 drop into both eyes daily as needed (irritation).    .Marland Kitchenacetaminophen (TYLENOL) 500 MG tablet Take 1,000 mg by mouth every 6 (six) hours as needed for moderate pain or headache.  No current facility-administered medications for this visit.    SURGICAL HISTORY:  Past Surgical History:  Procedure Laterality Date  . ABDOMINAL HYSTERECTOMY    . ANTERIOR CRUCIATE LIGAMENT REPAIR Left    x 2  . ESOPHAGOGASTRODUODENOSCOPY    . LOBECTOMY Right 01/07/2019   Procedure: RIGHT LOWER LOBE LOBECTOMY AND INTERCOSTAL NERVE BLOCK;  Surgeon: Melrose Nakayama, MD;  Location: [facility];  Service: Thoracic;  Laterality: Right;  Marland Kitchen VIDEO ASSISTED THORACOSCOPY (VATS)/WEDGE RESECTION Right 01/07/2019   Procedure: VIDEO ASSISTED THORACOSCOPY (VATS) RIGHT LOWER LOBE WEDGE RESECTION;  Surgeon: Melrose Nakayama,  MD;  Location: [facility];  Service: Thoracic;  Laterality: Right;    REVIEW OF SYSTEMS:  A comprehensive review of systems was negative except for: Respiratory: positive for cough and dyspnea on exertion   PHYSICAL EXAMINATION: General appearance: alert, cooperative, fatigued and no distress Head: Normocephalic, without obvious abnormality, atraumatic Neck: no adenopathy, no JVD, supple, symmetrical, trachea midline and thyroid not enlarged, symmetric, no tenderness/mass/nodules Lymph nodes: Cervical, supraclavicular, and axillary nodes normal. Resp: clear to auscultation bilaterally Back: symmetric, no curvature. ROM normal. No CVA tenderness. Cardio: regular rate and rhythm, S1, S2 normal, no murmur, click, rub or gallop GI: soft, non-tender; bowel sounds normal; no masses,  no organomegaly Extremities: extremities normal, atraumatic, no cyanosis or edema  ECOG PERFORMANCE STATUS: 1 - Symptomatic but completely ambulatory  Blood pressure 130/80, pulse (!) 114, temperature (!) 97.5 F (36.4 C), temperature source Temporal, resp. rate 20, height _0  (1.6 m), weight 155 lb 4.8 oz (70.4 kg), SpO2 98 %.  LABORATORY DATA: Lab Results  Component Value Date   WBC 5.7 01/27/2020   HGB 11.6 (L) 01/27/2020   HCT 36.4 01/27/2020   MCV 103.1 (H) 01/27/2020   PLT 246 01/27/2020      Chemistry      Component Value Date/Time   NA 138 01/27/2020 1011   K 4.7 01/27/2020 1011   CL 100 01/27/2020 1011   CO2 25 01/27/2020 1011   BUN 11 01/27/2020 1011   CREATININE 1.02 (H) 01/27/2020 1011      Component Value Date/Time   CALCIUM 9.0 01/27/2020 1011   ALKPHOS 164 (H) 01/27/2020 1011   AST 228 (HH) 01/27/2020 1011   ALT 158 (H) 01/27/2020 1011   BILITOT 2.6 (H) 01/27/2020 1011       RADIOGRAPHIC STUDIES: DG Chest 2 View  Result Date: 12/31/2019 CLINICAL DATA:  Status post lobectomy. EXAM: CHEST - 2 VIEW COMPARISON:  March 26, 2019. October 25, 2019. FINDINGS: Status post right lobectomy.  Mediastinal shift to the right is noted suggesting volume loss. Increased right suprahilar opacity is noted concerning for fibrosis or possibly atelectasis or pneumonia. Moderate pleural thickening or loculated effusion is seen along the right lateral chest wall and right basilar region. Left lung is clear although hyperexpanded. No pneumothorax is noted. Bony thorax is unremarkable. IMPRESSION: Status post right lobectomy. Increased right suprahilar opacity is noted concerning for fibrosis or possibly atelectasis or pneumonia. Moderate pleural thickening or loculated effusion is seen along the right lateral chest wall and right basilar region. Electronically Signed   By: Marijo Conception M.D.   On: 12/31/2019 13:30   CT Chest Wo Contrast  Result Date: 01/06/2020 CLINICAL DATA:  Abnormal radiograph, pleural effusion. Right lung cancer, status post right lower lobectomy, with chemotherapy and radiation. Chronic cough. EXAM: CT CHEST WITHOUT CONTRAST TECHNIQUE: Multidetector CT imaging of the chest was performed following the standard protocol without IV contrast.  COMPARISON:  Chest radiographs dated 12/31/2018. CT chest dated 10/25/2019. FINDINGS: Cardiovascular: The heart is normal in size. No pericardial effusion. Rightward cardiomediastinal shift. No evidence of thoracic aortic aneurysm. Mild atherosclerotic calcifications of the aortic arch. Three vessel coronary atherosclerosis. Mediastinum/Nodes: No suspicious mediastinal lymphadenopathy. Visualized thyroid is grossly unremarkable. Lungs/Pleura: Status post right lower lobectomy. Radiation changes in the right paramediastinal region with volume loss. Small right pleural effusion, partially loculated in the right posterior perihilar region (series 2/image 64), similar to the prior although minimally progressive. Mild centrilobular emphysematous changes, left upper lobe predominant. No suspicious pulmonary nodules.  No focal consolidation. No pneumothorax.  Upper Abdomen: Visualized upper abdomen is notable for severe geographic hepatic steatosis, bilateral renal cysts including a 15 mm hemorrhagic cyst in the posterior left upper kidney (series 2/image 140), and vascular calcifications. Musculoskeletal: Mild degenerative changes of the visualized thoracolumbar spine. IMPRESSION: Status post right lower lobectomy with radiation changes in the right hemithorax. Small right pleural effusion, partially loculated, minimally progressive from the prior. No findings suspicious for recurrent or metastatic disease. Additional ancillary findings as above. Aortic Atherosclerosis (ICD10-I70.0) and Emphysema (ICD10-J43.9). Electronically Signed   By: Julian Hy M.D.   On: 01/06/2020 12:52    ASSESSMENT AND PLAN: This is a very pleasant 64 years old white female with stage IIIA non-small cell lung cancer, adenocarcinoma with no actionable mutations status post right lower lobectomy with lymph node dissection under the care of Dr. Roxan Hockey on Jan 07, 2019. The patient completed adjuvant systemic chemotherapy with cisplatin and Alimta status post 4 cycles. This was also followed by adjuvant radiotherapy to the mediastinum completed in October 2020.   The patient is currently on observation and she is feeling fine today with no concerning complaints except for mild shortness of breath and cough.  She is treated with prednisone and gabapentin by Dr. Work. She had repeat CT scan of the chest performed recently.  I personally and independently reviewed the scans and discussed the results with the patient today. Her scan showed no concerning findings for disease recurrence or metastasis. Her lab work showed significant elevation of her liver enzymes.  The patient mentions that she has a history of liver cirrhosis and she is followed by a hepatologist.  She also started new medication with gabapentin which can increase the liver enzyme and she has been on treatment with  Lipitor in the past. I strongly recommend for the patient to reach out to her hepatologist as well as primary care physician for evaluation of the liver dysfunction seen on the recent blood work.  In the meantime she will hold her treatment with gabapentin and Lipitor until evaluation by her PCP or hepatologist. I will see her back for follow-up visit in 4 months for evaluation and repeat CT scan of the chest for restaging of her disease. She was advised to call immediately if she has any concerning symptoms in the interval.  The patient voices understanding of current disease status and treatment options and is in agreement with the current care plan. All questions were answered. The patient knows to call the clinic with any problems, questions or concerns. We can certainly see the patient much sooner if necessary.   Disclaimer: This note was dictated with voice recognition software. Similar sounding words can inadvertently be transcribed and may not be corrected upon review.

## 2020-01-31 ENCOUNTER — Telehealth: Payer: Self-pay | Admitting: Internal Medicine

## 2020-01-31 NOTE — Telephone Encounter (Signed)
Scheduled per los. Called and left msg. mailed printout  

## 2020-02-12 ENCOUNTER — Other Ambulatory Visit: Payer: Self-pay | Admitting: Nurse Practitioner

## 2020-02-12 DIAGNOSIS — K7460 Unspecified cirrhosis of liver: Secondary | ICD-10-CM

## 2020-02-12 DIAGNOSIS — K769 Liver disease, unspecified: Secondary | ICD-10-CM

## 2020-02-12 DIAGNOSIS — Z85118 Personal history of other malignant neoplasm of bronchus and lung: Secondary | ICD-10-CM

## 2020-03-03 ENCOUNTER — Other Ambulatory Visit: Payer: Self-pay

## 2020-03-03 ENCOUNTER — Ambulatory Visit
Admission: RE | Admit: 2020-03-03 | Discharge: 2020-03-03 | Disposition: A | Discharge status: Home / Self Care | Payer: Medicare Other | Source: Ambulatory Visit | Attending: Nurse Practitioner | Admitting: Nurse Practitioner

## 2020-03-03 DIAGNOSIS — K7460 Unspecified cirrhosis of liver: Secondary | ICD-10-CM

## 2020-03-03 DIAGNOSIS — Z85118 Personal history of other malignant neoplasm of bronchus and lung: Secondary | ICD-10-CM

## 2020-03-03 DIAGNOSIS — K769 Liver disease, unspecified: Secondary | ICD-10-CM

## 2020-03-03 MED ORDER — GADOXETATE DISODIUM 0.25 MMOL/ML IV SOLN
7.0000 mL | Freq: Once | INTRAVENOUS | Status: AC | PRN
  Administered 2020-03-03: 7 mL via INTRAVENOUS

## 2020-03-23 ENCOUNTER — Ambulatory Visit: Payer: Medicare Other | Admitting: Internal Medicine

## 2020-04-09 ENCOUNTER — Ambulatory Visit: Payer: Medicare Other | Admitting: Internal Medicine

## 2020-05-06 ENCOUNTER — Encounter: Payer: Self-pay | Admitting: Internal Medicine

## 2020-05-06 ENCOUNTER — Ambulatory Visit (INDEPENDENT_AMBULATORY_CARE_PROVIDER_SITE_OTHER): Payer: Medicare Other | Admitting: Internal Medicine

## 2020-05-06 ENCOUNTER — Other Ambulatory Visit: Payer: Self-pay

## 2020-05-06 DIAGNOSIS — R058 Other specified cough: Secondary | ICD-10-CM

## 2020-05-06 DIAGNOSIS — J45991 Cough variant asthma: Secondary | ICD-10-CM | POA: Diagnosis not present

## 2020-05-06 DIAGNOSIS — R05 Cough: Secondary | ICD-10-CM | POA: Diagnosis not present

## 2020-05-06 MED ORDER — PREDNISONE 10 MG PO TABS: ORAL_TABLET | ORAL | 0 refills | Status: DC

## 2020-05-06 MED ORDER — OMEPRAZOLE 40 MG PO CPDR: DELAYED_RELEASE_CAPSULE | ORAL | 11 refills | Status: DC

## 2020-05-06 MED ORDER — TRAMADOL HCL 50 MG PO TABS: 50.0000 mg | ORAL_TABLET | ORAL | 0 refills | Status: AC | PRN

## 2020-05-06 NOTE — Patient Instructions (Addendum)
Bisoprolol is the preferred beta blocker to use on respiratory be sure to discuss with your GI doctor and make sure she knew you were on Bisoprol after last visit here 12/20/19  Let your GI doctor know you cough so hard you vomit   In meantime call us with any discrepancies when you check your medications against the list you receive today   Prednisone 10 mg take  4 each am x 2 days,   2 each am x 2 days,  1 each am x 2 days and stop   Prilosec 40 mg Take 30- 60 min before your first and last meals of the day   Keep the candy handy   If still can't stop all coughing and throat clearing, ok to use Tramadol 50 mg every 4 hours   We will be referring you to Dr Addison Bailey at [facility]   Pulmonary follow is as needed

## 2020-05-06 NOTE — Progress Notes (Signed)
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 Diagnosed in May 2020.  Biomarker Findings Microsatellite status - MS-Stable Tumor Mutational Burden - 9 Muts/Mb Genomic Findings For a complete list of the genes assayed, please refer to the Appendix. NF1 K33* TP53 R158P 8 Disease relevant genes with no reportable alterations: ALK, BRAF, EGFR, ERBB2, KRAS, MET, RET, ROS1  PDL 1 expression negative  PRIOR THERAPY: 1) Status post right lower lobectomy with lymph node dissection on Jan 07, 2019 under the care of Dr. Roxan Hockey. 2) Adjuvantplatinum based regimen with cisplatin 75 mg/M2 and Alimta 500 mg/M2 every 3 weeks.First dose onJune 30th, 2020.  Status post 4 cycles. 3) Adjuvant radiotherapy to the mediastinum under the care of Dr. Lisbeth Renshaw.  First fraction on May 23, 2019.   CURRENT THERAPY: Observation.   11/06/2019   Consultation : Sandy [name] refer re cough/  Post RT changes on ct  ? pna ? > already on pred/ augmentin  Chief Complaint  Patient presents with  . Follow-up    currently being treated with prednisone and antibiotics, cough, wheezing, shortness of breath, newly started symbicort sent to office by oncologist  Dyspnea:  MB and back more difficult but now some better on symbicort despite rx with corgard  Cough: onset in November 2020 toward end of RT Worse 1st thing in am mostly day / higher doses pred no change in severity  Sleeping: p benadryl sleeps flat s resp c/o's  SABA use: has  not been  using 02: none  rec Stop corgad  Start bisoprolol  15m one daily and if blood pressure is too high twice daily  Work on inhaler technique:    Pantoprazole (protonix) 40 mg   Take  30-60 min before first meal of the day and Pepcid (famotidine)  20 mg one after supper  until return to office - this is the best way to tell whether stomach acid is contributing to your problem.   GERD (REFLUX)  is an extremely common cause of respiratory symptoms just like yours , many times with no obvious heartburn at all.   It can be treated with medication, but also with lifestyle changes including elevation of the head of your bed (ideally with 6 -8inch blocks under the headboard of your bed),    Please schedule a follow up office visit in 4 weeks, sooner if needed  with all medications /inhalers/ solutions in hand so we can verify exactly what you are taking. This includes all medications from all doctors and over the counters   12/20/2019  f/u ov/Sandy [name] re: cough on symb 160 2bid Chief Complaint  Patient presents with  . Follow-up  Dyspnea:  mb and back s sob / 50 ft flat Cough: mostly dry mostly during the day esp with talking /  Worse end of insp  Sleeping: sleeping in all positions flat one pillow and no benadryl  SABA use: not using / never prechallenges 02: none  rec GERD diet  Increase omeprazole to 40 mg Take 30-60 min before  first meal of the day  Reduce symbicort to 80 Take 2 puffs first thing in am and then another 2 puffs about 12 hours later.  Try albuterol 15 min before an activity that you know would make you short of breath and see if it makes any difference and if makes none then don't take it after activity unless you can't catch your breath. Start gabapentin 100 mg three times daily      01/17/2020  f/u ov/Sandy [name] re: cough since nov 2020 on symbicort 69  Chief Complaint  Patient presents with  . Follow-up    pt has a chronic cough.pt states she doesn't use rescue inhaler    Dyspnea:  More difficult to get to mb back flat surface less 50 ft  Cough: dry/ pectin helps most Sleeping: usually able to lie flat/ one pillow/no noct cough  SABA use: no use  02: none  Cough to point of gag occ vomit / urinary incont  rec Prednisone 10 mg Take 4 for three days 3 for three days 2 for three days 1 for three days and stop  Try albuterol 15 min before an activity that you know would make you short of breath and see if it makes any difference and if makes none then don't take it after activity unless you can't catch your breath. Gabapentin 300 mg four times daily is the maximum dose  Pepcid 20  Mg after supper  Delsym 2 tsp every 12 hours is the strongest cough medication you can buy without presription   05/06/2020  f/u ov/Sandy [name] re: cough >> sob better on pred worse off it  Chief Complaint  Patient presents with  . Follow-up  Dyspnea: grocery shopping ok and better on prednisone than off but no better on symbicort or saba  Cough: daytime only if vomits is there any "mucus" produced but constant urge to clear throat.  Sleeping: bed is flat  SABA use: never use albuterol 02: none  Says can't take gabapentin due to liver function/ gi Placed back on nadolol (had been on bisoprolol)    No obvious day to day or daytime variability or assoc excess/ purulent sputum or mucus plugs or hemoptysis or cp or chest tightness, subjective wheeze or overt sinus or hb symptoms.   Sleeping fine flate  without nocturnal  or early am exacerbation  of respiratory  c/o's or need for noct saba. Also denies any obvious fluctuation of symptoms with weather or environmental changes or other aggravating or alleviating factors except as outlined above   No unusual exposure hx or h/o childhood pna/ asthma or knowledge of premature birth.  Current Allergies, Complete Past Medical History, Past Surgical History, Family History, and Social History were reviewed in [name]  record.  ROS  The following are not active complaints unless bolded Hoarseness, sore throat, dysphagia, dental problems, itching, sneezing,  nasal congestion or discharge of excess mucus or purulent secretions, ear ache,   fever, chills, sweats, unintended wt loss or wt gain, classically pleuritic or exertional cp,  orthopnea pnd or arm/hand swelling  or leg swelling, presyncope, palpitations, abdominal pain, anorexia, nausea, vomiting, diarrhea  or change in bowel habits or change in bladder habits, change in stools or change in urine, dysuria, hematuria,  rash, arthralgias, visual complaints, headache, numbness, weakness or ataxia or problems with walking or coordination,  change in mood or  memory.        Current Meds  Medication Sig  . acetaminophen (TYLENOL)  500 MG tablet Take 1,000 mg by mouth every 6 (six) hours as needed for moderate pain or headache.  Marland Kitchen atorvastatin (LIPITOR) 10 MG tablet Take 10 mg by mouth at bedtime.   Marland Kitchen levothyroxine (SYNTHROID) 25 MCG tablet   . metFORMIN (GLUCOPHAGE-XR) 500 MG 24 hr tablet Take 500 mg by mouth at bedtime.  . nadolol (CORGARD) 40 MG tablet Take 40 mg by mouth daily.  Marland Kitchen omeprazole (PRILOSEC) 40 MG capsule Take 1 capsule (40 mg total) by mouth daily.  . Tetrahydrozoline HCl (VISINE OP) Place 1 drop into both eyes daily as needed (irritation).  . [DISCONTINUED] famotidine (PEPCID) 20 MG tablet One after supper  . [DISCONTINUED] gabapentin (NEURONTIN) 300 MG capsule Take 1 capsule (300 mg total) by mouth 4 (four) times daily.  . [DISCONTINUED] magnesium oxide (MAG-OX) 400 (241.3 Mg) MG tablet Take 2 tablets (800 mg total) by mouth 2 (two) times daily.  . [DISCONTINUED] predniSONE (DELTASONE) 10 MG tablet Take 4 for three days 3 for three days 2 for three days 1 for three days and stop       Past Medical History:  Diagnosis Date  . Cirrhosis ([place])   . Esophageal varices ([place])   . Hypertension       Objective:     amb wf with freq throat  clearing/ no candy handy   05/06/2020        146  01/17/2020        154  12/20/2019       154   11/06/19 158 lb 9.6 oz (71.9 kg)  10/29/19 156 lb (70.8 kg)  08/28/19 166 lb 1.6 oz (75.3 kg)    Vital signs reviewed  05/06/2020  - Note at rest 02 sats  100% on RA    HEENT : pt wearing mask not removed for exam due to covid -19 concerns.    NECK :  without JVD/Nodes/TM/ nl carotid upstrokes bilaterally   LUNGS: no acc muscle use,  Nl contour chest which is clear to A and P bilaterally without cough on insp or exp maneuvers   CV:  RRR  no s3 or murmur or increase in P2, and no edema   ABD:  soft and nontender with nl inspiratory excursion in the supine position. No bruits or organomegaly appreciated, bowel sounds nl  MS:  Nl gait/ ext warm without deformities, calf tenderness, cyanosis or clubbing No obvious joint restrictions   SKIN: warm and dry without lesions    NEURO:  alert, approp, nl sensorium with  no motor or cerebellar deficits apparent.                  Assessment

## 2020-05-07 ENCOUNTER — Encounter: Payer: Self-pay | Admitting: Internal Medicine

## 2020-05-07 DIAGNOSIS — R058 Other specified cough: Secondary | ICD-10-CM | POA: Insufficient documentation

## 2020-05-07 NOTE — Assessment & Plan Note (Addendum)
Breathing no worse off all inhalers - ok to just use saba prn  In the setting of respiratory symptoms of unknown etiology,  It would be preferable to use bystolic, the most beta -1  selective Beta blocker available in sample form, with bisoprolol the most selective generic choice  on the market, at least on a trial basis, to make sure the spillover Beta 2 effects of the less specific Beta blockers are not contributing to this patient's symptoms.   >>> deferred change nadolol back to bisoprolol to GI since not actively wheezing and not clear me this an advantage of one beta blocker over the other in treating portal hypertension.

## 2020-05-07 NOTE — Assessment & Plan Note (Addendum)
Onset at end of RT rx  Nov 2020 - 11/06/2019  After extensive coaching inhaler device,  effectiveness =    75% continue symb 160 2bid - try off corgard 11/06/2019 as pt reported some improvement on symbicort > no better  - 12/20/2019  After extensive coaching inhaler device,  effectiveness =    Reduce symb 80 2bid and add gabapentin 100 mg tid  - 01/17/2020 d/c symbicort and titrate gabapentin as high as 300 mg qid  - 01/29/2020 informed by pt oncology concerned with side effects of gabapentin = elevated lfts so rec try stop and see what happens with cough and LFTs and can rechallenge later if needed  Unfortunately can't take gabapentin and can't stop clearing her throat.  Of the three most common causes of  Sub-acute / recurrent or chronic cough, only one (GERD)  can actually contribute to/ trigger  the other two (asthma and post nasal drip syndrome)  and perpetuate the cylce of cough.  While not intuitively obvious, many patients with chronic low grade reflux do not cough until there is a primary insult that disturbs the protective epithelial barrier and exposes sensitive nerve endings.   This is typically viral but can due to PNDS and  either may apply here.   The point is that once this occurs, it is difficult to eliminate the cycle  using anything but a maximally effective acid suppression regimen at least in the short run, accompanied by an appropriate diet to address non acid GERD and control / eliminate the cough itself for at least 3 days with tramadol if needed and Also added 6 days of Prednisone in case of component of Th-2 driven upper or lower airways inflammation (if cough responds short term only to relapse befor return while will on rx for uacs that would point to allergic rhinitis/ asthma or eos bronchitis)    >>> refer to ENT / Dr Joya Gaskins at [facility]          Each maintenance medication was reviewed in detail including emphasizing most importantly the difference between maintenance and prns  and under what circumstances the prns are to be triggered using an action plan format where appropriate.  Total time for H and P, chart review, counseling,  and generating customized AVS unique to this office visit / charting = 32 min

## 2020-05-19 ENCOUNTER — Telehealth: Payer: Self-pay | Admitting: Internal Medicine

## 2020-05-19 NOTE — Telephone Encounter (Signed)
R/s 10/12 appt to adjust PAL schedule for provider. Called and left msg. Mailed printout

## 2020-05-29 ENCOUNTER — Ambulatory Visit (HOSPITAL_COMMUNITY): Payer: Medicare Other

## 2020-05-29 ENCOUNTER — Inpatient Hospital Stay: Payer: Medicare Other

## 2020-06-01 ENCOUNTER — Inpatient Hospital Stay: Payer: Medicare Other | Admitting: Internal Medicine

## 2020-06-03 ENCOUNTER — Ambulatory Visit: Payer: Medicare Other | Admitting: Physician Assistant

## 2020-06-10 NOTE — Progress Notes (Signed)
[place] OFFICE PROGRESS NOTE  [name], MD [facility] [place] [address]  DIAGNOSIS: Stage IIIA (T2a, N2, M0)non-small cell lung cancer, adenocarcinoma presented with right lower lobe lung mass and mediastinal lymphadenopathy. Diagnosed in May 2020.  Biomarker Findings Microsatellite status - MS-Stable Tumor Mutational Burden - 9 Muts/Mb Genomic Findings For a complete list of the genes assayed, please refer to the Appendix. NF1 K33* TP53 R158P 8 Disease relevant genes with no reportable alterations: ALK, BRAF, EGFR, ERBB2, KRAS, MET, RET, ROS1  PDL 1 expression negative  PRIOR THERAPY:  1) Status post right lower lobectomy with lymph node dissection on Jan 07, 2019 under the care of Dr. Roxan Hockey. 2) Adjuvantplatinum based regimen with cisplatin 75 mg/M2 and Alimta 500 mg/M2 every 3 weeks.First dose onJune 30th, 2020.  Status post 4 cycles. 3) Adjuvant radiotherapy to the mediastinum under the care of Dr. Lisbeth Renshaw.  First fraction on May 23, 2019.  CURRENT THERAPY: Observation  INTERVAL HISTORY: Sandy [name] 64 y.o. female returns to the clinic for a follow up visit. The patient is feeling well today without any concerning complaints except she is endorsing a dry chronic cough since having radiation performed last year.  She is followed by Dr. Work from pulmonology.  She has tried several inhalers in the past which did not help her symptoms.  She also try Tessalon Perles which did not help her cough.  She has tried prednisone which helps.  Of note, the patient does have diabetes.  She was recently referred to ENT at [facility] who did not visualize any abnormalities in her esophagus.  She also saw a speech therapist once regarding her chronic cough.    The patient is on observation. She is here for a 3 month follow up visit. Denies any fever or chills.  She lost about 10 pounds since her last appointment.  Of note, the patient  is followed for her advanced cirrhosis.  Denies any chest pain or hemoptysis.  Denies any nausea, vomiting, diarrhea, or constipation. Denies any headache or visual changes. The patient recently had a restaging CT scan performed. The patient is here today for evaluation and to review her scan results.    MEDICAL HISTORY: Past Medical History:  Diagnosis Date   Adenocarcinoma of lung, stage 3, right ([place]) 2020   Arthritis    Asthma    Cirrhosis (HCC)    COPD (chronic obstructive pulmonary disease) (HCC)    Dyslipidemia    Dyspnea    Esophageal varices (HCC)    History of blood transfusion    History of hiatal hernia    Hypertension    Pneumonia    Type 2 diabetes mellitus (HCC)    Type II    ALLERGIES:  is allergic to codeine and nsaids.  MEDICATIONS:  Current Outpatient Medications  Medication Sig Dispense Refill   acetaminophen (TYLENOL) 500 MG tablet Take 1,000 mg by mouth every 6 (six) hours as needed for moderate pain or headache.     atorvastatin (LIPITOR) 10 MG tablet Take 10 mg by mouth at bedtime.      levothyroxine (SYNTHROID) 25 MCG tablet      losartan (COZAAR) 100 MG tablet Take 1 tablet by mouth daily.     metFORMIN (GLUCOPHAGE-XR) 500 MG 24 hr tablet Take 500 mg by mouth at bedtime.     nadolol (CORGARD) 40 MG tablet Take 40 mg by mouth daily.     omeprazole (PRILOSEC) 40 MG capsule  Take 30- 60 min before your first and last meals of the day 60 capsule 11   predniSONE (DELTASONE) 10 MG tablet Take  4 each am x 2 days,   2 each am x 2 days,  1 each am x 2 days and stop 14 tablet 0   Tetrahydrozoline HCl (VISINE OP) Place 1 drop into both eyes daily as needed (irritation).     traMADol (ULTRAM) 50 MG tablet      methylPREDNISolone (MEDROL DOSEPAK) 4 MG TBPK tablet Use as instructed 21 tablet 0   No current facility-administered medications for this visit.    SURGICAL HISTORY:  Past Surgical History:  Procedure Laterality Date    ABDOMINAL HYSTERECTOMY     ANTERIOR CRUCIATE LIGAMENT REPAIR Left    x 2   ESOPHAGOGASTRODUODENOSCOPY     LOBECTOMY Right 01/07/2019   Procedure: RIGHT LOWER LOBE LOBECTOMY AND INTERCOSTAL NERVE BLOCK;  Surgeon: Melrose Nakayama, MD;  Location: [facility];  Service: Thoracic;  Laterality: Right;   VIDEO ASSISTED THORACOSCOPY (VATS)/WEDGE RESECTION Right 01/07/2019   Procedure: VIDEO ASSISTED THORACOSCOPY (VATS) RIGHT LOWER LOBE WEDGE RESECTION;  Surgeon: Melrose Nakayama, MD;  Location: [facility];  Service: Thoracic;  Laterality: Right;    REVIEW OF SYSTEMS:   Review of Systems  Constitutional: Positive for weight loss. negative for appetite change, chills, fatigue, and fever.  HENT: Negative for mouth sores, nosebleeds, sore throat and trouble swallowing.   Eyes: Negative for eye problems and icterus.  Respiratory: Positive for chronic dry cough.  Negative for hemoptysis, shortness of breath and wheezing.   Cardiovascular: Negative for chest pain and leg swelling.  Gastrointestinal: Negative for abdominal pain, constipation, diarrhea, nausea and vomiting.  Genitourinary: Negative for bladder incontinence, difficulty urinating, dysuria, frequency and hematuria.   Musculoskeletal: Negative for back pain, gait problem, neck pain and neck stiffness.  Skin: Negative for itching and rash.  Neurological: Negative for dizziness, extremity weakness, gait problem, headaches, light-headedness and seizures.  Hematological: Negative for adenopathy. Does not bruise/bleed easily.  Psychiatric/Behavioral: Negative for confusion, depression and sleep disturbance. The patient is not nervous/anxious.     PHYSICAL EXAMINATION:  Blood pressure 112/75, pulse 93, temperature (!) 97.3 F (36.3 C), temperature source Tympanic, resp. rate 16, height 5' 3"  (1.6 m), weight 145 lb (65.8 kg), SpO2 99 %.  ECOG PERFORMANCE STATUS: 1 - Symptomatic but completely ambulatory  Physical Exam  Constitutional:  Oriented to person, place, and time and well-developed, well-nourished, and in no distress.  HENT:  Head: Normocephalic and atraumatic.  Mouth/Throat: Oropharynx is clear and moist. No oropharyngeal exudate.  Eyes: Conjunctivae are normal. Right eye exhibits no discharge. Left eye exhibits no discharge. No scleral icterus.  Neck: Normal range of motion. Neck supple.  Cardiovascular: Normal rate, regular rhythm, normal heart sounds and intact distal pulses.   Pulmonary/Chest: Effort normal and breath sounds normal. No respiratory distress. No wheezes. No rales.  Abdominal: Soft. Bowel sounds are normal. Exhibits no distension and no mass. There is no tenderness.  Musculoskeletal: Normal range of motion. Exhibits no edema.  Lymphadenopathy:    No cervical adenopathy.  Neurological: Alert and oriented to person, place, and time. Exhibits normal muscle tone. Gait normal. Coordination normal.  Skin: Skin is warm and dry. No rash noted. Not diaphoretic. No erythema. No pallor.  Psychiatric: Mood, memory and judgment normal.  Vitals reviewed.  LABORATORY DATA: Lab Results  Component Value Date   WBC 4.6 06/12/2020   HGB 11.4 (L) 06/12/2020   HCT  34.0 (L) 06/12/2020   MCV 99.4 06/12/2020   PLT 231 06/12/2020      Chemistry      Component Value Date/Time   NA 135 06/12/2020 0933   K 3.4 (L) 06/12/2020 0933   CL 101 06/12/2020 0933   CO2 26 06/12/2020 0933   BUN <4 (L) 06/12/2020 0933   CREATININE 0.80 06/12/2020 0933      Component Value Date/Time   CALCIUM 8.6 (L) 06/12/2020 0933   ALKPHOS 195 (H) 06/12/2020 0933   AST 89 (H) 06/12/2020 0933   ALT 26 06/12/2020 0933   BILITOT 1.7 (H) 06/12/2020 0933       RADIOGRAPHIC STUDIES:  CT Chest W Contrast  Result Date: 06/12/2020 CLINICAL DATA:  Restaging non-small cell lung cancer. EXAM: CT CHEST WITH CONTRAST TECHNIQUE: Multidetector CT imaging of the chest was performed during intravenous contrast administration. CONTRAST:   26m OMNIPAQUE IOHEXOL 300 MG/ML  SOLN COMPARISON:  01/06/2020. FINDINGS: Cardiovascular: Heart size appears within normal limits. No pericardial effusion identified. Aortic atherosclerosis. Coronary artery calcifications. Mediastinum/Nodes: Thyroid gland appears atrophic. The trachea appears patent and is midline. Normal appearance of the esophagus. No enlarged axillary, supraclavicular, mediastinal or hilar adenopathy. Lungs/Pleura: Centrilobular and paraseptal emphysema. Unchanged appearance of small, chronic partially loculated right pleural effusion. 4 mm subpleural nodule within the anterolateral left upper lobe is unchanged from previous exam, image 45/5. Status post right lower lobectomy. Paramediastinal masslike architectural distortion and fibrosis is identified within the right lung compatible with changes secondary to external beam radiation. No signs of recurrence of disease. Upper Abdomen: Advanced changes of cirrhosis are again noted. Multiple bilateral kidney cysts are identified. These are better characterized on MRI from 03/03/2020. Aortic atherosclerosis. Musculoskeletal: No chest wall abnormality. No acute or significant osseous findings. IMPRESSION: 1. Stable CT of the chest status post right lower lobectomy. No specific findings identified to suggest recurrence of tumor. 2. Emphysema and aortic atherosclerosis. 3. Coronary artery calcifications noted. 4. Advanced changes of cirrhosis. Aortic Atherosclerosis (ICD10-I70.0) and Emphysema (ICD10-J43.9). Electronically Signed   By: TKerby MoorsM.D.   On: 06/12/2020 16:18     ASSESSMENT/PLAN:  This is a very pleasant 64year old Caucasian female with stage IIIA non-small cell lung cancer, adenocarcinoma with no actionable mutations. She is status post right lower lobectomy with lymph node dissection under the care of Dr. HRoxan Hockeyon Jan 07, 2019.   The patient completed adjuvant systemic chemotherapy with cisplatin and Alimta status post  4 cycles.   This was also followed by adjuvant radiotherapy to the mediastinum completed in October 2020.    The patient is currently on observation and is feeling well except for a chronic cough.  The patient recently had a restaging CT scan performed.  Dr. MJulien Nordmannpersonally and independently reviewed the scan discussed the results with the patient today.  The scan did not show any evidence of disease recurrence. Dr. MJulien Nordmannrecommend that she continue on observation with a repeat CT scan of the chest in 6 months.  The patient has a history of liver cirrhosis and she is currently followed by her hepatologist.   Regarding the patient's chronic cough, the scan showed significant but stable radiation scarring in her lungs which is likely contributing to her cough.  I sent in a prescription for Medrol Dosepak to her pharmacy.  The patient was advised to monitor her blood sugar closely while taking the steroids.  She was also advised to take the steroids in the morning to avoid insomnia.  The patient was advised to call immediately if she has any concerning symptoms in the interval. The patient voices understanding of current disease status and treatment options and is in agreement with the current care plan. All questions were answered. The patient knows to call the clinic with any problems, questions or concerns. We can certainly see the patient much sooner if necessary      Orders Placed This Encounter  Procedures   CT Chest W Contrast    Standing Status:   Future    Standing Expiration Date:   06/15/2021    Order Specific Question:   If indicated for the ordered procedure, I authorize the administration of contrast media per Radiology protocol    Answer:   Yes    Order Specific Question:   Preferred imaging location?    Answer:   [facility]   CBC with Differential ([place] Only)    Standing Status:   Future    Standing Expiration Date:   06/15/2021   CMP ([facility] only)    Standing Status:   Future    Standing Expiration Date:   06/15/2021     Tobe Sos Khaliel Morey, PA-C 06/15/20   ADDENDUM: Hematology/Oncology Attending: I had a face-to-face encounter with the patient today.  I recommended her care plan.  This is a very pleasant 64 years old white female with stage IIIa non-small cell lung cancer, adenocarcinoma with no actionable mutation is status post right lower lobectomy with lymph node dissection followed by adjuvant systemic chemotherapy with cisplatin and Alimta followed by adjuvant radiation completed in October 2020.  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 She had repeat CT scan of the chest performed recently.  I personally and independently reviewed the scans and discussed the results with the patient today. Her scan showed no concerning findings for disease recurrence or progression but there was significant radiation fibrosis or pneumonitis in the right lung. I recommended for the patient to continue on observation with repeat CT scan of the chest in 6 months. For the dry cough and shortness of breath, will give the patient Medrol Dosepak today. She was advised to call immediately if she has any concerning symptoms in the interval.  Disclaimer: This note was dictated with voice recognition software. Similar sounding words can inadvertently be transcribed and may be missed upon review. Eilleen Kempf, MD 06/15/20

## 2020-06-12 ENCOUNTER — Ambulatory Visit (HOSPITAL_COMMUNITY)
Admission: RE | Admit: 2020-06-12 | Discharge: 2020-06-12 | Disposition: A | Discharge status: Home / Self Care | Payer: Medicare Other | Source: Ambulatory Visit | Attending: Internal Medicine | Admitting: Internal Medicine

## 2020-06-12 ENCOUNTER — Encounter (HOSPITAL_COMMUNITY): Payer: Self-pay

## 2020-06-12 ENCOUNTER — Inpatient Hospital Stay: Payer: Medicare Other | Attending: Internal Medicine

## 2020-06-12 ENCOUNTER — Other Ambulatory Visit: Payer: Self-pay

## 2020-06-12 DIAGNOSIS — I10 Essential (primary) hypertension: Secondary | ICD-10-CM | POA: Diagnosis not present

## 2020-06-12 DIAGNOSIS — R059 Cough, unspecified: Secondary | ICD-10-CM | POA: Diagnosis not present

## 2020-06-12 DIAGNOSIS — R590 Localized enlarged lymph nodes: Secondary | ICD-10-CM | POA: Insufficient documentation

## 2020-06-12 DIAGNOSIS — E119 Type 2 diabetes mellitus without complications: Secondary | ICD-10-CM | POA: Diagnosis not present

## 2020-06-12 DIAGNOSIS — K7460 Unspecified cirrhosis of liver: Secondary | ICD-10-CM | POA: Diagnosis not present

## 2020-06-12 DIAGNOSIS — Z7952 Long term (current) use of systemic steroids: Secondary | ICD-10-CM | POA: Insufficient documentation

## 2020-06-12 DIAGNOSIS — Z886 Allergy status to analgesic agent status: Secondary | ICD-10-CM | POA: Diagnosis not present

## 2020-06-12 DIAGNOSIS — I700 Atherosclerosis of aorta: Secondary | ICD-10-CM | POA: Insufficient documentation

## 2020-06-12 DIAGNOSIS — C3490 Malignant neoplasm of unspecified part of unspecified bronchus or lung: Secondary | ICD-10-CM

## 2020-06-12 DIAGNOSIS — Z885 Allergy status to narcotic agent status: Secondary | ICD-10-CM | POA: Diagnosis not present

## 2020-06-12 DIAGNOSIS — C3431 Malignant neoplasm of lower lobe, right bronchus or lung: Secondary | ICD-10-CM | POA: Diagnosis not present

## 2020-06-12 DIAGNOSIS — J90 Pleural effusion, not elsewhere classified: Secondary | ICD-10-CM | POA: Insufficient documentation

## 2020-06-12 DIAGNOSIS — N281 Cyst of kidney, acquired: Secondary | ICD-10-CM | POA: Insufficient documentation

## 2020-06-12 DIAGNOSIS — Z79899 Other long term (current) drug therapy: Secondary | ICD-10-CM | POA: Insufficient documentation

## 2020-06-12 DIAGNOSIS — Z9221 Personal history of antineoplastic chemotherapy: Secondary | ICD-10-CM | POA: Insufficient documentation

## 2020-06-12 HISTORY — DX: Unspecified asthma, uncomplicated: J45.909

## 2020-06-12 LAB — CBC WITH DIFFERENTIAL (CANCER CENTER ONLY)
Abs Immature Granulocytes: 0.05 10*3/uL (ref 0.00–0.07)
Basophils Absolute: 0.1 10*3/uL (ref 0.0–0.1)
Basophils Relative: 1 %
Eosinophils Absolute: 0.2 10*3/uL (ref 0.0–0.5)
Eosinophils Relative: 4 %
HCT: 34 % — ABNORMAL LOW (ref 36.0–46.0)
Hemoglobin: 11.4 g/dL — ABNORMAL LOW (ref 12.0–15.0)
Immature Granulocytes: 1 %
Lymphocytes Relative: 27 %
Lymphs Abs: 1.2 10*3/uL (ref 0.7–4.0)
MCH: 33.3 pg (ref 26.0–34.0)
MCHC: 33.5 g/dL (ref 30.0–36.0)
MCV: 99.4 fL (ref 80.0–100.0)
Monocytes Absolute: 0.4 10*3/uL (ref 0.1–1.0)
Monocytes Relative: 10 %
Neutro Abs: 2.6 10*3/uL (ref 1.7–7.7)
Neutrophils Relative %: 57 %
Platelet Count: 231 10*3/uL (ref 150–400)
RBC: 3.42 MIL/uL — ABNORMAL LOW (ref 3.87–5.11)
RDW: 16.2 % — ABNORMAL HIGH (ref 11.5–15.5)
WBC Count: 4.6 10*3/uL (ref 4.0–10.5)
nRBC: 0 % (ref 0.0–0.2)

## 2020-06-12 LAB — CMP (CANCER CENTER ONLY)
ALT: 26 U/L (ref 0–44)
AST: 89 U/L — ABNORMAL HIGH (ref 15–41)
Albumin: 2.4 g/dL — ABNORMAL LOW (ref 3.5–5.0)
Alkaline Phosphatase: 195 U/L — ABNORMAL HIGH (ref 38–126)
Anion gap: 8 (ref 5–15)
BUN: <4 mg/dL — ABNORMAL LOW (ref 8–23)
CO2: 26 mmol/L (ref 22–32)
Calcium: 8.6 mg/dL — ABNORMAL LOW (ref 8.9–10.3)
Chloride: 101 mmol/L (ref 98–111)
Creatinine: 0.8 mg/dL (ref 0.44–1.00)
GFR, Estimated: >60 mL/min (ref >60)
Glucose, Bld: 97 mg/dL (ref 70–99)
Potassium: 3.4 mmol/L — ABNORMAL LOW (ref 3.5–5.1)
Sodium: 135 mmol/L (ref 135–145)
Total Bilirubin: 1.7 mg/dL — ABNORMAL HIGH (ref 0.3–1.2)
Total Protein: 6.4 g/dL — ABNORMAL LOW (ref 6.5–8.1)

## 2020-06-12 MED ORDER — IOHEXOL 300 MG/ML  SOLN
75.0000 mL | Freq: Once | INTRAMUSCULAR | Status: AC | PRN
  Administered 2020-06-12: 75 mL via INTRAVENOUS

## 2020-06-15 ENCOUNTER — Encounter: Payer: Self-pay | Admitting: Physician Assistant

## 2020-06-15 ENCOUNTER — Inpatient Hospital Stay (HOSPITAL_BASED_OUTPATIENT_CLINIC_OR_DEPARTMENT_OTHER): Payer: Medicare Other | Admitting: Physician Assistant

## 2020-06-15 ENCOUNTER — Other Ambulatory Visit: Payer: Self-pay

## 2020-06-15 VITALS — BP 112/75 | HR 93 | Temp 97.3°F | Resp 16 | Ht 63.0 in | Wt 145.0 lb

## 2020-06-15 DIAGNOSIS — C3431 Malignant neoplasm of lower lobe, right bronchus or lung: Secondary | ICD-10-CM | POA: Diagnosis not present

## 2020-06-15 DIAGNOSIS — R059 Cough, unspecified: Secondary | ICD-10-CM | POA: Diagnosis not present

## 2020-06-15 DIAGNOSIS — R053 Chronic cough: Secondary | ICD-10-CM | POA: Diagnosis not present

## 2020-06-15 MED ORDER — METHYLPREDNISOLONE 4 MG PO TBPK: ORAL_TABLET | ORAL | 0 refills | Status: DC

## 2020-07-07 ENCOUNTER — Ambulatory Visit (INDEPENDENT_AMBULATORY_CARE_PROVIDER_SITE_OTHER): Payer: Medicare Other | Admitting: Thoracic Surgery (Cardiothoracic Vascular Surgery)

## 2020-07-07 ENCOUNTER — Encounter: Payer: Self-pay | Admitting: Thoracic Surgery (Cardiothoracic Vascular Surgery)

## 2020-07-07 ENCOUNTER — Other Ambulatory Visit: Payer: Self-pay

## 2020-07-07 VITALS — BP 121/86 | HR 129 | Resp 18 | Ht 63.0 in | Wt 135.4 lb

## 2020-07-07 DIAGNOSIS — C3491 Malignant neoplasm of unspecified part of right bronchus or lung: Secondary | ICD-10-CM

## 2020-07-07 NOTE — Progress Notes (Signed)
[address]       [place] [address]             [phone]     HPI: Sandy [name] returns for a scheduled follow-up visit  Sandy [name] is a 64 year old woman with a history of tobacco abuse, type 2 diabetes, cirrhosis, esophageal varices, hypertension, hyperlipidemia, arthritis, and lung cancer.  She had a T2N2 stage III adenocarcinoma resected with a right lower lobectomy on 01/07/2019.  She was recalled stage IIIa.  She was treated with postoperative adjuvant chemo and radiation.  I saw her in the office in May 2021.  She was still having persistent cough at that time.  She had some effusion on her chest x-ray on CT that was not particularly impressive.  She recently saw Dr. Julien [name].  She had another CT of the chest.  She feels well.  She still has a cough but is not as bad as it was previously.  It does wax and wane and when it gets severe she treats it with prednisone and it improves again.  It never completely resolved.  Her appetite has been good she has not had any significant weight loss.  Respiratory status is stable.  Past Medical History:  Diagnosis Date  . Adenocarcinoma of lung, stage 3, right ([place]) 2020  . Arthritis   . Asthma   . Cirrhosis ([place])   . COPD (chronic obstructive pulmonary disease) ([place])   . Dyslipidemia   . Dyspnea   . Esophageal varices ([place])   . History of blood transfusion   . History of hiatal hernia   . Hypertension   . Pneumonia   . Type 2 diabetes mellitus (HCC)    Type II    Current Outpatient Medications  Medication Sig Dispense Refill  . acetaminophen (TYLENOL) 500 MG tablet Take 1,000 mg by mouth every 6 (six) hours as needed for moderate pain or headache.    Marland Kitchen atorvastatin (LIPITOR) 10 MG tablet Take 10 mg by mouth at bedtime.     Marland Kitchen levothyroxine (SYNTHROID) 25 MCG tablet     . losartan (COZAAR) 100 MG tablet Take 1 tablet by mouth daily.    . metFORMIN (GLUCOPHAGE-XR) 500 MG 24 hr tablet Take 500 mg by mouth at  bedtime.    . nadolol (CORGARD) 40 MG tablet Take 40 mg by mouth daily.    Marland Kitchen omeprazole (PRILOSEC) 40 MG capsule Take 30- 60 min before your first and last meals of the day 60 capsule 11  . predniSONE (DELTASONE) 10 MG tablet Take  4 each am x 2 days,   2 each am x 2 days,  1 each am x 2 days and stop 14 tablet 0  . Tetrahydrozoline HCl (VISINE OP) Place 1 drop into both eyes daily as needed (irritation).     No current facility-administered medications for this visit.    Physical Exam BP 121/86 (BP Location: Left Arm, Patient Position: Sitting)   Pulse (!) 129   Resp 18   Ht 5\' 3"  (1.6 m)   Wt 135 lb 6.4 oz (61.4 kg)   SpO2 96% Comment: RA with mask on  BMI 23.59 kg/m  64 year old woman in no acute distress Alert and oriented x3 with no focal deficits No cervical or supraclavicular adenopathy Cardiac tachycardic, regular Lungs diminished at right base, otherwise clear  Diagnostic Tests: CT CHEST WITH CONTRAST  TECHNIQUE: Multidetector CT imaging of the chest was performed during intravenous contrast administration.  CONTRAST:  56mL OMNIPAQUE IOHEXOL 300 MG/ML  SOLN  COMPARISON:  01/06/2020.  FINDINGS: Cardiovascular: Heart size appears within normal limits. No pericardial effusion identified. Aortic atherosclerosis. Coronary artery calcifications.  Mediastinum/Nodes: Thyroid gland appears atrophic. The trachea appears patent and is midline. Normal appearance of the esophagus. No enlarged axillary, supraclavicular, mediastinal or hilar adenopathy.  Lungs/Pleura: Centrilobular and paraseptal emphysema. Unchanged appearance of small, chronic partially loculated right pleural effusion. 4 mm subpleural nodule within the anterolateral left upper lobe is unchanged from previous exam, image 45/5. Status post right lower lobectomy. Paramediastinal masslike architectural distortion and fibrosis is identified within the right lung compatible with changes secondary to  external beam radiation. No signs of recurrence of disease.  Upper Abdomen: Advanced changes of cirrhosis are again noted. Multiple bilateral kidney cysts are identified. These are better characterized on MRI from 03/03/2020. Aortic atherosclerosis.  Musculoskeletal: No chest wall abnormality. No acute or significant osseous findings.  IMPRESSION: 1. Stable CT of the chest status post right lower lobectomy. No specific findings identified to suggest recurrence of tumor. 2. Emphysema and aortic atherosclerosis. 3. Coronary artery calcifications noted. 4. Advanced changes of cirrhosis.  Aortic Atherosclerosis (ICD10-I70.0) and Emphysema (ICD10-J43.9).   Electronically Signed   By: Kerby Moors M.D.   On: 06/12/2020 16:18 I personally reviewed the CT images and concur with the findings noted above.  Rhythm strip shows sinus tachycardia with a rate of 120.  Impression: Sandy [name] is a 64 year old woman who had a right lower lobectomy for what turned out to be a T2, N2, stage IIIa adenocarcinoma back in May 2020.  She was treated with postoperative adjuvant chemo and radiation.  There were no actionable mutations.  There is no sign of recurrent disease on her current CT.  Tachycardic on exam.  Rhythm strip shows sinus tachycardia.  She is not having any chest pains or shortness of breath.  She denies been aware of her increased heart rate.  She will follow up to her primary care office tomorrow get her heart rate checked again.  She knows to go to the emergency room immediately if she has any shortness of breath, chest pain, or lightheadedness.  Plan: Follow-up as scheduled with Dr. Julien [name] Return in 6 months after CT done.  I spent 20 minutes in review of records, images, and in consultation with Sandy [name] today. Sandy Nakayama, MD [facility] [phone]

## 2020-08-20 ENCOUNTER — Other Ambulatory Visit: Payer: Self-pay

## 2020-08-20 ENCOUNTER — Encounter: Payer: Self-pay | Admitting: Cardiology

## 2020-08-20 ENCOUNTER — Other Ambulatory Visit: Payer: Self-pay | Admitting: Cardiology

## 2020-08-20 ENCOUNTER — Ambulatory Visit: Payer: Medicare Other | Admitting: Cardiology

## 2020-08-20 VITALS — BP 117/75 | HR 88 | Ht 63.0 in | Wt 138.0 lb

## 2020-08-20 DIAGNOSIS — Z902 Acquired absence of lung [part of]: Secondary | ICD-10-CM

## 2020-08-20 DIAGNOSIS — R072 Precordial pain: Secondary | ICD-10-CM

## 2020-08-20 DIAGNOSIS — R5383 Other fatigue: Secondary | ICD-10-CM

## 2020-08-20 DIAGNOSIS — E119 Type 2 diabetes mellitus without complications: Secondary | ICD-10-CM

## 2020-08-20 DIAGNOSIS — Z87891 Personal history of nicotine dependence: Secondary | ICD-10-CM

## 2020-08-20 DIAGNOSIS — Z85118 Personal history of other malignant neoplasm of bronchus and lung: Secondary | ICD-10-CM

## 2020-08-20 DIAGNOSIS — R0609 Other forms of dyspnea: Secondary | ICD-10-CM

## 2020-08-20 DIAGNOSIS — I10 Essential (primary) hypertension: Secondary | ICD-10-CM

## 2020-08-20 DIAGNOSIS — I8510 Secondary esophageal varices without bleeding: Secondary | ICD-10-CM

## 2020-08-20 NOTE — Patient Instructions (Addendum)
Please provide the patient instructions on the nearest [facility].

## 2020-08-20 NOTE — Progress Notes (Signed)
Date:  08/20/2020   ID:  Sandy [name], DOB 12-Apr-1956, MRN [id number]  PCP:  [name], MD  Cardiologist:  Rex Kras, DO, [facility] (established care 08/20/2020)  REASON FOR CONSULT: Fatigue   REQUESTING PHYSICIAN:  [name], MD [address] [place],  [place] [address]  Chief Complaint  Patient presents with  . Fatigue  . Shortness of Breath  . Chest Pain    HPI  Sandy [name] is a 64 y.o. female who presents to the office with a chief complaint of " fatigue, chest pain, shortness of breath." Patient's past medical history and cardiovascular risk factors include: Right-sided lung cancer status post right lower lobe lobectomy/VATS with chemo and radiation, hypertension, non-insulin-dependent diabetes mellitus type 2, hypothyroidism, former smoker, alcoholic cirrhosis, esophageal varices, postmenopausal female, advanced age.  She is referred to the office at the request of [name], * for evaluation of fatigue.  Patient states that she has a history of lung cancer and underwent right lower lobectomy/VATS procedure back in May 2020 subsequently underwent chemo and radiation and since then she has not been feeling well.  She states that 3 months after her chemo and radiation therapy " I have been feeling bad."  Patient states that she was told that it would take up to a year until she starts to feel back to her baseline and therefore did not seek medical attention.  However, when she went back for her yearly follow-up she voiced her concerns and was asked to follow-up with cardiology.  Chest pain: Patient states that she has chest discomfort at least once a week, located substernally, going on for the last 3 to 4 months, not brought on by effort related activities, does not resolve with rest.  Usually self-limited.  Associated symptoms also include shortness of breath with effort related activities.  Patient states that she can even walk to  complete grocery store without becoming symptomatic.  Initially they thought it could be secondary to anemia; however, the symptoms persist despite correcting her underlying anemia.  Patient denies lower extremity swelling, orthopnea, paroxysmal nocturnal dyspnea.  Clinically appears euvolemic.  At the time of evaluation she denies any chest pain.  But appears mildly dyspneic.  Patient states that this is her baseline.  FUNCTIONAL STATUS: No structured exercise program or daily routine.   ALLERGIES: Allergies  Allergen Reactions  . Codeine Nausea And Vomiting  . Nsaids     Avoid due to esophageal varices    MEDICATION LIST PRIOR TO VISIT: Current Meds  Medication Sig  . atorvastatin (LIPITOR) 10 MG tablet Take 10 mg by mouth at bedtime.   Marland Kitchen levothyroxine (SYNTHROID) 25 MCG tablet   . losartan (COZAAR) 100 MG tablet Take 1 tablet by mouth daily.  . metFORMIN (GLUCOPHAGE-XR) 500 MG 24 hr tablet Take 500 mg by mouth at bedtime.  . nadolol (CORGARD) 20 MG tablet Take 20 mg by mouth daily.  Marland Kitchen omeprazole (PRILOSEC) 40 MG capsule Take 30- 60 min before your first and last meals of the day  . Tetrahydrozoline HCl (VISINE OP) Place 1 drop into both eyes daily as needed (irritation).     PAST MEDICAL HISTORY: Past Medical History:  Diagnosis Date  . Adenocarcinoma of lung, stage 3, right ([place]) 2020  . Arthritis   . Asthma   . Cirrhosis ([place])   . COPD (chronic obstructive pulmonary disease) ([place])   . Dyslipidemia   . Dyspnea   . Esophageal varices ([place])   .  History of blood transfusion   . History of hiatal hernia   . Hypertension   . Pneumonia   . Type 2 diabetes mellitus ([place])    Type II    PAST SURGICAL HISTORY: Past Surgical History:  Procedure Laterality Date  . ABDOMINAL HYSTERECTOMY    . ANTERIOR CRUCIATE LIGAMENT REPAIR Left    x 2  . ESOPHAGOGASTRODUODENOSCOPY    . LOBECTOMY Right 01/07/2019   Procedure: RIGHT LOWER LOBE LOBECTOMY AND INTERCOSTAL NERVE BLOCK;   Surgeon: Melrose Nakayama, MD;  Location: [facility];  Service: Thoracic;  Laterality: Right;  Marland Kitchen VIDEO ASSISTED THORACOSCOPY (VATS)/WEDGE RESECTION Right 01/07/2019   Procedure: VIDEO ASSISTED THORACOSCOPY (VATS) RIGHT LOWER LOBE WEDGE RESECTION;  Surgeon: Melrose Nakayama, MD;  Location: [facility] OR;  Service: Thoracic;  Laterality: Right;    FAMILY HISTORY: The patient family history includes Hypertension in her sister; Leukemia in her mother; Parkinson's disease in her father.  SOCIAL HISTORY:  The patient  reports that she quit smoking about 6 years ago. She has a 37.00 pack-year smoking history. She has never used smokeless tobacco. She reports current alcohol use of about 1.0 standard drink of alcohol per week. She reports current drug use. Drug: Marijuana.  REVIEW OF SYSTEMS: Review of Systems  Constitutional: Positive for malaise/fatigue. Negative for chills and fever.  HENT: Negative for hoarse voice and nosebleeds.   Eyes: Negative for discharge, double vision and pain.  Cardiovascular: Positive for dyspnea on exertion and palpitations. Negative for chest pain, claudication, leg swelling, near-syncope, orthopnea, paroxysmal nocturnal dyspnea and syncope.  Respiratory: Positive for cough (dry cough) and shortness of breath. Negative for hemoptysis.   Musculoskeletal: Negative for muscle cramps and myalgias.  Gastrointestinal: Negative for abdominal pain, constipation, diarrhea, hematemesis, hematochezia, melena, nausea and vomiting.  Neurological: Negative for dizziness and light-headedness.    PHYSICAL EXAM: Vitals with BMI 08/20/2020 07/07/2020 06/15/2020  Height 5\' 3"  5\' 3"  5\' 3"   Weight 138 lbs 135 lbs 6 oz 145 lbs  BMI 24.45 40.98 11.91  Systolic 478 295 621  Diastolic 75 86 75  Pulse 88 129 93    CONSTITUTIONAL: Appears older than stated age, hemodynamically stable, well-nourished. No acute distress.  SKIN: Skin is warm and dry. No rash noted. No cyanosis. No pallor. No  jaundice HEAD: Normocephalic and atraumatic.  EYES: No scleral icterus MOUTH/THROAT: Moist oral membranes.  NECK: No JVD present. No thyromegaly noted. No carotid bruits  LYMPHATIC: No visible cervical adenopathy.  CHEST Normal respiratory effort. No intercostal retractions  LUNGS: Decreased breath sounds at the right lower base, no wheezes or rales.   CARDIOVASCULAR: Regular, positive H0-Q6, soft holosystolic murmur heard at the apex, no gallops or rubs. ABDOMINAL: No apparent ascites.  Nonobese, soft, nontender not distended, positive bowel sounds in all 4 quadrants EXTREMITIES: No peripheral edema  HEMATOLOGIC: No significant bruising NEUROLOGIC: Oriented to person, place, and time. Nonfocal. Normal muscle tone.  PSYCHIATRIC: Normal mood and affect. Normal behavior. Cooperative  CARDIAC DATABASE: EKG: 08/20/2020: Sinus  Rhythm, 85 bpm, normal axis, without underlying injury pattern, occasional PVCs.  Echocardiogram: None   Stress Testing: None  Heart Catheterization: None  LABORATORY DATA: CBC Latest Ref Rng & Units 06/12/2020 01/27/2020 11/16/2019  WBC 4.0 - 10.5 K/uL 4.6 5.7 3.5(L)  Hemoglobin 12.0 - 15.0 g/dL 11.4(L) 11.6(L) 13.7  Hematocrit 36.0 - 46.0 % 34.0(L) 36.4 42.5  Platelets 150 - 400 K/uL 231 246 202    CMP Latest Ref Rng & Units 06/12/2020 01/27/2020 11/16/2019  Glucose 70 - 99 mg/dL 97 100(H) 126(H)  BUN 8 - 23 mg/dL <4(L) 11 7(L)  Creatinine 0.44 - 1.00 mg/dL 0.80 1.02(H) 0.92  Sodium 135 - 145 mmol/L 135 138 136  Potassium 3.5 - 5.1 mmol/L 3.4(L) 4.7 3.7  Chloride 98 - 111 mmol/L 101 100 101  CO2 22 - 32 mmol/L 26 25 20(L)  Calcium 8.9 - 10.3 mg/dL 8.6(L) 9.0 9.4  Total Protein 6.5 - 8.1 g/dL 6.4(L) 6.3(L) 6.6  Total Bilirubin 0.3 - 1.2 mg/dL 1.7(H) 2.6(H) 2.6(H)  Alkaline Phos 38 - 126 U/L 195(H) 164(H) 104  AST 15 - 41 U/L 89(H) 228(HH) 92(H)  ALT 0 - 44 U/L 26 158(H) 50(H)    Lipid Panel  No results found for: CHOL, TRIG, HDL, CHOLHDL, VLDL,  LDLCALC, LDLDIRECT, LABVLDL  No components found for: NTPROBNP No results for input(s): PROBNP in the last 8760 hours. No results for input(s): TSH in the last 8760 hours.  BMP Recent Labs    10/25/19 0925 11/16/19 0848 01/27/20 1011 06/12/20 0933  NA 133* 136 138 135  K 4.4 3.7 4.7 3.4*  CL 99 101 100 101  CO2 22 20* 25 26  GLUCOSE 118* 126* 100* 97  BUN 10 7* 11 <4*  CREATININE 1.51* 0.92 1.02* 0.80  CALCIUM 8.6* 9.4 9.0 8.6*  GFRNONAA 36* >60 58* >60  GFRAA 42* >60 >60  --     HEMOGLOBIN A1C Lab Results  Component Value Date   HGBA1C 6.0 (H) 01/01/2019   MPG 125.5 01/01/2019    IMPRESSION:    ICD-10-CM   1. Fatigue, unspecified type  R53.83 EKG 12-Lead    Hemoglobin and hematocrit, blood    TSH  2. Precordial pain  R07.2 PCV ECHOCARDIOGRAM COMPLETE    PCV MYOCARDIAL PERFUSION WITH LEXISCAN  3. DOE (dyspnea on exertion)  I43.32 Basic metabolic panel    B Nat Peptide    PCV ECHOCARDIOGRAM COMPLETE    PCV MYOCARDIAL PERFUSION WITH LEXISCAN  4. Hx of cancer of lung  Z85.118   5. S/P lobectomy of lung  Z90.2   6. Essential hypertension  I10   7. Former smoker  Z87.891   76. Type 2 diabetes mellitus without complication, without long-term current use of insulin ([facility])  E11.9   9. Secondary esophageal varices without bleeding ([facility])  I85.10      RECOMMENDATIONS: Sandy [name] is a 64 y.o. female whose past medical history and cardiac risk factors include: Right-sided lung cancer status post right lower lobe lobectomy/VATS with chemo and radiation, hypertension, non-insulin-dependent diabetes mellitus type 2, hypothyroidism, former smoker, alcoholic cirrhosis, esophageal varices, postmenopausal female, advanced age.  Fatigue:  Check TSH given her underlying hypothyroidism.  Check hemoglobin hematocrit given her history of alcoholic cirrhosis/esophageal varices and history of anemia  Given her history of chemotherapy and radiation concern for underlying  cardiomyopathy.   Precordial pain:  Patient symptoms are atypical; however, as multiple cardiovascular risk factors as outlined above.  Given her symptoms of precordial pain and effort related dyspnea ischemic evaluation is warranted.  Echocardiogram will be ordered to evaluate for structural heart disease and left ventricular systolic function.  Nuclear stress test recommended to evaluate for reversible ischemia.  Patient is asked to see medical attention if she has symptoms of typical chest pain or if her symptoms increase in intensity, frequency, and/or duration by going to the closest ER via EMS.  Dyspnea on exertion:  See above.  Check BNP  Non-insulin-dependent diabetes mellitus type 2:  Currently on Metformin.  Most recent hemoglobin A1c reviewed.  Currently managed by primary care provider.  Alcoholic cirrhosis of the liver with esophageal varices: Currently on nadolol.  Defer management to GI and PCP.  Former smoker: Educated on the importance of continued smoking cessation.  Independently reviewed electronic medical records, outside labs provided by PCP, and PCP notes as a part of this encounter.  Discussed disease management and coordination of care with the patient as well.  I would like to see her back in 3 weeks or sooner if symptoms worsen.  FINAL MEDICATION LIST END OF ENCOUNTER: No orders of the defined types were placed in this encounter.    Current Outpatient Medications:  .  atorvastatin (LIPITOR) 10 MG tablet, Take 10 mg by mouth at bedtime. , Disp: , Rfl:  .  levothyroxine (SYNTHROID) 25 MCG tablet, , Disp: , Rfl:  .  losartan (COZAAR) 100 MG tablet, Take 1 tablet by mouth daily., Disp: , Rfl:  .  metFORMIN (GLUCOPHAGE-XR) 500 MG 24 hr tablet, Take 500 mg by mouth at bedtime., Disp: , Rfl:  .  nadolol (CORGARD) 20 MG tablet, Take 20 mg by mouth daily., Disp: , Rfl:  .  omeprazole (PRILOSEC) 40 MG capsule, Take 30- 60 min before your first and last meals of the  day, Disp: 60 capsule, Rfl: 11 .  Tetrahydrozoline HCl (VISINE OP), Place 1 drop into both eyes daily as needed (irritation)., Disp: , Rfl:   Orders Placed This Encounter  Procedures  . Hemoglobin and hematocrit, blood  . Basic metabolic panel  . B Nat Peptide  . TSH  . PCV MYOCARDIAL PERFUSION WITH LEXISCAN  . EKG 12-Lead  . PCV ECHOCARDIOGRAM COMPLETE   Patient Instructions  Please provide the patient instructions on the nearest [facility].  --Continue cardiac medications as reconciled in final medication list. --Return in about 3 weeks (around 09/10/2020) for Follow up, Dyspnea, Chest pain. Or sooner if needed. --Continue follow-up with your primary care physician regarding the management of your other chronic comorbid conditions.  Patient's questions and concerns were addressed to her satisfaction. She voices understanding of the instructions provided during this encounter.   This note was created using a voice recognition software as a result there may be grammatical errors inadvertently enclosed that do not reflect the nature of this encounter. Every attempt is made to correct such errors.  Rex Kras, [place], [facility]  Pager: [phone] Office: [phone]

## 2020-08-26 ENCOUNTER — Ambulatory Visit: Payer: Medicare Other

## 2020-08-26 ENCOUNTER — Other Ambulatory Visit: Payer: Self-pay

## 2020-08-31 ENCOUNTER — Other Ambulatory Visit: Payer: Self-pay

## 2020-08-31 ENCOUNTER — Ambulatory Visit: Payer: Medicare Other

## 2020-08-31 DIAGNOSIS — R0609 Other forms of dyspnea: Secondary | ICD-10-CM

## 2020-08-31 DIAGNOSIS — R0600 Dyspnea, unspecified: Secondary | ICD-10-CM

## 2020-08-31 DIAGNOSIS — R072 Precordial pain: Secondary | ICD-10-CM

## 2020-09-04 ENCOUNTER — Other Ambulatory Visit: Payer: Self-pay

## 2020-09-04 ENCOUNTER — Ambulatory Visit: Payer: Medicare Other

## 2020-09-10 ENCOUNTER — Ambulatory Visit: Payer: Medicare Other | Admitting: Cardiology

## 2020-09-24 LAB — HEMOGLOBIN AND HEMATOCRIT, BLOOD
Hematocrit: 29.4 % — ABNORMAL LOW (ref 34.0–46.6)
Hemoglobin: 10 g/dL — ABNORMAL LOW (ref 11.1–15.9)

## 2020-09-24 LAB — BASIC METABOLIC PANEL
BUN/Creatinine Ratio: 8 — ABNORMAL LOW (ref 12–28)
BUN: 7 mg/dL — ABNORMAL LOW (ref 8–27)
CO2: 21 mmol/L (ref 20–29)
Calcium: 8.5 mg/dL — ABNORMAL LOW (ref 8.7–10.3)
Chloride: 105 mmol/L (ref 96–106)
Creatinine, Ser: 0.87 mg/dL (ref 0.57–1.00)
GFR calc Af Amer: 81 mL/min/{1.73_m2} (ref >59)
GFR calc non Af Amer: 71 mL/min/{1.73_m2} (ref >59)
Glucose: 104 mg/dL — ABNORMAL HIGH (ref 65–99)
Potassium: 4.1 mmol/L (ref 3.5–5.2)
Sodium: 139 mmol/L (ref 134–144)

## 2020-09-24 LAB — BRAIN NATRIURETIC PEPTIDE: BNP: 224.9 pg/mL — ABNORMAL HIGH (ref 0.0–100.0)

## 2020-09-24 LAB — TSH: TSH: 3.56 u[IU]/mL (ref 0.450–4.500)

## 2020-09-30 ENCOUNTER — Other Ambulatory Visit: Payer: Self-pay | Admitting: Cardiology

## 2020-09-30 ENCOUNTER — Ambulatory Visit: Payer: Medicare Other | Admitting: Cardiology

## 2020-09-30 ENCOUNTER — Encounter: Payer: Self-pay | Admitting: Cardiology

## 2020-09-30 ENCOUNTER — Other Ambulatory Visit: Payer: Self-pay

## 2020-09-30 VITALS — BP 106/67 | HR 97 | Temp 97.2°F | Resp 16 | Ht 63.0 in | Wt 136.2 lb

## 2020-09-30 DIAGNOSIS — Z87891 Personal history of nicotine dependence: Secondary | ICD-10-CM

## 2020-09-30 DIAGNOSIS — Z902 Acquired absence of lung [part of]: Secondary | ICD-10-CM

## 2020-09-30 DIAGNOSIS — Z85118 Personal history of other malignant neoplasm of bronchus and lung: Secondary | ICD-10-CM

## 2020-09-30 DIAGNOSIS — E119 Type 2 diabetes mellitus without complications: Secondary | ICD-10-CM

## 2020-09-30 DIAGNOSIS — R0600 Dyspnea, unspecified: Secondary | ICD-10-CM

## 2020-09-30 DIAGNOSIS — R0609 Other forms of dyspnea: Secondary | ICD-10-CM

## 2020-09-30 DIAGNOSIS — R5383 Other fatigue: Secondary | ICD-10-CM

## 2020-09-30 DIAGNOSIS — Z712 Person consulting for explanation of examination or test findings: Secondary | ICD-10-CM

## 2020-09-30 DIAGNOSIS — I8510 Secondary esophageal varices without bleeding: Secondary | ICD-10-CM

## 2020-09-30 DIAGNOSIS — R072 Precordial pain: Secondary | ICD-10-CM

## 2020-09-30 DIAGNOSIS — I10 Essential (primary) hypertension: Secondary | ICD-10-CM

## 2020-09-30 MED ORDER — SPIRONOLACTONE 25 MG PO TABS
25.0000 mg | ORAL_TABLET | Freq: Every morning | ORAL | 0 refills | Status: DC
Start: 2020-09-30 — End: 2020-09-30

## 2020-09-30 NOTE — Progress Notes (Signed)
Date:  09/30/2020   ID:  [name], DOB 1956/01/07, MRN [id number]  PCP:  Sandy Downing, MD  Cardiologist:  Rex Kras, DO, [facility] (established care 08/20/2020)  Date: 09/30/20 Last Office Visit: 08/20/2020  Chief Complaint  Patient presents with  . Shortness of Breath  . Follow-up    HPI  Sandy [name] is a 65 y.o. female who presents to the office with a chief complaint of " reevaluation of shortness of breath and review test results."  Her past medical history and cardiovascular risk factors include: Right-sided lung cancer status post right lower lobe lobectomy/VATS with chemo and radiation, hypertension, non-insulin-dependent diabetes mellitus type 2, hypothyroidism, former smoker, alcoholic cirrhosis, esophageal varices, postmenopausal female, advanced age.  She is referred to the office at the request of Sandy [name], * for evaluation of fatigue.  Patient states that she has a history of lung cancer and underwent right lower lobectomy/VATS procedure back in May 2020 subsequently underwent chemo and radiation and since then she has not been feeling well.  She states that 3 months after her chemo and radiation therapy " I have been feeling bad."  Patient states that she was told that it would take up to a year until she starts to feel back to her baseline and therefore did not seek medical attention.  However, when she went back for her yearly follow-up she voiced her concerns and was asked to follow-up with cardiology.  During initial consultation patient complained of symptoms of chest pain which appeared to be atypical in nature.  However, given her multiple cardiovascular risk factors that shared decision was to proceed with an ischemic evaluation.  Patient did undergo an echocardiogram which notes preserved left ventricular systolic function, mild/moderate valvular heart disease, and small to medium pericardial effusion without hemodynamic significance.   Nuclear stress test was overall a low risk study.  Since last office visit patient states that she no longer has chest pain.  She continues to have shortness of breath at rest and with effort related activities.  Patient overall appears euvolemic and not in congestive heart failure.  Blood work done since last office visit notes mildly elevated BNP level.  Kidney function is stable.  And patient's hemoglobin is approximately 10 g/dL and in March 2021 her hemoglobin was approximately 13.7 g/dL. She denies any gross evidence of blood loss.  FUNCTIONAL STATUS: No structured exercise program or daily routine.   ALLERGIES: Allergies  Allergen Reactions  . Codeine Nausea And Vomiting  . Nsaids     Avoid due to esophageal varices    MEDICATION LIST PRIOR TO VISIT: Current Meds  Medication Sig  . atorvastatin (LIPITOR) 10 MG tablet Take 10 mg by mouth at bedtime.   Marland Kitchen levothyroxine (SYNTHROID) 25 MCG tablet   . losartan (COZAAR) 100 MG tablet Take 1 tablet by mouth daily.  . metFORMIN (GLUCOPHAGE-XR) 500 MG 24 hr tablet Take 500 mg by mouth at bedtime.  . nadolol (CORGARD) 20 MG tablet Take 20 mg by mouth daily.  Marland Kitchen omeprazole (PRILOSEC) 40 MG capsule Take 30- 60 min before your first and last meals of the day  . [DISCONTINUED] spironolactone (ALDACTONE) 25 MG tablet Take 1 tablet (25 mg total) by mouth in the morning.     PAST MEDICAL HISTORY: Past Medical History:  Diagnosis Date  . Adenocarcinoma of lung, stage 3, right ([place]) 2020  . Arthritis   . Asthma   . Cirrhosis ([place])   . COPD (chronic  obstructive pulmonary disease) ([place])   . Dyslipidemia   . Dyspnea   . Esophageal varices ([place])   . History of blood transfusion   . History of hiatal hernia   . Hypertension   . Pneumonia   . Type 2 diabetes mellitus ([place])    Type II    PAST SURGICAL HISTORY: Past Surgical History:  Procedure Laterality Date  . ABDOMINAL HYSTERECTOMY    . ANTERIOR CRUCIATE LIGAMENT REPAIR Left    x 2   . ESOPHAGOGASTRODUODENOSCOPY    . LOBECTOMY Right 01/07/2019   Procedure: RIGHT LOWER LOBE LOBECTOMY AND INTERCOSTAL NERVE BLOCK;  Surgeon: Melrose Nakayama, MD;  Location: [place] [facility];  Service: Thoracic;  Laterality: Right;  Marland Kitchen VIDEO ASSISTED THORACOSCOPY (VATS)/WEDGE RESECTION Right 01/07/2019   Procedure: VIDEO ASSISTED THORACOSCOPY (VATS) RIGHT LOWER LOBE WEDGE RESECTION;  Surgeon: Melrose Nakayama, MD;  Location[facility];  Service: Thoracic;  Laterality: Right;    FAMILY HISTORY: The patient family history includes Hypertension in her sister; Leukemia in her mother; Parkinson's disease in her father.  SOCIAL HISTORY:  The patient  reports that she quit smoking about 6 years ago. She has a 37.00 pack-year smoking history. She has never used smokeless tobacco. She reports current alcohol use of about 1.0 standard drink of alcohol per week. She reports previous drug use. Drug: Marijuana.  REVIEW OF SYSTEMS: Review of Systems  Constitutional: Positive for malaise/fatigue. Negative for chills and fever.  HENT: Negative for hoarse voice and nosebleeds.   Eyes: Negative for discharge, double vision and pain.  Cardiovascular: Positive for dyspnea on exertion and palpitations. Negative for chest pain, claudication, leg swelling, near-syncope, orthopnea, paroxysmal nocturnal dyspnea and syncope.  Respiratory: Positive for cough (dry cough) and shortness of breath. Negative for hemoptysis.   Musculoskeletal: Negative for muscle cramps and myalgias.  Gastrointestinal: Negative for abdominal pain, constipation, diarrhea, hematemesis, hematochezia, melena, nausea and vomiting.  Neurological: Negative for dizziness and light-headedness.    PHYSICAL EXAM: Vitals with BMI 09/30/2020 08/20/2020 07/07/2020  Height 5\' 3"  5\' 3"  5\' 3"   Weight 136 lbs 3 oz 138 lbs 135 lbs 6 oz  BMI 24.13 69.67 89.38  Systolic 101 751 025  Diastolic 67 75 86  Pulse 97 88 129    CONSTITUTIONAL: Appears older than  stated age, hemodynamically stable, well-nourished. No acute distress.  SKIN: Skin is warm and dry. No rash noted. No cyanosis. No pallor. No jaundice HEAD: Normocephalic and atraumatic.  EYES: No scleral icterus MOUTH/THROAT: Moist oral membranes.  NECK: No JVD present. No thyromegaly noted. No carotid bruits  LYMPHATIC: No visible cervical adenopathy.  CHEST Normal respiratory effort. No intercostal retractions  LUNGS: Decreased breath sounds at the right lower base, no wheezes or rales.   CARDIOVASCULAR: Regular, positive E5-I7, soft holosystolic murmur heard at the apex, no gallops or rubs. ABDOMINAL: No apparent ascites.  Nonobese, soft, nontender not distended, positive bowel sounds in all 4 quadrants EXTREMITIES: No peripheral edema  HEMATOLOGIC: No significant bruising NEUROLOGIC: Oriented to person, place, and time. Nonfocal. Normal muscle tone.  PSYCHIATRIC: Normal mood and affect. Normal behavior. Cooperative  CARDIAC DATABASE: EKG: 08/20/2020: Sinus  Rhythm, 85 bpm, normal axis, without underlying injury pattern, occasional PVCs.  Echocardiogram: 09/04/2020: Left ventricle cavity is normal in size and wall thickness. Normal global wall motion. Normal LV systolic function with visual EF 50-55%. Indeterminate diastolic filling pattern due to E/A fusion.  Aortic valve not well visualized. Mild to moderate aortic regurgitation. Mild calcification of the mitral valve  annulus. Trace mitral regurgitation. Mild tricuspid regurgitation. Estimated pulmonary artery systolic pressure 21 mmHg. Small to medium pericardial effusion with clear fluids loculated at the apex of the heart. There is no hemodynamic significance apparent.   Stress Testing: Lexiscan (Walking with mod Bruce)Tetrofosmin Stress Test 08/31/2020: Nondiagnostic ECG stress. Myocardial perfusion is normal. Overall LV systolic function is normal without regional wall motion abnormalities. Stress LV EF: 66%.  No previous  exam available for comparison. Low risk.  Heart Catheterization: None  LABORATORY DATA: CBC Latest Ref Rng & Units 09/23/2020 06/12/2020 01/27/2020  WBC 4.0 - 10.5 K/uL - 4.6 5.7  Hemoglobin 11.1 - 15.9 g/dL 10.0(L) 11.4(L) 11.6(L)  Hematocrit 34.0 - 46.6 % 29.4(L) 34.0(L) 36.4  Platelets 150 - 400 K/uL - 231 246    CMP Latest Ref Rng & Units 09/23/2020 06/12/2020 01/27/2020  Glucose 65 - 99 mg/dL 104(H) 97 100(H)  BUN 8 - 27 mg/dL 7(L) <4(L) 11  Creatinine 0.57 - 1.00 mg/dL 0.87 0.80 1.02(H)  Sodium 134 - 144 mmol/L 139 135 138  Potassium 3.5 - 5.2 mmol/L 4.1 3.4(L) 4.7  Chloride 96 - 106 mmol/L 105 101 100  CO2 20 - 29 mmol/L 21 26 25   Calcium 8.7 - 10.3 mg/dL 8.5(L) 8.6(L) 9.0  Total Protein 6.5 - 8.1 g/dL - 6.4(L) 6.3(L)  Total Bilirubin 0.3 - 1.2 mg/dL - 1.7(H) 2.6(H)  Alkaline Phos 38 - 126 U/L - 195(H) 164(H)  AST 15 - 41 U/L - 89(H) 228(HH)  ALT 0 - 44 U/L - 26 158(H)    Lipid Panel  No results found for: CHOL, TRIG, HDL, CHOLHDL, VLDL, LDLCALC, LDLDIRECT, LABVLDL  No components found for: NTPROBNP No results for input(s): PROBNP in the last 8760 hours. Recent Labs    09/23/20 1143  TSH 3.560    BMP Recent Labs    11/16/19 0848 01/27/20 1011 06/12/20 0933 09/23/20 1143  NA 136 138 135 139  K 3.7 4.7 3.4* 4.1  CL 101 100 101 105  CO2 20* 25 26 21   GLUCOSE 126* 100* 97 104*  BUN 7* 11 <4* 7*  CREATININE 0.92 1.02* 0.80 0.87  CALCIUM 9.4 9.0 8.6* 8.5*  GFRNONAA >60 58* >60 71  GFRAA >60 >60  --  81    HEMOGLOBIN A1C Lab Results  Component Value Date   HGBA1C 6.0 (H) 01/01/2019   MPG 125.5 01/01/2019    IMPRESSION:    ICD-10-CM   1. DOE (dyspnea on exertion)  D22.02 Basic metabolic panel    Magnesium    B Nat Peptide    DISCONTINUED: spironolactone (ALDACTONE) 25 MG tablet  2. Fatigue, unspecified type  R53.83   3. Precordial pain  R07.2   4. Hx of cancer of lung  Z85.118   5. S/P lobectomy of lung  Z90.2   6. Essential hypertension  I10   7.  Former smoker  Z87.891   69. Type 2 diabetes mellitus without complication, without long-term current use of insulin (HCC)  E11.9   9. Secondary esophageal varices without bleeding (HCC)  I85.10 DISCONTINUED: spironolactone (ALDACTONE) 25 MG tablet  10. Encounter to discuss test results  Z71.2      RECOMMENDATIONS: Sandy [name] is a 65 y.o. female whose past medical history and cardiac risk factors include: Right-sided lung cancer status post right lower lobe lobectomy/VATS with chemo and radiation, hypertension, non-insulin-dependent diabetes mellitus type 2, hypothyroidism, former smoker, alcoholic cirrhosis, esophageal varices, postmenopausal female, advanced age.  Dyspnea on exertion: Patient is informed that  her symptoms are mostly multifactorial given her extensive pulmonary history including lobectomy/chemotherapy and radiation.  She also has an extensive history of smoking which predisposes her to COPD/emphysema.  She is asked to follow-up with her pulmonologist regarding the further evaluation and management.  Of note, she is not on any inhalers. Recent blood work also notes that she is anemic compared to April 2021.  Patient is asked to follow-up with her PCP for additional work-up. Ischemic evaluation included an echocardiogram and stress test.  Patient's nuclear stress test is overall low risk study illustrating normal myocardial perfusion. Patient's BNP was mildly elevated.  Given her history of alcoholic cirrhosis and esophageal varices we will start her on spironolactone 25 mg p.o. daily.  Blood work in 1 week to evaluate kidney function and electrolytes. She is advised to follow-up with PCP and pulmonary medicine for other noncardiac causes of her dyspnea on exertion.  Precordial pain: Resolved  Ischemic evaluation included echocardiogram and stress test.  Results reviewed with the patient.  Findings noted above.  No additional cardiovascular testing needed at this time.     Non-insulin-dependent diabetes mellitus type 2: Currently on Metformin.  Most recent hemoglobin A1c reviewed.  Currently managed by primary care provider.  Alcoholic cirrhosis of the liver with esophageal varices: Currently on nadolol.  Defer management to GI and PCP.  Former smoker: Educated on the importance of continued smoking cessation.  FINAL MEDICATION LIST END OF ENCOUNTER:   Current Outpatient Medications:  .  atorvastatin (LIPITOR) 10 MG tablet, Take 10 mg by mouth at bedtime. , Disp: , Rfl:  .  levothyroxine (SYNTHROID) 25 MCG tablet, , Disp: , Rfl:  .  losartan (COZAAR) 100 MG tablet, Take 1 tablet by mouth daily., Disp: , Rfl:  .  metFORMIN (GLUCOPHAGE-XR) 500 MG 24 hr tablet, Take 500 mg by mouth at bedtime., Disp: , Rfl:  .  nadolol (CORGARD) 20 MG tablet, Take 20 mg by mouth daily., Disp: , Rfl:  .  omeprazole (PRILOSEC) 40 MG capsule, Take 30- 60 min before your first and last meals of the day, Disp: 60 capsule, Rfl: 11 .  spironolactone (ALDACTONE) 25 MG tablet, TAKE 1 TABLET(25 MG) BY MOUTH IN THE MORNING, Disp: 90 tablet, Rfl: 0  Orders Placed This Encounter  Procedures  . Basic metabolic panel  . Magnesium  . B Nat Peptide   There are no Patient Instructions on file for this visit. --Continue cardiac medications as reconciled in final medication list. --Return in about 6 months (around 03/30/2021) for Follow up, Dyspnea. Or sooner if needed. --Continue follow-up with your primary care physician regarding the management of your other chronic comorbid conditions.  Patient's questions and concerns were addressed to her satisfaction. She voices understanding of the instructions provided during this encounter.   This note was created using a voice recognition software as a result there may be grammatical errors inadvertently enclosed that do not reflect the nature of this encounter. Every attempt is made to correct such errors.  Total time spent: 32 minutes.   Reevaluate her symptoms since last office visit.  Independently reviewed labs from September 23, 2020.  And also reviewed the most recent echocardiogram and nuclear stress test results with the patient during today's office encounter.  Additional medical therapy and lab work ordered and coordination of care performed.  Rex Kras, [place], [facility]  Pager: [phone] Office: [phone]

## 2020-10-21 ENCOUNTER — Other Ambulatory Visit: Payer: Self-pay | Admitting: Nurse Practitioner

## 2020-10-21 DIAGNOSIS — K7460 Unspecified cirrhosis of liver: Secondary | ICD-10-CM

## 2020-10-21 LAB — BASIC METABOLIC PANEL
BUN/Creatinine Ratio: 6 — ABNORMAL LOW (ref 12–28)
BUN: 6 mg/dL — ABNORMAL LOW (ref 8–27)
CO2: 19 mmol/L — ABNORMAL LOW (ref 20–29)
Calcium: 9.1 mg/dL (ref 8.7–10.3)
Chloride: 106 mmol/L (ref 96–106)
Creatinine, Ser: 0.96 mg/dL (ref 0.57–1.00)
Glucose: 117 mg/dL — ABNORMAL HIGH (ref 65–99)
Potassium: 5 mmol/L (ref 3.5–5.2)
Sodium: 138 mmol/L (ref 134–144)
eGFR: 66 mL/min/{1.73_m2} (ref >59)

## 2020-10-21 LAB — MAGNESIUM: Magnesium: 1.6 mg/dL (ref 1.6–2.3)

## 2020-10-21 LAB — BRAIN NATRIURETIC PEPTIDE: BNP: 178.3 pg/mL — ABNORMAL HIGH (ref 0.0–100.0)

## 2020-11-10 ENCOUNTER — Ambulatory Visit
Admission: RE | Admit: 2020-11-10 | Discharge: 2020-11-10 | Disposition: A | Discharge status: Home / Self Care | Payer: Medicare Other | Source: Ambulatory Visit | Attending: Nurse Practitioner | Admitting: Nurse Practitioner

## 2020-11-10 DIAGNOSIS — K7460 Unspecified cirrhosis of liver: Secondary | ICD-10-CM

## 2020-12-01 ENCOUNTER — Other Ambulatory Visit: Payer: Self-pay | Admitting: Thoracic Surgery (Cardiothoracic Vascular Surgery)

## 2020-12-01 DIAGNOSIS — C3490 Malignant neoplasm of unspecified part of unspecified bronchus or lung: Secondary | ICD-10-CM

## 2020-12-01 NOTE — Progress Notes (Signed)
ct 

## 2020-12-14 ENCOUNTER — Other Ambulatory Visit: Payer: Self-pay

## 2020-12-14 ENCOUNTER — Inpatient Hospital Stay: Payer: Medicare Other | Attending: Internal Medicine

## 2020-12-14 DIAGNOSIS — Z885 Allergy status to narcotic agent status: Secondary | ICD-10-CM | POA: Diagnosis not present

## 2020-12-14 DIAGNOSIS — D649 Anemia, unspecified: Secondary | ICD-10-CM | POA: Insufficient documentation

## 2020-12-14 DIAGNOSIS — C3431 Malignant neoplasm of lower lobe, right bronchus or lung: Secondary | ICD-10-CM

## 2020-12-14 DIAGNOSIS — I2510 Atherosclerotic heart disease of native coronary artery without angina pectoris: Secondary | ICD-10-CM | POA: Insufficient documentation

## 2020-12-14 DIAGNOSIS — Z79899 Other long term (current) drug therapy: Secondary | ICD-10-CM | POA: Diagnosis not present

## 2020-12-14 DIAGNOSIS — I10 Essential (primary) hypertension: Secondary | ICD-10-CM | POA: Insufficient documentation

## 2020-12-14 DIAGNOSIS — J449 Chronic obstructive pulmonary disease, unspecified: Secondary | ICD-10-CM | POA: Insufficient documentation

## 2020-12-14 DIAGNOSIS — R590 Localized enlarged lymph nodes: Secondary | ICD-10-CM | POA: Diagnosis not present

## 2020-12-14 DIAGNOSIS — E119 Type 2 diabetes mellitus without complications: Secondary | ICD-10-CM | POA: Insufficient documentation

## 2020-12-14 DIAGNOSIS — R059 Cough, unspecified: Secondary | ICD-10-CM | POA: Diagnosis not present

## 2020-12-14 DIAGNOSIS — R0609 Other forms of dyspnea: Secondary | ICD-10-CM | POA: Diagnosis not present

## 2020-12-14 DIAGNOSIS — R5383 Other fatigue: Secondary | ICD-10-CM | POA: Diagnosis not present

## 2020-12-14 DIAGNOSIS — Z886 Allergy status to analgesic agent status: Secondary | ICD-10-CM | POA: Insufficient documentation

## 2020-12-14 LAB — CBC WITH DIFFERENTIAL (CANCER CENTER ONLY)
Abs Immature Granulocytes: 0.02 10*3/uL (ref 0.00–0.07)
Basophils Absolute: 0.1 10*3/uL (ref 0.0–0.1)
Basophils Relative: 1 %
Eosinophils Absolute: 0.2 10*3/uL (ref 0.0–0.5)
Eosinophils Relative: 4 %
HCT: 30.2 % — ABNORMAL LOW (ref 36.0–46.0)
Hemoglobin: 8.9 g/dL — ABNORMAL LOW (ref 12.0–15.0)
Immature Granulocytes: 0 %
Lymphocytes Relative: 19 %
Lymphs Abs: 1 10*3/uL (ref 0.7–4.0)
MCH: 23.8 pg — ABNORMAL LOW (ref 26.0–34.0)
MCHC: 29.5 g/dL — ABNORMAL LOW (ref 30.0–36.0)
MCV: 80.7 fL (ref 80.0–100.0)
Monocytes Absolute: 0.7 10*3/uL (ref 0.1–1.0)
Monocytes Relative: 13 %
Neutro Abs: 3.2 10*3/uL (ref 1.7–7.7)
Neutrophils Relative %: 63 %
Platelet Count: 264 10*3/uL (ref 150–400)
RBC: 3.74 MIL/uL — ABNORMAL LOW (ref 3.87–5.11)
RDW: 18.1 % — ABNORMAL HIGH (ref 11.5–15.5)
WBC Count: 5.2 10*3/uL (ref 4.0–10.5)
nRBC: 0 % (ref 0.0–0.2)

## 2020-12-14 LAB — CMP (CANCER CENTER ONLY)
ALT: 17 U/L (ref 0–44)
AST: 45 U/L — ABNORMAL HIGH (ref 15–41)
Albumin: 3.1 g/dL — ABNORMAL LOW (ref 3.5–5.0)
Alkaline Phosphatase: 113 U/L (ref 38–126)
Anion gap: 10 (ref 5–15)
BUN: 11 mg/dL (ref 8–23)
CO2: 23 mmol/L (ref 22–32)
Calcium: 9.3 mg/dL (ref 8.9–10.3)
Chloride: 103 mmol/L (ref 98–111)
Creatinine: 1.09 mg/dL — ABNORMAL HIGH (ref 0.44–1.00)
GFR, Estimated: 57 mL/min — ABNORMAL LOW (ref >60)
Glucose, Bld: 159 mg/dL — ABNORMAL HIGH (ref 70–99)
Potassium: 4.8 mmol/L (ref 3.5–5.1)
Sodium: 136 mmol/L (ref 135–145)
Total Bilirubin: 2 mg/dL — ABNORMAL HIGH (ref 0.3–1.2)
Total Protein: 7.6 g/dL (ref 6.5–8.1)

## 2020-12-16 ENCOUNTER — Inpatient Hospital Stay (HOSPITAL_BASED_OUTPATIENT_CLINIC_OR_DEPARTMENT_OTHER): Payer: Medicare Other | Admitting: Internal Medicine

## 2020-12-16 ENCOUNTER — Telehealth: Payer: Self-pay | Admitting: Internal Medicine

## 2020-12-16 ENCOUNTER — Other Ambulatory Visit: Payer: Self-pay | Admitting: Internal Medicine

## 2020-12-16 ENCOUNTER — Other Ambulatory Visit: Payer: Self-pay

## 2020-12-16 ENCOUNTER — Inpatient Hospital Stay: Payer: Medicare Other

## 2020-12-16 VITALS — BP 122/88 | HR 91 | Temp 99.0°F | Resp 22 | Ht 63.0 in | Wt 134.0 lb

## 2020-12-16 DIAGNOSIS — C3431 Malignant neoplasm of lower lobe, right bronchus or lung: Secondary | ICD-10-CM

## 2020-12-16 DIAGNOSIS — D539 Nutritional anemia, unspecified: Secondary | ICD-10-CM

## 2020-12-16 DIAGNOSIS — D508 Other iron deficiency anemias: Secondary | ICD-10-CM

## 2020-12-16 LAB — IRON AND TIBC
Iron: 25 ug/dL — ABNORMAL LOW (ref 41–142)
Saturation Ratios: 6 % — ABNORMAL LOW (ref 21–57)
TIBC: 421 ug/dL (ref 236–444)
UIBC: 396 ug/dL — ABNORMAL HIGH (ref 120–384)

## 2020-12-16 LAB — FERRITIN: Ferritin: 14 ng/mL (ref 11–307)

## 2020-12-16 LAB — VITAMIN B12: Vitamin B-12: 749 pg/mL (ref 180–914)

## 2020-12-16 LAB — FOLATE: Folate: 7.3 ng/mL (ref >5.9)

## 2020-12-16 MED ORDER — METHYLPREDNISOLONE 4 MG PO TBPK: ORAL_TABLET | ORAL | 0 refills | Status: DC

## 2020-12-16 MED ORDER — INTEGRA PLUS PO CAPS: 1.0000 | ORAL_CAPSULE | Freq: Every day | ORAL | 4 refills | Status: DC

## 2020-12-16 NOTE — Progress Notes (Signed)
[facility] [phone]   Fax:([phone]  OFFICE PROGRESS NOTE  [name], MD [place] [address]  DIAGNOSIS: stage IIIA (T2a, N2, M0)non-small cell lung cancer, adenocarcinoma presented with right lower lobe lung mass and mediastinal lymphadenopathy. Diagnosed in May 2020.   Biomarker Findings Microsatellite status - MS-Stable Tumor Mutational Burden - 9 Muts/Mb Genomic Findings For a complete list of the genes assayed, please refer to the Appendix. NF1 K33* TP53 R158P 8 Disease relevant genes with no reportable alterations: ALK, BRAF, EGFR, ERBB2, KRAS, MET, RET, ROS1  PDL 1 expression negative  PRIOR THERAPY:   1) Status post right lower lobectomy with lymph node dissection on Jan 07, 2019 under the care of Dr. Roxan Hockey. 2) Adjuvant platinum based regimen with cisplatin 75 mg/M2 and Alimta 500 mg/M2 every 3 weeks. First dose on June 30th, 2020.  Status post 4 cycles. 3) Adjuvant radiotherapy to the mediastinum under the care of Dr. Lisbeth Renshaw.  First fraction on May 23, 2019.   CURRENT THERAPY:  Observation.  INTERVAL HISTORY: Sandy [name] 65 y.o. female returns to the clinic today for 42-monthfollow-up visit.  The patient continues to complain of persistent cough and shortness of breath.  She was treated in the past with prednisone and other cough medication with no improvement.  This is partially secondary to multi factorial problem including COPD, coronary artery disease as well as the radiation pneumonitis.  She denied having any chest pain or hemoptysis.  She denied having any fever or chills.  She has no nausea, vomiting, diarrhea or constipation.  She was supposed to have repeat CT scan of the chest before this visit but unfortunately it is scheduled to be done on Jan 01, 2021.  MEDICAL HISTORY: Past Medical History:  Diagnosis Date  . Adenocarcinoma of lung, stage 3, right ([place] 2020  .  Arthritis   . Asthma   . Cirrhosis ([place]   . COPD ([place] obstructive pulmonary disease) ([place]   . Dyslipidemia   . Dyspnea   . Esophageal varices ([place]   . History of blood transfusion   . History of hiatal hernia   . Hypertension   . Pneumonia   . Type 2 diabetes mellitus ([facility])    Type II    ALLERGIES:  is allergic to codeine and nsaids.  MEDICATIONS:  Current Outpatient Medications  Medication Sig Dispense Refill  . atorvastatin (LIPITOR) 10 MG tablet Take 10 mg by mouth at bedtime.     .Marland Kitchenlevothyroxine (SYNTHROID) 25 MCG tablet     . losartan (COZAAR) 100 MG tablet Take 1 tablet by mouth daily.    . metFORMIN (GLUCOPHAGE-XR) 500 MG 24 hr tablet Take 500 mg by mouth at bedtime.    . nadolol (CORGARD) 20 MG tablet Take 20 mg by mouth daily.    .Marland Kitchenomeprazole (PRILOSEC) 40 MG capsule Take 30- 60 min before your first and last meals of the day 60 capsule 11  . spironolactone (ALDACTONE) 25 MG tablet TAKE 1 TABLET(25 MG) BY MOUTH IN THE MORNING 90 tablet 0   No current facility-administered medications for this visit.    SURGICAL HISTORY:  Past Surgical History:  Procedure Laterality Date  . ABDOMINAL HYSTERECTOMY    . ANTERIOR CRUCIATE LIGAMENT REPAIR Left    x 2  . ESOPHAGOGASTRODUODENOSCOPY    . LOBECTOMY Right 01/07/2019   Procedure: RIGHT LOWER LOBE LOBECTOMY AND INTERCOSTAL NERVE BLOCK;  Surgeon: HModesto Charon  C, MD;  Location: [facility];  Service: Thoracic;  Laterality: Right;  Marland Kitchen VIDEO ASSISTED THORACOSCOPY (VATS)/WEDGE RESECTION Right 01/07/2019   Procedure: VIDEO ASSISTED THORACOSCOPY (VATS) RIGHT LOWER LOBE WEDGE RESECTION;  Surgeon: Melrose Nakayama, MD;  [facility];  Service: Thoracic;  Laterality: Right;    REVIEW OF SYSTEMS:  A comprehensive review of systems was negative except for: Constitutional: positive for fatigue Respiratory: positive for cough and dyspnea on exertion   PHYSICAL EXAMINATION: General appearance: alert, cooperative, fatigued  and no distress Head: Normocephalic, without obvious abnormality, atraumatic Neck: no adenopathy, no JVD, supple, symmetrical, trachea midline and thyroid not enlarged, symmetric, no tenderness/mass/nodules Lymph nodes: Cervical, supraclavicular, and axillary nodes normal. Resp: clear to auscultation bilaterally Back: symmetric, no curvature. ROM normal. No CVA tenderness. Cardio: regular rate and rhythm, S1, S2 normal, no murmur, click, rub or gallop GI: soft, non-tender; bowel sounds normal; no masses,  no organomegaly Extremities: extremities normal, atraumatic, no cyanosis or edema  ECOG PERFORMANCE STATUS: 1 - Symptomatic but completely ambulatory  Blood pressure 122/88, pulse 91, temperature 99 F (37.2 C), temperature source Tympanic, resp. rate (!) 22, height _0  (1.6 m), weight 134 lb (60.8 kg), SpO2 100 %.  LABORATORY DATA: Lab Results  Component Value Date   WBC 5.2 12/14/2020   HGB 8.9 (L) 12/14/2020   HCT 30.2 (L) 12/14/2020   MCV 80.7 12/14/2020   PLT 264 12/14/2020      Chemistry      Component Value Date/Time   NA 136 12/14/2020 1108   NA 138 10/20/2020 1507   K 4.8 12/14/2020 1108   CL 103 12/14/2020 1108   CO2 23 12/14/2020 1108   BUN 11 12/14/2020 1108   BUN 6 (L) 10/20/2020 1507   CREATININE 1.09 (H) 12/14/2020 1108      Component Value Date/Time   CALCIUM 9.3 12/14/2020 1108   ALKPHOS 113 12/14/2020 1108   AST 45 (H) 12/14/2020 1108   ALT 17 12/14/2020 1108   BILITOT 2.0 (H) 12/14/2020 1108       RADIOGRAPHIC STUDIES: No results found.  ASSESSMENT AND PLAN: This is a very pleasant 65 years old white female with stage IIIA non-small cell lung cancer, adenocarcinoma with no actionable mutations status post right lower lobectomy with lymph node dissection under the care of Dr. Roxan Hockey on Jan 07, 2019. The patient completed adjuvant systemic chemotherapy with cisplatin and Alimta status post 4 cycles. This was also followed by adjuvant  radiotherapy to the mediastinum completed in October 2020.   The patient is currently on observation and she continues to have persistent cough and shortness of breath. She is a scheduled to have repeat CT scan of the chest in 2 weeks followed by a visit with Dr. Roxan Hockey. I recommended for the patient to continue on observation unless his scan showed any concerning findings. For the shortness of breath and cough, I will give her prescription for Medrol Dosepak. For the anemia I will arrange for the patient to have anemia panel today to identify the etiology of her anemia. She was advised to call immediately if she has any other concerning symptoms in the interval. The patient voices understanding of current disease status and treatment options and is in agreement with the current care plan. All questions were answered. The patient knows to call the clinic with any problems, questions or concerns. We can certainly see the patient much sooner if necessary.   Disclaimer: This note was dictated with voice recognition software.  Similar sounding words can inadvertently be transcribed and may not be corrected upon review.

## 2020-12-16 NOTE — Telephone Encounter (Signed)
Scheduled follow-up appointments per 4/27 los. Patient is aware.

## 2020-12-17 ENCOUNTER — Telehealth: Payer: Self-pay | Admitting: Physician Assistant

## 2020-12-17 LAB — ERYTHROPOIETIN: Erythropoietin: 550.3 m[IU]/mL — ABNORMAL HIGH (ref 2.6–18.5)

## 2020-12-17 NOTE — Telephone Encounter (Signed)
I called the patient to let Sandy [name] know that Dr. Julien Nordmann would like Sandy [name] to start taking an iron supplement. I left a voicemail that a prescription has been sent to Sandy [name] pharmacy. If she has questions, I left our call back number.

## 2021-01-01 ENCOUNTER — Ambulatory Visit
Admission: RE | Admit: 2021-01-01 | Discharge: 2021-01-01 | Disposition: A | Discharge status: Home / Self Care | Payer: Medicare Other | Source: Ambulatory Visit | Attending: Thoracic Surgery (Cardiothoracic Vascular Surgery) | Admitting: Thoracic Surgery (Cardiothoracic Vascular Surgery)

## 2021-01-01 DIAGNOSIS — C3490 Malignant neoplasm of unspecified part of unspecified bronchus or lung: Secondary | ICD-10-CM

## 2021-01-05 ENCOUNTER — Encounter: Payer: Self-pay | Admitting: Thoracic Surgery (Cardiothoracic Vascular Surgery)

## 2021-01-05 ENCOUNTER — Other Ambulatory Visit: Payer: Self-pay

## 2021-01-05 ENCOUNTER — Ambulatory Visit (INDEPENDENT_AMBULATORY_CARE_PROVIDER_SITE_OTHER): Payer: Medicare Other | Admitting: Thoracic Surgery (Cardiothoracic Vascular Surgery)

## 2021-01-05 VITALS — BP 134/77 | HR 103 | Resp 20 | Ht 63.0 in | Wt 134.0 lb

## 2021-01-05 DIAGNOSIS — Z902 Acquired absence of lung [part of]: Secondary | ICD-10-CM

## 2021-01-05 MED ORDER — BENZONATATE 100 MG PO CAPS: 100.0000 mg | ORAL_CAPSULE | Freq: Three times a day (TID) | ORAL | 1 refills | Status: DC | PRN

## 2021-01-05 NOTE — Progress Notes (Signed)
[address]       [place], [address]             [phone]     HPI: Sandy [name] returns for scheduled follow-up visit  Sandy [name] is a 65 year old woman with a history of tobacco abuse, type 2 diabetes, cirrhosis, varices, hypertension, hyperlipidemia, arthritis, and lung cancer.  She had a right lower lobectomy on 01/07/2019 for a T2N2 stage IIIa adenocarcinoma.  She was treated with postoperative adjuvant chemotherapy and radiation.  Last saw her in November 2021.  She was doing well at that time but still had a persistent cough.  She developed a cough after she had radiation.  She is been having problems with that ever since.  That continues to wax and wane but is never completely resolved.  Currently it is worse.  She recently had a Medrol Dosepak prescribed by Dr. Julien Nordmann, but that did not help.  Past Medical History:  Diagnosis Date  . Adenocarcinoma of lung, stage 3, right ([place]) 2020  . Arthritis   . Asthma   . Cirrhosis ([place])   . COPD (chronic obstructive pulmonary disease) ([place])   . Dyslipidemia   . Dyspnea   . Esophageal varices ([place])   . History of blood transfusion   . History of hiatal hernia   . Hypertension   . Pneumonia   . Type 2 diabetes mellitus ([facility])    Type II    Current Outpatient Medications  Medication Sig Dispense Refill  . atorvastatin (LIPITOR) 10 MG tablet Take 10 mg by mouth at bedtime.     . benzonatate (TESSALON) 100 MG capsule Take 1 capsule (100 mg total) by mouth 3 (three) times daily as needed for cough. 30 capsule 1  . FeFum-FePoly-FA-B Cmp-C-Biot (INTEGRA PLUS) CAPS Take 1 capsule by mouth daily. 30 capsule 4  . levothyroxine (SYNTHROID) 25 MCG tablet     . losartan (COZAAR) 100 MG tablet Take 1 tablet by mouth daily.    . metFORMIN (GLUCOPHAGE-XR) 500 MG 24 hr tablet Take 500 mg by mouth at bedtime.    . methylPREDNISolone (MEDROL DOSEPAK) 4 MG TBPK tablet Use as instructed 21 tablet 0  . nadolol (CORGARD) 20 MG  tablet Take 20 mg by mouth daily.    Marland Kitchen omeprazole (PRILOSEC) 40 MG capsule Take 30- 60 min before your first and last meals of the day 60 capsule 11  . spironolactone (ALDACTONE) 25 MG tablet TAKE 1 TABLET(25 MG) BY MOUTH IN THE MORNING 90 tablet 0   No current facility-administered medications for this visit.    Physical Exam BP 134/77 (BP Location: Right Arm, Patient Position: Sitting)   Pulse (!) 103   Resp 20   Ht 5\' 3"  (1.6 m)   Wt 134 lb (60.8 kg)   SpO2 98% Comment: RA  BMI 23.46 kg/m  65 year old woman in no acute distress, but coughs when talking Alert and oriented x3 with no focal deficits No cervical or supraclavicular adenopathy Cardiac regular rate and rhythm Lungs diminished right base, frequent cough, no wheezing  Diagnostic Tests: CT CHEST WITHOUT CONTRAST  TECHNIQUE: Multidetector CT imaging of the chest was performed following the standard protocol without IV contrast.  COMPARISON:  Chest CT dated 06/12/2020.  FINDINGS: Cardiovascular: No thoracic aortic aneurysm. No pericardial effusion. Three-vessel coronary artery calcifications. Aortic atherosclerosis.  Mediastinum/Nodes: No mass or enlarged lymph nodes are seen within the mediastinum. Esophagus is unremarkable. Trachea is unremarkable.  Lungs/Pleura: Stable RIGHT  paramediastinal fibrosis and architectural distortion compatible with history of partial lung resection and radiation therapy. No evidence of cancer recurrence. No new findings within either lung. Stable emphysematous changes bilaterally, mild to moderate in degree, upper lobe predominant. No pleural effusion or pneumothorax.  Upper Abdomen: Cirrhotic-appearing liver. Associated splenomegaly. Bilateral renal cysts, as previously described.  Musculoskeletal: No acute or suspicious osseous abnormality.  IMPRESSION: 1. Stable postsurgical and post radiation changes of the RIGHT lung. No evidence of cancer recurrence. 2. No  acute findings. 3. Cirrhotic-appearing liver with associated splenomegaly.  Aortic Atherosclerosis (ICD10-I70.0) and Emphysema (ICD10-J43.9).   Electronically Signed   By: Franki Cabot M.D.   On: 01/01/2021 14:29 I personally reviewed the chest x-ray images.  There are postoperative and postradiation changes.  No evidence of recurrent disease.  Known cirrhosis.  Impression: Sandy [name] is a 65 year old woman with a history of tobacco abuse, type 2 diabetes, cirrhosis, varices, hypertension, hyperlipidemia, arthritis, and lung cancer.   Stage IIIA (T2, N2) adenocarcinoma of the right lower lobe.  She is now 2 years out from a right lower lobectomy followed by adjuvant chemotherapy and radiation.  She has no evidence of recurrent disease.  She has a follow-up with Dr. Julien Nordmann in October with another CT.  Cough-persistent cough that developed after adjuvant radiation therapy.  He has never resolved completely.  Previously been steroid responsive but did not respond to recent Medrol Dosepak.  We discussed trying a narcotic cough suppressant but she has problems with nausea with codeine.  Over-the-counter cough medications have not been effective.  We will try Tessalon to see if that helps.  Plan: Tessalon 100 mg p.o. 3 times daily as needed for cough, 30 tablets, 1 refill Recommended she follow-up with Dr. Melvyn Novas Follow-up as scheduled with Dr. Julien Nordmann I will see her back in 6 months.  I will defer ordering the scan to Dr. Julien Nordmann.  Melrose Nakayama, MD [facility] [phone]

## 2021-01-07 IMAGING — CT NUCLEAR MEDICINE PET IMAGE INITIAL (PI) SKULL BASE TO THIGH
8 series · 16 of 16 positions shown · non-contrast
Comparison: Chest CTs, most recent 10/23/2018

CLINICAL DATA: Initial treatment strategy for chest CT
demonstrating a superior segment right lower lobe lesion.

EXAM:
NUCLEAR MEDICINE PET SKULL BASE TO THIGH
TECHNIQUE: 9.3 mCi F-18 FDG was injected intravenously. Full-ring PET imaging
was performed from the skull base to thigh after the radiotracer. CT
data was obtained and used for attenuation correction and anatomic
localization.
Fasting blood glucose: 93 mg/dl

[Series 3: pet sk_thigh ac · axial · 5.0mm · 4.07mm/px · z∈[-921,+3]mm · 3 of 232 slices shown]
[im 1/232]
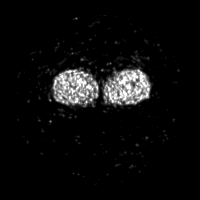
[im 116/232]
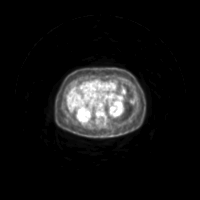
[im 232/232]
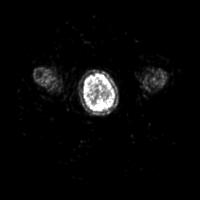

[Series 4: ct sk_thigh 5.0 b31f · axial · 0.98mm/px · z∈[-921,+3]mm · 3 of 232 slices shown]
[im 1/232  soft-tissue]
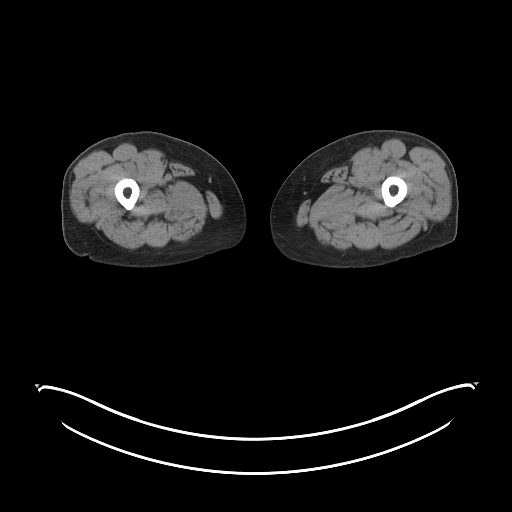
[im 116/232  soft-tissue]
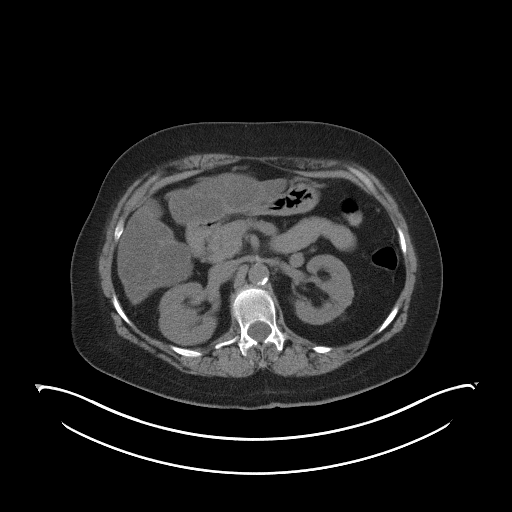
[im 232/232  soft-tissue]
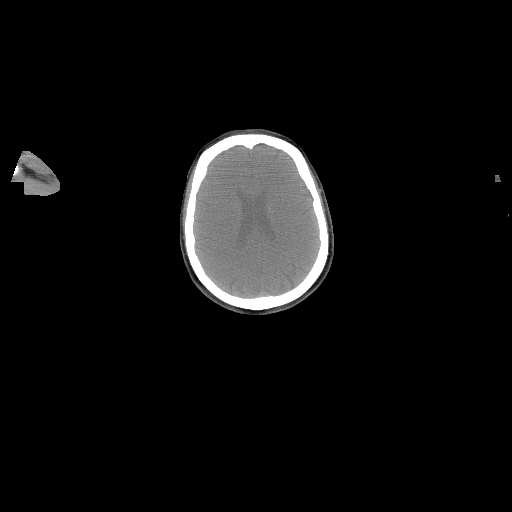

[Series 5: pet sk_thigh nac · axial · 5.0mm · 4.07mm/px · z∈[-921,+3]mm · 3 of 232 slices shown]
[im 1/232]
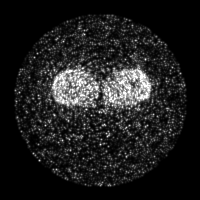
[im 116/232]
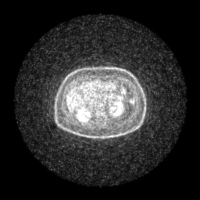
[im 232/232]
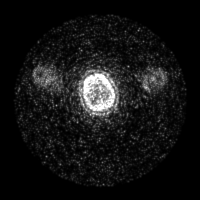

[Series 8: ct sk_thigh 5.0 b70f lung_bone · axial · 0.71mm/px · 1 of 67 slices shown]
[im 1/67  soft-tissue]
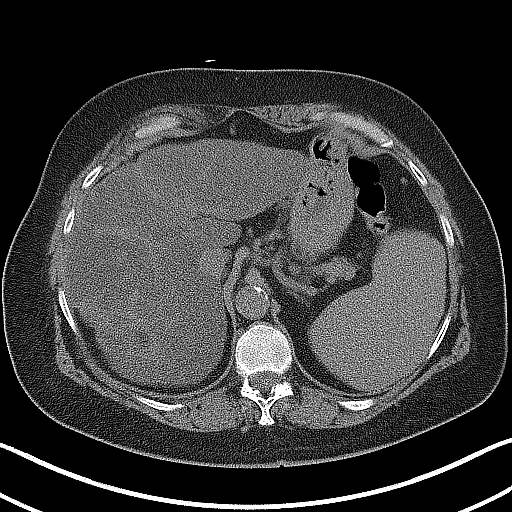

[Series 603: mip range · coronal · 1.92mm/px · 1 of 32 slices shown]
[im 1/32]
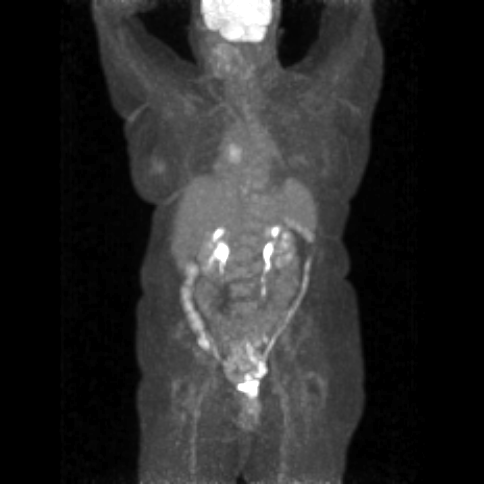

[Series 604: range-ct sk_thigh 5.0 (id)<alpha range> · 1 of 72 slices shown (1 of 2)]
[im 1/72]
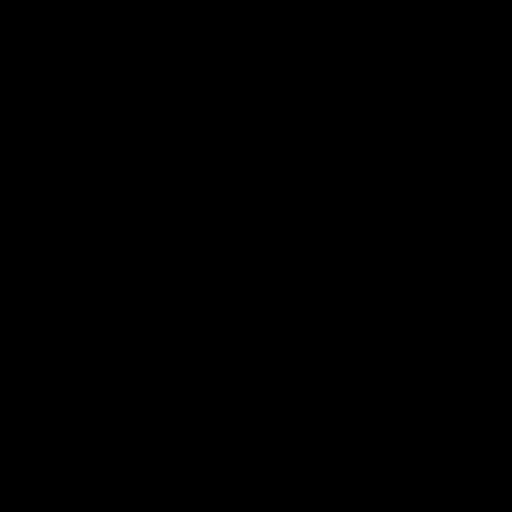

[Series 605: range-ct sk_thigh 5.0 (id)<alpha range> · 3 of 220 slices shown (2 of 2)]
[im 1/220]
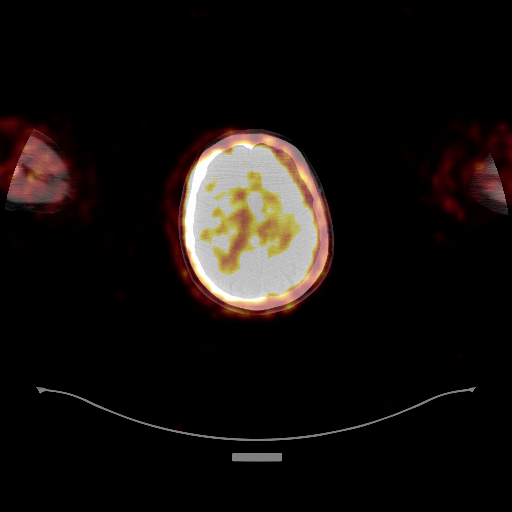
[im 110/220]
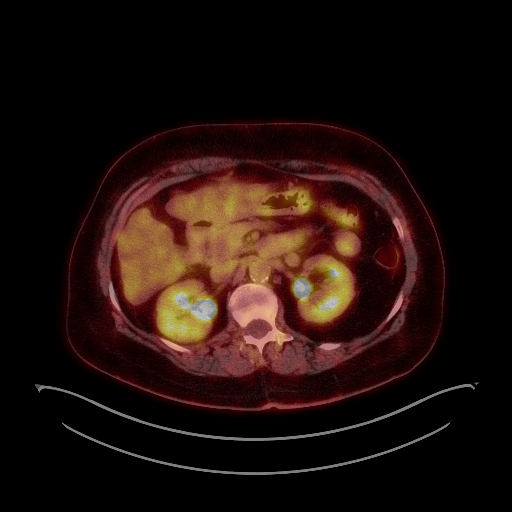
[im 220/220]
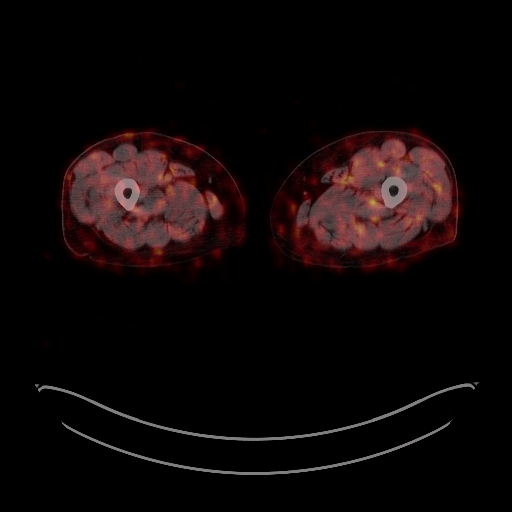

[Series 1072: results mm oncology reading · 3.0mm · 0.98mm/px · 1 of 2 slices shown]
[im 1/2]
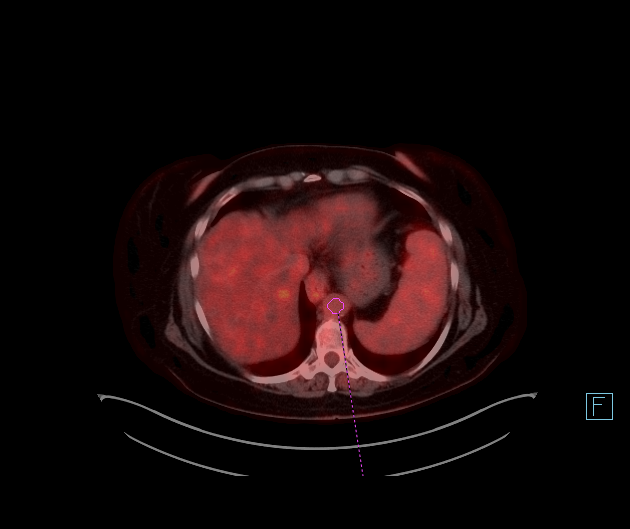

[16 of 16 positions shown; findings below may reference images not displayed]

FINDINGS: Mediastinal blood pool activity: SUV max

NECK: No areas of abnormal hypermetabolism.

Incidental CT findings: Bilateral carotid atherosclerosis. No
cervical adenopathy.

CHEST: Hypermetabolism corresponding to the superior segment right
lower lobe lung mass. This measures 3.5 x 2.4 cm and a S.U.V. max of
7.6 on image 38/8. No thoracic nodal hypermetabolism.

Incidental CT findings: Tiny hiatal hernia. Aortic and coronary
artery atherosclerosis, including within the LAD. Moderate
centrilobular emphysema.

ABDOMEN/PELVIS: No abdominopelvic parenchymal or nodal
hypermetabolism.

Incidental CT findings: Normal adrenal glands. Low-density right
renal lesions which are likely cysts. Complex left renal lesions,
including up to 1.2 cm on image 115/4.

Advanced cirrhosis and hepatic steatosis. Abdominal aortic
atherosclerosis. Hysterectomy.

SKELETON: No abnormal marrow activity.

Incidental CT findings: none
IMPRESSION: 1. Hypermetabolic superior segment right lower lobe lung mass,
consistent with primary bronchogenic carcinoma. Presuming
non-small-cell histology, most consistent with JTa1OBO or stage IB.
2. Cirrhosis and hepatic steatosis
3. Indeterminate left renal lesions which could represent complex
cysts or solid neoplasms. Consider dedicated pre and post contrast
abdominal MRI.
4. Age advanced coronary artery atherosclerosis. Recommend
assessment of coronary risk factors and consideration of medical
therapy.
5. Aortic atherosclerosis (5B1XR-X9J.J) and emphysema (5B1XR-GWH.5).

## 2021-02-22 IMAGING — CR CHEST - 2 VIEW
2 series · 2 of 2 positions shown · non-contrast
Comparison: January 10, 2019 chest radiograph; chest CT October 23, 2018

CLINICAL DATA: Lung carcinoma, status post VATS procedure.
Shortness of breath.

EXAM:
CHEST - 2 VIEW

[w chest pa]
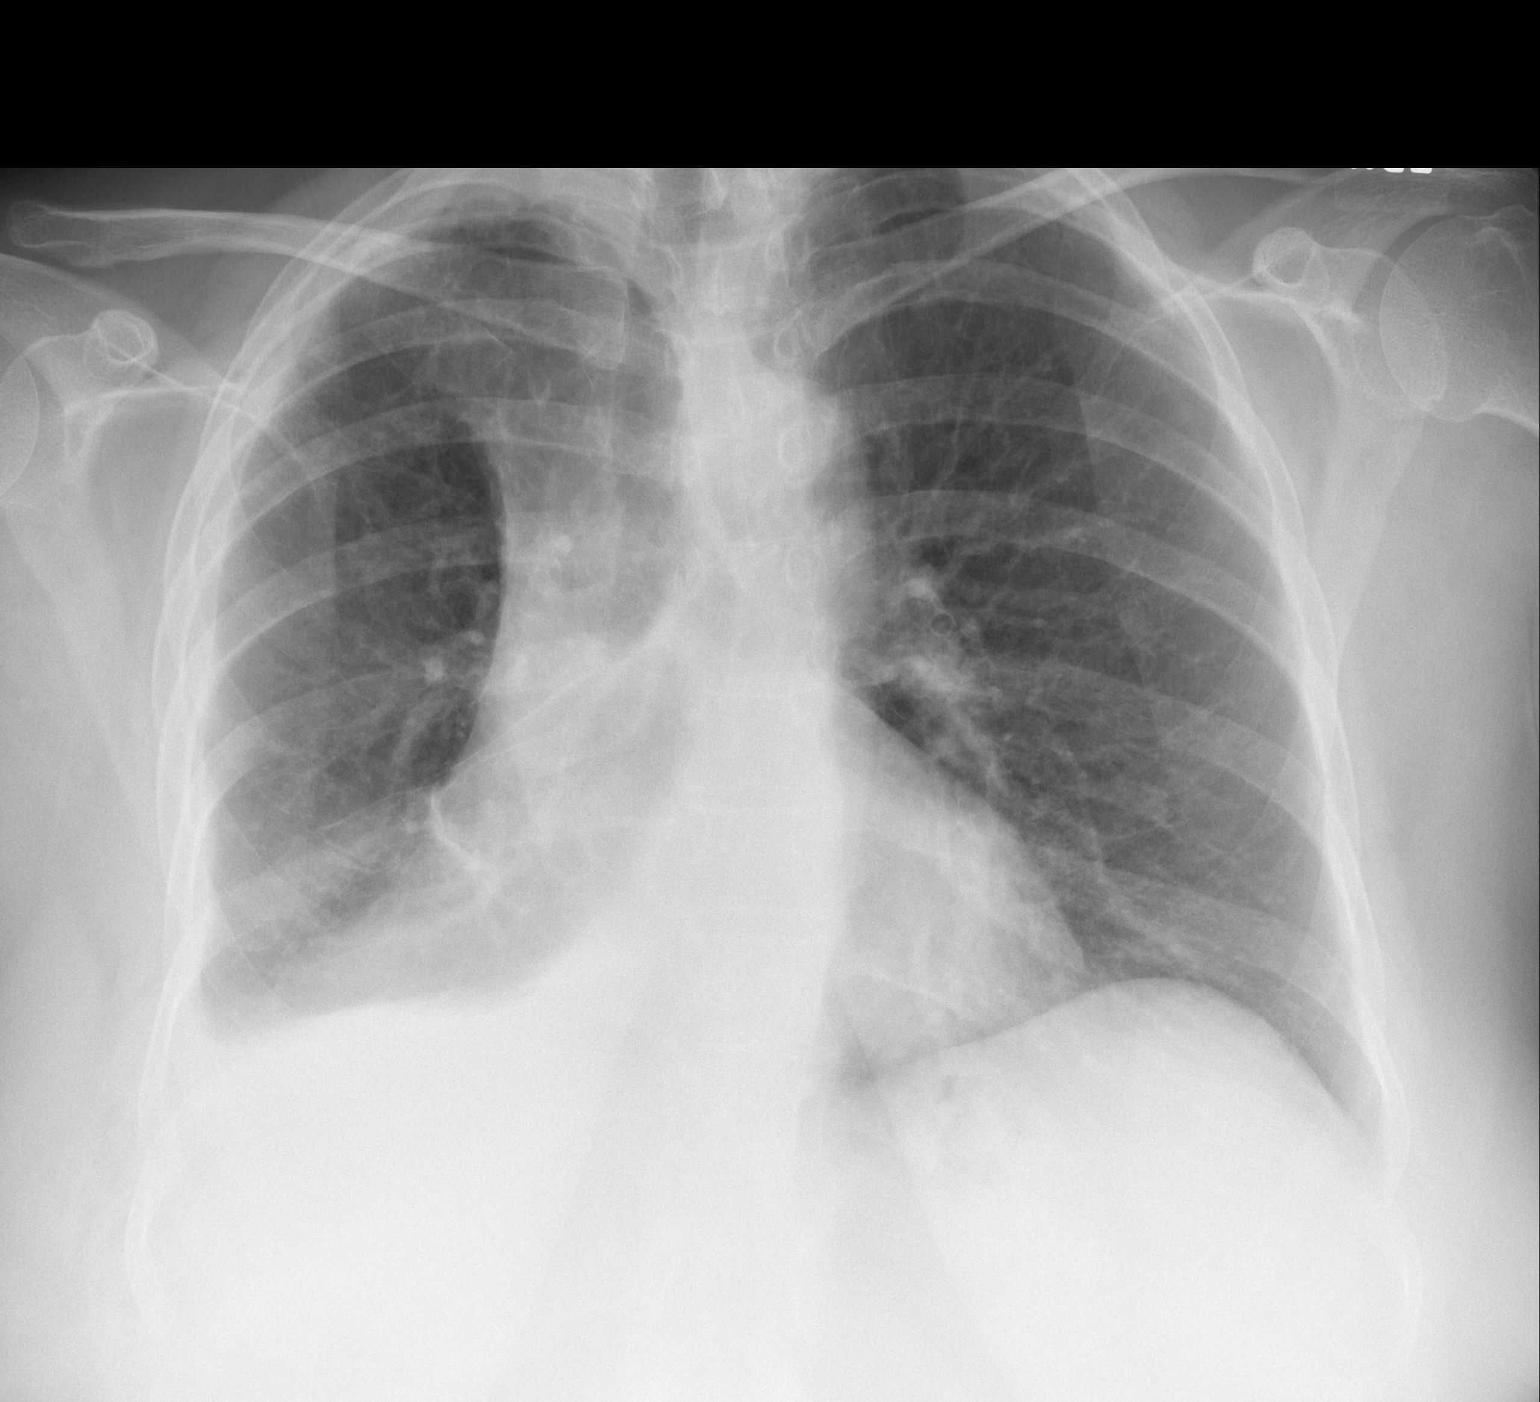

[w chest lat]
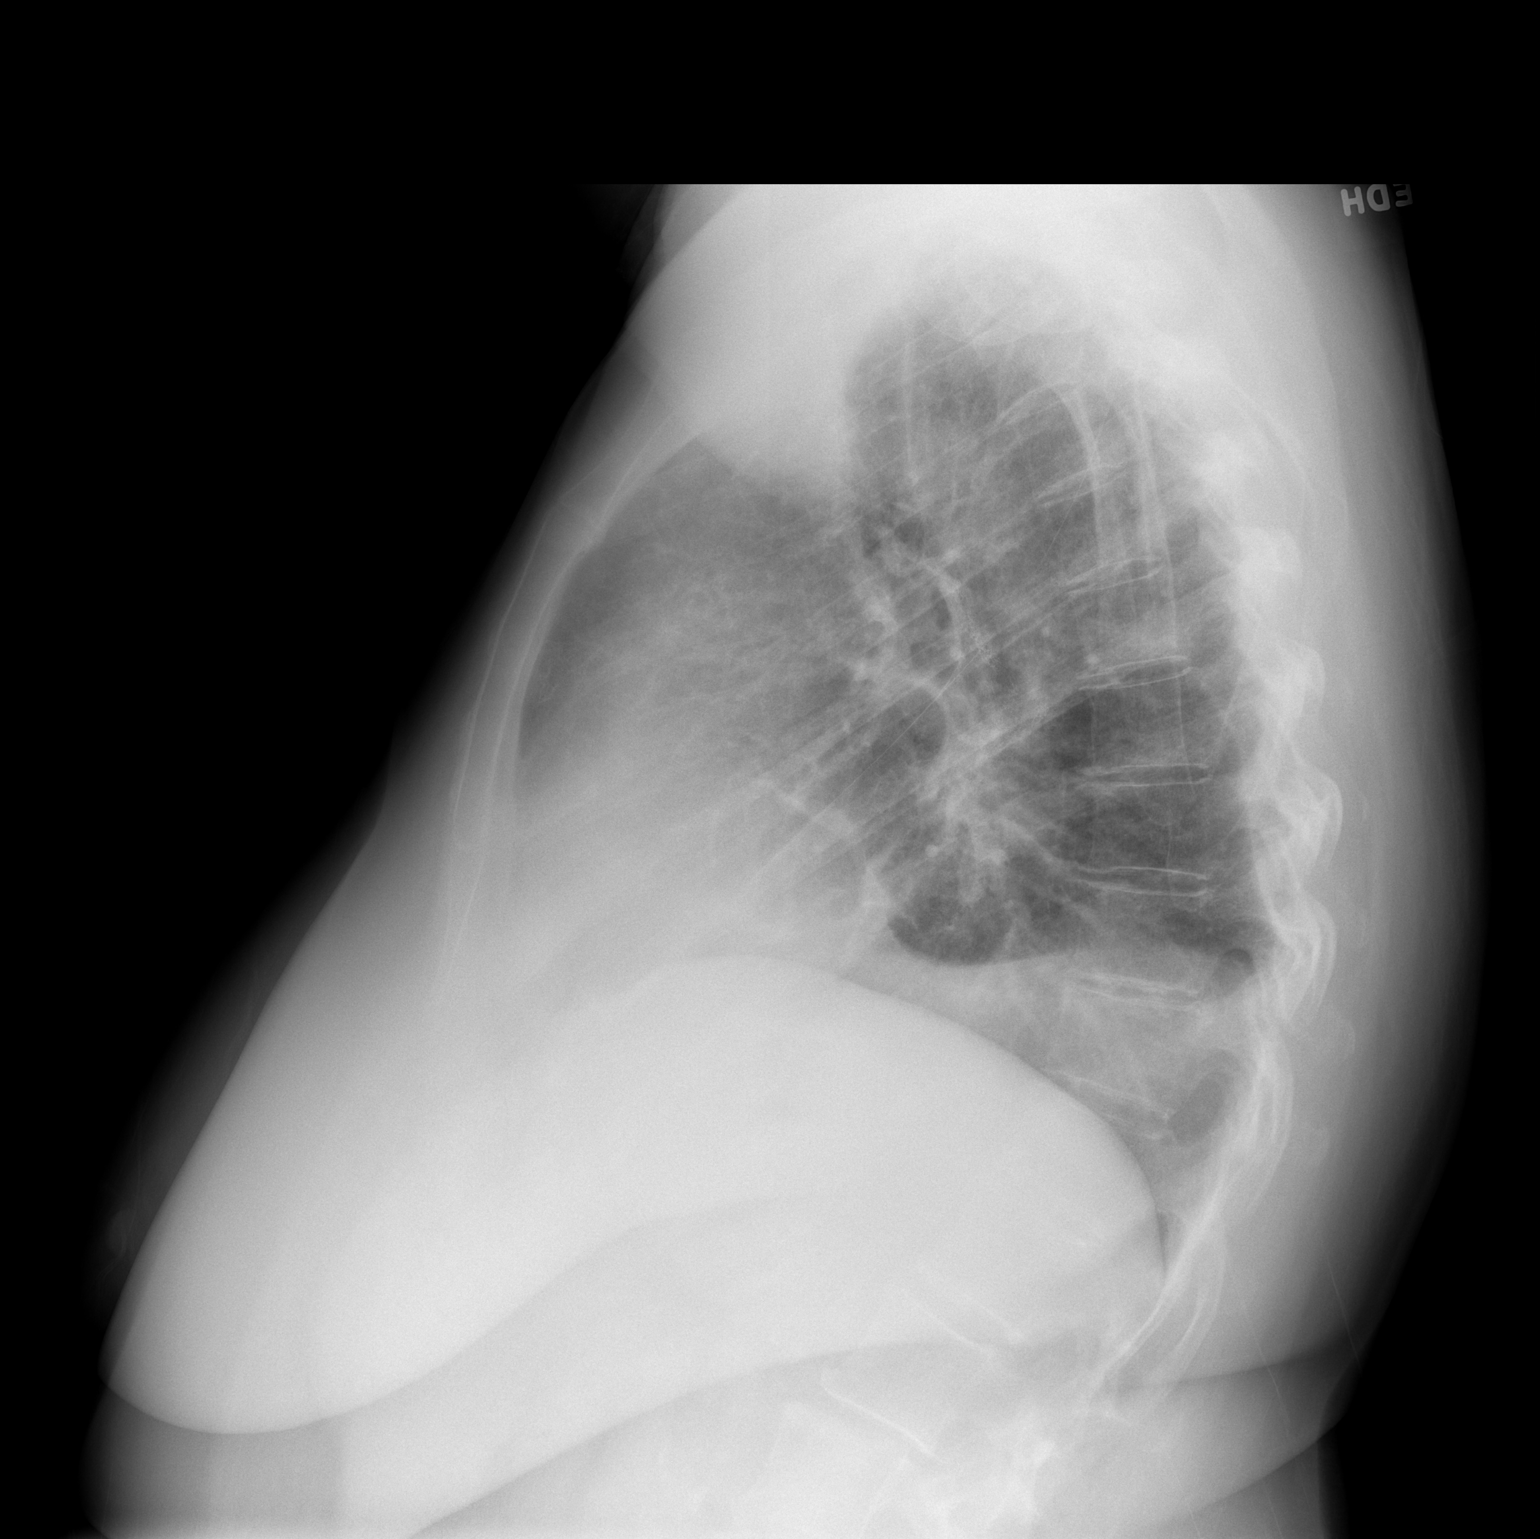

[2 of 2 positions shown; findings below may reference images not displayed]

FINDINGS: There are postoperative changes on the right with small right
pleural effusion. There are areas of volume loss on the right, at
least in part due to postoperative change. The left lung is clear.
The heart size and pulmonary vascularity are within normal limits.
No adenopathy. No bone lesions.
IMPRESSION: Postoperative change with volume loss on the right. Small right
pleural effusion. Left lung clear. Heart size within normal limits.
No evident adenopathy. No recurrent mass is demonstrable by
radiography.

## 2021-03-05 ENCOUNTER — Emergency Department (HOSPITAL_COMMUNITY)
Admission: EM | Admit: 2021-03-05 | Discharge: 2021-03-05 | Disposition: A | Discharge status: Home / Self Care | Payer: Medicare Other | Attending: Emergency Medicine | Admitting: Emergency Medicine

## 2021-03-05 ENCOUNTER — Emergency Department (HOSPITAL_COMMUNITY): Payer: Medicare Other

## 2021-03-05 ENCOUNTER — Other Ambulatory Visit: Payer: Self-pay

## 2021-03-05 ENCOUNTER — Encounter (HOSPITAL_COMMUNITY): Payer: Self-pay

## 2021-03-05 DIAGNOSIS — E785 Hyperlipidemia, unspecified: Secondary | ICD-10-CM | POA: Insufficient documentation

## 2021-03-05 DIAGNOSIS — Z79899 Other long term (current) drug therapy: Secondary | ICD-10-CM | POA: Diagnosis not present

## 2021-03-05 DIAGNOSIS — I10 Essential (primary) hypertension: Secondary | ICD-10-CM | POA: Diagnosis not present

## 2021-03-05 DIAGNOSIS — R1110 Vomiting, unspecified: Secondary | ICD-10-CM | POA: Insufficient documentation

## 2021-03-05 DIAGNOSIS — E1169 Type 2 diabetes mellitus with other specified complication: Secondary | ICD-10-CM | POA: Insufficient documentation

## 2021-03-05 DIAGNOSIS — E876 Hypokalemia: Secondary | ICD-10-CM

## 2021-03-05 DIAGNOSIS — R112 Nausea with vomiting, unspecified: Secondary | ICD-10-CM

## 2021-03-05 DIAGNOSIS — Z85118 Personal history of other malignant neoplasm of bronchus and lung: Secondary | ICD-10-CM | POA: Diagnosis not present

## 2021-03-05 DIAGNOSIS — Z7984 Long term (current) use of oral hypoglycemic drugs: Secondary | ICD-10-CM | POA: Insufficient documentation

## 2021-03-05 DIAGNOSIS — Z9221 Personal history of antineoplastic chemotherapy: Secondary | ICD-10-CM | POA: Diagnosis not present

## 2021-03-05 DIAGNOSIS — J45909 Unspecified asthma, uncomplicated: Secondary | ICD-10-CM | POA: Diagnosis not present

## 2021-03-05 DIAGNOSIS — Z87891 Personal history of nicotine dependence: Secondary | ICD-10-CM | POA: Insufficient documentation

## 2021-03-05 DIAGNOSIS — R1084 Generalized abdominal pain: Secondary | ICD-10-CM | POA: Diagnosis not present

## 2021-03-05 DIAGNOSIS — J449 Chronic obstructive pulmonary disease, unspecified: Secondary | ICD-10-CM | POA: Diagnosis not present

## 2021-03-05 LAB — CBC
HCT: 37.3 % (ref 36.0–46.0)
Hemoglobin: 11.4 g/dL — ABNORMAL LOW (ref 12.0–15.0)
MCH: 29 pg (ref 26.0–34.0)
MCHC: 30.6 g/dL (ref 30.0–36.0)
MCV: 94.9 fL (ref 80.0–100.0)
Platelets: 160 10*3/uL (ref 150–400)
RBC: 3.93 MIL/uL (ref 3.87–5.11)
RDW: 22.8 % — ABNORMAL HIGH (ref 11.5–15.5)
WBC: 4.4 10*3/uL (ref 4.0–10.5)
nRBC: 0 % (ref 0.0–0.2)

## 2021-03-05 LAB — URINALYSIS, ROUTINE W REFLEX MICROSCOPIC
Bilirubin Urine: NEGATIVE
Glucose, UA: NEGATIVE mg/dL
Ketones, ur: NEGATIVE mg/dL
Leukocytes,Ua: NEGATIVE
Nitrite: NEGATIVE
Protein, ur: NEGATIVE mg/dL
Specific Gravity, Urine: 1.014 (ref 1.005–1.030)
pH: 9 — ABNORMAL HIGH (ref 5.0–8.0)

## 2021-03-05 LAB — COMPREHENSIVE METABOLIC PANEL
ALT: 32 U/L (ref 0–44)
AST: 105 U/L — ABNORMAL HIGH (ref 15–41)
Albumin: 3.2 g/dL — ABNORMAL LOW (ref 3.5–5.0)
Alkaline Phosphatase: 105 U/L (ref 38–126)
Anion gap: 13 (ref 5–15)
BUN: 9 mg/dL (ref 8–23)
CO2: 29 mmol/L (ref 22–32)
Calcium: 9.2 mg/dL (ref 8.9–10.3)
Chloride: 95 mmol/L — ABNORMAL LOW (ref 98–111)
Creatinine, Ser: 0.68 mg/dL (ref 0.44–1.00)
GFR, Estimated: >60 mL/min (ref >60)
Glucose, Bld: 115 mg/dL — ABNORMAL HIGH (ref 70–99)
Potassium: 3.1 mmol/L — ABNORMAL LOW (ref 3.5–5.1)
Sodium: 137 mmol/L (ref 135–145)
Total Bilirubin: 5.1 mg/dL — ABNORMAL HIGH (ref 0.3–1.2)
Total Protein: 7.7 g/dL (ref 6.5–8.1)

## 2021-03-05 LAB — LIPASE, BLOOD: Lipase: 34 U/L (ref 11–51)

## 2021-03-05 MED ORDER — LORAZEPAM 2 MG/ML IJ SOLN
0.5000 mg | Freq: Once | INTRAMUSCULAR | Status: AC
  Administered 2021-03-05: 0.5 mg via INTRAVENOUS
  Filled 2021-03-05: qty 1

## 2021-03-05 MED ORDER — ONDANSETRON 8 MG PO TBDP: 8.0000 mg | ORAL_TABLET | Freq: Three times a day (TID) | ORAL | 0 refills | Status: DC | PRN

## 2021-03-05 MED ORDER — METOCLOPRAMIDE HCL 5 MG/ML IJ SOLN
10.0000 mg | Freq: Once | INTRAMUSCULAR | Status: AC
  Administered 2021-03-05: 10 mg via INTRAVENOUS
  Filled 2021-03-05: qty 2

## 2021-03-05 MED ORDER — ONDANSETRON 4 MG PO TBDP
4.0000 mg | ORAL_TABLET | Freq: Once | ORAL | Status: AC | PRN
  Administered 2021-03-05: 4 mg via ORAL
  Filled 2021-03-05: qty 1

## 2021-03-05 MED ORDER — IOHEXOL 350 MG/ML SOLN
100.0000 mL | Freq: Once | INTRAVENOUS | Status: AC | PRN
  Administered 2021-03-05: 80 mL via INTRAVENOUS

## 2021-03-05 MED ORDER — POTASSIUM CHLORIDE ER 10 MEQ PO TBCR: 10.0000 meq | EXTENDED_RELEASE_TABLET | Freq: Every day | ORAL | 0 refills | Status: DC

## 2021-03-05 MED ORDER — LACTATED RINGERS IV BOLUS
2000.0000 mL | Freq: Once | INTRAVENOUS | Status: AC
  Administered 2021-03-05: 2000 mL via INTRAVENOUS

## 2021-03-05 MED ORDER — LACTATED RINGERS IV SOLN: INTRAVENOUS | Status: DC

## 2021-03-05 MED ORDER — ONDANSETRON HCL 4 MG/2ML IJ SOLN
4.0000 mg | Freq: Once | INTRAMUSCULAR | Status: AC
  Administered 2021-03-05: 4 mg via INTRAVENOUS
  Filled 2021-03-05: qty 2

## 2021-03-05 MED ORDER — POTASSIUM CHLORIDE CRYS ER 20 MEQ PO TBCR
40.0000 meq | EXTENDED_RELEASE_TABLET | Freq: Once | ORAL | Status: AC
  Administered 2021-03-05: 40 meq via ORAL
  Filled 2021-03-05: qty 2

## 2021-03-05 NOTE — ED Triage Notes (Signed)
Patient c/o vomiting x 2-3 days ago. Patient denies any abdominal pain or diarrhea.

## 2021-03-05 NOTE — ED Notes (Signed)
Pt tolerated PO Challenge

## 2021-03-05 NOTE — ED Provider Notes (Signed)
[facility] DEPT Provider Note   CSN: [id number] Arrival date & time: 03/05/21  0932     History Chief Complaint  Patient presents with   Emesis    Sandy [name] is a 65 y.o. female.  65 year old female presents with 2 days of emesis.  States that this started after she had some food which she thinks might have been bad.  No fever or chills.  Emesis has been nonbilious and nonbloody.  No associated diarrhea.  No fever chills pain or urinary symptoms.  She has had diffuse abdominal cramping.  Does have a history of cirrhosis but denies any current alcohol use.  No treatment use prior to arrival      Past Medical History:  Diagnosis Date   Adenocarcinoma of lung, stage 3, right ([place]) 2020   Arthritis    Asthma    Cirrhosis ([place])    COPD (chronic obstructive pulmonary disease) ([facility])    Dyslipidemia    Dyspnea    Esophageal varices ([facility])    History of blood transfusion    History of hiatal hernia    Hypertension    Pneumonia    Type 2 diabetes mellitus ([place])    Type II    Patient Active Problem List   Diagnosis Date Noted   Chronic cough 06/15/2020   Upper airway cough syndrome 05/07/2020   Cough variant asthma vs RT pneumonitis  11/06/2019   DOE (dyspnea on exertion) 11/06/2019   Essential hypertension 11/06/2019   Hypomagnesemia 02/26/2019   Anemia 02/12/2019   Encounter for antineoplastic chemotherapy 01/17/2019   Goals of care, counseling/discussion 01/17/2019   Malignant neoplasm of bronchus of lower lobe, right ([place]) 01/10/2019   S/P lobectomy of lung 01/07/2019   Type 2 diabetes mellitus ([place]) 12/18/2018   Solitary pulmonary nodule on lung CT 11/10/2246   Alcoholic cirrhosis ([place]) 25/00/3704   History of esophageal varices 10/18/2017   De Quervain's tenosynovitis, right 04/17/2015    Past Surgical History:  Procedure Laterality Date   ABDOMINAL HYSTERECTOMY     ANTERIOR CRUCIATE LIGAMENT REPAIR Left    x 2    ESOPHAGOGASTRODUODENOSCOPY     LOBECTOMY Right 01/07/2019   Procedure: RIGHT LOWER LOBE LOBECTOMY AND INTERCOSTAL NERVE BLOCK;  Surgeon: Melrose Nakayama, MD;  Location: [facility];  Service: Thoracic;  Laterality: Right;   VIDEO ASSISTED THORACOSCOPY (VATS)/WEDGE RESECTION Right 01/07/2019   Procedure: VIDEO ASSISTED THORACOSCOPY (VATS) RIGHT LOWER LOBE WEDGE RESECTION;  Surgeon: Melrose Nakayama, MD;  Location: [facility] OR;  Service: Thoracic;  Laterality: Right;     OB History   No obstetric history on file.     Family History  Problem Relation Age of Onset   Leukemia Mother    Parkinson's disease Father    Hypertension Sister    Lung disease Neg Hx     Social History   Tobacco Use   Smoking status: Former    Packs/day: 1.00    Years: 37.00    Pack years: 37.00    Types: Cigarettes    Quit date: 08/2014    Years since quitting: 6.5   Smokeless tobacco: Never  Vaping Use   Vaping Use: Never used  Substance Use Topics   Alcohol use: Not Currently    Alcohol/week: 1.0 standard drink    Types: 1 Glasses of wine per week   Drug use: Not Currently    Types: Marijuana    Home Medications Prior to Admission medications   Medication Sig Start  Date End Date Taking? Authorizing Provider  atorvastatin (LIPITOR) 10 MG tablet Take 10 mg by mouth at bedtime.     [provider]  benzonatate (TESSALON) 100 MG capsule Take 1 capsule (100 mg total) by mouth 3 (three) times daily as needed for cough. 01/05/21   Melrose Nakayama, MD  FeFum-FePoly-FA-B Cmp-C-Biot (INTEGRA PLUS) CAPS Take 1 capsule by mouth daily. 12/16/20   Curt Bears, MD  levothyroxine (SYNTHROID) 25 MCG tablet  09/04/19   [provider]  losartan (COZAAR) 100 MG tablet Take 1 tablet by mouth daily. 02/12/20   [provider]  metFORMIN (GLUCOPHAGE-XR) 500 MG 24 hr tablet Take 500 mg by mouth at bedtime.    [provider]  methylPREDNISolone (MEDROL DOSEPAK) 4 MG TBPK tablet  Use as instructed 12/16/20   Curt Bears, MD  nadolol (CORGARD) 20 MG tablet Take 20 mg by mouth daily. 06/15/20   [provider]  omeprazole (PRILOSEC) 40 MG capsule Take 30- 60 min before your first and last meals of the day 05/06/20   Tanda Rockers, MD  spironolactone (ALDACTONE) 25 MG tablet TAKE 1 TABLET(25 MG) BY MOUTH IN THE MORNING 09/30/20   Tolia, Sunit, DO    Allergies    Codeine and Nsaids  Review of Systems   Review of Systems  All other systems reviewed and are negative.  Physical Exam Updated Vital Signs BP 111/71 (BP Location: Left Arm)   Pulse (!) 119   Resp (!) 22   Ht 1.6 m (5\' 3" )   Wt 59 kg   SpO2 94%   BMI 23.03 kg/m   Physical Exam Vitals and nursing note reviewed.  Constitutional:      General: She is not in acute distress.    Appearance: Normal appearance. She is well-developed. She is not toxic-appearing.  HENT:     Head: Normocephalic and atraumatic.  Eyes:     General: Lids are normal.     Conjunctiva/sclera: Conjunctivae normal.     Pupils: Pupils are equal, round, and reactive to light.  Neck:     Thyroid: No thyroid mass.     Trachea: No tracheal deviation.  Cardiovascular:     Rate and Rhythm: Normal rate and regular rhythm.     Heart sounds: Normal heart sounds. No murmur heard.   No gallop.  Pulmonary:     Effort: Pulmonary effort is normal. No respiratory distress.     Breath sounds: Normal breath sounds. No stridor. No decreased breath sounds, wheezing, rhonchi or rales.  Abdominal:     General: There is no distension.     Palpations: Abdomen is soft.     Tenderness: There is generalized abdominal tenderness. There is no guarding or rebound.  Musculoskeletal:        General: No tenderness. Normal range of motion.     Cervical back: Normal range of motion and neck supple.  Skin:    General: Skin is warm and dry.     Findings: No abrasion or rash.  Neurological:     Mental Status: She is alert and oriented to  person, place, and time. Mental status is at baseline.     GCS: GCS eye subscore is 4. GCS verbal subscore is 5. GCS motor subscore is 6.     Cranial Nerves: Cranial nerves are intact. No cranial nerve deficit.     Sensory: No sensory deficit.     Motor: Motor function is intact.  Psychiatric:  Attention and Perception: Attention normal.        Speech: Speech normal.        Behavior: Behavior normal.    ED Results / Procedures / Treatments   Labs (all labs ordered are listed, but only abnormal results are displayed) Labs Reviewed  LIPASE, BLOOD  COMPREHENSIVE METABOLIC PANEL  CBC  URINALYSIS, ROUTINE W REFLEX MICROSCOPIC    EKG None  Radiology No results found.  Procedures Procedures   Medications Ordered in ED Medications  lactated ringers infusion (has no administration in time range)  lactated ringers bolus 2,000 mL (has no administration in time range)  metoCLOPramide (REGLAN) injection 10 mg (has no administration in time range)  ondansetron (ZOFRAN-ODT) disintegrating tablet 4 mg (4 mg Oral Given 03/05/21 0951)    ED Course  I have reviewed the triage vital signs and the nursing notes.  Pertinent labs & imaging results that were available during my care of the patient were reviewed by me and considered in my medical decision making (see chart for details).    MDM Rules/Calculators/A&P                          Patient given IV high duration here.  Low potassium noted at 3.1.  Bilirubin elevated at 5.  Suspect this is from her underlying disease.  Urinalysis negative for infection.  Given oral supplementation.  Abdominal CT shows finding assistant with known history of cirrhosis.  Patient follow-up with her doctor Final Clinical Impression(s) / ED Diagnoses Final diagnoses:  None    Rx / DC Orders ED Discharge Orders     None        Lacretia Leigh, MD 03/05/21 1444

## 2021-03-30 ENCOUNTER — Ambulatory Visit: Payer: Medicare Other | Admitting: Cardiology

## 2021-04-26 IMAGING — CR CHEST - 2 VIEW
2 series · 2 of 2 positions shown · non-contrast
Comparison: 01/22/2019

CLINICAL DATA: 62-year-old with right lung cancer and status post
right lower lobe wedge resection.

EXAM:
CHEST - 2 VIEW

[w chest pa]
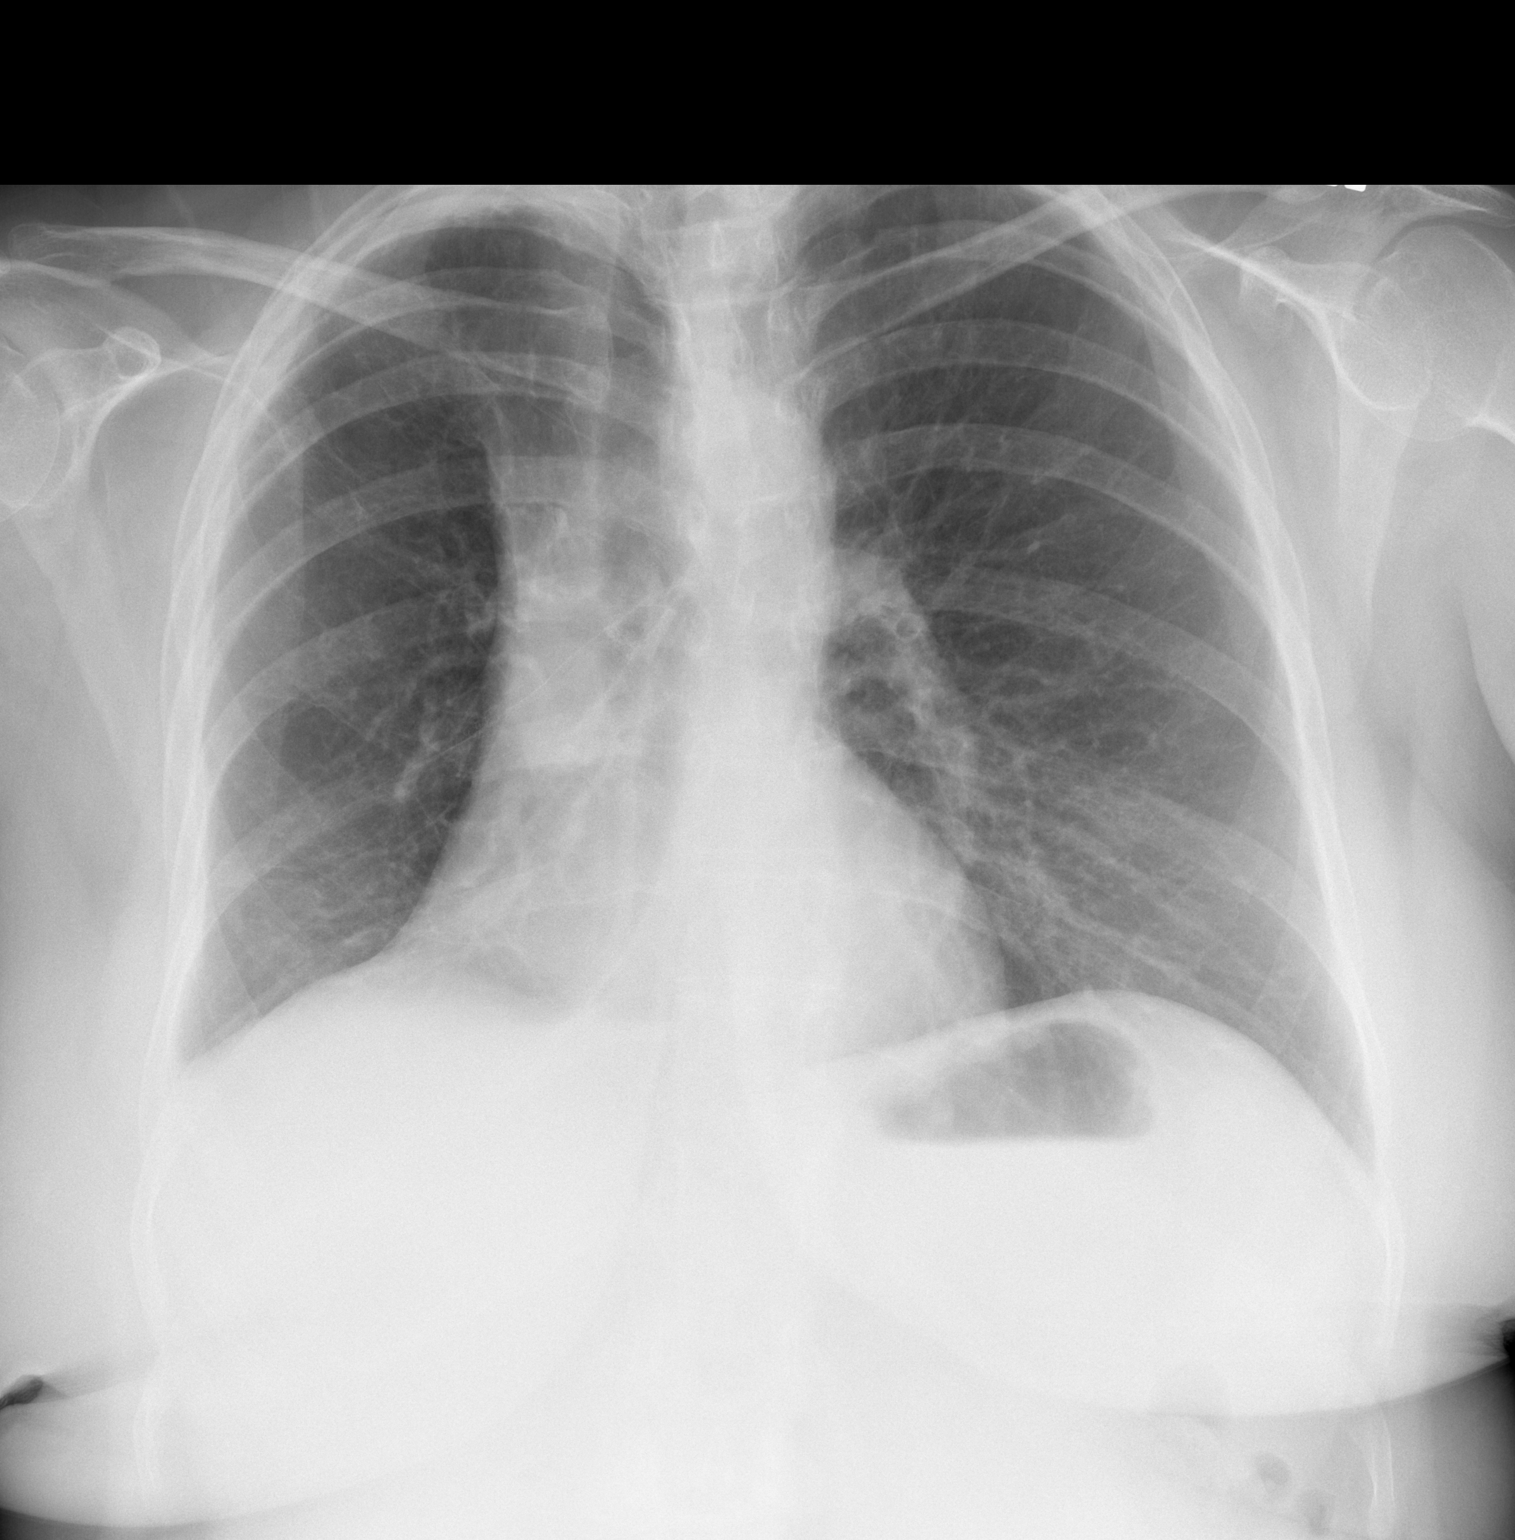

[w chest lat]
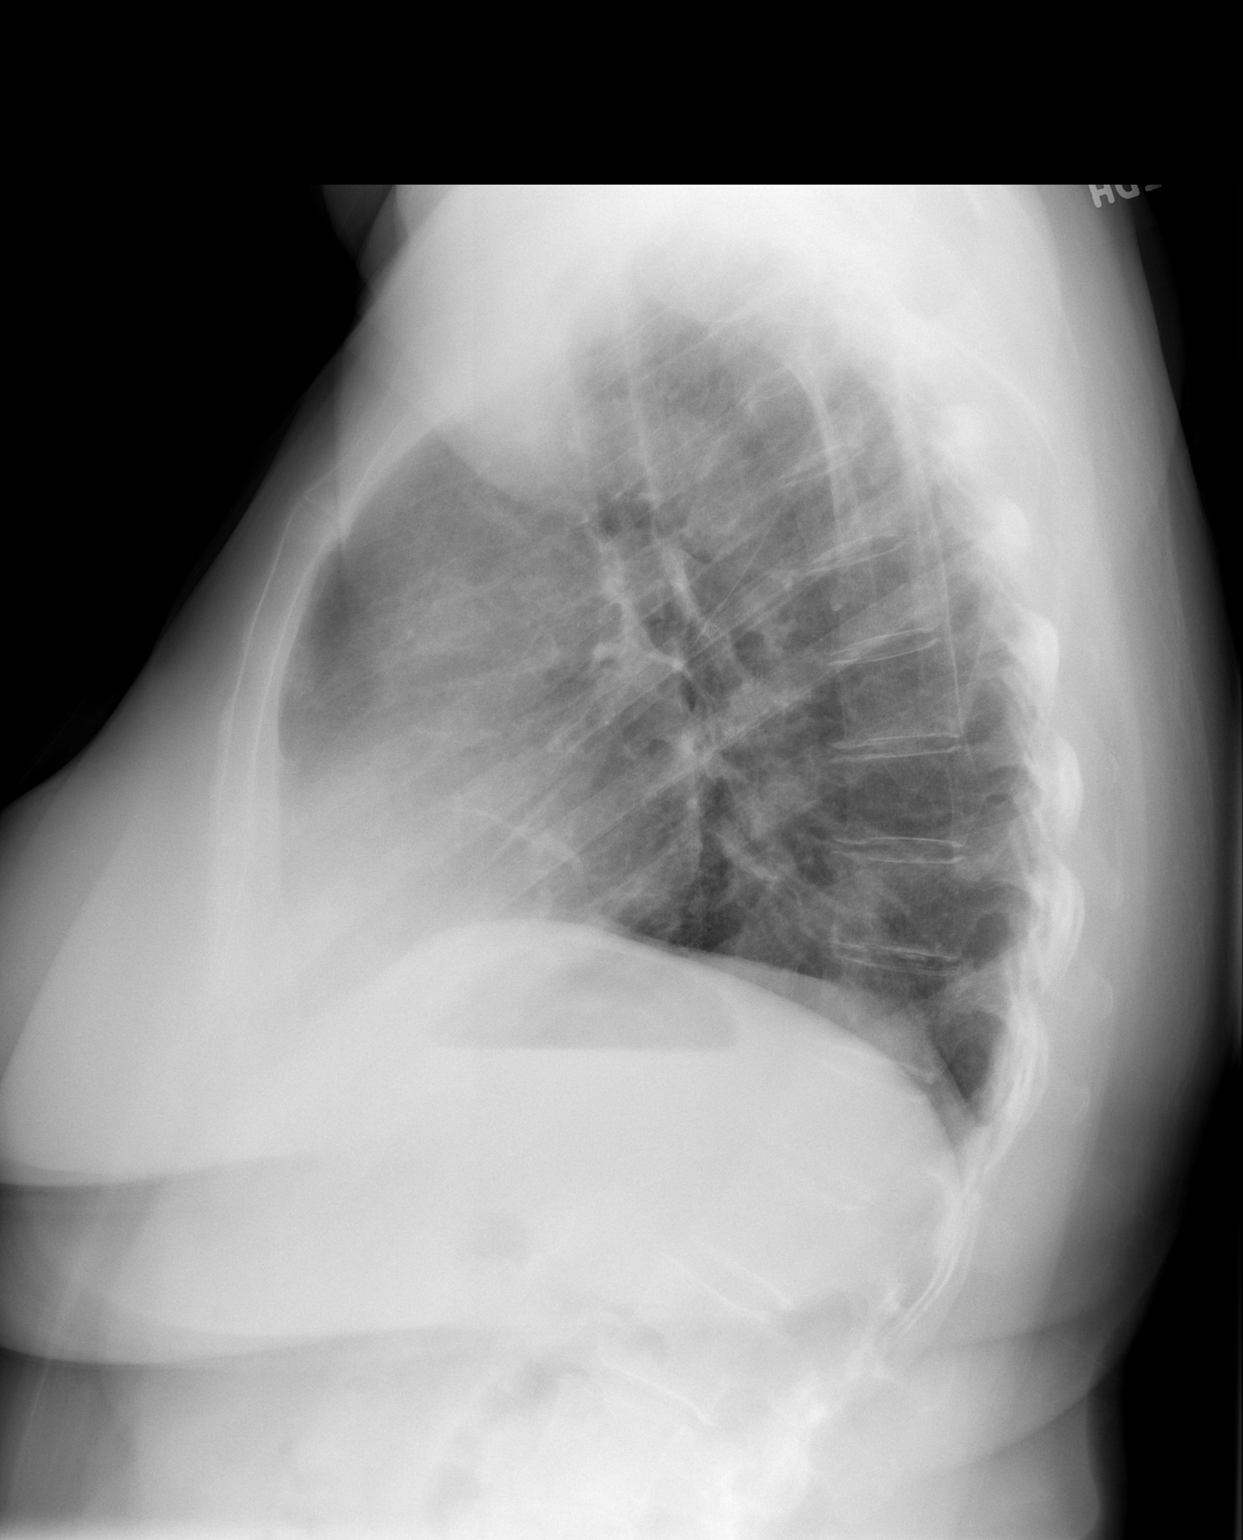

[2 of 2 positions shown; findings below may reference images not displayed]

FINDINGS: Stable volume loss in right hemithorax compatible with prior
surgery. Stable mediastinal shift towards the right. Left lung is
clear. Negative for a pneumothorax. No significant pleural fluid.
Heart size is normal and stable.
IMPRESSION: 1. No acute chest findings.
2. Volume loss in the right hemithorax compatible with prior
surgery.

## 2021-06-03 ENCOUNTER — Other Ambulatory Visit (HOSPITAL_COMMUNITY): Payer: Self-pay | Admitting: Nurse Practitioner

## 2021-06-03 DIAGNOSIS — K8050 Calculus of bile duct without cholangitis or cholecystitis without obstruction: Secondary | ICD-10-CM

## 2021-06-04 ENCOUNTER — Other Ambulatory Visit: Payer: Self-pay | Admitting: Nurse Practitioner

## 2021-06-04 DIAGNOSIS — K7030 Alcoholic cirrhosis of liver without ascites: Secondary | ICD-10-CM

## 2021-06-04 DIAGNOSIS — K769 Liver disease, unspecified: Secondary | ICD-10-CM

## 2021-06-15 ENCOUNTER — Inpatient Hospital Stay: Payer: Medicare Other | Attending: Internal Medicine

## 2021-06-15 ENCOUNTER — Other Ambulatory Visit: Payer: Self-pay

## 2021-06-15 ENCOUNTER — Other Ambulatory Visit: Payer: Self-pay | Admitting: Internal Medicine

## 2021-06-15 DIAGNOSIS — K7030 Alcoholic cirrhosis of liver without ascites: Secondary | ICD-10-CM | POA: Diagnosis not present

## 2021-06-15 DIAGNOSIS — C3431 Malignant neoplasm of lower lobe, right bronchus or lung: Secondary | ICD-10-CM | POA: Insufficient documentation

## 2021-06-15 DIAGNOSIS — Z885 Allergy status to narcotic agent status: Secondary | ICD-10-CM | POA: Insufficient documentation

## 2021-06-15 DIAGNOSIS — D539 Nutritional anemia, unspecified: Secondary | ICD-10-CM

## 2021-06-15 DIAGNOSIS — Z7952 Long term (current) use of systemic steroids: Secondary | ICD-10-CM | POA: Insufficient documentation

## 2021-06-15 DIAGNOSIS — D509 Iron deficiency anemia, unspecified: Secondary | ICD-10-CM | POA: Insufficient documentation

## 2021-06-15 DIAGNOSIS — E119 Type 2 diabetes mellitus without complications: Secondary | ICD-10-CM | POA: Diagnosis not present

## 2021-06-15 DIAGNOSIS — I10 Essential (primary) hypertension: Secondary | ICD-10-CM | POA: Insufficient documentation

## 2021-06-15 DIAGNOSIS — Z79899 Other long term (current) drug therapy: Secondary | ICD-10-CM | POA: Insufficient documentation

## 2021-06-15 DIAGNOSIS — Z886 Allergy status to analgesic agent status: Secondary | ICD-10-CM | POA: Insufficient documentation

## 2021-06-15 LAB — CBC WITH DIFFERENTIAL (CANCER CENTER ONLY)
Abs Immature Granulocytes: 0.02 10*3/uL (ref 0.00–0.07)
Basophils Absolute: 0 10*3/uL (ref 0.0–0.1)
Basophils Relative: 1 %
Eosinophils Absolute: 0.2 10*3/uL (ref 0.0–0.5)
Eosinophils Relative: 5 %
HCT: 24.1 % — ABNORMAL LOW (ref 36.0–46.0)
Hemoglobin: 7 g/dL — ABNORMAL LOW (ref 12.0–15.0)
Immature Granulocytes: 1 %
Lymphocytes Relative: 26 %
Lymphs Abs: 0.8 10*3/uL (ref 0.7–4.0)
MCH: 27.7 pg (ref 26.0–34.0)
MCHC: 29 g/dL — ABNORMAL LOW (ref 30.0–36.0)
MCV: 95.3 fL (ref 80.0–100.0)
Monocytes Absolute: 0.5 10*3/uL (ref 0.1–1.0)
Monocytes Relative: 16 %
Neutro Abs: 1.6 10*3/uL — ABNORMAL LOW (ref 1.7–7.7)
Neutrophils Relative %: 51 %
Platelet Count: 193 10*3/uL (ref 150–400)
RBC: 2.53 MIL/uL — ABNORMAL LOW (ref 3.87–5.11)
RDW: 16 % — ABNORMAL HIGH (ref 11.5–15.5)
WBC Count: 3.2 10*3/uL — ABNORMAL LOW (ref 4.0–10.5)
nRBC: 0 % (ref 0.0–0.2)

## 2021-06-15 LAB — CMP (CANCER CENTER ONLY)
ALT: 16 U/L (ref 0–44)
AST: 44 U/L — ABNORMAL HIGH (ref 15–41)
Albumin: 2.5 g/dL — ABNORMAL LOW (ref 3.5–5.0)
Alkaline Phosphatase: 97 U/L (ref 38–126)
Anion gap: 11 (ref 5–15)
BUN: 5 mg/dL — ABNORMAL LOW (ref 8–23)
CO2: 19 mmol/L — ABNORMAL LOW (ref 22–32)
Calcium: 8.7 mg/dL — ABNORMAL LOW (ref 8.9–10.3)
Chloride: 108 mmol/L (ref 98–111)
Creatinine: 0.78 mg/dL (ref 0.44–1.00)
GFR, Estimated: >60 mL/min (ref >60)
Glucose, Bld: 115 mg/dL — ABNORMAL HIGH (ref 70–99)
Potassium: 4.1 mmol/L (ref 3.5–5.1)
Sodium: 138 mmol/L (ref 135–145)
Total Bilirubin: 2.5 mg/dL — ABNORMAL HIGH (ref 0.3–1.2)
Total Protein: 6.9 g/dL (ref 6.5–8.1)

## 2021-06-16 ENCOUNTER — Other Ambulatory Visit: Payer: Self-pay | Admitting: Medical Oncology

## 2021-06-16 DIAGNOSIS — D539 Nutritional anemia, unspecified: Secondary | ICD-10-CM

## 2021-06-16 NOTE — Progress Notes (Signed)
Pt notified of low hgb and orders for blood transfusion this week.  Schedule message sent for lab tomorrow and 2 units of blood this week.

## 2021-06-17 ENCOUNTER — Inpatient Hospital Stay (HOSPITAL_BASED_OUTPATIENT_CLINIC_OR_DEPARTMENT_OTHER): Payer: Medicare Other | Admitting: Internal Medicine

## 2021-06-17 ENCOUNTER — Other Ambulatory Visit: Payer: Self-pay | Admitting: Medical Oncology

## 2021-06-17 ENCOUNTER — Inpatient Hospital Stay: Payer: Medicare Other

## 2021-06-17 ENCOUNTER — Other Ambulatory Visit: Payer: Self-pay

## 2021-06-17 VITALS — BP 116/50 | HR 92 | Temp 96.5°F | Resp 18 | Ht 63.0 in | Wt 132.4 lb

## 2021-06-17 DIAGNOSIS — D500 Iron deficiency anemia secondary to blood loss (chronic): Secondary | ICD-10-CM | POA: Diagnosis not present

## 2021-06-17 DIAGNOSIS — C3490 Malignant neoplasm of unspecified part of unspecified bronchus or lung: Secondary | ICD-10-CM | POA: Diagnosis not present

## 2021-06-17 DIAGNOSIS — D539 Nutritional anemia, unspecified: Secondary | ICD-10-CM

## 2021-06-17 DIAGNOSIS — C3431 Malignant neoplasm of lower lobe, right bronchus or lung: Secondary | ICD-10-CM | POA: Diagnosis not present

## 2021-06-17 DIAGNOSIS — D508 Other iron deficiency anemias: Secondary | ICD-10-CM

## 2021-06-17 LAB — RETICULOCYTES
Immature Retic Fract: 17.4 % — ABNORMAL HIGH (ref 2.3–15.9)
RBC.: 2.48 MIL/uL — ABNORMAL LOW (ref 3.87–5.11)
Retic Count, Absolute: 87.5 10*3/uL (ref 19.0–186.0)
Retic Ct Pct: 3.5 % — ABNORMAL HIGH (ref 0.4–3.1)

## 2021-06-17 LAB — IRON AND TIBC
Iron: 17 ug/dL — ABNORMAL LOW (ref 41–142)
Saturation Ratios: 5 % — ABNORMAL LOW (ref 21–57)
TIBC: 335 ug/dL (ref 236–444)
UIBC: 318 ug/dL (ref 120–384)

## 2021-06-17 LAB — FERRITIN: Ferritin: 26 ng/mL (ref 11–307)

## 2021-06-17 LAB — SAMPLE TO BLOOD BANK

## 2021-06-17 LAB — LACTATE DEHYDROGENASE: LDH: 249 U/L — ABNORMAL HIGH (ref 98–192)

## 2021-06-17 LAB — PREPARE RBC (CROSSMATCH)

## 2021-06-17 LAB — VITAMIN B12: Vitamin B-12: 1038 pg/mL — ABNORMAL HIGH (ref 180–914)

## 2021-06-17 LAB — FOLATE: Folate: 20.7 ng/mL (ref >5.9)

## 2021-06-17 MED ORDER — DIPHENHYDRAMINE HCL 25 MG PO CAPS
25.0000 mg | ORAL_CAPSULE | Freq: Once | ORAL | Status: AC
  Administered 2021-06-17: 25 mg via ORAL
  Filled 2021-06-17: qty 1

## 2021-06-17 MED ORDER — SODIUM CHLORIDE 0.9% IV SOLUTION
250.0000 mL | Freq: Once | INTRAVENOUS | Status: AC
  Administered 2021-06-17: 250 mL via INTRAVENOUS

## 2021-06-17 MED ORDER — ACETAMINOPHEN 325 MG PO TABS
650.0000 mg | ORAL_TABLET | Freq: Once | ORAL | Status: AC
  Administered 2021-06-17: 650 mg via ORAL
  Filled 2021-06-17: qty 2

## 2021-06-17 NOTE — Patient Instructions (Signed)
Blood Transfusion, Adult, Care After This sheet gives you information about how to care for yourself after your procedure. Your doctor may also give you more specific instructions. If you have problems or questions, contact your doctor. What can I expect after the procedure? After the procedure, it is common to have: Bruising and soreness at the IV site. A headache. Follow these instructions at home: Insertion site care   Follow instructions from your doctor about how to take care of your insertion site. This is where an IV tube was put into your vein. Make sure you: Wash your hands with soap and water before and after you change your bandage (dressing). If you cannot use soap and water, use hand sanitizer. Change your bandage as told by your doctor. Check your insertion site every day for signs of infection. Check for: Redness, swelling, or pain. Bleeding from the site. Warmth. Pus or a bad smell. General instructions Take over-the-counter and prescription medicines only as told by your doctor. Rest as told by your doctor. Go back to your normal activities as told by your doctor. Keep all follow-up visits as told by your doctor. This is important. Contact a doctor if: You have itching or red, swollen areas of skin (hives). You feel worried or nervous (anxious). You feel weak after doing your normal activities. You have redness, swelling, warmth, or pain around the insertion site. You have blood coming from the insertion site, and the blood does not stop with pressure. You have pus or a bad smell coming from the insertion site. Get help right away if: You have signs of a serious reaction. This may be coming from an allergy or the body's defense system (immune system). Signs include: Trouble breathing or shortness of breath. Swelling of the face or feeling warm (flushed). Fever or chills. Head, chest, or back pain. Dark pee (urine) or blood in the pee. Widespread rash. Fast  heartbeat. Feeling dizzy or light-headed. You may receive your blood transfusion in an outpatient setting. If so, you will be told whom to contact to report any reactions. These symptoms may be an emergency. Do not wait to see if the symptoms will go away. Get medical help right away. Call your local emergency services (911 in the [place] Do not drive yourself to the hospital. Summary Bruising and soreness at the IV site are common. Check your insertion site every day for signs of infection. Rest as told by your doctor. Go back to your normal activities as told by your doctor. Get help right away if you have signs of a serious reaction. This information is not intended to replace advice given to you by your health care provider. Make sure you discuss any questions you have with your health care provider. Document Revised: 12/03/2020 Document Reviewed: 01/31/2019 Elsevier Patient Education  2022 [employer]  

## 2021-06-17 NOTE — Progress Notes (Signed)
Blood orders entered. 

## 2021-06-17 NOTE — Progress Notes (Signed)
[place] Telephone:([phone]) [phone]   Fax:(336) [phone]  OFFICE PROGRESS NOTE  [name], MD [place] [address]  DIAGNOSIS: stage IIIA (T2a, N2, M0) non-small cell lung cancer, adenocarcinoma presented with right lower lobe lung mass and mediastinal lymphadenopathy. Diagnosed in May 2020.    Biomarker Findings Microsatellite status - MS-Stable Tumor Mutational Burden - 9 Muts/Mb Genomic Findings For a complete list of the genes assayed, please refer to the Appendix. NF1 K33* TP53 R158P 8 Disease relevant genes with no reportable alterations: ALK, BRAF, EGFR, ERBB2, KRAS, MET, RET, ROS1   PDL 1 expression negative   PRIOR THERAPY:   1) Status post right lower lobectomy with lymph node dissection on Jan 07, 2019 under the care of Dr. Roxan Hockey. 2) Adjuvant platinum based regimen with cisplatin 75 mg/M2 and Alimta 500 mg/M2 every 3 weeks. First dose on June 30th, 2020.  Status post 4 cycles. 3) Adjuvant radiotherapy to the mediastinum under the care of Dr. Lisbeth Renshaw.  First fraction on May 23, 2019.   CURRENT THERAPY:  Observation.  INTERVAL HISTORY: Sandy [name] 65 y.o. female returns to the clinic today for 3-monthfollow-up visit.  The patient continues to complain of increasing fatigue and weakness as well as shortness of breath at baseline increased with exertion.  She denied having any current chest pain, cough or hemoptysis.  She denied having any fever or chills.  She has no nausea, vomiting, diarrhea or constipation.  She has no headache or visual changes.  She was found on recent blood work to have significant iron deficiency anemia.  The patient never had a colonoscopy before.  She was supposed to have repeat CT scan of the chest before this visit but unfortunately it was not performed as ordered.  She is here today for evaluation and discussion of her blood work.  MEDICAL HISTORY: Past Medical History:  Diagnosis  Date   Adenocarcinoma of lung, stage 3, right ([facility] 2020   Arthritis    Asthma    Cirrhosis (HCC)    COPD (chronic obstructive pulmonary disease) (HCC)    Dyslipidemia    Dyspnea    Esophageal varices (HCC)    History of blood transfusion    History of hiatal hernia    Hypertension    Pneumonia    Type 2 diabetes mellitus (HCC)    Type II    ALLERGIES:  is allergic to codeine and nsaids.  MEDICATIONS:  Current Outpatient Medications  Medication Sig Dispense Refill   atorvastatin (LIPITOR) 10 MG tablet Take 10 mg by mouth at bedtime.      benzonatate (TESSALON) 100 MG capsule Take 1 capsule (100 mg total) by mouth 3 (three) times daily as needed for cough. 30 capsule 1   FeFum-FePoly-FA-B Cmp-C-Biot (INTEGRA PLUS) CAPS Take 1 capsule by mouth daily. 30 capsule 4   levothyroxine (SYNTHROID) 25 MCG tablet      losartan (COZAAR) 100 MG tablet Take 1 tablet by mouth daily.     metFORMIN (GLUCOPHAGE-XR) 500 MG 24 hr tablet Take 500 mg by mouth at bedtime.     methylPREDNISolone (MEDROL DOSEPAK) 4 MG TBPK tablet Use as instructed 21 tablet 0   nadolol (CORGARD) 20 MG tablet Take 20 mg by mouth daily.     omeprazole (PRILOSEC) 40 MG capsule Take 30- 60 min before your first and last meals of the day 60 capsule 11   ondansetron (ZOFRAN ODT) 8 MG disintegrating tablet  Take 1 tablet (8 mg total) by mouth every 8 (eight) hours as needed for nausea or vomiting. 20 tablet 0   potassium chloride (KLOR-CON) 10 MEQ tablet Take 1 tablet (10 mEq total) by mouth daily. 5 tablet 0   spironolactone (ALDACTONE) 25 MG tablet TAKE 1 TABLET(25 MG) BY MOUTH IN THE MORNING 90 tablet 0   No current facility-administered medications for this visit.    SURGICAL HISTORY:  Past Surgical History:  Procedure Laterality Date   ABDOMINAL HYSTERECTOMY     ANTERIOR CRUCIATE LIGAMENT REPAIR Left    x 2   ESOPHAGOGASTRODUODENOSCOPY     LOBECTOMY Right 01/07/2019   Procedure: RIGHT LOWER LOBE LOBECTOMY AND  INTERCOSTAL NERVE BLOCK;  Surgeon: Melrose Nakayama, MD;  Location: [facility];  Service: Thoracic;  Laterality: Right;   VIDEO ASSISTED THORACOSCOPY (VATS)/WEDGE RESECTION Right 01/07/2019   Procedure: VIDEO ASSISTED THORACOSCOPY (VATS) RIGHT LOWER LOBE WEDGE RESECTION;  Surgeon: Melrose Nakayama, MD;  Location: [facility];  Service: Thoracic;  Laterality: Right;    REVIEW OF SYSTEMS:  Constitutional: positive for fatigue Eyes: negative Ears, nose, mouth, throat, and face: negative Respiratory: positive for dyspnea on exertion Cardiovascular: negative Gastrointestinal: negative Genitourinary:negative Integument/breast: negative Hematologic/lymphatic: negative Musculoskeletal:negative Neurological: negative Behavioral/Psych: negative Endocrine: negative Allergic/Immunologic: negative   PHYSICAL EXAMINATION: General appearance: alert, cooperative, fatigued, and no distress Head: Normocephalic, without obvious abnormality, atraumatic Neck: no adenopathy, no JVD, supple, symmetrical, trachea midline, and thyroid not enlarged, symmetric, no tenderness/mass/nodules Lymph nodes: Cervical, supraclavicular, and axillary nodes normal. Resp: clear to auscultation bilaterally Back: symmetric, no curvature. ROM normal. No CVA tenderness. Cardio: regular rate and rhythm, S1, S2 normal, no murmur, click, rub or gallop GI: soft, non-tender; bowel sounds normal; no masses,  no organomegaly Extremities: extremities normal, atraumatic, no cyanosis or edema Neurologic: Alert and oriented X 3, normal strength and tone. Normal symmetric reflexes. Normal coordination and gait  ECOG PERFORMANCE STATUS: 1 - Symptomatic but completely ambulatory  Blood pressure (!) 116/50, pulse 92, temperature (!) 96.5 F (35.8 C), temperature source Tympanic, resp. rate 18, height $RemoveBe'5\' 3"'[id number]$  (1.6 m), weight 132 lb 6.4 oz (60.1 kg), SpO2 90 %.  LABORATORY DATA: Lab Results  Component Value Date   WBC 3.2 (L) 06/15/2021    HGB 7.0 (L) 06/15/2021   HCT 24.1 (L) 06/15/2021   MCV 95.3 06/15/2021   PLT 193 06/15/2021      Chemistry      Component Value Date/Time   NA 138 06/15/2021 1103   NA 138 10/20/2020 1507   K 4.1 06/15/2021 1103   CL 108 06/15/2021 1103   CO2 19 (L) 06/15/2021 1103   BUN 5 (L) 06/15/2021 1103   BUN 6 (L) 10/20/2020 1507   CREATININE 0.78 06/15/2021 1103      Component Value Date/Time   CALCIUM 8.7 (L) 06/15/2021 1103   ALKPHOS 97 06/15/2021 1103   AST 44 (H) 06/15/2021 1103   ALT 16 06/15/2021 1103   BILITOT 2.5 (H) 06/15/2021 1103       RADIOGRAPHIC STUDIES: No results found.  ASSESSMENT AND PLAN: This is a very pleasant 65 years old white female with stage IIIA non-small cell lung cancer, adenocarcinoma with no actionable mutations status post right lower lobectomy with lymph node dissection under the care of Dr. Roxan Hockey on Jan 07, 2019. The patient completed adjuvant systemic chemotherapy with cisplatin and Alimta status post 4 cycles. This was also followed by adjuvant radiotherapy to the mediastinum completed in October 2020.   The  patient has been in observation since that time and with no concerning adverse effects. She continues to complain of increasing fatigue and weakness.  She was supposed to have repeat CT scan of the chest before this visit but this was not done as ordered.  We will arrange for the patient to have the repeat CT scan of the chest in the next few days. Of the scan showed no concerning findings for disease recurrence or metastasis, I will see her back for follow-up visit in 6 months with repeat CT scan of the chest. For the persistent iron deficiency anemia, we will refer the patient to gastroenterology for evaluation and consideration of colonoscopy since the patient has no colonoscopy done in the past.  I will also arrange for the patient to receive 1 unit of PRBCs transfusion. I would consider her for iron infusion with Venofer 300 mg IV weekly  for 3 weeks. The patient was advised to call immediately if she has any other concerning symptoms in the interval. She was advised to call immediately if she has any other concerning symptoms in the interval. The patient voices understanding of current disease status and treatment options and is in agreement with the current care plan. All questions were answered. The patient knows to call the clinic with any problems, questions or concerns. We can certainly see the patient much sooner if necessary.   Disclaimer: This note was dictated with voice recognition software. Similar sounding words can inadvertently be transcribed and may not be corrected upon review.

## 2021-06-17 NOTE — Progress Notes (Signed)
Per Cassandra, PA ok to infuse PRBC at 300 ml/ hr.

## 2021-06-18 ENCOUNTER — Telehealth: Payer: Self-pay | Admitting: Internal Medicine

## 2021-06-18 ENCOUNTER — Other Ambulatory Visit: Payer: Self-pay | Admitting: Pharmacy Technician

## 2021-06-18 ENCOUNTER — Encounter: Payer: Self-pay | Admitting: Internal Medicine

## 2021-06-18 ENCOUNTER — Telehealth: Payer: Self-pay | Admitting: Pharmacy Technician

## 2021-06-18 DIAGNOSIS — D500 Iron deficiency anemia secondary to blood loss (chronic): Secondary | ICD-10-CM | POA: Insufficient documentation

## 2021-06-18 LAB — BPAM RBC
Blood Product Expiration Date: 202211242359
Blood Product Expiration Date: 202211272359
ISSUE DATE / TIME: 202210271209
ISSUE DATE / TIME: 202210271209
Unit Type and Rh: 5100
Unit Type and Rh: 5100

## 2021-06-18 LAB — TYPE AND SCREEN
ABO/RH(D): O POS
Antibody Screen: NEGATIVE
Unit division: 0
Unit division: 0

## 2021-06-18 NOTE — Telephone Encounter (Signed)
Auth Submission: no auth needed Payer: medicare Medication & CPT/J Code(s) submitted: Venofer (Iron Sucrose) J1756 Route of submission (phone, fax, portal): phone Auth type: Buy/Bill Units/visits requested: 5 Reference number: [id number] Approval date: 06/18/21 - 07/19/21 Patient has met $233 deductible Medicare will cover 80% and patient will be responsible for 20%.   Patient will be scheduled as soon as possible.

## 2021-06-18 NOTE — Telephone Encounter (Signed)
Left message with follow-up appointments per 10/27 los.

## 2021-06-19 ENCOUNTER — Other Ambulatory Visit: Payer: Self-pay | Admitting: Internal Medicine

## 2021-06-25 ENCOUNTER — Ambulatory Visit (HOSPITAL_COMMUNITY)
Admission: RE | Admit: 2021-06-25 | Discharge: 2021-06-25 | Disposition: A | Discharge status: Home / Self Care | Payer: Medicare Other | Source: Ambulatory Visit | Attending: Nurse Practitioner | Admitting: Nurse Practitioner

## 2021-06-25 ENCOUNTER — Other Ambulatory Visit: Payer: Self-pay

## 2021-06-25 ENCOUNTER — Ambulatory Visit
Admission: RE | Admit: 2021-06-25 | Discharge: 2021-06-25 | Disposition: A | Discharge status: Home / Self Care | Payer: Medicare Other | Source: Ambulatory Visit | Attending: Nurse Practitioner | Admitting: Nurse Practitioner

## 2021-06-25 DIAGNOSIS — K8050 Calculus of bile duct without cholangitis or cholecystitis without obstruction: Secondary | ICD-10-CM | POA: Insufficient documentation

## 2021-06-25 DIAGNOSIS — K769 Liver disease, unspecified: Secondary | ICD-10-CM

## 2021-06-25 DIAGNOSIS — K7030 Alcoholic cirrhosis of liver without ascites: Secondary | ICD-10-CM

## 2021-06-25 MED ORDER — TECHNETIUM TC 99M MEBROFENIN IV KIT
7.5500 | PACK | Freq: Once | INTRAVENOUS | Status: AC | PRN
  Administered 2021-06-25: 7.55 via INTRAVENOUS

## 2021-06-28 ENCOUNTER — Other Ambulatory Visit: Payer: Self-pay | Admitting: Internal Medicine

## 2021-06-29 ENCOUNTER — Other Ambulatory Visit: Payer: Self-pay

## 2021-06-29 ENCOUNTER — Ambulatory Visit (INDEPENDENT_AMBULATORY_CARE_PROVIDER_SITE_OTHER): Payer: Medicare Other | Admitting: Thoracic Surgery (Cardiothoracic Vascular Surgery)

## 2021-06-29 VITALS — BP 117/69 | HR 82 | Resp 20 | Ht 63.0 in | Wt 132.0 lb

## 2021-06-29 DIAGNOSIS — C3491 Malignant neoplasm of unspecified part of right bronchus or lung: Secondary | ICD-10-CM | POA: Diagnosis not present

## 2021-06-29 DIAGNOSIS — Z902 Acquired absence of lung [part of]: Secondary | ICD-10-CM

## 2021-06-29 NOTE — Progress Notes (Signed)
[address]       [place] [address]             [phone]     HPI: Tayloranne Lekas returns for a scheduled follow-up visit for her history of stage IIIa non-small cell lung cancer  Verita Kuroda is a 65 year old woman with a history of tobacco abuse, lung cancer, type 2 diabetes, cirrhosis, varices, hypertension, hyperlipidemia, arthritis, iron deficiency anemia and chronic cough.  She had a right lower lobectomy in May 2020 for a T2, N2, stage IIIa adenocarcinoma.  She was treated with postoperative adjuvant chemotherapy and radiation.  Last saw her in May 2022.  She had no evidence of recurrent disease.  Her primary complaint was of persistent cough.  We gave her prescription for Tessalon.  That helped a little with a cough but she said it made her feel "spaced out."  She recently saw Dr. Julien Nordmann.  She did not have a CT done.  She is scheduled to have 1 in April or May of next year.  She continues to complain of cough.  She also has had her teeth dropped out.  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  She is been feeling fatigued.  She was found to have iron deficiency anemia.  She has been getting iron infusions.  Past Medical History:  Diagnosis Date   Adenocarcinoma of lung, stage 3, right ([facility]) 2020   Arthritis    Asthma    Cirrhosis (HCC)    COPD (chronic obstructive pulmonary disease) (HCC)    Dyslipidemia    Dyspnea    Esophageal varices (HCC)    History of blood transfusion    History of hiatal hernia    Hypertension    Pneumonia    Type 2 diabetes mellitus (HCC)    Type II    Current Outpatient Medications  Medication Sig Dispense Refill   atorvastatin (LIPITOR) 10 MG tablet Take 10 mg by mouth at bedtime.      FeFum-FePoly-FA-B Cmp-C-Biot (INTEGRA PLUS) CAPS TAKE 1 CAPSULE BY MOUTH DAILY 30 capsule 4   levothyroxine (SYNTHROID) 25 MCG tablet      losartan (COZAAR) 100 MG tablet Take  1 tablet by mouth daily.     metFORMIN (GLUCOPHAGE-XR) 500 MG 24 hr tablet Take 500 mg by mouth at bedtime.     nadolol (CORGARD) 20 MG tablet Take 20 mg by mouth daily.     omeprazole (PRILOSEC) 40 MG capsule Take 30- 60 min before your first and last meals of the day 60 capsule 11   benzonatate (TESSALON) 100 MG capsule Take 1 capsule (100 mg total) by mouth 3 (three) times daily as needed for cough. (Patient not taking: Reported on 06/29/2021) 30 capsule 1   methylPREDNISolone (MEDROL DOSEPAK) 4 MG TBPK tablet Use as instructed (Patient not taking: Reported on 06/29/2021) 21 tablet 0   ondansetron (ZOFRAN ODT) 8 MG disintegrating tablet Take 1 tablet (8 mg total) by mouth every 8 (eight) hours as needed for nausea or vomiting. (Patient not taking: Reported on 06/29/2021) 20 tablet 0   spironolactone (ALDACTONE) 25 MG tablet TAKE 1 TABLET(25 MG) BY MOUTH IN THE MORNING (Patient not taking: Reported on 06/29/2021) 90 tablet 0   No current facility-administered medications for this visit.    Physical Exam BP 117/69   Pulse 82   Resp 20   Ht 5\' 3"  (1.6 m)   Wt  132 lb (59.9 kg)   SpO2 98% Comment: RA  BMI 23.67 kg/m  65 year old woman in no acute distress Alert and oriented x3 with no focal deficits Lungs diminished at right base, otherwise clear No cervical or supraclavicular adenopathy Cardiac regular rate and rhythm  Diagnostic Tests: none  Impression: Jamea Robicheaux is a 65 year old woman who is 2-1/2 years out from a right lower lobectomy followed by neoadjuvant chemoradiation for T2, N2, stage IIIa adenocarcinoma of the lung.  Her current issues include anemia, chronic cough, dental issues, and hypothyroidism.  Anemia-iron deficiency.  Causing fatigue.  She is getting iron infusions weekly.  Tobacco abuse-quit smoking in 2016.  Plan: I will plan to see her back in 6 months after she has a CT with Dr. Thad Ranger, MD [facility] [phone]

## 2021-06-30 ENCOUNTER — Ambulatory Visit (INDEPENDENT_AMBULATORY_CARE_PROVIDER_SITE_OTHER): Payer: Medicare Other

## 2021-06-30 VITALS — BP 159/99 | HR 85 | Temp 98.1°F | Resp 16 | Ht 63.0 in | Wt 130.0 lb

## 2021-06-30 DIAGNOSIS — D500 Iron deficiency anemia secondary to blood loss (chronic): Secondary | ICD-10-CM | POA: Diagnosis not present

## 2021-06-30 DIAGNOSIS — N920 Excessive and frequent menstruation with regular cycle: Secondary | ICD-10-CM

## 2021-06-30 MED ORDER — DIPHENHYDRAMINE HCL 25 MG PO CAPS: 25.0000 mg | ORAL_CAPSULE | Freq: Once | ORAL | Status: DC

## 2021-06-30 MED ORDER — FAMOTIDINE IN NACL 20-0.9 MG/50ML-% IV SOLN: 20.0000 mg | Freq: Once | INTRAVENOUS | Status: DC | PRN

## 2021-06-30 MED ORDER — METHYLPREDNISOLONE SODIUM SUCC 125 MG IJ SOLR: 125.0000 mg | Freq: Once | INTRAMUSCULAR | Status: DC | PRN

## 2021-06-30 MED ORDER — EPINEPHRINE 0.3 MG/0.3ML IJ SOAJ: 0.3000 mg | Freq: Once | INTRAMUSCULAR | Status: DC | PRN

## 2021-06-30 MED ORDER — SODIUM CHLORIDE 0.9 % IV SOLN: Freq: Once | INTRAVENOUS | Status: DC | PRN

## 2021-06-30 MED ORDER — DIPHENHYDRAMINE HCL 50 MG/ML IJ SOLN: 50.0000 mg | Freq: Once | INTRAMUSCULAR | Status: DC | PRN

## 2021-06-30 MED ORDER — SODIUM CHLORIDE 0.9 % IV SOLN
300.0000 mg | INTRAVENOUS | Status: DC
  Administered 2021-06-30: 300 mg via INTRAVENOUS
  Filled 2021-06-30: qty 15

## 2021-06-30 MED ORDER — ACETAMINOPHEN 325 MG PO TABS: 650.0000 mg | ORAL_TABLET | Freq: Once | ORAL | Status: DC

## 2021-06-30 MED ORDER — ALBUTEROL SULFATE HFA 108 (90 BASE) MCG/ACT IN AERS: 2.0000 | INHALATION_SPRAY | Freq: Once | RESPIRATORY_TRACT | Status: DC | PRN

## 2021-06-30 NOTE — Progress Notes (Signed)
Diagnosis: Iron Deficiency Anemia  Provider:  Marshell Garfinkel, MD  Procedure: Infusion  IV Type: Peripheral, IV Location: R Forearm  Venofer (Iron Sucrose), Dose: 300 mg  Infusion Start Time: 14.26 06/30/2021  Infusion Stop Time: 16.02 06/30/2021  Post Infusion IV Care: Observation period completed and Peripheral IV Discontinued  Discharge: Condition: Good, Destination: Home . AVS provided to patient.   Performed by:  Arnoldo Morale, RN

## 2021-07-06 ENCOUNTER — Ambulatory Visit: Payer: Medicare Other | Admitting: Thoracic Surgery (Cardiothoracic Vascular Surgery)

## 2021-07-07 ENCOUNTER — Ambulatory Visit (INDEPENDENT_AMBULATORY_CARE_PROVIDER_SITE_OTHER): Payer: Medicare Other

## 2021-07-07 ENCOUNTER — Other Ambulatory Visit: Payer: Self-pay

## 2021-07-07 VITALS — BP 147/85 | HR 83 | Temp 97.8°F | Resp 18 | Ht 63.0 in | Wt 129.0 lb

## 2021-07-07 DIAGNOSIS — D500 Iron deficiency anemia secondary to blood loss (chronic): Secondary | ICD-10-CM

## 2021-07-07 MED ORDER — METHYLPREDNISOLONE SODIUM SUCC 125 MG IJ SOLR: 125.0000 mg | Freq: Once | INTRAMUSCULAR | Status: DC | PRN

## 2021-07-07 MED ORDER — SODIUM CHLORIDE 0.9 % IV SOLN: Freq: Once | INTRAVENOUS | Status: DC | PRN

## 2021-07-07 MED ORDER — ALBUTEROL SULFATE HFA 108 (90 BASE) MCG/ACT IN AERS: 2.0000 | INHALATION_SPRAY | Freq: Once | RESPIRATORY_TRACT | Status: DC | PRN

## 2021-07-07 MED ORDER — DIPHENHYDRAMINE HCL 25 MG PO CAPS
25.0000 mg | ORAL_CAPSULE | Freq: Once | ORAL | Status: AC
  Administered 2021-07-07: 25 mg via ORAL
  Filled 2021-07-07: qty 1

## 2021-07-07 MED ORDER — EPINEPHRINE 0.3 MG/0.3ML IJ SOAJ: 0.3000 mg | Freq: Once | INTRAMUSCULAR | Status: DC | PRN

## 2021-07-07 MED ORDER — FAMOTIDINE IN NACL 20-0.9 MG/50ML-% IV SOLN: 20.0000 mg | Freq: Once | INTRAVENOUS | Status: DC | PRN

## 2021-07-07 MED ORDER — ACETAMINOPHEN 325 MG PO TABS: 650.0000 mg | ORAL_TABLET | Freq: Once | ORAL | Status: DC

## 2021-07-07 MED ORDER — SODIUM CHLORIDE 0.9 % IV SOLN
300.0000 mg | INTRAVENOUS | Status: DC
  Administered 2021-07-07: 300 mg via INTRAVENOUS
  Filled 2021-07-07: qty 15

## 2021-07-07 MED ORDER — DIPHENHYDRAMINE HCL 50 MG/ML IJ SOLN: 50.0000 mg | Freq: Once | INTRAMUSCULAR | Status: DC | PRN

## 2021-07-07 NOTE — Progress Notes (Signed)
Diagnosis: Iron Deficiency Anemia  Provider:  Marshell Garfinkel, MD  Procedure: Infusion  IV Type: Peripheral, IV Location: R Antecubital  Venofer (Iron Sucrose), Dose: 300 mg  Infusion Start Time: 1450  Infusion Stop Time: 4758  Post Infusion IV Care: Peripheral IV Discontinued  Discharge: Condition: Good, Destination: Home . AVS provided to patient.   Performed by:  Koren Shiver, RN

## 2021-07-14 ENCOUNTER — Ambulatory Visit (INDEPENDENT_AMBULATORY_CARE_PROVIDER_SITE_OTHER): Payer: Medicare Other

## 2021-07-14 ENCOUNTER — Other Ambulatory Visit: Payer: Self-pay

## 2021-07-14 VITALS — BP 154/85 | HR 80 | Temp 98.0°F | Resp 16 | Ht 63.0 in | Wt 127.8 lb

## 2021-07-14 DIAGNOSIS — R112 Nausea with vomiting, unspecified: Secondary | ICD-10-CM

## 2021-07-14 DIAGNOSIS — D500 Iron deficiency anemia secondary to blood loss (chronic): Secondary | ICD-10-CM

## 2021-07-14 MED ORDER — ACETAMINOPHEN 325 MG PO TABS
650.0000 mg | ORAL_TABLET | Freq: Once | ORAL | Status: AC
  Administered 2021-07-14: 650 mg via ORAL
  Filled 2021-07-14: qty 2

## 2021-07-14 MED ORDER — EPINEPHRINE 0.3 MG/0.3ML IJ SOAJ: 0.3000 mg | Freq: Once | INTRAMUSCULAR | Status: DC | PRN

## 2021-07-14 MED ORDER — ONDANSETRON HCL 4 MG/2ML IJ SOLN
4.0000 mg | Freq: Once | INTRAMUSCULAR | Status: AC
  Administered 2021-07-14: 4 mg via INTRAVENOUS
  Filled 2021-07-14: qty 2

## 2021-07-14 MED ORDER — DIPHENHYDRAMINE HCL 25 MG PO CAPS
25.0000 mg | ORAL_CAPSULE | Freq: Once | ORAL | Status: AC
  Administered 2021-07-14: 25 mg via ORAL
  Filled 2021-07-14: qty 1

## 2021-07-14 MED ORDER — DIPHENHYDRAMINE HCL 50 MG/ML IJ SOLN: 50.0000 mg | Freq: Once | INTRAMUSCULAR | Status: DC | PRN

## 2021-07-14 MED ORDER — METHYLPREDNISOLONE SODIUM SUCC 125 MG IJ SOLR: 125.0000 mg | Freq: Once | INTRAMUSCULAR | Status: DC | PRN

## 2021-07-14 MED ORDER — FAMOTIDINE IN NACL 20-0.9 MG/50ML-% IV SOLN: 20.0000 mg | Freq: Once | INTRAVENOUS | Status: DC | PRN

## 2021-07-14 MED ORDER — SODIUM CHLORIDE 0.9 % IV SOLN: Freq: Once | INTRAVENOUS | Status: DC | PRN

## 2021-07-14 MED ORDER — ALBUTEROL SULFATE HFA 108 (90 BASE) MCG/ACT IN AERS: 2.0000 | INHALATION_SPRAY | Freq: Once | RESPIRATORY_TRACT | Status: DC | PRN

## 2021-07-14 MED ORDER — SODIUM CHLORIDE 0.9 % IV SOLN
300.0000 mg | INTRAVENOUS | Status: DC
  Administered 2021-07-14: 300 mg via INTRAVENOUS
  Filled 2021-07-14: qty 15

## 2021-07-14 NOTE — Progress Notes (Signed)
Diagnosis: Iron Deficiency Anemia  Provider:  Marshell Garfinkel, MD  Procedure: Infusion  IV Type: Peripheral, IV Location: L Hand  Venofer (Iron Sucrose), Dose: 300 mg  Infusion Start Time: 7209  Infusion Stop Time: 1068  10:30am-After 247ml administered patient started vomiting and experiencing Nausea. Contacted dr. Vaughan Browner who ordered zofran 4mg  once.  Zofran , Dose: 4 mg Given IV push at 1103 over 1 minute.    11:24am-patient states she is feeling better since receiving zofran.   Post Infusion IV Care: Peripheral IV Discontinued  Discharge: Condition: Good, Destination: Home . AVS provided to patient.   Performed by:  Princesa Willig, Sherlon Handing, LPN

## 2021-07-26 ENCOUNTER — Telehealth: Payer: Self-pay | Admitting: Medical Oncology

## 2021-07-26 NOTE — Telephone Encounter (Signed)
I instructed pt to get her GI records from [facility] and send to [place] for new appt.

## 2021-08-24 ENCOUNTER — Other Ambulatory Visit: Payer: Self-pay | Admitting: Pharmacy Technician

## 2021-08-26 ENCOUNTER — Other Ambulatory Visit: Payer: Self-pay | Admitting: Nurse Practitioner

## 2021-08-26 DIAGNOSIS — K7030 Alcoholic cirrhosis of liver without ascites: Secondary | ICD-10-CM

## 2021-08-26 DIAGNOSIS — K769 Liver disease, unspecified: Secondary | ICD-10-CM

## 2021-09-08 ENCOUNTER — Ambulatory Visit
Admission: RE | Admit: 2021-09-08 | Discharge: 2021-09-08 | Disposition: A | Discharge status: Home / Self Care | Payer: Medicare Other | Source: Ambulatory Visit | Attending: Nurse Practitioner | Admitting: Nurse Practitioner

## 2021-09-08 DIAGNOSIS — K7030 Alcoholic cirrhosis of liver without ascites: Secondary | ICD-10-CM

## 2021-09-08 DIAGNOSIS — K769 Liver disease, unspecified: Secondary | ICD-10-CM

## 2021-09-23 ENCOUNTER — Inpatient Hospital Stay (HOSPITAL_COMMUNITY)
Admission: EM | Admit: 2021-09-23 | Discharge: 2021-09-26 | DRG: 378 | Disposition: A | Discharge status: Home / Self Care | Payer: Medicare Other | Attending: Internal Medicine | Admitting: Internal Medicine

## 2021-09-23 ENCOUNTER — Other Ambulatory Visit: Payer: Self-pay

## 2021-09-23 ENCOUNTER — Encounter (HOSPITAL_COMMUNITY): Payer: Self-pay | Admitting: Internal Medicine

## 2021-09-23 ENCOUNTER — Encounter: Payer: Self-pay | Admitting: Internal Medicine

## 2021-09-23 ENCOUNTER — Emergency Department (HOSPITAL_COMMUNITY): Payer: Medicare Other

## 2021-09-23 DIAGNOSIS — F1010 Alcohol abuse, uncomplicated: Secondary | ICD-10-CM | POA: Diagnosis present

## 2021-09-23 DIAGNOSIS — Z8719 Personal history of other diseases of the digestive system: Secondary | ICD-10-CM | POA: Diagnosis not present

## 2021-09-23 DIAGNOSIS — I4891 Unspecified atrial fibrillation: Secondary | ICD-10-CM | POA: Diagnosis not present

## 2021-09-23 DIAGNOSIS — Z79899 Other long term (current) drug therapy: Secondary | ICD-10-CM

## 2021-09-23 DIAGNOSIS — I8510 Secondary esophageal varices without bleeding: Secondary | ICD-10-CM | POA: Diagnosis present

## 2021-09-23 DIAGNOSIS — K317 Polyp of stomach and duodenum: Secondary | ICD-10-CM | POA: Diagnosis not present

## 2021-09-23 DIAGNOSIS — I8501 Esophageal varices with bleeding: Secondary | ICD-10-CM

## 2021-09-23 DIAGNOSIS — Z885 Allergy status to narcotic agent status: Secondary | ICD-10-CM

## 2021-09-23 DIAGNOSIS — I10 Essential (primary) hypertension: Secondary | ICD-10-CM | POA: Diagnosis present

## 2021-09-23 DIAGNOSIS — T361X5A Adverse effect of cephalosporins and other beta-lactam antibiotics, initial encounter: Secondary | ICD-10-CM | POA: Diagnosis present

## 2021-09-23 DIAGNOSIS — Z923 Personal history of irradiation: Secondary | ICD-10-CM

## 2021-09-23 DIAGNOSIS — Z881 Allergy status to other antibiotic agents status: Secondary | ICD-10-CM | POA: Diagnosis not present

## 2021-09-23 DIAGNOSIS — Z8249 Family history of ischemic heart disease and other diseases of the circulatory system: Secondary | ICD-10-CM

## 2021-09-23 DIAGNOSIS — D509 Iron deficiency anemia, unspecified: Secondary | ICD-10-CM | POA: Diagnosis present

## 2021-09-23 DIAGNOSIS — Z20822 Contact with and (suspected) exposure to covid-19: Secondary | ICD-10-CM | POA: Diagnosis present

## 2021-09-23 DIAGNOSIS — K920 Hematemesis: Secondary | ICD-10-CM | POA: Diagnosis not present

## 2021-09-23 DIAGNOSIS — K766 Portal hypertension: Secondary | ICD-10-CM | POA: Diagnosis not present

## 2021-09-23 DIAGNOSIS — I471 Supraventricular tachycardia: Secondary | ICD-10-CM | POA: Diagnosis present

## 2021-09-23 DIAGNOSIS — K7030 Alcoholic cirrhosis of liver without ascites: Secondary | ICD-10-CM | POA: Diagnosis present

## 2021-09-23 DIAGNOSIS — D62 Acute posthemorrhagic anemia: Secondary | ICD-10-CM | POA: Diagnosis present

## 2021-09-23 DIAGNOSIS — Z87891 Personal history of nicotine dependence: Secondary | ICD-10-CM

## 2021-09-23 DIAGNOSIS — Z85118 Personal history of other malignant neoplasm of bronchus and lung: Secondary | ICD-10-CM

## 2021-09-23 DIAGNOSIS — K209 Esophagitis, unspecified without bleeding: Secondary | ICD-10-CM | POA: Diagnosis present

## 2021-09-23 DIAGNOSIS — E785 Hyperlipidemia, unspecified: Secondary | ICD-10-CM | POA: Diagnosis present

## 2021-09-23 DIAGNOSIS — Z886 Allergy status to analgesic agent status: Secondary | ICD-10-CM | POA: Diagnosis not present

## 2021-09-23 DIAGNOSIS — K31819 Angiodysplasia of stomach and duodenum without bleeding: Secondary | ICD-10-CM | POA: Diagnosis present

## 2021-09-23 DIAGNOSIS — K3189 Other diseases of stomach and duodenum: Secondary | ICD-10-CM | POA: Diagnosis present

## 2021-09-23 DIAGNOSIS — J449 Chronic obstructive pulmonary disease, unspecified: Secondary | ICD-10-CM | POA: Diagnosis present

## 2021-09-23 DIAGNOSIS — E890 Postprocedural hypothyroidism: Secondary | ICD-10-CM | POA: Diagnosis present

## 2021-09-23 DIAGNOSIS — Z902 Acquired absence of lung [part of]: Secondary | ICD-10-CM

## 2021-09-23 DIAGNOSIS — Z7984 Long term (current) use of oral hypoglycemic drugs: Secondary | ICD-10-CM

## 2021-09-23 DIAGNOSIS — Z9221 Personal history of antineoplastic chemotherapy: Secondary | ICD-10-CM

## 2021-09-23 DIAGNOSIS — E119 Type 2 diabetes mellitus without complications: Secondary | ICD-10-CM | POA: Diagnosis present

## 2021-09-23 DIAGNOSIS — K922 Gastrointestinal hemorrhage, unspecified: Secondary | ICD-10-CM

## 2021-09-23 DIAGNOSIS — Z79890 Hormone replacement therapy (postmenopausal): Secondary | ICD-10-CM

## 2021-09-23 DIAGNOSIS — I480 Paroxysmal atrial fibrillation: Secondary | ICD-10-CM | POA: Insufficient documentation

## 2021-09-23 LAB — I-STAT VENOUS BLOOD GAS, ED
Acid-Base Excess: 9 mmol/L — ABNORMAL HIGH (ref 0.0–2.0)
Bicarbonate: 33 mmol/L — ABNORMAL HIGH (ref 20.0–28.0)
Calcium, Ion: 1.13 mmol/L — ABNORMAL LOW (ref 1.15–1.40)
HCT: 40 % (ref 36.0–46.0)
Hemoglobin: 13.6 g/dL (ref 12.0–15.0)
O2 Saturation: 84 %
Potassium: 5.3 mmol/L — ABNORMAL HIGH (ref 3.5–5.1)
Sodium: 138 mmol/L (ref 135–145)
TCO2: 34 mmol/L — ABNORMAL HIGH (ref 22–32)
pCO2, Ven: 39.9 mmHg — ABNORMAL LOW (ref 44.0–60.0)
pH, Ven: 7.526 — ABNORMAL HIGH (ref 7.250–7.430)
pO2, Ven: 44 mmHg (ref 32.0–45.0)

## 2021-09-23 LAB — CBC WITH DIFFERENTIAL/PLATELET
Abs Immature Granulocytes: 0.03 10*3/uL (ref 0.00–0.07)
Basophils Absolute: 0.1 10*3/uL (ref 0.0–0.1)
Basophils Relative: 1 %
Eosinophils Absolute: 0.3 10*3/uL (ref 0.0–0.5)
Eosinophils Relative: 7 %
HCT: 40.2 % (ref 36.0–46.0)
Hemoglobin: 13.6 g/dL (ref 12.0–15.0)
Immature Granulocytes: 1 %
Lymphocytes Relative: 14 %
Lymphs Abs: 0.6 10*3/uL — ABNORMAL LOW (ref 0.7–4.0)
MCH: 37.5 pg — ABNORMAL HIGH (ref 26.0–34.0)
MCHC: 33.8 g/dL (ref 30.0–36.0)
MCV: 110.7 fL — ABNORMAL HIGH (ref 80.0–100.0)
Monocytes Absolute: 0.6 10*3/uL (ref 0.1–1.0)
Monocytes Relative: 14 %
Neutro Abs: 2.6 10*3/uL (ref 1.7–7.7)
Neutrophils Relative %: 63 %
Platelets: 188 10*3/uL (ref 150–400)
RBC: 3.63 MIL/uL — ABNORMAL LOW (ref 3.87–5.11)
RDW: 14.1 % (ref 11.5–15.5)
WBC: 4.1 10*3/uL (ref 4.0–10.5)
nRBC: 0 % (ref 0.0–0.2)

## 2021-09-23 LAB — HEMOGLOBIN A1C
Hgb A1c MFr Bld: 4.3 % — ABNORMAL LOW (ref 4.8–5.6)
Mean Plasma Glucose: 76.71 mg/dL

## 2021-09-23 LAB — I-STAT CHEM 8, ED
BUN: 13 mg/dL (ref 8–23)
Calcium, Ion: 1.11 mmol/L — ABNORMAL LOW (ref 1.15–1.40)
Chloride: 99 mmol/L (ref 98–111)
Creatinine, Ser: 0.6 mg/dL (ref 0.44–1.00)
Glucose, Bld: 137 mg/dL — ABNORMAL HIGH (ref 70–99)
HCT: 41 % (ref 36.0–46.0)
Hemoglobin: 13.9 g/dL (ref 12.0–15.0)
Potassium: 5.3 mmol/L — ABNORMAL HIGH (ref 3.5–5.1)
Sodium: 138 mmol/L (ref 135–145)
TCO2: 32 mmol/L (ref 22–32)

## 2021-09-23 LAB — LACTIC ACID, PLASMA: Lactic Acid, Venous: 3.1 mmol/L (ref 0.5–1.9)

## 2021-09-23 LAB — COMPREHENSIVE METABOLIC PANEL
ALT: 31 U/L (ref 0–44)
AST: 68 U/L — ABNORMAL HIGH (ref 15–41)
Albumin: 2.8 g/dL — ABNORMAL LOW (ref 3.5–5.0)
Alkaline Phosphatase: 110 U/L (ref 38–126)
Anion gap: 9 (ref 5–15)
BUN: 9 mg/dL (ref 8–23)
CO2: 26 mmol/L (ref 22–32)
Calcium: 9.5 mg/dL (ref 8.9–10.3)
Chloride: 102 mmol/L (ref 98–111)
Creatinine, Ser: 0.76 mg/dL (ref 0.44–1.00)
GFR, Estimated: >60 mL/min (ref >60)
Glucose, Bld: 141 mg/dL — ABNORMAL HIGH (ref 70–99)
Potassium: 4.1 mmol/L (ref 3.5–5.1)
Sodium: 137 mmol/L (ref 135–145)
Total Bilirubin: 3.5 mg/dL — ABNORMAL HIGH (ref 0.3–1.2)
Total Protein: 7.9 g/dL (ref 6.5–8.1)

## 2021-09-23 LAB — RESP PANEL BY RT-PCR (FLU A&B, COVID) ARPGX2
Influenza A by PCR: NEGATIVE
Influenza B by PCR: NEGATIVE
SARS Coronavirus 2 by RT PCR: NEGATIVE

## 2021-09-23 LAB — APTT: aPTT: 37 seconds — ABNORMAL HIGH (ref 24–36)

## 2021-09-23 LAB — PROTIME-INR
INR: 1.5 — ABNORMAL HIGH (ref 0.8–1.2)
Prothrombin Time: 18.3 seconds — ABNORMAL HIGH (ref 11.4–15.2)

## 2021-09-23 LAB — TYPE AND SCREEN
ABO/RH(D): O POS
Antibody Screen: NEGATIVE

## 2021-09-23 LAB — LIPASE, BLOOD: Lipase: 35 U/L (ref 11–51)

## 2021-09-23 MED ORDER — ONDANSETRON HCL 4 MG/2ML IJ SOLN
4.0000 mg | Freq: Once | INTRAMUSCULAR | Status: AC
  Administered 2021-09-23: 4 mg via INTRAVENOUS
  Filled 2021-09-23: qty 2

## 2021-09-23 MED ORDER — INSULIN ASPART 100 UNIT/ML IJ SOLN
0.0000 [IU] | Freq: Four times a day (QID) | INTRAMUSCULAR | Status: DC
  Administered 2021-09-24: 2 [IU] via SUBCUTANEOUS
  Administered 2021-09-24: 1 [IU] via SUBCUTANEOUS
  Administered 2021-09-24: 2 [IU] via SUBCUTANEOUS
  Administered 2021-09-25: 1 [IU] via SUBCUTANEOUS
  Administered 2021-09-25 (×2): 2 [IU] via SUBCUTANEOUS
  Administered 2021-09-25: 1 [IU] via SUBCUTANEOUS
  Administered 2021-09-26: 2 [IU] via SUBCUTANEOUS

## 2021-09-23 MED ORDER — SODIUM CHLORIDE 0.9 % IV SOLN
1.0000 g | Freq: Once | INTRAVENOUS | Status: AC
  Administered 2021-09-23: 1 g via INTRAVENOUS
  Filled 2021-09-23: qty 10

## 2021-09-23 MED ORDER — SODIUM CHLORIDE 0.9 % IV BOLUS
1000.0000 mL | Freq: Once | INTRAVENOUS | Status: AC
  Administered 2021-09-23: 1000 mL via INTRAVENOUS

## 2021-09-23 MED ORDER — PANTOPRAZOLE SODIUM 40 MG IV SOLR: 40.0000 mg | Freq: Two times a day (BID) | INTRAVENOUS | Status: DC

## 2021-09-23 MED ORDER — OCTREOTIDE LOAD VIA INFUSION
50.0000 ug | Freq: Once | INTRAVENOUS | Status: AC
  Administered 2021-09-23: 50 ug via INTRAVENOUS
  Filled 2021-09-23: qty 25

## 2021-09-23 MED ORDER — ADENOSINE 6 MG/2ML IV SOLN
6.0000 mg | Freq: Once | INTRAVENOUS | Status: AC
  Administered 2021-09-23: 6 mg via INTRAVENOUS
  Filled 2021-09-23: qty 2

## 2021-09-23 MED ORDER — ONDANSETRON HCL 4 MG PO TABS
4.0000 mg | ORAL_TABLET | Freq: Four times a day (QID) | ORAL | Status: DC | PRN
Start: 2021-09-23 — End: 2021-09-26

## 2021-09-23 MED ORDER — DILTIAZEM HCL-DEXTROSE 125-5 MG/125ML-% IV SOLN (PREMIX)
5.0000 mg/h | INTRAVENOUS | Status: DC
  Filled 2021-09-23: qty 125

## 2021-09-23 MED ORDER — SODIUM CHLORIDE 0.9 % IV SOLN
50.0000 ug/h | INTRAVENOUS | Status: DC
  Administered 2021-09-23 – 2021-09-24 (×3): 50 ug/h via INTRAVENOUS
  Filled 2021-09-23 (×3): qty 1

## 2021-09-23 MED ORDER — PANTOPRAZOLE INFUSION (NEW) - SIMPLE MED
8.0000 mg/h | INTRAVENOUS | Status: DC
  Administered 2021-09-23 – 2021-09-26 (×7): 8 mg/h via INTRAVENOUS
  Filled 2021-09-23 (×6): qty 80
  Filled 2021-09-23: qty 100
  Filled 2021-09-23: qty 80
  Filled 2021-09-23: qty 100

## 2021-09-23 MED ORDER — DILTIAZEM LOAD VIA INFUSION
10.0000 mg | Freq: Once | INTRAVENOUS | Status: DC
  Filled 2021-09-23: qty 10

## 2021-09-23 MED ORDER — ONDANSETRON HCL 4 MG/2ML IJ SOLN: 4.0000 mg | Freq: Four times a day (QID) | INTRAMUSCULAR | Status: DC | PRN

## 2021-09-23 MED ORDER — SODIUM CHLORIDE 0.9 % IV SOLN: 1.0000 g | INTRAVENOUS | Status: DC

## 2021-09-23 MED ORDER — SODIUM CHLORIDE 0.9 % IV SOLN: INTRAVENOUS | Status: DC

## 2021-09-23 MED ORDER — PANTOPRAZOLE 80MG IVPB - SIMPLE MED
80.0000 mg | Freq: Once | INTRAVENOUS | Status: AC
  Administered 2021-09-23: 80 mg via INTRAVENOUS
  Filled 2021-09-23: qty 80

## 2021-09-23 NOTE — Assessment & Plan Note (Signed)
Patient with a history of esophageal varices with bleeding.  She states this was several years ago.  She was treated at [facility].

## 2021-09-23 NOTE — ED Provider Notes (Signed)
[facility] EMERGENCY DEPARTMENT Provider Note   CSN: [id number] Arrival date & time: 09/23/21  1536    History  Vomiting blood  Sandy [name] is a 66 y.o. female here for cirrhosis, esophageal varices, lung cancer here for evaluation of bloody emesis.  Began at approximately 7 AM this morning.  States she has vomited approximately 1 gallon dark blood with BRB.  She is compliant with her home meds.  Followed by [facility] GI.  Some lightheadedness and dizziness.  Not currently undergoing treatment for her cancer.  No bowel movement today so she has any melena or blood per rectum.  No chest pain, shortness of breath, abdominal pain.  No lower extremity swelling. No hemoptysis, LE edema. Not on anticoagulation.  HPI     Home Medications Prior to Admission medications   Medication Sig Start Date End Date Taking? Authorizing Provider  atorvastatin (LIPITOR) 10 MG tablet Take 10 mg by mouth at bedtime.     [provider]  FeFum-FePoly-FA-B Cmp-C-Biot (INTEGRA PLUS) CAPS TAKE 1 CAPSULE BY MOUTH DAILY 06/28/21   Curt Bears, MD  levothyroxine (SYNTHROID) 25 MCG tablet  09/04/19   [provider]  losartan (COZAAR) 100 MG tablet Take 1 tablet by mouth daily. 02/12/20   [provider]  metFORMIN (GLUCOPHAGE-XR) 500 MG 24 hr tablet Take 500 mg by mouth at bedtime.    [provider]  nadolol (CORGARD) 20 MG tablet Take 20 mg by mouth daily. 06/15/20   [provider]  omeprazole (PRILOSEC) 40 MG capsule Take 30- 60 min before your first and last meals of the day 05/06/20   Tanda Rockers, MD  spironolactone (ALDACTONE) 25 MG tablet TAKE 1 TABLET(25 MG) BY MOUTH IN THE MORNING Patient not taking: Reported on 06/29/2021 09/30/20   Rex Kras, DO      Allergies    Codeine and Nsaids    Review of Systems   Review of Systems  Unable to perform ROS: Other  All other systems reviewed and are negative.  Physical Exam Updated  Vital Signs BP 109/78    Pulse (!) 122    Temp 97.7 F (36.5 C)    Resp (!) 24    SpO2 98%  Physical Exam Vitals and nursing note reviewed.  Constitutional:      General: She is in acute distress.     Appearance: She is well-developed. She is ill-appearing.     Comments: Actively vomiting dark and BR bloody emesis into bag  HENT:     Head: Normocephalic and atraumatic.     Nose: Nose normal.  Eyes:     Pupils: Pupils are equal, round, and reactive to light.  Cardiovascular:     Rate and Rhythm: Tachycardia present.     Pulses: Normal pulses.          Radial pulses are 2+ on the right side and 2+ on the left side.       Dorsalis pedis pulses are 2+ on the right side and 2+ on the left side.     Heart sounds: Normal heart sounds.  Pulmonary:     Effort: Pulmonary effort is normal. No respiratory distress.     Breath sounds: Normal breath sounds.  Abdominal:     General: Bowel sounds are normal. There is no distension.     Palpations: Abdomen is soft.     Tenderness: There is no abdominal tenderness.  Musculoskeletal:        General:  No swelling, tenderness, deformity or signs of injury. Normal range of motion.     Cervical back: Normal range of motion.     Right lower leg: No edema.     Left lower leg: No edema.  Skin:    General: Skin is warm and dry.     Capillary Refill: Capillary refill takes less than 2 seconds.  Neurological:     General: No focal deficit present.     Mental Status: She is alert and oriented to person, place, and time.  Psychiatric:        Mood and Affect: Mood normal.   ED Results / Procedures / Treatments   Labs (all labs ordered are listed, but only abnormal results are displayed) Labs Reviewed  CBC WITH DIFFERENTIAL/PLATELET - Abnormal; Notable for the following components:      Result Value   RBC 3.63 (*)    MCV 110.7 (*)    MCH 37.5 (*)    Lymphs Abs 0.6 (*)    All other components within normal limits  COMPREHENSIVE METABOLIC PANEL -  Abnormal; Notable for the following components:   Glucose, Bld 141 (*)    Albumin 2.8 (*)    AST 68 (*)    Total Bilirubin 3.5 (*)    All other components within normal limits  PROTIME-INR - Abnormal; Notable for the following components:   Prothrombin Time 18.3 (*)    INR 1.5 (*)    All other components within normal limits  LACTIC ACID, PLASMA - Abnormal; Notable for the following components:   Lactic Acid, Venous 3.1 (*)    All other components within normal limits  APTT - Abnormal; Notable for the following components:   aPTT 37 (*)    All other components within normal limits  I-STAT VENOUS BLOOD GAS, ED - Abnormal; Notable for the following components:   pH, Ven 7.526 (*)    pCO2, Ven 39.9 (*)    Bicarbonate 33.0 (*)    TCO2 34 (*)    Acid-Base Excess 9.0 (*)    Potassium 5.3 (*)    Calcium, Ion 1.13 (*)    All other components within normal limits  I-STAT CHEM 8, ED - Abnormal; Notable for the following components:   Potassium 5.3 (*)    Glucose, Bld 137 (*)    Calcium, Ion 1.11 (*)    All other components within normal limits  RESP PANEL BY RT-PCR (FLU A&B, COVID) ARPGX2  LIPASE, BLOOD  AMMONIA  CBC WITH DIFFERENTIAL/PLATELET  MAGNESIUM  HEMOGLOBIN AND HEMATOCRIT, BLOOD  TYPE AND SCREEN    EKG EKG Interpretation  Date/Time:  Thursday September 23 2021 16:51:45 EST Ventricular Rate:  113 PR Interval:  102 QRS Duration: 83 QT Interval:  351 QTC Calculation: 482 R Axis:   69 Text Interpretation: Sinus tachycardia Confirmed by Lavenia Atlas [id number]) on 09/23/2021 6:13:16 PM  Radiology DG Chest Portable 1 View  Result Date: 09/23/2021 CLINICAL DATA:  Shortness of breath and vomiting blood. EXAM: PORTABLE CHEST 1 VIEW COMPARISON:  Multiple prior CT chest examinations as well as chest radiograph dated Dec 31, 2019 FINDINGS: Status post right lobectomy with fibrosis in the right hilar region and mediastinal shift to the right is unchanged. Left lung is clear. Mild  levoscoliosis of the thoracic spine. No acute osseous abnormality. IMPRESSION: Postsurgical changes with volume loss in the right lung and fibrosis in the right hilar region with mediastinal shift to the right are unchanged. Left lung is clear. No definite  evidence of acute cardiopulmonary process. Electronically Signed   By: Keane Police D.O.   On: 09/23/2021 17:13     US Abdomen Limited RUQ (LIVER/GB) [[id number]] Resulted: 09/08/21 1051  Order Status: Completed Updated: 09/08/21 1053  Narrative:    CLINICAL DATA:  Alcoholic cirrhosis.   EXAM:  ULTRASOUND ABDOMEN LIMITED RIGHT UPPER QUADRANT   COMPARISON:  06/25/2021, CT abdomen pelvis 03/05/2021 and MR abdomen  03/03/2020.   FINDINGS:  Gallbladder:   No gallstones or wall thickening visualized. No sonographic Murphy  sign noted by sonographer.   Common bile duct:   Diameter: 3 mm, within normal limits.   Liver:   Coarsened in echotexture with a nodular contour. Tiny hypoechoic  lesion in the left hepatic lobe, too small to characterize. Portal  vein is patent on color Doppler imaging with reversal of blood flow  towards the liver.   Other: An anechoic lesion with increased through transmission in the  upper pole right kidney is incidentally imaged, measuring 3.6 x 4.0  cm.   IMPRESSION:  1. Cirrhosis with hepatofugal flow in the portal vein.  2. Subcentimeter hypoechoic lesion in the left hepatic lobe, too  small to characterize. Repeat MR abdomen without and with contrast,  as suggested on 03/03/2020, is recommended as hepatocellular  carcinoma cannot be excluded.  3. Right renal cyst.    Electronically Signed    By: Lorin Picket M.D.    On: 09/08/2021 10:51     Procedures .Critical Care Performed by: Nettie Elm, PA-C Authorized by: Nettie Elm, PA-C   Critical care provider statement:    Critical care time (minutes):  35   Critical care was necessary to treat or prevent imminent or  life-threatening deterioration of the following conditions:  Circulatory failure and hepatic failure   Critical care was time spent personally by me on the following activities:  Development of treatment plan with patient or surrogate, discussions with consultants, evaluation of patient's response to treatment, examination of patient, ordering and review of laboratory studies, ordering and review of radiographic studies, ordering and performing treatments and interventions, pulse oximetry, re-evaluation of patient's condition and review of old charts    Medications Ordered in ED Medications  sodium chloride 0.9 % bolus 1,000 mL (0 mLs Intravenous Stopped 09/23/21 1802)    And  0.9 %  sodium chloride infusion ( Intravenous New Bag/Given 09/23/21 1658)  octreotide (SANDOSTATIN) 2 mcg/mL load via infusion 50 mcg (50 mcg Intravenous Bolus from Bag 09/23/21 1714)    And  octreotide (SANDOSTATIN) 500 mcg in sodium chloride 0.9 % 250 mL (2 mcg/mL) infusion (50 mcg/hr Intravenous New Bag/Given 09/23/21 1714)  pantoprozole (PROTONIX) 80 mg /NS 100 mL infusion (8 mg/hr Intravenous New Bag/Given 09/23/21 1720)  ondansetron (ZOFRAN) injection 4 mg (4 mg Intravenous Given 09/23/21 1710)  cefTRIAXone (ROCEPHIN) 1 g in sodium chloride 0.9 % 100 mL IVPB (0 g Intravenous Stopped 09/23/21 1729)  pantoprazole (PROTONIX) 80 mg /NS 100 mL IVPB (0 mg Intravenous Stopped 09/23/21 1802)  adenosine (ADENOCARD) 6 MG/2ML injection 6 mg (6 mg Intravenous Given 09/23/21 1842)  ondansetron (ZOFRAN) injection 4 mg (4 mg Intravenous Given 09/23/21 1833)   ED Course/ Medical Decision Making/ A&P    66 year old with complicated medical history including lung cancer currently not undergoing treatment, prior alcoholic cirrhosis with bleeding esophageal varices here for evaluation of vomiting blood which began approximately 8 hours PTA.  States she has vomited approximately 1 gallon blood PTA.  On arrival she is  tachycardic however normotensive.   Does appear pale.  Denies any melena or blood per rectum however states she has not had a bowel movement in approximately 2 days.  She follows with [facility].  She is compliant with her home medications.  Denies any chest pain or shortness of breath.  Apparently has needed multiple transfusions previously.  Given history of bleeding varices, dark red as well as bright red bloody emesis started on octreotide, rocephin, IVF, Protonix. Will need labs, imaging and close monitoring.  Labs and imaging personally reviewed and interpreted:  CBC without leukocytosis, hemoglobin 13.6 elevated from baseline Metabolic panel with normal BUN, elevated T bili at 3.5, elevated AST however similar to prior Lactic acid 3.1 Lipase 35 Chest x-ray similar to prior  Patient with persistent dark and bright red bloody emesis.  Will discuss with GI.  CONSULT with GI. Dr. Bryan Lemma. Will assess patient at bedside  Patient reassessed. She had episode of bloody emesis and subsequently went into tachyarrhythmia, seems to be SVT.  Discussed with attending we will try 6 mg adenosine.  States she has been with type previously for arrhythmia however has never had a formal diagnosis.  No history of A. Fib either.   Given Adenosine, Looks like Afib on monitor. Will start on Cardizem drip. BP normotensive currently.  Bedside occult positive for blood in emesis.  Nursing went to start Cardizem drip new onset A. fib RVR however patient returned to sinus tachycardia around 110.  We will hold on Cardizem drip.  CONSULT with Dr. Bridgett Larsson with [facility] who will assess patient at bedside for admission.  GI concern due to new onset A. fib with RVR and anesthesia.  Consulted with anesthesiology.  Given patient currently hemodynamically stable, not currently vomiting.  We will hold on EGD tonight unless patient becomes unstable. Discussed plan with admitting team.  Discussed with patient, daughter in room.  Agreeable for admission.  The  patient appears reasonably stabilized for admission considering the current resources, flow, and capabilities available in the ED at this time, and I doubt any other [facility] requiring further screening and/or treatment in the ED prior to admission.       CHA2DS2-VASc Score: 4                    Medical Decision Making Amount and/or Complexity of Data Reviewed Independent Historian:     Details: family in room External Data Reviewed: labs, radiology, ECG and notes.    Details: [facility] liver transplant Labs: ordered. Decision-making details documented in ED Course. Radiology: ordered and independent interpretation performed. Decision-making details documented in ED Course. ECG/medicine tests: ordered and independent interpretation performed. Decision-making details documented in ED Course.  Risk OTC drugs. Prescription drug management. Parenteral controlled substances. Decision regarding hospitalization. Diagnosis or treatment significantly limited by social determinants of health. Emergency major surgery. Risk Details: Patient to be admitted for acute esophageal variceal bleed as well as new onset A. fib with RVR   CHA2DS2-VASc 4 however active bleeding defer anticoagulation at this time        Final Clinical Impression(s) / ED Diagnoses Final diagnoses:  Upper GI bleed  Bleeding esophageal varices, unspecified esophageal varices type ([place])  New onset a-fib [facility])    Rx / DC Orders ED Discharge Orders     None         Shaeleigh Graw A, PA-C 09/23/21 1951    Lorelle Gibbs, DO 09/23/21 2323

## 2021-09-23 NOTE — Assessment & Plan Note (Signed)
Stable.  Patient states that she only drinks alcohol occasionally.

## 2021-09-23 NOTE — Subjective & Objective (Signed)
CC: Hematemesis History present illness: 66 year old female with a history of alcoholic cirrhosis, type 2 diabetes, history of lung cancer status post lobectomy, history of esophageal varices presents to the ER today with 1 day history of vomiting blood.  Patient states that she has vomited at least 9 or 10 times.  This is been a mixture of dark and bright red blood.  She endorses no abdominal pain.  She has been occasionally nauseated.  Patient with a history of esophageal varices requiring banding at [facility] years ago.  Patient states that she only drinks occasionally.  She states only has 1 drink on special occasions.  She is quit routine alcohol.  [facility]  She is not on the transplant list.  In the ER, she was observed to have multiple episodes of emesis.  Her vomitus tested positive for blood.  Patient's been seen by GI.  While in the ER, patient was noted to be tachycardic.  EDP states that she gave her adenosine which slowed the rhythm down to be observed to be rapid A. fib.  Cardizem drip was ordered but patient spontaneous converted to normal sinus rhythm.  [facility] hospitalist contacted for admission.

## 2021-09-23 NOTE — ED Triage Notes (Signed)
History of GI bleed per family, pt received blood with emergent situation.  Pt alert and oriented, skin dusky

## 2021-09-23 NOTE — Assessment & Plan Note (Signed)
Admit to progressive telemetry bed. IV protonix gtts and octreotide gtts. IV rocephin.  GI has already been consulted for EGD tonight.  Anesthesia is been consulted.  Appears that the patient had a bout of rapid A. fib that converted by itself.  Clearly in the face of her GI bleed, she cannot receive systemic anticoagulation.  Continue to monitor cardiac rhythm.

## 2021-09-23 NOTE — H&P (View-Only) (Signed)
Consultation  Referring Provider:     Lorelle Gibbs, DO [facility]) Primary Care Physician:  [name], MD Primary Gastroenterologist:      [facility] [place], [facility] Reason for Consultation:     Hematemesis         HPI:   Sandy [name] is a 66 y.o. female with a history of EtOH cirrhosis, stage IIIa non-small cell lung cancer s/p right lower lobectomy 12/2018 followed by postoperative adjuvant chemoradiation, diabetes, HTN, HLD, radiation-induced hypothyroidism, arthritis, IDA, chronic cough, presenting to the ER with coffee-ground emesis.    Coffee-ground emesis started ~7:00 this morning.  Had several episodes at home before presenting to the ER.  No associated abdominal pain.  Takes oral iron, so chronically has dark stools.  Otherwise no increase in stool frequency.  No associated CP, SOB.   She does have a known history of IDA.  Labs in October with H/H 7/24 and MCV/RDW 95/16 with ferritin 26, iron 17, TIBC 335, sat 5%.  Normal B12/folate.  She was treated with 1 unit PRBCs in 05/2021 then Venofer IV x3 weeks.  No repeat labs in the interim for review today.  Does not take any anticoagulants or antiplatelet therapy.  Last appointment in [place] was 06/02/2021.  Diagnosis of EtOH cirrhosis complicated by previous variceal bleed in 2016.  Continues to take nadolol 40 mg/day.  Not a candidate for liver transplant.  Does have an LR 3 hepatic lesion which has been stable on imaging since 01/2019, with plan for ongoing surveillance imaging.  RUQ [place] in 08/2021 with cirrhotic appearing liver, subcentimeter hypoechoic lesion in the left hepatic lobe.  - 08/2014: Hospital admission for acute variceal bleed.  Admitted to [facility] and transferred to [facility].  Intubated, transfused 9 units PRBCs and treated with octreotide, ABX.  EGD with EVL x4.  Good control of bleeding so TIPS was not performed. - 05/2015: EGD.  Report not available for review, but  patient does not think additional banding was performed - 09/2017: EGD: No esophageal varices   Labs on admission notable for the following: - WBC 4.1, H/H 13.6/40.2 with MCV/RDW 110/14.  PLT 188 - Na 137, K4.1, BUN/creatinine 9/0.76 - AST/ALT 68/31, T. bili 3.5, ALP 110, albumin 2.8 - PT/INR 18.3/1.5 - Lipase 35, lactate 3.1 - COVID-negative - Guaiac of emesis was positive  In the ER prior to my arrival, she developed SVT with HR in the 170/180s and palpitations.  Was treated with adenosine.  Rhythm was then A-fib with RVR with HR in the 120/130s.  BP remained stable.  Past Medical History:  Diagnosis Date   Adenocarcinoma of lung, stage 3, right ([place]) 2020   Arthritis    Asthma    Cirrhosis (HCC)    COPD (chronic obstructive pulmonary disease) (HCC)    Dyslipidemia    Dyspnea    Esophageal varices (HCC)    History of blood transfusion    History of hiatal hernia    Hypertension    Pneumonia    Type 2 diabetes mellitus ([place])    Type II    Past Surgical History:  Procedure Laterality Date   ABDOMINAL HYSTERECTOMY     ANTERIOR CRUCIATE LIGAMENT REPAIR Left    x 2   ESOPHAGOGASTRODUODENOSCOPY     LOBECTOMY Right 01/07/2019   Procedure: RIGHT LOWER LOBE LOBECTOMY AND INTERCOSTAL NERVE BLOCK;  Surgeon: Melrose Nakayama, MD;  Location: [place];  Service: Thoracic;  Laterality: Right;  VIDEO ASSISTED THORACOSCOPY (VATS)/WEDGE RESECTION Right 01/07/2019   Procedure: VIDEO ASSISTED THORACOSCOPY (VATS) RIGHT LOWER LOBE WEDGE RESECTION;  Surgeon: Melrose Nakayama, MD;  Location: [facility];  Service: Thoracic;  Laterality: Right;    Family History  Problem Relation Age of Onset   Leukemia Mother    Parkinson's disease Father    Hypertension Sister    Lung disease Neg Hx      Social History   Tobacco Use   Smoking status: Former    Packs/day: 1.00    Years: 37.00    Pack years: 37.00    Types: Cigarettes    Quit date: 08/2014    Years since quitting: 7.0    Smokeless tobacco: Never  Vaping Use   Vaping Use: Never used  Substance Use Topics   Alcohol use: Not Currently    Alcohol/week: 1.0 standard drink    Types: 1 Glasses of wine per week   Drug use: Not Currently    Types: Marijuana    Prior to Admission medications   Medication Sig Start Date End Date Taking? Authorizing Provider  atorvastatin (LIPITOR) 10 MG tablet Take 10 mg by mouth at bedtime.     [provider]  benzonatate (TESSALON) 100 MG capsule Take 1 capsule (100 mg total) by mouth 3 (three) times daily as needed for cough. Patient not taking: Reported on 06/29/2021 01/05/21   Melrose Nakayama, MD  FeFum-FePoly-FA-B Cmp-C-Biot (INTEGRA PLUS) CAPS TAKE 1 CAPSULE BY MOUTH DAILY 06/28/21   Curt Bears, MD  levothyroxine (SYNTHROID) 25 MCG tablet  09/04/19   [provider]  losartan (COZAAR) 100 MG tablet Take 1 tablet by mouth daily. 02/12/20   [provider]  metFORMIN (GLUCOPHAGE-XR) 500 MG 24 hr tablet Take 500 mg by mouth at bedtime.    [provider]  methylPREDNISolone (MEDROL DOSEPAK) 4 MG TBPK tablet Use as instructed Patient not taking: Reported on 06/29/2021 12/16/20   Curt Bears, MD  nadolol (CORGARD) 20 MG tablet Take 20 mg by mouth daily. 06/15/20   [provider]  omeprazole (PRILOSEC) 40 MG capsule Take 30- 60 min before your first and last meals of the day 05/06/20   Tanda Rockers, MD  ondansetron (ZOFRAN ODT) 8 MG disintegrating tablet Take 1 tablet (8 mg total) by mouth every 8 (eight) hours as needed for nausea or vomiting. Patient not taking: Reported on 06/29/2021 03/05/21   Lacretia Leigh, MD  spironolactone (ALDACTONE) 25 MG tablet TAKE 1 TABLET(25 MG) BY MOUTH IN THE MORNING Patient not taking: Reported on 06/29/2021 09/30/20   Rex Kras, DO    Current Facility-Administered Medications  Medication Dose Route Frequency Provider Last Rate Last Admin   0.9 %  sodium chloride infusion   Intravenous  Continuous Henderly, Britni A, PA-C 125 mL/hr at 09/23/21 1658 New Bag at 09/23/21 1658   diltiazem (CARDIZEM) 1 mg/mL load via infusion 10 mg  10 mg Intravenous Once Henderly, Britni A, PA-C       And   diltiazem (CARDIZEM) 125 mg in dextrose 5% 125 mL (1 mg/mL) infusion  5-15 mg/hr Intravenous Continuous Henderly, Britni A, PA-C   Held at 09/23/21 1906   octreotide (SANDOSTATIN) 500 mcg in sodium chloride 0.9 % 250 mL (2 mcg/mL) infusion  50 mcg/hr Intravenous Continuous Henderly, Britni A, PA-C 25 mL/hr at 09/23/21 1714 50 mcg/hr at 09/23/21 1714   [START ON 09/27/2021] pantoprazole (PROTONIX) injection 40 mg  40 mg Intravenous Q12H Henderly, Britni A, PA-C  pantoprozole (PROTONIX) 80 mg /NS 100 mL infusion  8 mg/hr Intravenous Continuous Henderly, Britni A, PA-C 10 mL/hr at 09/23/21 1720 8 mg/hr at 09/23/21 1720   Current Outpatient Medications  Medication Sig Dispense Refill   atorvastatin (LIPITOR) 10 MG tablet Take 10 mg by mouth at bedtime.      benzonatate (TESSALON) 100 MG capsule Take 1 capsule (100 mg total) by mouth 3 (three) times daily as needed for cough. (Patient not taking: Reported on 06/29/2021) 30 capsule 1   FeFum-FePoly-FA-B Cmp-C-Biot (INTEGRA PLUS) CAPS TAKE 1 CAPSULE BY MOUTH DAILY 30 capsule 4   levothyroxine (SYNTHROID) 25 MCG tablet      losartan (COZAAR) 100 MG tablet Take 1 tablet by mouth daily.     metFORMIN (GLUCOPHAGE-XR) 500 MG 24 hr tablet Take 500 mg by mouth at bedtime.     methylPREDNISolone (MEDROL DOSEPAK) 4 MG TBPK tablet Use as instructed (Patient not taking: Reported on 06/29/2021) 21 tablet 0   nadolol (CORGARD) 20 MG tablet Take 20 mg by mouth daily.     omeprazole (PRILOSEC) 40 MG capsule Take 30- 60 min before your first and last meals of the day 60 capsule 11   ondansetron (ZOFRAN ODT) 8 MG disintegrating tablet Take 1 tablet (8 mg total) by mouth every 8 (eight) hours as needed for nausea or vomiting. (Patient not taking: Reported on 06/29/2021)  20 tablet 0   spironolactone (ALDACTONE) 25 MG tablet TAKE 1 TABLET(25 MG) BY MOUTH IN THE MORNING (Patient not taking: Reported on 06/29/2021) 90 tablet 0    Allergies as of 09/23/2021 - Review Complete 09/23/2021  Allergen Reaction Noted   Codeine Nausea And Vomiting 12/03/2018   Nsaids  12/27/2018     Review of Systems:    As per HPI, otherwise negative    Physical Exam:  Vital signs in last 24 hours: Temp:  [97.7 F (36.5 C)] 97.7 F (36.5 C) (02/02 1559) Pulse Rate:  [109-116] 112 (02/02 1800) Resp:  [16-34] 24 (02/02 1800) BP: (130-140)/(68-84) 130/68 (02/02 1800) SpO2:  [98 %-100 %] 98 % (02/02 1800)   General:   Pleasant female in NAD, sitting upright and well conversive.  Empty emesis bag sitting in her lap Eyes:   No icterus.   Conjunctiva pink. Lungs:  Respirations even and unlabored. Lungs clear to auscultation bilaterally.   No wheezes, crackles, or rhonchi.  Heart: 3/6 SEM.  Tachycardic  Abdomen:  Soft, nondistended, nontender. Normal bowel sounds Extremities:  Without edema. Neurologic:  Alert and  oriented x4;  grossly normal neurologically. Psych:  Alert and cooperative. Normal affect.  LAB RESULTS: Recent Labs    09/23/21 1620 09/23/21 1704  WBC 4.1  --   HGB 13.6 13.6   13.9  HCT 40.2 40.0   41.0  PLT 188  --    BMET Recent Labs    09/23/21 1620 09/23/21 1704  NA 137 138   138  K 4.1 5.3*   5.3*  CL 102 99  CO2 26  --   GLUCOSE 141* 137*  BUN 9 13  CREATININE 0.76 0.60  CALCIUM 9.5  --    LFT Recent Labs    09/23/21 1620  PROT 7.9  ALBUMIN 2.8*  AST 68*  ALT 31  ALKPHOS 110  BILITOT 3.5*   PT/INR Recent Labs    09/23/21 1620  LABPROT 18.3*  INR 1.5*    STUDIES: DG Chest Portable 1 View  Result Date: 09/23/2021 CLINICAL DATA:  Shortness of breath  and vomiting blood. EXAM: PORTABLE CHEST 1 VIEW COMPARISON:  Multiple prior CT chest examinations as well as chest radiograph dated Dec 31, 2019 FINDINGS: Status post right  lobectomy with fibrosis in the right hilar region and mediastinal shift to the right is unchanged. Left lung is clear. Mild levoscoliosis of the thoracic spine. No acute osseous abnormality. IMPRESSION: Postsurgical changes with volume loss in the right lung and fibrosis in the right hilar region with mediastinal shift to the right are unchanged. Left lung is clear. No definite evidence of acute cardiopulmonary process. Electronically Signed   By: Keane Police D.O.   On: 09/23/2021 17:13        Impression / Plan:   1) Coffee-ground emesis 2) History of EtOH cirrhosis 3) History of esophageal varices 66 year old female with history of EtOH cirrhosis c/b variceal bleed in 2016 requiring endoscopic band ligation.  By her report available records, varices have been controlled with nadolol without need for serial banding.  Now presents with coffee-ground emesis, although hemoglobin improved from October (albeit in the setting of PRBC transfusion and IV iron x3) and normal [id number].  - Continue octreotide - Continue Rocephin - Continue n.p.o. - Okay to resume PPI for now given questionable etiology for bleed - Plan for EGD in the morning pending hemodynamic stability - Already on nadolol as outpatient  4) Tachycardia Patient developed SVT which broke with adenosine but converted to A-fib with RVR.  No prior known history of A-fib by available records.  HR 120s/130s, but otherwise BP stable.  I discussed her case with the Anesthesiologist on-call.  We agree with cardiac optimization overnight prior to sedation for EGD as above as she is otherwise clinically stable from a GI standpoint. - Continue management per Hospital service - Repeat electrolytes in the morning  5) Iron deficiency anemia - Received outpatient PRBC x1 and IV iron x3 in October/November 2022 - Inpatient EGD as above and likely defer colonoscopy to outpatient   6) History of lung cancer 7) Diabetes 8) Hypertension 9)  Hypothyroidism - Management per inpatient Hospital service  Dr. Lorenso Courier will assume her inpatient GI care tomorrow morning.  Please do not hesitate to contact me in the interim with questions or concerns.   Gerrit Heck, DO, [facility]    LOS: 0 days   Lavena Bullion  09/23/2021, 7:18 PM

## 2021-09-23 NOTE — Assessment & Plan Note (Signed)
Stable

## 2021-09-23 NOTE — Consult Note (Signed)
Consultation  Referring Provider:     Lorelle Gibbs, DO [facility]) Primary Care Physician:  [name], MD Primary Gastroenterologist:      [facility], [facility] Reason for Consultation:     Hematemesis         HPI:   Sandy [name] is a 66 y.o. female with a history of EtOH cirrhosis, stage IIIa non-small cell lung cancer s/p right lower lobectomy 12/2018 followed by postoperative adjuvant chemoradiation, diabetes, HTN, HLD, radiation-induced hypothyroidism, arthritis, IDA, chronic cough, presenting to the ER with coffee-ground emesis.    Coffee-ground emesis started ~7:00 this morning.  Had several episodes at home before presenting to the ER.  No associated abdominal pain.  Takes oral iron, so chronically has dark stools.  Otherwise no increase in stool frequency.  No associated CP, SOB.   She does have a known history of IDA.  Labs in October with H/H 7/24 and MCV/RDW 95/16 with ferritin 26, iron 17, TIBC 335, sat 5%.  Normal B12/folate.  She was treated with 1 unit PRBCs in 05/2021 then Venofer IV x3 weeks.  No repeat labs in the interim for review today.  Does not take any anticoagulants or antiplatelet therapy.  Last appointment in [place] was 06/02/2021.  Diagnosis of EtOH cirrhosis complicated by previous variceal bleed in 2016.  Continues to take nadolol 40 mg/day.  Not a candidate for liver transplant.  Does have an LR 3 hepatic lesion which has been stable on imaging since 01/2019, with plan for ongoing surveillance imaging.  RUQ [place] in 08/2021 with cirrhotic appearing liver, subcentimeter hypoechoic lesion in the left hepatic lobe.  - 08/2014: Hospital admission for acute variceal bleed.  Admitted to [facility] and transferred to [facility].  Intubated, transfused 9 units PRBCs and treated with octreotide, ABX.  EGD with EVL x4.  Good control of bleeding so TIPS was not performed. - 05/2015: EGD.  Report not available for review, but  patient does not think additional banding was performed - 09/2017: EGD: No esophageal varices   Labs on admission notable for the following: - WBC 4.1, H/H 13.6/40.2 with MCV/RDW 110/14.  PLT 188 - Na 137, K4.1, BUN/creatinine 9/0.76 - AST/ALT 68/31, T. bili 3.5, ALP 110, albumin 2.8 - PT/INR 18.3/1.5 - Lipase 35, lactate 3.1 - COVID-negative - Guaiac of emesis was positive  In the ER prior to my arrival, she developed SVT with HR in the 170/180s and palpitations.  Was treated with adenosine.  Rhythm was then A-fib with RVR with HR in the 120/130s.  BP remained stable.  Past Medical History:  Diagnosis Date   Adenocarcinoma of lung, stage 3, right ([place]) 2020   Arthritis    Asthma    Cirrhosis (HCC)    COPD (chronic obstructive pulmonary disease) (HCC)    Dyslipidemia    Dyspnea    Esophageal varices (HCC)    History of blood transfusion    History of hiatal hernia    Hypertension    Pneumonia    Type 2 diabetes mellitus ([place])    Type II    Past Surgical History:  Procedure Laterality Date   ABDOMINAL HYSTERECTOMY     ANTERIOR CRUCIATE LIGAMENT REPAIR Left    x 2   ESOPHAGOGASTRODUODENOSCOPY     LOBECTOMY Right 01/07/2019   Procedure: RIGHT LOWER LOBE LOBECTOMY AND INTERCOSTAL NERVE BLOCK;  Surgeon: Melrose Nakayama, MD;  Location: [place];  Service: Thoracic;  Laterality: Right;  VIDEO ASSISTED THORACOSCOPY (VATS)/WEDGE RESECTION Right 01/07/2019   Procedure: VIDEO ASSISTED THORACOSCOPY (VATS) RIGHT LOWER LOBE WEDGE RESECTION;  Surgeon: Melrose Nakayama, MD;  Location: [facility];  Service: Thoracic;  Laterality: Right;    Family History  Problem Relation Age of Onset   Leukemia Mother    Parkinson's disease Father    Hypertension Sister    Lung disease Neg Hx      Social History   Tobacco Use   Smoking status: Former    Packs/day: 1.00    Years: 37.00    Pack years: 37.00    Types: Cigarettes    Quit date: 08/2014    Years since quitting: 7.0    Smokeless tobacco: Never  Vaping Use   Vaping Use: Never used  Substance Use Topics   Alcohol use: Not Currently    Alcohol/week: 1.0 standard drink    Types: 1 Glasses of wine per week   Drug use: Not Currently    Types: Marijuana    Prior to Admission medications   Medication Sig Start Date End Date Taking? Authorizing Provider  atorvastatin (LIPITOR) 10 MG tablet Take 10 mg by mouth at bedtime.     [provider]  benzonatate (TESSALON) 100 MG capsule Take 1 capsule (100 mg total) by mouth 3 (three) times daily as needed for cough. Patient not taking: Reported on 06/29/2021 01/05/21   Melrose Nakayama, MD  FeFum-FePoly-FA-B Cmp-C-Biot (INTEGRA PLUS) CAPS TAKE 1 CAPSULE BY MOUTH DAILY 06/28/21   Curt Bears, MD  levothyroxine (SYNTHROID) 25 MCG tablet  09/04/19   [provider]  losartan (COZAAR) 100 MG tablet Take 1 tablet by mouth daily. 02/12/20   [provider]  metFORMIN (GLUCOPHAGE-XR) 500 MG 24 hr tablet Take 500 mg by mouth at bedtime.    [provider]  methylPREDNISolone (MEDROL DOSEPAK) 4 MG TBPK tablet Use as instructed Patient not taking: Reported on 06/29/2021 12/16/20   Curt Bears, MD  nadolol (CORGARD) 20 MG tablet Take 20 mg by mouth daily. 06/15/20   [provider]  omeprazole (PRILOSEC) 40 MG capsule Take 30- 60 min before your first and last meals of the day 05/06/20   Tanda Rockers, MD  ondansetron (ZOFRAN ODT) 8 MG disintegrating tablet Take 1 tablet (8 mg total) by mouth every 8 (eight) hours as needed for nausea or vomiting. Patient not taking: Reported on 06/29/2021 03/05/21   Lacretia Leigh, MD  spironolactone (ALDACTONE) 25 MG tablet TAKE 1 TABLET(25 MG) BY MOUTH IN THE MORNING Patient not taking: Reported on 06/29/2021 09/30/20   Rex Kras, DO    Current Facility-Administered Medications  Medication Dose Route Frequency Provider Last Rate Last Admin   0.9 %  sodium chloride infusion   Intravenous  Continuous Henderly, Britni A, PA-C 125 mL/hr at 09/23/21 1658 New Bag at 09/23/21 1658   diltiazem (CARDIZEM) 1 mg/mL load via infusion 10 mg  10 mg Intravenous Once Henderly, Britni A, PA-C       And   diltiazem (CARDIZEM) 125 mg in dextrose 5% 125 mL (1 mg/mL) infusion  5-15 mg/hr Intravenous Continuous Henderly, Britni A, PA-C   Held at 09/23/21 1906   octreotide (SANDOSTATIN) 500 mcg in sodium chloride 0.9 % 250 mL (2 mcg/mL) infusion  50 mcg/hr Intravenous Continuous Henderly, Britni A, PA-C 25 mL/hr at 09/23/21 1714 50 mcg/hr at 09/23/21 1714   [START ON 09/27/2021] pantoprazole (PROTONIX) injection 40 mg  40 mg Intravenous Q12H Henderly, Britni A, PA-C  pantoprozole (PROTONIX) 80 mg /NS 100 mL infusion  8 mg/hr Intravenous Continuous Henderly, Britni A, PA-C 10 mL/hr at 09/23/21 1720 8 mg/hr at 09/23/21 1720   Current Outpatient Medications  Medication Sig Dispense Refill   atorvastatin (LIPITOR) 10 MG tablet Take 10 mg by mouth at bedtime.      benzonatate (TESSALON) 100 MG capsule Take 1 capsule (100 mg total) by mouth 3 (three) times daily as needed for cough. (Patient not taking: Reported on 06/29/2021) 30 capsule 1   FeFum-FePoly-FA-B Cmp-C-Biot (INTEGRA PLUS) CAPS TAKE 1 CAPSULE BY MOUTH DAILY 30 capsule 4   levothyroxine (SYNTHROID) 25 MCG tablet      losartan (COZAAR) 100 MG tablet Take 1 tablet by mouth daily.     metFORMIN (GLUCOPHAGE-XR) 500 MG 24 hr tablet Take 500 mg by mouth at bedtime.     methylPREDNISolone (MEDROL DOSEPAK) 4 MG TBPK tablet Use as instructed (Patient not taking: Reported on 06/29/2021) 21 tablet 0   nadolol (CORGARD) 20 MG tablet Take 20 mg by mouth daily.     omeprazole (PRILOSEC) 40 MG capsule Take 30- 60 min before your first and last meals of the day 60 capsule 11   ondansetron (ZOFRAN ODT) 8 MG disintegrating tablet Take 1 tablet (8 mg total) by mouth every 8 (eight) hours as needed for nausea or vomiting. (Patient not taking: Reported on 06/29/2021)  20 tablet 0   spironolactone (ALDACTONE) 25 MG tablet TAKE 1 TABLET(25 MG) BY MOUTH IN THE MORNING (Patient not taking: Reported on 06/29/2021) 90 tablet 0    Allergies as of 09/23/2021 - Review Complete 09/23/2021  Allergen Reaction Noted   Codeine Nausea And Vomiting 12/03/2018   Nsaids  12/27/2018     Review of Systems:    As per HPI, otherwise negative    Physical Exam:  Vital signs in last 24 hours: Temp:  [97.7 F (36.5 C)] 97.7 F (36.5 C) (02/02 1559) Pulse Rate:  [109-116] 112 (02/02 1800) Resp:  [16-34] 24 (02/02 1800) BP: (130-140)/(68-84) 130/68 (02/02 1800) SpO2:  [98 %-100 %] 98 % (02/02 1800)   General:   Pleasant female in NAD, sitting upright and well conversive.  Empty emesis bag sitting in her lap Eyes:   No icterus.   Conjunctiva pink. Lungs:  Respirations even and unlabored. Lungs clear to auscultation bilaterally.   No wheezes, crackles, or rhonchi.  Heart: 3/6 SEM.  Tachycardic  Abdomen:  Soft, nondistended, nontender. Normal bowel sounds Extremities:  Without edema. Neurologic:  Alert and  oriented x4;  grossly normal neurologically. Psych:  Alert and cooperative. Normal affect.  LAB RESULTS: Recent Labs    09/23/21 1620 09/23/21 1704  WBC 4.1  --   HGB 13.6 13.6   13.9  HCT 40.2 40.0   41.0  PLT 188  --    BMET Recent Labs    09/23/21 1620 09/23/21 1704  NA 137 138   138  K 4.1 5.3*   5.3*  CL 102 99  CO2 26  --   GLUCOSE 141* 137*  BUN 9 13  CREATININE 0.76 0.60  CALCIUM 9.5  --    LFT Recent Labs    09/23/21 1620  PROT 7.9  ALBUMIN 2.8*  AST 68*  ALT 31  ALKPHOS 110  BILITOT 3.5*   PT/INR Recent Labs    09/23/21 1620  LABPROT 18.3*  INR 1.5*    STUDIES: DG Chest Portable 1 View  Result Date: 09/23/2021 CLINICAL DATA:  Shortness of breath  and vomiting blood. EXAM: PORTABLE CHEST 1 VIEW COMPARISON:  Multiple prior CT chest examinations as well as chest radiograph dated Dec 31, 2019 FINDINGS: Status post right  lobectomy with fibrosis in the right hilar region and mediastinal shift to the right is unchanged. Left lung is clear. Mild levoscoliosis of the thoracic spine. No acute osseous abnormality. IMPRESSION: Postsurgical changes with volume loss in the right lung and fibrosis in the right hilar region with mediastinal shift to the right are unchanged. Left lung is clear. No definite evidence of acute cardiopulmonary process. Electronically Signed   By: Keane Police D.O.   On: 09/23/2021 17:13        Impression / Plan:   1) Coffee-ground emesis 2) History of EtOH cirrhosis 3) History of esophageal varices 66 year old female with history of EtOH cirrhosis c/b variceal bleed in 2016 requiring endoscopic band ligation.  By her report available records, varices have been controlled with nadolol without need for serial banding.  Now presents with coffee-ground emesis, although hemoglobin improved from October (albeit in the setting of PRBC transfusion and IV iron x3) and normal [id number].  - Continue octreotide - Continue Rocephin - Continue n.p.o. - Okay to resume PPI for now given questionable etiology for bleed - Plan for EGD in the morning pending hemodynamic stability - Already on nadolol as outpatient  4) Tachycardia Patient developed SVT which broke with adenosine but converted to A-fib with RVR.  No prior known history of A-fib by available records.  HR 120s/130s, but otherwise BP stable.  I discussed her case with the Anesthesiologist on-call.  We agree with cardiac optimization overnight prior to sedation for EGD as above as she is otherwise clinically stable from a GI standpoint. - Continue management per Hospital service - Repeat electrolytes in the morning  5) Iron deficiency anemia - Received outpatient PRBC x1 and IV iron x3 in October/November 2022 - Inpatient EGD as above and likely defer colonoscopy to outpatient   6) History of lung cancer 7) Diabetes 8) Hypertension 9)  Hypothyroidism - Management per inpatient Hospital service  Dr. Lorenso Courier will assume her inpatient GI care tomorrow morning.  Please do not hesitate to contact me in the interim with questions or concerns.   Gerrit Heck, DO, [facility]    LOS: 0 days   Lavena Bullion  09/23/2021, 7:18 PM

## 2021-09-23 NOTE — H&P (Signed)
History and Physical    Sandy [name] EKB:[id number] DOB: 08/14/1956 DOA: 09/23/2021  DOS: the patient was seen and examined on 09/23/2021  PCP: [name], MD   Patient coming from: Home  I have personally briefly reviewed patient's old medical records in [place]  CC: Hematemesis History present illness: 66 year old female with a history of alcoholic cirrhosis, type 2 diabetes, history of lung cancer status post lobectomy, history of esophageal varices presents to the ER today with 1 day history of vomiting blood.  Patient states that she has vomited at least 9 or 10 times.  This is been a mixture of dark and bright red blood.  She endorses no abdominal pain.  She has been occasionally nauseated.  Patient with a history of esophageal varices requiring banding at [facility] years ago.  Patient states that she only drinks occasionally.  She states only has 1 drink on special occasions.  She is quit routine alcohol.  [facility]  She is not on the transplant list.  In the ER, she was observed to have multiple episodes of emesis.  Her vomitus tested positive for blood.  Patient's been seen by GI.  While in the ER, patient was noted to be tachycardic.  EDP states that she gave her adenosine which slowed the rhythm down to be observed to be rapid A. fib.  Cardizem drip was ordered but patient spontaneous converted to normal sinus rhythm.  [facility] hospitalist contacted for admission.   ED Course: vomiting blood in ER. Went into rapid afib. Spontaneously converted to NSR. GI consulted.  Review of Systems:  Review of Systems  Constitutional:  Positive for weight loss. Negative for chills and fever.       Pt states she has lost weight due to all her teeth being removed and not being able to eat solid food.  HENT: Negative.    Eyes: Negative.   Respiratory: Negative.    Cardiovascular: Negative.   Gastrointestinal:  Positive  for vomiting.       Vomiting blood. Pt states her stools are always dark because of taking oral iron  Genitourinary: Negative.   Musculoskeletal: Negative.   Skin: Negative.   Neurological: Negative.   Endo/Heme/Allergies: Negative.   Psychiatric/Behavioral: Negative.    All other systems reviewed and are negative.  Past Medical History:  Diagnosis Date   Adenocarcinoma of lung, stage 3, right ([place]) 2020   Arthritis    Asthma    Cirrhosis (HCC)    COPD (chronic obstructive pulmonary disease) (HCC)    Dyslipidemia    Dyspnea    Esophageal varices (HCC)    History of blood transfusion    History of hiatal hernia    Hypertension    Malignant neoplasm of bronchus of lower lobe, right ([place]) 01/10/2019   Pneumonia    Type 2 diabetes mellitus ([place])    Type II    Past Surgical History:  Procedure Laterality Date   ABDOMINAL HYSTERECTOMY     ANTERIOR CRUCIATE LIGAMENT REPAIR Left    x 2   ESOPHAGOGASTRODUODENOSCOPY     LOBECTOMY Right 01/07/2019   Procedure: RIGHT LOWER LOBE LOBECTOMY AND INTERCOSTAL NERVE BLOCK;  Surgeon: Melrose Nakayama, MD;  Location: [facility];  Service: Thoracic;  Laterality: Right;   VIDEO ASSISTED THORACOSCOPY (VATS)/WEDGE RESECTION Right 01/07/2019   Procedure: VIDEO ASSISTED THORACOSCOPY (VATS) RIGHT LOWER LOBE WEDGE RESECTION;  Surgeon: Melrose Nakayama, MD;  Location: [facility];  Service: Thoracic;  Laterality: Right;     reports that she quit smoking about 7 years ago. Her smoking use included cigarettes. She has a 37.00 pack-year smoking history. She has never used smokeless tobacco. She reports that she does not currently use alcohol after a past usage of about 1.0 standard drink per week. She reports that she does not currently use drugs after having used the following drugs: Marijuana.  Allergies  Allergen Reactions   Codeine Nausea And Vomiting   Nsaids     Avoid due to esophageal varices    Family History  Problem Relation Age of Onset    Leukemia Mother    Parkinson's disease Father    Hypertension Sister    Lung disease Neg Hx     Prior to Admission medications   Medication Sig Start Date End Date Taking? Authorizing Provider  atorvastatin (LIPITOR) 10 MG tablet Take 10 mg by mouth at bedtime.     [provider]  benzonatate (TESSALON) 100 MG capsule Take 1 capsule (100 mg total) by mouth 3 (three) times daily as needed for cough. Patient not taking: Reported on 06/29/2021 01/05/21   Melrose Nakayama, MD  FeFum-FePoly-FA-B Cmp-C-Biot (INTEGRA PLUS) CAPS TAKE 1 CAPSULE BY MOUTH DAILY 06/28/21   Curt Bears, MD  levothyroxine (SYNTHROID) 25 MCG tablet  09/04/19   [provider]  losartan (COZAAR) 100 MG tablet Take 1 tablet by mouth daily. 02/12/20   [provider]  metFORMIN (GLUCOPHAGE-XR) 500 MG 24 hr tablet Take 500 mg by mouth at bedtime.    [provider]  methylPREDNISolone (MEDROL DOSEPAK) 4 MG TBPK tablet Use as instructed Patient not taking: Reported on 06/29/2021 12/16/20   Curt Bears, MD  nadolol (CORGARD) 20 MG tablet Take 20 mg by mouth daily. 06/15/20   [provider]  omeprazole (PRILOSEC) 40 MG capsule Take 30- 60 min before your first and last meals of the day 05/06/20   Tanda Rockers, MD  ondansetron (ZOFRAN ODT) 8 MG disintegrating tablet Take 1 tablet (8 mg total) by mouth every 8 (eight) hours as needed for nausea or vomiting. Patient not taking: Reported on 06/29/2021 03/05/21   Lacretia Leigh, MD  spironolactone (ALDACTONE) 25 MG tablet TAKE 1 TABLET(25 MG) BY MOUTH IN THE MORNING Patient not taking: Reported on 06/29/2021 09/30/20   Rex Kras, DO    Physical Exam: Vitals:   09/23/21 1611 09/23/21 1730 09/23/21 1800 09/23/21 1900  BP:  137/75 130/68 109/78  Pulse:  (!) 109 (!) 112 (!) 122  Resp:  (!) 34 (!) 24 (!) 24  Temp:      SpO2: 98% 100% 98% 98%    Physical Exam Vitals and nursing note reviewed.  Constitutional:      General:  She is not in acute distress.    Appearance: She is normal weight. She is ill-appearing. She is not toxic-appearing.     Comments: Chronically ill appearing  HENT:     Head: Normocephalic and atraumatic.     Nose: Nose normal. No rhinorrhea.  Cardiovascular:     Rate and Rhythm: Regular rhythm. Tachycardia present.     Pulses: Normal pulses.  Pulmonary:     Effort: Pulmonary effort is normal. No respiratory distress.     Breath sounds: No wheezing or rales.  Abdominal:     General: Abdomen is flat. Bowel sounds are normal. There is no distension.     Tenderness: There is no abdominal tenderness. There is no guarding.  Musculoskeletal:     Right lower leg: No edema.     Left lower leg: No edema.  Skin:    General: Skin is warm and dry.     Capillary Refill: Capillary refill takes less than 2 seconds.  Neurological:     General: No focal deficit present.     Mental Status: She is alert and oriented to person, place, and time.     Labs on Admission: I have personally reviewed following labs and imaging studies  CBC: Recent Labs  Lab 09/23/21 1620 09/23/21 1704  WBC 4.1  --   NEUTROABS 2.6  --   HGB 13.6 13.6   13.9  HCT 40.2 40.0   41.0  MCV 110.7*  --   PLT 188  --    Basic Metabolic Panel: Recent Labs  Lab 09/23/21 1620 09/23/21 1704  NA 137 138   138  K 4.1 5.3*   5.3*  CL 102 99  CO2 26  --   GLUCOSE 141* 137*  BUN 9 13  CREATININE 0.76 0.60  CALCIUM 9.5  --    GFR: CrCl cannot be calculated (Unknown ideal weight.). Liver Function Tests: Recent Labs  Lab 09/23/21 1620  AST 68*  ALT 31  ALKPHOS 110  BILITOT 3.5*  PROT 7.9  ALBUMIN 2.8*   Recent Labs  Lab 09/23/21 1620  LIPASE 35   No results for input(s): AMMONIA in the last 168 hours. Coagulation Profile: Recent Labs  Lab 09/23/21 1620  INR 1.5*   Cardiac Enzymes: No results for input(s): CKTOTAL, CKMB, CKMBINDEX, TROPONINI in the last 168 hours. BNP (last 3 results) No results for  input(s): PROBNP in the last 8760 hours. HbA1C: No results for input(s): HGBA1C in the last 72 hours. CBG: No results for input(s): GLUCAP in the last 168 hours. Lipid Profile: No results for input(s): CHOL, HDL, LDLCALC, TRIG, CHOLHDL, LDLDIRECT in the last 72 hours. Thyroid Function Tests: No results for input(s): TSH, T4TOTAL, FREET4, T3FREE, THYROIDAB in the last 72 hours. Anemia Panel: No results for input(s): VITAMINB12, FOLATE, FERRITIN, TIBC, IRON, RETICCTPCT in the last 72 hours. Urine analysis:    Component Value Date/Time   COLORURINE YELLOW 03/05/2021 1221   APPEARANCEUR CLEAR 03/05/2021 1221   LABSPEC 1.014 03/05/2021 1221   PHURINE 9.0 (H) 03/05/2021 1221   GLUCOSEU NEGATIVE 03/05/2021 1221   HGBUR SMALL (A) 03/05/2021 1221   BILIRUBINUR NEGATIVE 03/05/2021 1221   KETONESUR NEGATIVE 03/05/2021 1221   PROTEINUR NEGATIVE 03/05/2021 1221   NITRITE NEGATIVE 03/05/2021 1221   LEUKOCYTESUR NEGATIVE 03/05/2021 1221    Radiological Exams on Admission: I have personally reviewed images DG Chest Portable 1 View  Result Date: 09/23/2021 CLINICAL DATA:  Shortness of breath and vomiting blood. EXAM: PORTABLE CHEST 1 VIEW COMPARISON:  Multiple prior CT chest examinations as well as chest radiograph dated Dec 31, 2019 FINDINGS: Status post right lobectomy with fibrosis in the right hilar region and mediastinal shift to the right is unchanged. Left lung is clear. Mild levoscoliosis of the thoracic spine. No acute osseous abnormality. IMPRESSION: Postsurgical changes with volume loss in the right lung and fibrosis in the right hilar region with mediastinal shift to the right are unchanged. Left lung is clear. No definite evidence of acute cardiopulmonary process. Electronically Signed   By: Keane Police D.O.   On: 09/23/2021 17:13    EKG: I have personally reviewed EKG: rapid afib    Assessment/Plan Principal Problem:   Hematemesis Active Problems:  History of esophageal  varices   Type 2 diabetes mellitus (HCC)   Alcoholic cirrhosis (HCC)   History of lung cancer    Assessment and Plan: * Hematemesis- (present on admission) Admit to progressive telemetry bed. IV protonix gtts and octreotide gtts. IV rocephin.  GI has already been consulted for EGD tonight.  Anesthesia is been consulted.  Appears that the patient had a bout of rapid A. fib that converted by itself.  Clearly in the face of her GI bleed, she cannot receive systemic anticoagulation.  Continue to monitor cardiac rhythm.  History of esophageal varices Patient with a history of esophageal varices with bleeding.  She states this was several years ago.  She was treated at [facility].  History of lung cancer Stable.  Alcoholic cirrhosis ([place])- (present on admission) Stable.  Patient states that she only drinks alcohol occasionally.  Type 2 diabetes mellitus (HCC) Hold metformin.  Check A1c.  Sliding scale every 6 hours.    DVT prophylaxis: SCDs Code Status: Full Code Family Communication: no family at bedside  Disposition Plan: return home  Consults called: GI  Admission status: Inpatient,  progressive   Kristopher Oppenheim, DO [facility] 09/23/2021, 7:36 PM

## 2021-09-23 NOTE — Anesthesia Preprocedure Evaluation (Addendum)
Anesthesia Evaluation  Patient identified by MRN, date of birth, ID band Patient awake    Reviewed: Allergy & Precautions, NPO status , Patient's Chart, lab work & pertinent test results  History of Anesthesia Complications Negative for: history of anesthetic complications  Airway Mallampati: I  TM Distance: >3 FB Neck ROM: Full    Dental no notable dental hx. (+) Edentulous Upper, Edentulous Lower, Dental Advisory Given   Pulmonary COPD, former smoker,  S/p VATS for adenoCa R lung   Pulmonary exam normal        Cardiovascular hypertension, Pt. on medications and Pt. on home beta blockers  Rhythm:Regular Rate:Normal  Echocardiogram 09/04/2020: LV is normal in size and wall thickness. Normal global wall motion. Normal LV systolic function with visual EF 50-55%. Mild to moderate AI.  Mild calcification of the mitral valve annulus. Trace MR.  Mild TR. Estimated pulmonary artery systolic pressure  21 mmHg. Small to medium pericardial effusion   08/31/2020 stress: Myocardial perfusion is normal. Overall LV systolic function is normal without regional wall motion abnormalities. Stress LV EF: 66%.  No previous exam available for comparison. Low risk    Neuro/Psych    GI/Hepatic hiatal hernia, GERD  Medicated,(+) Cirrhosis   Esophageal Varices  substance abuse  alcohol use, Bleeding varices   Endo/Other  diabetes, Oral Hypoglycemic AgentsHypothyroidism   Renal/GU      Musculoskeletal  (+) Arthritis ,   Abdominal   Peds  Hematology INR 1.5   Anesthesia Other Findings   Reproductive/Obstetrics                            Anesthesia Physical Anesthesia Plan  ASA: 4 and emergent  Anesthesia Plan: General   Post-op Pain Management: Minimal or no pain anticipated   Induction: Intravenous  PONV Risk Score and Plan: 3 and Ondansetron, Dexamethasone and Treatment may vary due to age or medical  condition  Airway Management Planned: Oral ETT  Additional Equipment: None  Intra-op Plan:   Post-operative Plan: Extubation in OR  Informed Consent: I have reviewed the patients History and Physical, chart, labs and discussed the procedure including the risks, benefits and alternatives for the proposed anesthesia with the patient or authorized representative who has indicated his/her understanding and acceptance.     Dental advisory given  Plan Discussed with: CRNA and Anesthesiologist  Anesthesia Plan Comments:       Anesthesia Quick Evaluation

## 2021-09-23 NOTE — Assessment & Plan Note (Signed)
Hold metformin.  Check A1c.  Sliding scale every 6 hours.

## 2021-09-24 ENCOUNTER — Encounter (HOSPITAL_COMMUNITY): Payer: Self-pay | Admitting: Internal Medicine

## 2021-09-24 ENCOUNTER — Encounter (HOSPITAL_COMMUNITY)
Admission: EM | Disposition: A | Discharge status: Home / Self Care | Payer: Self-pay | Source: Home / Self Care | Attending: Internal Medicine

## 2021-09-24 ENCOUNTER — Inpatient Hospital Stay (HOSPITAL_COMMUNITY): Payer: Medicare Other | Admitting: Anesthesiology

## 2021-09-24 DIAGNOSIS — K317 Polyp of stomach and duodenum: Secondary | ICD-10-CM

## 2021-09-24 DIAGNOSIS — K920 Hematemesis: Secondary | ICD-10-CM | POA: Diagnosis not present

## 2021-09-24 DIAGNOSIS — K766 Portal hypertension: Secondary | ICD-10-CM

## 2021-09-24 DIAGNOSIS — K31819 Angiodysplasia of stomach and duodenum without bleeding: Secondary | ICD-10-CM

## 2021-09-24 HISTORY — PX: HEMOSTASIS CONTROL: SHX6838

## 2021-09-24 HISTORY — PX: ESOPHAGOGASTRODUODENOSCOPY: SHX5428

## 2021-09-24 LAB — CBC
HCT: 31 % — ABNORMAL LOW (ref 36.0–46.0)
HCT: 28.3 % — ABNORMAL LOW (ref 36.0–46.0)
Hemoglobin: 9.8 g/dL — ABNORMAL LOW (ref 12.0–15.0)
Hemoglobin: 9.6 g/dL — ABNORMAL LOW (ref 12.0–15.0)
MCH: 36.4 pg — ABNORMAL HIGH (ref 26.0–34.0)
MCH: 38.6 pg — ABNORMAL HIGH (ref 26.0–34.0)
MCHC: 31.6 g/dL (ref 30.0–36.0)
MCHC: 33.9 g/dL (ref 30.0–36.0)
MCV: 115.2 fL — ABNORMAL HIGH (ref 80.0–100.0)
MCV: 113.7 fL — ABNORMAL HIGH (ref 80.0–100.0)
Platelets: 118 10*3/uL — ABNORMAL LOW (ref 150–400)
Platelets: 91 10*3/uL — ABNORMAL LOW (ref 150–400)
RBC: 2.69 MIL/uL — ABNORMAL LOW (ref 3.87–5.11)
RBC: 2.49 MIL/uL — ABNORMAL LOW (ref 3.87–5.11)
RDW: 14.6 % (ref 11.5–15.5)
RDW: 14.2 % (ref 11.5–15.5)
WBC: 1.7 10*3/uL — ABNORMAL LOW (ref 4.0–10.5)
WBC: 1.2 10*3/uL — CL (ref 4.0–10.5)
nRBC: 0 % (ref 0.0–0.2)
nRBC: 0 % (ref 0.0–0.2)

## 2021-09-24 LAB — MAGNESIUM: Magnesium: 1.3 mg/dL — ABNORMAL LOW (ref 1.7–2.4)

## 2021-09-24 LAB — CBC WITH DIFFERENTIAL/PLATELET
Abs Immature Granulocytes: 0.02 10*3/uL (ref 0.00–0.07)
Basophils Absolute: 0 10*3/uL (ref 0.0–0.1)
Basophils Relative: 1 %
Eosinophils Absolute: 0.2 10*3/uL (ref 0.0–0.5)
Eosinophils Relative: 7 %
HCT: 31.5 % — ABNORMAL LOW (ref 36.0–46.0)
Hemoglobin: 10.5 g/dL — ABNORMAL LOW (ref 12.0–15.0)
Immature Granulocytes: 1 %
Lymphocytes Relative: 17 %
Lymphs Abs: 0.5 10*3/uL — ABNORMAL LOW (ref 0.7–4.0)
MCH: 37.5 pg — ABNORMAL HIGH (ref 26.0–34.0)
MCHC: 33.3 g/dL (ref 30.0–36.0)
MCV: 112.5 fL — ABNORMAL HIGH (ref 80.0–100.0)
Monocytes Absolute: 0.4 10*3/uL (ref 0.1–1.0)
Monocytes Relative: 14 %
Neutro Abs: 1.8 10*3/uL (ref 1.7–7.7)
Neutrophils Relative %: 60 %
Platelets: 136 10*3/uL — ABNORMAL LOW (ref 150–400)
RBC: 2.8 MIL/uL — ABNORMAL LOW (ref 3.87–5.11)
RDW: 14.4 % (ref 11.5–15.5)
WBC: 3 10*3/uL — ABNORMAL LOW (ref 4.0–10.5)
nRBC: 0 % (ref 0.0–0.2)

## 2021-09-24 LAB — COMPREHENSIVE METABOLIC PANEL
ALT: 25 U/L (ref 0–44)
AST: 54 U/L — ABNORMAL HIGH (ref 15–41)
Albumin: 2.4 g/dL — ABNORMAL LOW (ref 3.5–5.0)
Alkaline Phosphatase: 89 U/L (ref 38–126)
Anion gap: 11 (ref 5–15)
BUN: 13 mg/dL (ref 8–23)
CO2: 22 mmol/L (ref 22–32)
Calcium: 8.1 mg/dL — ABNORMAL LOW (ref 8.9–10.3)
Chloride: 107 mmol/L (ref 98–111)
Creatinine, Ser: 0.85 mg/dL (ref 0.44–1.00)
GFR, Estimated: >60 mL/min (ref >60)
Glucose, Bld: 118 mg/dL — ABNORMAL HIGH (ref 70–99)
Potassium: 4.1 mmol/L (ref 3.5–5.1)
Sodium: 140 mmol/L (ref 135–145)
Total Bilirubin: 4.2 mg/dL — ABNORMAL HIGH (ref 0.3–1.2)
Total Protein: 6.5 g/dL (ref 6.5–8.1)

## 2021-09-24 LAB — GLUCOSE, CAPILLARY
Glucose-Capillary: 118 mg/dL — ABNORMAL HIGH (ref 70–99)
Glucose-Capillary: 131 mg/dL — ABNORMAL HIGH (ref 70–99)
Glucose-Capillary: 147 mg/dL — ABNORMAL HIGH (ref 70–99)
Glucose-Capillary: 168 mg/dL — ABNORMAL HIGH (ref 70–99)
Glucose-Capillary: 169 mg/dL — ABNORMAL HIGH (ref 70–99)

## 2021-09-24 LAB — HIV ANTIBODY (ROUTINE TESTING W REFLEX): HIV Screen 4th Generation wRfx: NONREACTIVE

## 2021-09-24 LAB — HEMOGLOBIN AND HEMATOCRIT, BLOOD
HCT: 32.5 % — ABNORMAL LOW (ref 36.0–46.0)
Hemoglobin: 10.9 g/dL — ABNORMAL LOW (ref 12.0–15.0)

## 2021-09-24 LAB — CBG MONITORING, ED: Glucose-Capillary: 140 mg/dL — ABNORMAL HIGH (ref 70–99)

## 2021-09-24 LAB — AMMONIA: Ammonia: 56 umol/L — ABNORMAL HIGH (ref 9–35)

## 2021-09-24 LAB — BRAIN NATRIURETIC PEPTIDE: B Natriuretic Peptide: 625.1 pg/mL — ABNORMAL HIGH (ref 0.0–100.0)

## 2021-09-24 SURGERY — EGD (ESOPHAGOGASTRODUODENOSCOPY)
Anesthesia: General | Laterality: Left

## 2021-09-24 MED ORDER — DIPHENHYDRAMINE HCL 50 MG/ML IJ SOLN
25.0000 mg | Freq: Once | INTRAMUSCULAR | Status: AC
  Administered 2021-09-24: 25 mg via INTRAVENOUS
  Filled 2021-09-24: qty 1

## 2021-09-24 MED ORDER — NADOLOL 20 MG PO TABS
20.0000 mg | ORAL_TABLET | Freq: Every evening | ORAL | Status: DC
  Administered 2021-09-24 – 2021-09-25 (×2): 20 mg via ORAL
  Filled 2021-09-24 (×3): qty 1

## 2021-09-24 MED ORDER — LIDOCAINE 2% (20 MG/ML) 5 ML SYRINGE
INTRAMUSCULAR | Status: DC | PRN
  Administered 2021-09-24: 100 mg via INTRAVENOUS

## 2021-09-24 MED ORDER — SUCCINYLCHOLINE CHLORIDE 200 MG/10ML IV SOSY
PREFILLED_SYRINGE | INTRAVENOUS | Status: DC | PRN
  Administered 2021-09-24: 100 mg via INTRAVENOUS

## 2021-09-24 MED ORDER — ATORVASTATIN CALCIUM 10 MG PO TABS
10.0000 mg | ORAL_TABLET | Freq: Every day | ORAL | Status: DC
  Administered 2021-09-24 – 2021-09-25 (×2): 10 mg via ORAL
  Filled 2021-09-24 (×2): qty 1

## 2021-09-24 MED ORDER — LACTATED RINGERS IV SOLN: INTRAVENOUS | Status: DC | PRN

## 2021-09-24 MED ORDER — MIDAZOLAM HCL 2 MG/2ML IJ SOLN
INTRAMUSCULAR | Status: DC | PRN
  Administered 2021-09-24: 2 mg via INTRAVENOUS

## 2021-09-24 MED ORDER — POLYSACCHARIDE IRON COMPLEX 150 MG PO CAPS
ORAL_CAPSULE | Freq: Every day | ORAL | Status: DC
  Administered 2021-09-24 – 2021-09-25 (×2): 150 mg via ORAL
  Filled 2021-09-24 (×3): qty 1

## 2021-09-24 MED ORDER — PHENYLEPHRINE 40 MCG/ML (10ML) SYRINGE FOR IV PUSH (FOR BLOOD PRESSURE SUPPORT)
PREFILLED_SYRINGE | INTRAVENOUS | Status: DC | PRN
  Administered 2021-09-24: 160 ug via INTRAVENOUS
  Administered 2021-09-24: 200 ug via INTRAVENOUS
  Administered 2021-09-24: 80 ug via INTRAVENOUS

## 2021-09-24 MED ORDER — FENTANYL CITRATE (PF) 250 MCG/5ML IJ SOLN
INTRAMUSCULAR | Status: DC | PRN
Start: 2021-09-24 — End: 2021-09-24
  Administered 2021-09-24: 25 ug via INTRAVENOUS

## 2021-09-24 MED ORDER — DEXAMETHASONE SODIUM PHOSPHATE 10 MG/ML IJ SOLN
INTRAMUSCULAR | Status: DC | PRN
  Administered 2021-09-24: 4 mg via INTRAVENOUS

## 2021-09-24 MED ORDER — LEVOTHYROXINE SODIUM 25 MCG PO TABS
25.0000 ug | ORAL_TABLET | Freq: Every day | ORAL | Status: DC
  Administered 2021-09-25: 25 ug via ORAL
  Filled 2021-09-24: qty 1

## 2021-09-24 MED ORDER — FAMOTIDINE IN NACL 20-0.9 MG/50ML-% IV SOLN
20.0000 mg | Freq: Once | INTRAVENOUS | Status: AC
  Administered 2021-09-24: 20 mg via INTRAVENOUS
  Filled 2021-09-24: qty 50

## 2021-09-24 MED ORDER — METHYLPREDNISOLONE SODIUM SUCC 40 MG IJ SOLR
20.0000 mg | Freq: Once | INTRAMUSCULAR | Status: AC
  Administered 2021-09-24: 20 mg via INTRAVENOUS
  Filled 2021-09-24: qty 1

## 2021-09-24 MED ORDER — DIPHENHYDRAMINE HCL 50 MG/ML IJ SOLN
25.0000 mg | Freq: Four times a day (QID) | INTRAMUSCULAR | Status: DC | PRN
  Administered 2021-09-24 – 2021-09-26 (×4): 25 mg via INTRAVENOUS
  Filled 2021-09-24 (×4): qty 1

## 2021-09-24 MED ORDER — METHYLPREDNISOLONE SODIUM SUCC 125 MG IJ SOLR
INTRAMUSCULAR | Status: AC
  Filled 2021-09-24: qty 2

## 2021-09-24 MED ORDER — SODIUM CHLORIDE 0.9 % IV SOLN: INTRAVENOUS | Status: DC

## 2021-09-24 MED ORDER — PHENYLEPHRINE HCL-NACL 20-0.9 MG/250ML-% IV SOLN
INTRAVENOUS | Status: DC | PRN
  Administered 2021-09-24: 80 ug/min via INTRAVENOUS

## 2021-09-24 MED ORDER — CIPROFLOXACIN IN D5W 400 MG/200ML IV SOLN
400.0000 mg | Freq: Two times a day (BID) | INTRAVENOUS | Status: DC
  Administered 2021-09-24 – 2021-09-26 (×5): 400 mg via INTRAVENOUS
  Filled 2021-09-24 (×6): qty 200

## 2021-09-24 MED ORDER — PROPOFOL 10 MG/ML IV BOLUS
INTRAVENOUS | Status: DC | PRN
  Administered 2021-09-24: 60 mg via INTRAVENOUS

## 2021-09-24 MED ORDER — HYDROCORTISONE 1 % EX CREA
TOPICAL_CREAM | Freq: Three times a day (TID) | CUTANEOUS | Status: DC | PRN
  Administered 2021-09-24: 1 via TOPICAL
  Filled 2021-09-24 (×3): qty 28

## 2021-09-24 MED ORDER — ENSURE ENLIVE PO LIQD
237.0000 mL | Freq: Three times a day (TID) | ORAL | Status: DC
  Administered 2021-09-25 (×2): 237 mL via ORAL

## 2021-09-24 MED ORDER — ONDANSETRON HCL 4 MG/2ML IJ SOLN
INTRAMUSCULAR | Status: DC | PRN
Start: 2021-09-24 — End: 2021-09-24
  Administered 2021-09-24: 4 mg via INTRAVENOUS

## 2021-09-24 NOTE — Plan of Care (Signed)
°  Problem: Safety: Goal: Ability to remain free from injury will improve Outcome: Progressing   Problem: Skin Integrity: Goal: Risk for impaired skin integrity will decrease Outcome: Not Applicable

## 2021-09-24 NOTE — Op Note (Signed)
[facility] Patient Name: Sandy [name] Procedure Date : 09/24/2021 MRN: [id number] Attending MD: Georgian Co ,  Date of Birth: [birth year] CSN: [id number] Age: 66 Admit Type: Inpatient Procedure:                Upper GI endoscopy Indications:              Coffee-ground emesis Providers:                Ervin Knack, RN, Cletis Athens, Technician, Trixie Deis, CRNA, Sonny Masters "Christia Reading Referring MD:             Hospitalist team Medicines:                Monitored Anesthesia Care Complications:            No immediate complications. Estimated Blood Loss:     Estimated blood loss was minimal. Procedure:                Pre-Anesthesia Assessment:                           - Prior to the procedure, a History and Physical                            was performed, and patient medications and                            allergies were reviewed. The patient's tolerance of                            previous anesthesia was also reviewed. The risks                            and benefits of the procedure and the sedation                            options and risks were discussed with the patient.                            All questions were answered, and informed consent                            was obtained. Prior Anticoagulants: The patient has                            taken no previous anticoagulant or antiplatelet                            agents. ASA Grade Assessment: III - A patient with                            severe systemic disease. After reviewing the risks  and benefits, the patient was deemed in                            satisfactory condition to undergo the procedure.                           After obtaining informed consent, the endoscope was                            passed under direct vision. Throughout the                            procedure, the patient's blood pressure, pulse, and                             oxygen saturations were monitored continuously. The                            [id number]-[id number] ([id number]) Olympus endoscope was introduced                            through the mouth, and advanced to the second part                            of duodenum. The upper GI endoscopy was                            accomplished without difficulty. The patient                            tolerated the procedure well. Scope In: Scope Out: Findings:      LA Grade D (one or more mucosal breaks involving at least 75% of       esophageal circumference) esophagitis with no bleeding was found in the       distal esophagus.      Portal hypertensive gastropathy was found in the gastric body.      A few sessile polyps with no bleeding and no stigmata of recent bleeding       were found in the gastric body.      A single 3 mm angioectasia without bleeding was found in the second       portion of the duodenum. Coagulation for tissue destruction using argon       plasma at 0.8 liters/minute and 20 watts was successful. Impression:               - LA Grade D esophagitis with no bleeding.                           - Portal hypertensive gastropathy.                           - A few gastric polyps.                           - A single non-bleeding angioectasia in the  duodenum. Treated with argon plasma coagulation                            (APC).                           - No specimens collected. Recommendation:           - Return patient to hospital ward for ongoing care.                           - Use a proton pump inhibitor PO BID for 8 weeks.                           - Complete 7 days of antibiotic therapy for SBP PPx                            in the setting of GI bleed                           - Okay to stop octreotide.                           - Repeat upper endoscopy in 8 weeks to check                            healing. Can follow up with her hepatologist at                             [place] to get this done.                           - Avoid alcohol intake if possible.                           - The findings and recommendations were discussed                            with the patient and/or patient's family. Procedure Code(s):        --- Professional ---                           [phone], Esophagogastroduodenoscopy, flexible,                            transoral; with ablation of tumor(s), polyp(s), or                            other lesion(s) (includes pre- and post-dilation                            and guide wire passage, when performed) Diagnosis Code(s):        --- Professional ---  K20.90, Esophagitis, unspecified without bleeding                           K76.6, Portal hypertension                           K31.89, Other diseases of stomach and duodenum                           K31.7, Polyp of stomach and duodenum                           K31.819, Angiodysplasia of stomach and duodenum                            without bleeding                           K92.0, Hematemesis CPT copyright 2019 [employer]. All rights reserved. The codes documented in this report are preliminary and upon coder review may  be revised to meet current compliance requirements. Sonny Masters "Christia Reading,  09/24/2021 10:41:07 AM Number of Addenda: 0

## 2021-09-24 NOTE — Progress Notes (Signed)
PROGRESS NOTE                                                                                                                                                                                                             Patient Demographics:    Sandy [name], is a 66 y.o. female, DOB - 10-11-1955, FXT:[id number]  Outpatient Primary MD for the patient is [name], MD    LOS - 1  Admit date - 09/23/2021    Chief Complaint  Patient presents with   Emesis       Brief Narrative (HPI from H&P)  - 66 year old female with a history of alcoholic cirrhosis, type 2 diabetes, history of lung cancer status post lobectomy, history of esophageal varices presents to the ER today with 1 day history of vomiting blood.  In the ER she was diagnosed with upper GI bleed, she also had a brief run of SVT which resolved after she got a dose of adenosine.  She was admitted for work-up of GI bleed   Subjective:    Sandy [name] today has, No headache, No chest pain, No abdominal pain - No Nausea, No new weakness tingling or numbness, no shortness of breath.   Assessment  & Plan :    Upper GI bleed with hematemesis causing acute blood loss related anemia.  In a patient with known history of alcoholic cirrhosis and esophageal varices who still drinking alcohol.  H&H for now although has dropped but stable, continue to monitor transfuse if drops below 8, has been seen by GI underwent EGD on 09/24/2021 showing LA Grade D esophagitis with no bleeding,  Portal hypertensive gastropathy, few gastric polyps& a single non-bleeding angioectasia in the duodenum.  She has been treated with argon plasma coagulation, continue PPI for 8 weeks and antibiotic SBP prophylaxis for a week.  Thereafter follow-up with her primary GI within of 1 month.  For repeat EGD to document healing.  Home dose beta-blocker for esophageal varices resumed.  2.  Ongoing alcohol  abuse with alcoholic cirrhosis.  Says she drinks now 1-2 drinks a week.  Counseled to quit.  Monitor for any withdrawal.  3.  Past history of lung cancer.  Supportive care.  4.  Dyslipidemia.  On statin.  5.  Hypothyroidism.  On Synthroid.  6.  Allergic reaction to Rocephin with  itching.  Supportive care, Benadryl, steroids and steroid cream   7.  Essential hypertension.  For now beta-blocker only.    8.  Run of SVT in the ER.  Resolved after adenosine, check TSH and monitor on beta-blocker   9. DM type II.  Sliding scale.  CBG (last 3)  Recent Labs    09/24/21 0548 09/24/21 1035 09/24/21 1213  GLUCAP 118* 147* 169*    Lab Results  Component Value Date   HGBA1C 4.3 (L) 09/23/2021        Condition - Extremely Guarded  Family Communication  :  none present  Code Status :  Full  Consults  :  GI  PUD Prophylaxis : PPI   Procedures  :     EGD -   Impression:               - LA Grade D esophagitis with no bleeding.                           - Portal hypertensive gastropathy.                           - A few gastric polyps.                           - A single non-bleeding angioectasia in the                            duodenum. Treated with argon plasma coagulation (APC).                           - No specimens collected.  Recommendation:            - Return patient to hospital ward for ongoing care.                           - Use a proton pump inhibitor PO BID for 8 weeks.                           - Complete 7 days of antibiotic therapy for SBP PPx                            in the setting of GI bleed                           - Okay to stop octreotide.                           - Repeat upper endoscopy in 8 weeks to check                            healing. Can follow up with her hepatologist at                            [facility] to get this done.                           -  Avoid alcohol intake      Disposition Plan  :    Status is: Inpatient - UGI  Bleed   DVT Prophylaxis  :    Place and maintain sequential compression device Start: 09/24/21 0536 SCDs Start: 09/23/21 2027     Lab Results  Component Value Date   PLT 136 (L) 09/24/2021    Diet :  Diet Order             Diet clear liquid Room service appropriate? Yes; Fluid consistency: Thin  Diet effective now                    Inpatient Medications  Scheduled Meds:  insulin aspart  0-9 Units Subcutaneous Q6H   methylPREDNISolone (SOLU-MEDROL) injection  20 mg Intravenous Once   Continuous Infusions:  sodium chloride 75 mL/hr at 09/24/21 1126   ciprofloxacin 400 mg (09/24/21 0815)   pantoprazole 8 mg/hr (09/24/21 0505)   PRN Meds:.diphenhydrAMINE, hydrocortisone cream, ondansetron **OR** ondansetron (ZOFRAN) IV  Antibiotics  :    Anti-infectives (From admission, onward)    Start     Dose/Rate Route Frequency Ordered Stop   09/24/21 0900  cefTRIAXone (ROCEPHIN) 1 g in sodium chloride 0.9 % 100 mL IVPB  Status:  Discontinued        1 g 200 mL/hr over 30 Minutes Intravenous Every 24 hours 09/23/21 2026 09/24/21 0510   09/24/21 0600  ciprofloxacin (CIPRO) IVPB 400 mg        400 mg 200 mL/hr over 60 Minutes Intravenous Every 12 hours 09/24/21 0512     09/23/21 1630  cefTRIAXone (ROCEPHIN) 1 g in sodium chloride 0.9 % 100 mL IVPB        1 g 200 mL/hr over 30 Minutes Intravenous  Once 09/23/21 1616 09/23/21 1729        Time Spent in minutes  30   Lala Lund M.D on 09/24/2021 at 12:25 PM  To page go to [email or web address]   [facility] -  Office  [phone]  See all Orders from today for further details    Objective:   Vitals:   09/24/21 1035 09/24/21 1042 09/24/21 1045 09/24/21 1217  BP: (!) 102/49 (!) 109/55 (!) 110/58 120/61  Pulse: 84 82 84   Resp: 18 19 20 18   Temp: 98 F (36.7 C)   (!) 97.4 F (36.3 C)  TempSrc:    Oral  SpO2: (!) 88% 100% 99%   Weight:      Height:        Wt Readings from Last 3 Encounters:  09/24/21  57.2 kg  07/14/21 58 kg  07/07/21 58.5 kg     Intake/Output Summary (Last 24 hours) at 09/24/2021 1225 Last data filed at 09/24/2021 1030 Gross per 24 hour  Intake 2332.28 ml  Output 1 ml  Net 2331.28 ml     Physical Exam  Awake Alert, No new F.N deficits, Normal affect [place].AT,PERRAL Supple Neck, No JVD,   Symmetrical Chest wall movement, Good air movement bilaterally, CTAB RRR,No Gallops,Rubs or new Murmurs,  +ve B.Sounds, Abd Soft, No tenderness,   No Cyanosis, Clubbing or edema     Data Review:    CBC Recent Labs  Lab 09/23/21 1620 09/23/21 1704 09/24/21 0129 09/24/21 0550  WBC 4.1  --   --  3.0*  HGB 13.6 13.6   13.9 10.9* 10.5*  HCT 40.2 40.0   41.0 32.5* 31.5*  PLT 188  --   --  136*  MCV 110.7*  --   --  112.5*  MCH 37.5*  --   --  37.5*  MCHC 33.8  --   --  33.3  RDW 14.1  --   --  14.4  LYMPHSABS 0.6*  --   --  0.5*  MONOABS 0.6  --   --  0.4  EOSABS 0.3  --   --  0.2  BASOSABS 0.1  --   --  0.0    Electrolytes Recent Labs  Lab 09/23/21 1604 09/23/21 1620 09/23/21 1704 09/24/21 0550 09/24/21 0636  NA  --  137 138   138 140  --   K  --  4.1 5.3*   5.3* 4.1  --   CL  --  102 99 107  --   CO2  --  26  --  22  --   GLUCOSE  --  141* 137* 118*  --   BUN  --  9 13 13   --   CREATININE  --  0.76 0.60 0.85  --   CALCIUM  --  9.5  --  8.1*  --   AST  --  68*  --  54*  --   ALT  --  31  --  25  --   ALKPHOS  --  110  --  89  --   BILITOT  --  3.5*  --  4.2*  --   ALBUMIN  --  2.8*  --  2.4*  --   MG  --   --   --  1.3*  --   LATICACIDVEN  --  3.1*  --   --   --   INR  --  1.5*  --   --   --   HGBA1C 4.3*  --   --   --   --   AMMONIA  --   --   --   --  56*  BNP  --   --   --  625.1*  --     ------------------------------------------------------------------------------------------------------------------ No results for input(s): CHOL, HDL, LDLCALC, TRIG, CHOLHDL, LDLDIRECT in the last 72 hours.  Lab Results  Component Value Date   HGBA1C 4.3  (L) 09/23/2021    No results for input(s): TSH, T4TOTAL, T3FREE, THYROIDAB in the last 72 hours.  Invalid input(s): FREET3 ------------------------------------------------------------------------------------------------------------------ ID Labs Recent Labs  Lab 09/23/21 1620 09/23/21 1704 09/24/21 0550  WBC 4.1  --  3.0*  PLT 188  --  136*  LATICACIDVEN 3.1*  --   --   CREATININE 0.76 0.60 0.85   Cardiac Enzymes No results for input(s): CKMB, TROPONINI, MYOGLOBIN in the last 168 hours.  Invalid input(s): CK  Radiology Reports DG Chest Portable 1 View  Result Date: 09/23/2021 CLINICAL DATA:  Shortness of breath and vomiting blood. EXAM: PORTABLE CHEST 1 VIEW COMPARISON:  Multiple prior CT chest examinations as well as chest radiograph dated Dec 31, 2019 FINDINGS: Status post right lobectomy with fibrosis in the right hilar region and mediastinal shift to the right is unchanged. Left lung is clear. Mild levoscoliosis of the thoracic spine. No acute osseous abnormality. IMPRESSION: Postsurgical changes with volume loss in the right lung and fibrosis in the right hilar region with mediastinal shift to the right are unchanged. Left lung is clear. No definite evidence of acute cardiopulmonary process. Electronically Signed   By: Keane Police D.O.   On: 09/23/2021 17:13

## 2021-09-24 NOTE — ED Notes (Signed)
Pt report itching more than normal. No rash or respiratory issues. Chen DO notified. Benadryl ordered.

## 2021-09-24 NOTE — Significant Event (Signed)
Patient has developed diffuse rash after admission.  Patient states he did have some rash in the lower extremities few days ago which she was attributing to poison ivy.  But the rash on the upper extremities and chest and trunk developed after admission.  Patient did receive 1 dose of ceftriaxone and also was on octreotide and Protonix.  Discussed with pharmacy and noted that patient has not received ceftriaxone previously.  And also discussed with admitting physician Dr. Kristopher Oppenheim.  On exam at bedside patient has small macular rash all over her body but no rash in the oral mucosa no ulceration.  The cause for the rash not clear but after discussing with pharmacy plan is to discontinue ceftriaxone and keep patient on Cipro we already gave patient 2 doses of Benadryl we will also keep her on Pepcid.  Continue to closely observe.  Gean Birchwood

## 2021-09-24 NOTE — Plan of Care (Signed)
  Problem: Clinical Measurements: Goal: Respiratory complications will improve Outcome: Progressing Goal: Cardiovascular complication will be avoided Outcome: Progressing   Problem: Activity: Goal: Risk for activity intolerance will decrease Outcome: Progressing   Problem: Nutrition: Goal: Adequate nutrition will be maintained Outcome: Progressing   Problem: Elimination: Goal: Will not experience complications related to bowel motility Outcome: Progressing Goal: Will not experience complications related to urinary retention Outcome: Progressing   Problem: Pain Managment: Goal: General experience of comfort will improve Outcome: Progressing   Problem: Safety: Goal: Ability to remain free from injury will improve Outcome: Progressing   

## 2021-09-24 NOTE — Transfer of Care (Signed)
Immediate Anesthesia Transfer of Care Note  [name]  Procedure(s) Performed: ESOPHAGOGASTRODUODENOSCOPY (EGD) (Left) HEMOSTASIS CONTROL  Patient Location: PACU  Anesthesia Type:General  Level of Consciousness: sedated  Airway & Oxygen Therapy: Patient Spontanous Breathing and Patient connected to nasal cannula oxygen  Post-op Assessment: Report given to RN, Post -op Vital signs reviewed and stable and Patient moving all extremities  Post vital signs: Reviewed and stable  Last Vitals:  Vitals Value Taken Time  BP 109/55 09/24/21 1042  Temp    Pulse 84 09/24/21 1043  Resp 18 09/24/21 1043  SpO2 100 % 09/24/21 1043  Vitals shown include unvalidated device data.  Last Pain:  Vitals:   09/24/21 0928  TempSrc: Oral  PainSc: 0-No pain      Patients Stated Pain Goal: 0 (14/78/29 5621)  Complications: No notable events documented.

## 2021-09-24 NOTE — Interval H&P Note (Signed)
History and Physical Interval Note:  09/24/2021 9:57 AM  Sandy [name]  has presented today for surgery, with the diagnosis of Hematemesis, esophageal varcies.  The various methods of treatment have been discussed with the patient and family. After consideration of risks, benefits and other options for treatment, the patient has consented to  Procedure(s): ESOPHAGOGASTRODUODENOSCOPY (EGD) (Left) as a surgical intervention.  The patient's history has been reviewed, patient examined, no change in status, stable for surgery.  I have reviewed the patient's chart and labs.  Questions were answered to the patient's satisfaction.     Sharyn Creamer

## 2021-09-24 NOTE — Anesthesia Procedure Notes (Signed)
Procedure Name: Intubation Date/Time: 09/24/2021 10:05 AM Performed by: Leonor Liv, CRNA Pre-anesthesia Checklist: Patient identified, Emergency Drugs available, Suction available and Patient being monitored Patient Re-evaluated:Patient Re-evaluated prior to induction Oxygen Delivery Method: Circle System Utilized Preoxygenation: Pre-oxygenation with 100% oxygen Induction Type: IV induction, Rapid sequence and Cricoid Pressure applied Grade View: Grade I Tube type: Oral Tube size: 7.0 mm Number of attempts: 1 Airway Equipment and Method: Stylet Placement Confirmation: ETT inserted through vocal cords under direct vision, positive ETCO2 and breath sounds checked- equal and bilateral Tube secured with: Tape Dental Injury: Teeth and Oropharynx as per pre-operative assessment

## 2021-09-24 NOTE — Progress Notes (Signed)
Lennette Bihari RN, states patient is ok to transport without telemetry

## 2021-09-24 NOTE — Progress Notes (Signed)
Pt arrived from ED to unit scratching all over body. Rash noted on bilateral arms, legs, and torso. Pt stated that rash to legs present after poison oak exposure 2 weeks ago. Informed by pt itching only to arms and torso at this time. New order received to give pt Benadryl 25 mg IVP.

## 2021-09-24 NOTE — ED Notes (Signed)
Report attempted x 1

## 2021-09-24 NOTE — Progress Notes (Signed)
PT Cancellation Note  Patient Details Name: Sandy [name] MRN: [id number] DOB: February 11, 1956   Cancelled Treatment:    Reason Eval/Treat Not Completed: PT screened, no needs identified, will sign off (pt reports independence with old knee injury but no acute therapy needs. PT very clearly stating no acute needs for OT/PT and declines eval at this time)   [place] Silveria B Sherene Plancarte 09/24/2021, 7:32 AM Bayard Males, PT Acute Rehabilitation Services Pager: [phone] Office: [phone]

## 2021-09-24 NOTE — Anesthesia Postprocedure Evaluation (Signed)
Anesthesia Post Note  [name]  Procedure(s) Performed: ESOPHAGOGASTRODUODENOSCOPY (EGD) (Left) HEMOSTASIS CONTROL     Patient location during evaluation: PACU Anesthesia Type: General Level of consciousness: sedated Pain management: pain level controlled Vital Signs Assessment: post-procedure vital signs reviewed and stable Respiratory status: spontaneous breathing and respiratory function stable Cardiovascular status: stable Postop Assessment: no apparent nausea or vomiting Anesthetic complications: no   No notable events documented.  Last Vitals:  Vitals:   09/24/21 1042 09/24/21 1045  BP: (!) 109/55 (!) 110/58  Pulse: 82 84  Resp: 19 20  Temp:    SpO2: 100% 99%    Last Pain:  Vitals:   09/24/21 1035  TempSrc:   PainSc: 0-No pain                 Michaiah Holsopple DANIEL

## 2021-09-24 NOTE — Progress Notes (Signed)
°  Transition of Care [facility]) Screening Note   Patient Details  Name: Sandy [name] Date of Birth: May 16, 1956   Transition of Care [facility]) CM/SW Contact:    Milas Gain, [place] Phone Number: 09/24/2021, 2:11 PM    Transition of Care Department [facility]) has reviewed patient and no TOC needs have been identified at this time. We will continue to monitor patient advancement through interdisciplinary progression rounds. If new patient transition needs arise, please place a TOC consult.

## 2021-09-24 NOTE — Progress Notes (Signed)
OT Cancellation Note  Patient Details Name: JILLYAN PLITT MRN: [id number] DOB: 1955-11-28   Cancelled Treatment:    Reason Eval/Treat Not Completed: OT screened, no needs identified. Per PT, patient reports being at baseline level of function. Declining therapy evaluations. OT to sign off at this time.   Gloris Manchester OTR/L Supplemental OT, Department of rehab services [phone]  [place] Dani R H. 09/24/2021, 11:54 AM

## 2021-09-25 DIAGNOSIS — I8501 Esophageal varices with bleeding: Secondary | ICD-10-CM

## 2021-09-25 LAB — CBC WITH DIFFERENTIAL/PLATELET
Abs Immature Granulocytes: 0.01 10*3/uL (ref 0.00–0.07)
Basophils Absolute: 0 10*3/uL (ref 0.0–0.1)
Basophils Relative: 0 %
Eosinophils Absolute: 0 10*3/uL (ref 0.0–0.5)
Eosinophils Relative: 0 %
HCT: 28.7 % — ABNORMAL LOW (ref 36.0–46.0)
Hemoglobin: 9.3 g/dL — ABNORMAL LOW (ref 12.0–15.0)
Immature Granulocytes: 1 %
Lymphocytes Relative: 10 %
Lymphs Abs: 0.2 10*3/uL — ABNORMAL LOW (ref 0.7–4.0)
MCH: 36.6 pg — ABNORMAL HIGH (ref 26.0–34.0)
MCHC: 32.4 g/dL (ref 30.0–36.0)
MCV: 113 fL — ABNORMAL HIGH (ref 80.0–100.0)
Monocytes Absolute: 0.1 10*3/uL (ref 0.1–1.0)
Monocytes Relative: 5 %
Neutro Abs: 1.7 10*3/uL (ref 1.7–7.7)
Neutrophils Relative %: 84 %
Platelets: 104 10*3/uL — ABNORMAL LOW (ref 150–400)
RBC: 2.54 MIL/uL — ABNORMAL LOW (ref 3.87–5.11)
RDW: 14.1 % (ref 11.5–15.5)
WBC: 2 10*3/uL — ABNORMAL LOW (ref 4.0–10.5)
nRBC: 0 % (ref 0.0–0.2)

## 2021-09-25 LAB — COMPREHENSIVE METABOLIC PANEL
ALT: 23 U/L (ref 0–44)
AST: 44 U/L — ABNORMAL HIGH (ref 15–41)
Albumin: 2.4 g/dL — ABNORMAL LOW (ref 3.5–5.0)
Alkaline Phosphatase: 80 U/L (ref 38–126)
Anion gap: 10 (ref 5–15)
BUN: 12 mg/dL (ref 8–23)
CO2: 20 mmol/L — ABNORMAL LOW (ref 22–32)
Calcium: 7.7 mg/dL — ABNORMAL LOW (ref 8.9–10.3)
Chloride: 104 mmol/L (ref 98–111)
Creatinine, Ser: 0.78 mg/dL (ref 0.44–1.00)
GFR, Estimated: >60 mL/min (ref >60)
Glucose, Bld: 123 mg/dL — ABNORMAL HIGH (ref 70–99)
Potassium: 3.7 mmol/L (ref 3.5–5.1)
Sodium: 134 mmol/L — ABNORMAL LOW (ref 135–145)
Total Bilirubin: 3.4 mg/dL — ABNORMAL HIGH (ref 0.3–1.2)
Total Protein: 6.3 g/dL — ABNORMAL LOW (ref 6.5–8.1)

## 2021-09-25 LAB — T4, FREE: Free T4: 0.92 ng/dL (ref 0.61–1.12)

## 2021-09-25 LAB — GLUCOSE, CAPILLARY
Glucose-Capillary: 132 mg/dL — ABNORMAL HIGH (ref 70–99)
Glucose-Capillary: 189 mg/dL — ABNORMAL HIGH (ref 70–99)
Glucose-Capillary: 170 mg/dL — ABNORMAL HIGH (ref 70–99)

## 2021-09-25 LAB — BRAIN NATRIURETIC PEPTIDE: B Natriuretic Peptide: 1005.6 pg/mL — ABNORMAL HIGH (ref 0.0–100.0)

## 2021-09-25 LAB — MAGNESIUM: Magnesium: 1.2 mg/dL — ABNORMAL LOW (ref 1.7–2.4)

## 2021-09-25 LAB — TSH: TSH: 0.192 u[IU]/mL — ABNORMAL LOW (ref 0.350–4.500)

## 2021-09-25 MED ORDER — MAGNESIUM SULFATE 4 GM/100ML IV SOLN
4.0000 g | Freq: Once | INTRAVENOUS | Status: AC
  Administered 2021-09-25: 4 g via INTRAVENOUS
  Filled 2021-09-25: qty 100

## 2021-09-25 NOTE — Plan of Care (Signed)
  Problem: Health Behavior/Discharge Planning: Goal: Ability to manage health-related needs will improve Outcome: Progressing   Problem: Clinical Measurements: Goal: Ability to maintain clinical measurements within normal limits will improve Outcome: Progressing   Problem: Activity: Goal: Risk for activity intolerance will decrease Outcome: Progressing   

## 2021-09-25 NOTE — Progress Notes (Signed)
PROGRESS NOTE                                                                                                                                                                                                             Patient Demographics:    Sandy [name], is a 66 y.o. female, DOB - 08-15-1956, XNT:[id number]  Outpatient Primary MD for the patient is [name], MD    LOS - 2  Admit date - 09/23/2021    Chief Complaint  Patient presents with   Emesis       Brief Narrative (HPI from H&P)  - 66 year old female with a history of alcoholic cirrhosis, type 2 diabetes, history of lung cancer status post lobectomy, history of esophageal varices presents to the ER today with 1 day history of vomiting blood.  In the ER she was diagnosed with upper GI bleed, she also had a brief run of SVT which resolved after she got a dose of adenosine.  She was admitted for work-up of GI bleed   Subjective:   Patient in bed, appears comfortable, denies any headache, no fever, no chest pain or pressure, no shortness of breath , no abdominal pain. No new focal weakness.  Improved itching.    Assessment  & Plan :    Upper GI bleed with hematemesis causing acute blood loss related anemia.  In a patient with known history of alcoholic cirrhosis and esophageal varices who still drinking alcohol.  H&H for now although has dropped but stable, continue to monitor transfuse if drops below 8, has been seen by GI underwent EGD on 09/24/2021 showing LA Grade D esophagitis with no bleeding,  Portal hypertensive gastropathy, few gastric polyps& a single non-bleeding angioectasia in the duodenum.  She has been treated with argon plasma coagulation, continue PPI for 8 weeks and antibiotic SBP prophylaxis for a week.  Thereafter follow-up with her primary GI within of 1 month.  For repeat EGD to document healing.  Home dose beta-blocker for esophageal varices  resumed.  2.  Ongoing alcohol abuse with alcoholic cirrhosis.  Says she drinks now 1-2 drinks a week.  Counseled to quit.  Monitor for any withdrawal.  3.  Past history of lung cancer.  Supportive care.  4.  Dyslipidemia.  On statin.  5.  Hypothyroidism.  On Synthroid.  TSH was  noted to be suppressed, will check free T4.  6.  Allergic reaction to Rocephin with itching.  Supportive care, Benadryl, steroids and steroid cream, better, she does have some chronic itching as well.  7.  Essential hypertension.  For now beta-blocker only.    8.  Run of SVT in the ER.  Resolved after adenosine, check TSH and monitor on beta-blocker   9.  Severe hypomagnesemia.  Replaced IV will monitor.    10. DM type II.  Sliding scale.  CBG (last 3)  Recent Labs    09/24/21 1803 09/24/21 2351 09/25/21 0630  GLUCAP 168* 131* 132*    Lab Results  Component Value Date   HGBA1C 4.3 (L) 09/23/2021        Condition - Extremely Guarded  Family Communication  :  none present  Code Status :  Full  Consults  :  GI  PUD Prophylaxis : PPI   Procedures  :     EGD -   Impression:               - LA Grade D esophagitis with no bleeding.                           - Portal hypertensive gastropathy.                           - A few gastric polyps.                           - A single non-bleeding angioectasia in the                            duodenum. Treated with argon plasma coagulation (APC).                           - No specimens collected.  Recommendation:            - Return patient to hospital ward for ongoing care.                           - Use a proton pump inhibitor PO BID for 8 weeks.                           - Complete 7 days of antibiotic therapy for SBP PPx                            in the setting of GI bleed                           - Okay to stop octreotide.                           - Repeat upper endoscopy in 8 weeks to check                            healing. Can  follow up with her hepatologist at  [facility] to get this done.                           - Avoid alcohol intake      Disposition Plan  :    Status is: Inpatient - UGI Bleed   DVT Prophylaxis  :    Place and maintain sequential compression device Start: 09/24/21 0536 SCDs Start: 09/23/21 2027     Lab Results  Component Value Date   PLT 104 (L) 09/25/2021    Diet :  Diet Order             DIET SOFT Room service appropriate? Yes; Fluid consistency: Thin  Diet effective now                    Inpatient Medications  Scheduled Meds:  atorvastatin  10 mg Oral QHS   feeding supplement  237 mL Oral TID BM   insulin aspart  0-9 Units Subcutaneous Q6H   iron polysaccharides   Oral Daily   nadolol  20 mg Oral QPM   Continuous Infusions:  ciprofloxacin 400 mg (09/25/21 0617)   pantoprazole 8 mg/hr (09/25/21 0940)   PRN Meds:.diphenhydrAMINE, hydrocortisone cream, ondansetron **OR** ondansetron (ZOFRAN) IV  Antibiotics  :    Anti-infectives (From admission, onward)    Start     Dose/Rate Route Frequency Ordered Stop   09/24/21 0900  cefTRIAXone (ROCEPHIN) 1 g in sodium chloride 0.9 % 100 mL IVPB  Status:  Discontinued        1 g 200 mL/hr over 30 Minutes Intravenous Every 24 hours 09/23/21 2026 09/24/21 0510   09/24/21 0600  ciprofloxacin (CIPRO) IVPB 400 mg        400 mg 200 mL/hr over 60 Minutes Intravenous Every 12 hours 09/24/21 0512     09/23/21 1630  cefTRIAXone (ROCEPHIN) 1 g in sodium chloride 0.9 % 100 mL IVPB        1 g 200 mL/hr over 30 Minutes Intravenous  Once 09/23/21 1616 09/23/21 1729        Time Spent in minutes  30   Lala Lund M.D on 09/25/2021 at 10:27 AM  To page go to [email or web address]   [facility] -  Office  [phone]  See all Orders from today for further details    Objective:   Vitals:   09/24/21 1829 09/24/21 2158 09/24/21 2357 09/25/21 0459  BP: 126/68 124/78 115/67 108/65  Pulse: 88 76  79 81  Resp: 18 20 20 19   Temp:  97.7 F (36.5 C) 97.6 F (36.4 C) 97.7 F (36.5 C)  TempSrc:  Oral Oral Oral  SpO2: 98% 96% 97% 93%  Weight:    59.7 kg  Height:        Wt Readings from Last 3 Encounters:  09/25/21 59.7 kg  07/14/21 58 kg  07/07/21 58.5 kg     Intake/Output Summary (Last 24 hours) at 09/25/2021 1027 Last data filed at 09/24/2021 1030 Gross per 24 hour  Intake 200 ml  Output 1 ml  Net 199 ml     Physical Exam  Awake Alert, No new F.N deficits, Normal affect [place].AT,PERRAL Supple Neck, No JVD,   Symmetrical Chest wall movement, Good air movement bilaterally, CTAB RRR,No Gallops, Rubs or new Murmurs,  +ve B.Sounds, Abd Soft, No tenderness,   No Cyanosis, Clubbing or edema    Data Review:    CBC Recent Labs  Lab 09/23/21 1620 09/23/21  1704 09/24/21 0129 09/24/21 0550 09/24/21 1354 09/24/21 2133 09/25/21 0235  WBC 4.1  --   --  3.0* 1.7* 1.2* 2.0*  HGB 13.6   < > 10.9* 10.5* 9.8* 9.6* 9.3*  HCT 40.2   < > 32.5* 31.5* 31.0* 28.3* 28.7*  PLT 188  --   --  136* 118* 91* 104*  MCV 110.7*  --   --  112.5* 115.2* 113.7* 113.0*  MCH 37.5*  --   --  37.5* 36.4* 38.6* 36.6*  MCHC 33.8  --   --  33.3 31.6 33.9 32.4  RDW 14.1  --   --  14.4 14.6 14.2 14.1  LYMPHSABS 0.6*  --   --  0.5*  --   --  0.2*  MONOABS 0.6  --   --  0.4  --   --  0.1  EOSABS 0.3  --   --  0.2  --   --  0.0  BASOSABS 0.1  --   --  0.0  --   --  0.0   < > = values in this interval not displayed.    Electrolytes Recent Labs  Lab 09/23/21 1604 09/23/21 1620 09/23/21 1704 09/24/21 0550 09/24/21 0636 09/25/21 0235  NA  --  137 138   138 140  --  134*  K  --  4.1 5.3*   5.3* 4.1  --  3.7  CL  --  102 99 107  --  104  CO2  --  26  --  22  --  20*  GLUCOSE  --  141* 137* 118*  --  123*  BUN  --  9 13 13   --  12  CREATININE  --  0.76 0.60 0.85  --  0.78  CALCIUM  --  9.5  --  8.1*  --  7.7*  AST  --  68*  --  54*  --  44*  ALT  --  31  --  25  --  23  ALKPHOS  --  110  --   89  --  80  BILITOT  --  3.5*  --  4.2*  --  3.4*  ALBUMIN  --  2.8*  --  2.4*  --  2.4*  MG  --   --   --  1.3*  --  1.2*  LATICACIDVEN  --  3.1*  --   --   --   --   INR  --  1.5*  --   --   --   --   TSH  --   --   --   --   --  0.192*  HGBA1C 4.3*  --   --   --   --   --   AMMONIA  --   --   --   --  56*  --   BNP  --   --   --  625.1*  --  1,005.6*    ------------------------------------------------------------------------------------------------------------------ No results for input(s): CHOL, HDL, LDLCALC, TRIG, CHOLHDL, LDLDIRECT in the last 72 hours.  Lab Results  Component Value Date   HGBA1C 4.3 (L) 09/23/2021    Recent Labs    09/25/21 0235  TSH 0.192*   ------------------------------------------------------------------------------------------------------------------ ID Labs Recent Labs  Lab 09/23/21 1620 09/23/21 1704 09/24/21 0550 09/24/21 1354 09/24/21 2133 09/25/21 0235  WBC 4.1  --  3.0* 1.7* 1.2* 2.0*  PLT 188  --  136* 118* 91* 104*  LATICACIDVEN 3.1*  --   --   --   --   --  CREATININE 0.76 0.60 0.85  --   --  0.78   Cardiac Enzymes No results for input(s): CKMB, TROPONINI, MYOGLOBIN in the last 168 hours.  Invalid input(s): CK  Radiology Reports DG Chest Portable 1 View  Result Date: 09/23/2021 CLINICAL DATA:  Shortness of breath and vomiting blood. EXAM: PORTABLE CHEST 1 VIEW COMPARISON:  Multiple prior CT chest examinations as well as chest radiograph dated Dec 31, 2019 FINDINGS: Status post right lobectomy with fibrosis in the right hilar region and mediastinal shift to the right is unchanged. Left lung is clear. Mild levoscoliosis of the thoracic spine. No acute osseous abnormality. IMPRESSION: Postsurgical changes with volume loss in the right lung and fibrosis in the right hilar region with mediastinal shift to the right are unchanged. Left lung is clear. No definite evidence of acute cardiopulmonary process. Electronically Signed   By: Keane Police D.O.   On: 09/23/2021 17:13

## 2021-09-25 NOTE — Progress Notes (Signed)
Gastroenterology Inpatient Follow Up    Subjective: Has not had any additional episodes of vomiting. Denies any signs of bleeding. Denies ab pain. Her legs and abdomen do not feel swollen.   Objective: Vital signs in last 24 hours: Temp:  [97.6 F (36.4 C)-97.7 F (36.5 C)] 97.7 F (36.5 C) (02/04 0459) Pulse Rate:  [76-93] 81 (02/04 0459) Resp:  [18-20] 19 (02/04 0459) BP: (108-140)/(65-80) 108/65 (02/04 0459) SpO2:  [93 %-98 %] 93 % (02/04 0459) Weight:  [59.7 kg] 59.7 kg (02/04 0459) Last BM Date: 09/24/21  Intake/Output from previous day: 02/03 0701 - 02/04 0700 In: 600 [I.V.:600] Out: 251 [Urine:250; Blood:1] Intake/Output this shift: No intake/output data recorded.  General appearance: alert, cooperative Resp: no increased WOB Cardio: regular rate GI: soft, non-tender; bowel sounds normal; no masses,  no organomegaly Extremities: extremities normal, atraumatic, no cyanosis or edema  Lab Results: Recent Labs    09/24/21 1354 09/24/21 2133 09/25/21 0235  WBC 1.7* 1.2* 2.0*  HGB 9.8* 9.6* 9.3*  HCT 31.0* 28.3* 28.7*  PLT 118* 91* 104*   BMET Recent Labs    09/23/21 1620 09/23/21 1704 09/24/21 0550 09/25/21 0235  NA 137 138   138 140 134*  K 4.1 5.3*   5.3* 4.1 3.7  CL 102 99 107 104  CO2 26  --  22 20*  GLUCOSE 141* 137* 118* 123*  BUN 9 13 13 12   CREATININE 0.76 0.60 0.85 0.78  CALCIUM 9.5  --  8.1* 7.7*   LFT Recent Labs    09/25/21 0235  PROT 6.3*  ALBUMIN 2.4*  AST 44*  ALT 23  ALKPHOS 80  BILITOT 3.4*   PT/INR Recent Labs    09/23/21 1620  LABPROT 18.3*  INR 1.5*   Hepatitis Panel No results for input(s): HEPBSAG, HCVAB, HEPAIGM, HEPBIGM in the last 72 hours. C-Diff No results for input(s): CDIFFTOX in the last 72 hours.  Studies/Results: DG Chest Portable 1 View  Result Date: 09/23/2021 CLINICAL DATA:  Shortness of breath and vomiting blood. EXAM: PORTABLE CHEST 1 VIEW COMPARISON:  Multiple prior CT chest examinations  as well as chest radiograph dated Dec 31, 2019 FINDINGS: Status post right lobectomy with fibrosis in the right hilar region and mediastinal shift to the right is unchanged. Left lung is clear. Mild levoscoliosis of the thoracic spine. No acute osseous abnormality. IMPRESSION: Postsurgical changes with volume loss in the right lung and fibrosis in the right hilar region with mediastinal shift to the right are unchanged. Left lung is clear. No definite evidence of acute cardiopulmonary process. Electronically Signed   By: Keane Police D.O.   On: 09/23/2021 17:13    Medications: I have reviewed the patient's current medications. Scheduled:  atorvastatin  10 mg Oral QHS   feeding supplement  237 mL Oral TID BM   insulin aspart  0-9 Units Subcutaneous Q6H   iron polysaccharides   Oral Daily   nadolol  20 mg Oral QPM   Continuous:  ciprofloxacin 400 mg (09/25/21 0617)   pantoprazole 8 mg/hr (09/25/21 0940)   CHY:IFOYDXAJOINOMVE, hydrocortisone cream, ondansetron **OR** ondansetron (ZOFRAN) IV  EGD 09/24/21: - LA Grade D esophagitis with no bleeding. - Portal hypertensive gastropathy. - A few gastric polyps. - A single non-bleeding angioectasia in the duodenum. Treated with argon plasma coagulation (APC).  Assessment/Plan: 66 y.o. female with a history of EtOH cirrhosis, stage IIIa non-small cell lung cancer s/p right lower lobectomy 12/2018 followed by postoperative adjuvant chemoradiation, diabetes,  HTN, HLD, radiation-induced hypothyroidism, arthritis, IDA, chronic cough, presenting to the ER with coffee-ground emesis.  EGD showed grade D esophagitis, PHG, and a duodenal angioectasia s/p APC. No signs of bleeding after the procedure. Labs look good. She was restarted on her home beta blocker. No other signs of decompensation at this time.  - Use a proton pump inhibitor PO BID for 8 weeks. - Complete 7 days of antibiotic therapy for SBP PPx in the setting of GI bleed - Repeat upper endoscopy in  8 weeks to check healing. Can follow up with her hepatologist at [facility] to get this done. - Avoid alcohol intake if possible - GI will sign off   LOS: 2 days   Sharyn Creamer 09/25/2021, 1:37 PM

## 2021-09-26 DIAGNOSIS — K7030 Alcoholic cirrhosis of liver without ascites: Secondary | ICD-10-CM | POA: Diagnosis not present

## 2021-09-26 LAB — CBC WITH DIFFERENTIAL/PLATELET
Abs Immature Granulocytes: 0.05 10*3/uL (ref 0.00–0.07)
Basophils Absolute: 0 10*3/uL (ref 0.0–0.1)
Basophils Relative: 1 %
Eosinophils Absolute: 0.1 10*3/uL (ref 0.0–0.5)
Eosinophils Relative: 1 %
HCT: 32.9 % — ABNORMAL LOW (ref 36.0–46.0)
Hemoglobin: 10.5 g/dL — ABNORMAL LOW (ref 12.0–15.0)
Immature Granulocytes: 1 %
Lymphocytes Relative: 10 %
Lymphs Abs: 0.6 10*3/uL — ABNORMAL LOW (ref 0.7–4.0)
MCH: 36.8 pg — ABNORMAL HIGH (ref 26.0–34.0)
MCHC: 31.9 g/dL (ref 30.0–36.0)
MCV: 115.4 fL — ABNORMAL HIGH (ref 80.0–100.0)
Monocytes Absolute: 0.7 10*3/uL (ref 0.1–1.0)
Monocytes Relative: 11 %
Neutro Abs: 4.7 10*3/uL (ref 1.7–7.7)
Neutrophils Relative %: 76 %
Platelets: 165 10*3/uL (ref 150–400)
RBC: 2.85 MIL/uL — ABNORMAL LOW (ref 3.87–5.11)
RDW: 14.7 % (ref 11.5–15.5)
WBC: 6.1 10*3/uL (ref 4.0–10.5)
nRBC: 0 % (ref 0.0–0.2)

## 2021-09-26 LAB — COMPREHENSIVE METABOLIC PANEL
ALT: 31 U/L (ref 0–44)
AST: 75 U/L — ABNORMAL HIGH (ref 15–41)
Albumin: 2.5 g/dL — ABNORMAL LOW (ref 3.5–5.0)
Alkaline Phosphatase: 98 U/L (ref 38–126)
Anion gap: 7 (ref 5–15)
BUN: 13 mg/dL (ref 8–23)
CO2: 24 mmol/L (ref 22–32)
Calcium: 8.3 mg/dL — ABNORMAL LOW (ref 8.9–10.3)
Chloride: 105 mmol/L (ref 98–111)
Creatinine, Ser: 0.91 mg/dL (ref 0.44–1.00)
GFR, Estimated: >60 mL/min (ref >60)
Glucose, Bld: 108 mg/dL — ABNORMAL HIGH (ref 70–99)
Potassium: 4 mmol/L (ref 3.5–5.1)
Sodium: 136 mmol/L (ref 135–145)
Total Bilirubin: 2.9 mg/dL — ABNORMAL HIGH (ref 0.3–1.2)
Total Protein: 6.4 g/dL — ABNORMAL LOW (ref 6.5–8.1)

## 2021-09-26 LAB — GLUCOSE, CAPILLARY
Glucose-Capillary: 179 mg/dL — ABNORMAL HIGH (ref 70–99)
Glucose-Capillary: 94 mg/dL (ref 70–99)

## 2021-09-26 LAB — BRAIN NATRIURETIC PEPTIDE: B Natriuretic Peptide: 1008.3 pg/mL — ABNORMAL HIGH (ref 0.0–100.0)

## 2021-09-26 LAB — MAGNESIUM: Magnesium: 1.9 mg/dL (ref 1.7–2.4)

## 2021-09-26 MED ORDER — OMEPRAZOLE 40 MG PO CPDR: 40.0000 mg | DELAYED_RELEASE_CAPSULE | Freq: Two times a day (BID) | ORAL | 2 refills | Status: DC

## 2021-09-26 MED ORDER — CIPROFLOXACIN HCL 500 MG PO TABS: 500.0000 mg | ORAL_TABLET | Freq: Two times a day (BID) | ORAL | 0 refills | Status: AC

## 2021-09-26 NOTE — Discharge Instructions (Addendum)
Follow with Primary MD [name], MD in 1 week and your gastroenterologist within a week for repeat EGD to be done within a month    Get CBC, CMP, Magnesium, TSH  -  checked next visit within 1 week by Primary MD   Activity: As tolerated with Full fall precautions use walker/cane & assistance as needed  Disposition Home   Diet: Soft diet for the next 3 days then advance to regular consistency diet as tolerated  Special Instructions: If you have smoked or chewed Tobacco  in the last 2 yrs please stop smoking, stop any regular Alcohol  and or any Recreational drug use.  On your next visit with your primary care physician please Get Medicines reviewed and adjusted.  Please request your Prim.MD to go over all Hospital Tests and Procedure/Radiological results at the follow up, please get all Hospital records sent to your Prim MD by signing hospital release before you go home.  If you experience worsening of your admission symptoms, develop shortness of breath, life threatening emergency, suicidal or homicidal thoughts you must seek medical attention immediately by calling 911 or calling your MD immediately  if symptoms less severe.  You Must read complete instructions/literature along with all the possible adverse reactions/side effects for all the Medicines you take and that have been prescribed to you. Take any new Medicines after you have completely understood and accpet all the possible adverse reactions/side effects.

## 2021-09-26 NOTE — Discharge Summary (Signed)
Sandy [name]:[id number] DOB: [birth year] DOA: 09/23/2021  PCP: [name], MD  Admit date: 09/23/2021  Discharge date: 09/26/2021  Admitted From: Home   Disposition:  Home   Recommendations for Outpatient Follow-up:   Follow up with PCP in 1-2 weeks  PCP Please obtain BMP/CBC, 2 view CXR in 1week,  (see Discharge instructions)   PCP Please follow up on the following pending results: Must follow with her GI MD in 1 week, monitor CBC, CMP, Mag, TSH, free T4 within 1 week   Home Health: None   Equipment/Devices: None  Consultations: GI Discharge Condition: Stable    CODE STATUS: Full    Diet Recommendation: Soft    Chief Complaint  Patient presents with   Emesis     Brief history of present illness from the day of admission and additional interim summary    66 year old female with a history of alcoholic cirrhosis, type 2 diabetes, history of lung cancer status post lobectomy, history of esophageal varices presents to the ER today with 1 day history of vomiting blood.  In the ER she was diagnosed with upper GI bleed, she also had a brief run of SVT which resolved after she got a dose of adenosine.  She was admitted for work-up of GI bleed.                                                                 Hospital Course   Upper GI bleed with hematemesis causing acute blood loss related anemia.  In a patient with known history of alcoholic cirrhosis and esophageal varices who still drinking alcohol.  H&H for now although has dropped but stable, continue to monitor transfuse if drops below 8, has been seen by GI underwent EGD on 09/24/2021 showing LA Grade D esophagitis with no bleeding,  Portal hypertensive gastropathy, few gastric polyps& a single non-bleeding angioectasia in the duodenum.  She has been  treated with argon plasma coagulation, continue PPI for 8 weeks and antibiotic SBP prophylaxis for a week. Thereafter follow-up with her primary GI within 1 month for repeat EGD to document healing.  Home dose beta-blocker for esophageal varices resumed.   2.  Ongoing alcohol abuse with alcoholic cirrhosis.  Says she drinks now 1-2 drinks a week.  Counseled to quit.  Monitor for any withdrawal.   3.  Past history of lung cancer.  Supportive care.   4.  Dyslipidemia.  On statin.   5.  Hypothyroidism.  On Synthroid.  TSH was mildly suppressed but this could be due to sick euthyroid syndrome, free T4 was stable, request PCP to recheck TSH free T4 and T3 and 1 to 2 weeks.    6.  Allergic reaction to Rocephin with itching.  Supportive care, Benadryl, steroids and steroid cream, better, she  does have some chronic itching as well.  Now at baseline.   7.  Essential hypertension.  New home regimen with beta-blocker and ACE inhibitor.   8.  Run of SVT in the ER.  Resolved after adenosine, continue beta-blocker TSH as a #5 above.  PCP to monitor closely.   9.  Severe hypomagnesemia.  Replaced IV will monitor.     10. DM type II.  Continue home regimen upon discharge.   Discharge diagnosis     Principal Problem:   Hematemesis Active Problems:   Type 2 diabetes mellitus (HCC)   Alcoholic cirrhosis (HCC)   History of esophageal varices   History of lung cancer    Discharge instructions    Discharge Instructions     Discharge instructions   Complete by: As directed    Follow with Primary MD [name], MD in 1 week and your gastroenterologist within a week for repeat EGD to be done within a month    Get CBC, CMP, Magnesium, TSH  -  checked next visit within 1 week by Primary MD   Activity: As tolerated with Full fall precautions use walker/cane & assistance as needed  Disposition Home   Diet: Soft diet for the next 3 days then advance to regular consistency diet as  tolerated  Special Instructions: If you have smoked or chewed Tobacco  in the last 2 yrs please stop smoking, stop any regular Alcohol  and or any Recreational drug use.  On your next visit with your primary care physician please Get Medicines reviewed and adjusted.  Please request your Prim.MD to go over all Hospital Tests and Procedure/Radiological results at the follow up, please get all Hospital records sent to your Prim MD by signing hospital release before you go home.  If you experience worsening of your admission symptoms, develop shortness of breath, life threatening emergency, suicidal or homicidal thoughts you must seek medical attention immediately by calling 911 or calling your MD immediately  if symptoms less severe.  You Must read complete instructions/literature along with all the possible adverse reactions/side effects for all the Medicines you take and that have been prescribed to you. Take any new Medicines after you have completely understood and accpet all the possible adverse reactions/side effects.   Increase activity slowly   Complete by: As directed        Discharge Medications   Allergies as of 09/26/2021       Reactions   Codeine Nausea And Vomiting   Nsaids    Avoid due to esophageal varices   Ceftriaxone Rash        Medication List     TAKE these medications    atorvastatin 10 MG tablet Commonly known as: LIPITOR Take 10 mg by mouth at bedtime.   ciprofloxacin 500 MG tablet Commonly known as: CIPRO Take 1 tablet (500 mg total) by mouth 2 (two) times daily for 4 days.   Integra Plus Caps TAKE 1 CAPSULE BY MOUTH DAILY   levothyroxine 25 MCG tablet Commonly known as: SYNTHROID Take 25 mcg by mouth daily before breakfast.   losartan 100 MG tablet Commonly known as: COZAAR Take 1 tablet by mouth daily.   metFORMIN 500 MG 24 hr tablet Commonly known as: GLUCOPHAGE-XR Take 500 mg by mouth at bedtime.   nadolol 20 MG tablet Commonly known  as: CORGARD Take 20 mg by mouth every evening.   omeprazole 40 MG capsule Commonly known as: PRILOSEC Take 1 capsule (40 mg total)  by mouth in the morning and at bedtime. What changed:  how much to take how to take this when to take this additional instructions   spironolactone 25 MG tablet Commonly known as: ALDACTONE TAKE 1 TABLET(25 MG) BY MOUTH IN THE MORNING         Follow-up Information     [name], MD. Schedule an appointment as soon as possible for a visit in 1 week(s).   Specialty: Family Medicine Why: Also follow-up with your gastroenterologist within a week for a repeat EGD that needs to be done in 4 to 6 weeks. Contact information: [address] [place] [address] [phone]                 Major procedures and Radiology Reports - PLEASE review detailed and final reports thoroughly  -        DG Chest Portable 1 View  Result Date: 09/23/2021 CLINICAL DATA:  Shortness of breath and vomiting blood. EXAM: PORTABLE CHEST 1 VIEW COMPARISON:  Multiple prior CT chest examinations as well as chest radiograph dated Dec 31, 2019 FINDINGS: Status post right lobectomy with fibrosis in the right hilar region and mediastinal shift to the right is unchanged. Left lung is clear. Mild levoscoliosis of the thoracic spine. No acute osseous abnormality. IMPRESSION: Postsurgical changes with volume loss in the right lung and fibrosis in the right hilar region with mediastinal shift to the right are unchanged. Left lung is clear. No definite evidence of acute cardiopulmonary process. Electronically Signed   By: Keane Police D.O.   On: 09/23/2021 17:13   US Abdomen Limited RUQ (LIVER/GB)  Result Date: 09/08/2021 CLINICAL DATA:  Alcoholic cirrhosis. EXAM: ULTRASOUND ABDOMEN LIMITED RIGHT UPPER QUADRANT COMPARISON:  06/25/2021, CT abdomen pelvis 03/05/2021 and MR abdomen 03/03/2020. FINDINGS: Gallbladder: No gallstones or wall thickening visualized. No  sonographic Murphy sign noted by sonographer. Common bile duct: Diameter: 3 mm, within normal limits. Liver: Coarsened in echotexture with a nodular contour. Tiny hypoechoic lesion in the left hepatic lobe, too small to characterize. Portal vein is patent on color Doppler imaging with reversal of blood flow towards the liver. Other: An anechoic lesion with increased through transmission in the upper pole right kidney is incidentally imaged, measuring 3.6 x 4.0 cm. IMPRESSION: 1. Cirrhosis with hepatofugal flow in the portal vein. 2. Subcentimeter hypoechoic lesion in the left hepatic lobe, too small to characterize. Repeat MR abdomen without and with contrast, as suggested on 03/03/2020, is recommended as hepatocellular carcinoma cannot be excluded. 3. Right renal cyst. Electronically Signed   By: Lorin Picket M.D.   On: 09/08/2021 10:51      Today   Subjective    Edit Sandy [name] today has no headache,no chest abdominal pain,no new weakness tingling or numbness, feels much better wants to go home today.    Objective   Blood pressure (!) 117/99, pulse 78, temperature (!) 97.4 F (36.3 C), temperature source Oral, resp. rate 18, height 5\' 3"  (1.6 m), weight 60.6 kg, SpO2 98 %.   Intake/Output Summary (Last 24 hours) at 09/26/2021 0842 Last data filed at 09/26/2021 0600 Gross per 24 hour  Intake 1188.8 ml  Output --  Net 1188.8 ml    Exam  Awake Alert, No new F.N deficits,    [place].AT,PERRAL Supple Neck,   Symmetrical Chest wall movement, Good air movement bilaterally, CTAB RRR,No Gallops,   +ve B.Sounds, Abd Soft, Non tender,  No Cyanosis, Clubbing or edema    Data Review  CBC w Diff:  Lab Results  Component Value Date   WBC 6.1 09/26/2021   HGB 10.5 (L) 09/26/2021   HGB 7.0 (L) 06/15/2021   HGB 10.0 (L) 09/23/2020   HCT 32.9 (L) 09/26/2021   HCT 29.4 (L) 09/23/2020   PLT 165 09/26/2021   PLT 193 06/15/2021   LYMPHOPCT 10 09/26/2021   MONOPCT 11 09/26/2021   EOSPCT 1  09/26/2021   BASOPCT 1 09/26/2021    CMP:  Lab Results  Component Value Date   NA 136 09/26/2021   NA 138 10/20/2020   K 4.0 09/26/2021   CL 105 09/26/2021   CO2 24 09/26/2021   BUN 13 09/26/2021   BUN 6 (L) 10/20/2020   CREATININE 0.91 09/26/2021   CREATININE 0.78 06/15/2021   PROT 6.4 (L) 09/26/2021   ALBUMIN 2.5 (L) 09/26/2021   BILITOT 2.9 (H) 09/26/2021   BILITOT 2.5 (H) 06/15/2021   ALKPHOS 98 09/26/2021   AST 75 (H) 09/26/2021   AST 44 (H) 06/15/2021   ALT 31 09/26/2021   ALT 16 06/15/2021  .   Total Time in preparing paper work, data evaluation and todays exam - 89 minutes  Lala Lund M.D on 09/26/2021 at 8:42 AM  [facility]

## 2021-09-27 ENCOUNTER — Encounter (HOSPITAL_COMMUNITY): Payer: Self-pay | Admitting: Internal Medicine

## 2021-10-05 ENCOUNTER — Telehealth: Payer: Self-pay

## 2021-10-05 NOTE — Telephone Encounter (Signed)
Pt LVM stating she is scheduled for a colonoscopy in [place] but wants to know more information.  I have reviewed pts record and found that she was in the hospital and seen by Dr. Dayna Barker who advised "Repeat upper endoscopy in 8 weeks to check healing. Can follow up with her hepatologist at [place] to get this done".  I have called the pt back and left a detailed message with this information.

## 2021-10-20 ENCOUNTER — Telehealth: Payer: Self-pay | Admitting: Medical Oncology

## 2021-10-20 NOTE — Telephone Encounter (Signed)
er

## 2021-10-20 NOTE — Telephone Encounter (Signed)
Pt has not had colonoscopy- ? " I failed to comply with Dr Worthy Flank recommendation for colonoscopy". ?( Mohamed referred pt in October 2022) ? ?I lvm on pts phone that the GI referral is closed and she needs to contact PCP for colonoscopy referral. ? ?Next CT and f/u in April  ?

## 2021-10-29 ENCOUNTER — Emergency Department (HOSPITAL_BASED_OUTPATIENT_CLINIC_OR_DEPARTMENT_OTHER): Payer: Medicare Other

## 2021-10-29 ENCOUNTER — Encounter (HOSPITAL_COMMUNITY): Payer: Self-pay | Admitting: Emergency Medicine

## 2021-10-29 ENCOUNTER — Inpatient Hospital Stay (HOSPITAL_COMMUNITY)
Admission: EM | Admit: 2021-10-29 | Discharge: 2021-11-01 | DRG: 308 | Disposition: A | Discharge status: Home / Self Care | Payer: Medicare Other | Attending: Internal Medicine | Admitting: Internal Medicine

## 2021-10-29 ENCOUNTER — Emergency Department (HOSPITAL_COMMUNITY): Payer: Medicare Other

## 2021-10-29 ENCOUNTER — Other Ambulatory Visit: Payer: Self-pay

## 2021-10-29 DIAGNOSIS — I480 Paroxysmal atrial fibrillation: Principal | ICD-10-CM | POA: Diagnosis present

## 2021-10-29 DIAGNOSIS — I959 Hypotension, unspecified: Secondary | ICD-10-CM | POA: Diagnosis present

## 2021-10-29 DIAGNOSIS — Z8249 Family history of ischemic heart disease and other diseases of the circulatory system: Secondary | ICD-10-CM

## 2021-10-29 DIAGNOSIS — Z85118 Personal history of other malignant neoplasm of bronchus and lung: Secondary | ICD-10-CM

## 2021-10-29 DIAGNOSIS — I10 Essential (primary) hypertension: Secondary | ICD-10-CM | POA: Diagnosis not present

## 2021-10-29 DIAGNOSIS — Z8719 Personal history of other diseases of the digestive system: Secondary | ICD-10-CM

## 2021-10-29 DIAGNOSIS — E119 Type 2 diabetes mellitus without complications: Secondary | ICD-10-CM

## 2021-10-29 DIAGNOSIS — J90 Pleural effusion, not elsewhere classified: Secondary | ICD-10-CM | POA: Diagnosis present

## 2021-10-29 DIAGNOSIS — J441 Chronic obstructive pulmonary disease with (acute) exacerbation: Principal | ICD-10-CM

## 2021-10-29 DIAGNOSIS — Z79890 Hormone replacement therapy (postmenopausal): Secondary | ICD-10-CM

## 2021-10-29 DIAGNOSIS — Z886 Allergy status to analgesic agent status: Secondary | ICD-10-CM

## 2021-10-29 DIAGNOSIS — K7030 Alcoholic cirrhosis of liver without ascites: Secondary | ICD-10-CM | POA: Diagnosis present

## 2021-10-29 DIAGNOSIS — Z7984 Long term (current) use of oral hypoglycemic drugs: Secondary | ICD-10-CM

## 2021-10-29 DIAGNOSIS — K7031 Alcoholic cirrhosis of liver with ascites: Secondary | ICD-10-CM | POA: Diagnosis present

## 2021-10-29 DIAGNOSIS — Z20822 Contact with and (suspected) exposure to covid-19: Secondary | ICD-10-CM | POA: Diagnosis present

## 2021-10-29 DIAGNOSIS — I4891 Unspecified atrial fibrillation: Secondary | ICD-10-CM | POA: Diagnosis present

## 2021-10-29 DIAGNOSIS — Z806 Family history of leukemia: Secondary | ICD-10-CM

## 2021-10-29 DIAGNOSIS — M7989 Other specified soft tissue disorders: Secondary | ICD-10-CM

## 2021-10-29 DIAGNOSIS — R0602 Shortness of breath: Secondary | ICD-10-CM | POA: Diagnosis not present

## 2021-10-29 DIAGNOSIS — Z79899 Other long term (current) drug therapy: Secondary | ICD-10-CM

## 2021-10-29 DIAGNOSIS — Z885 Allergy status to narcotic agent status: Secondary | ICD-10-CM

## 2021-10-29 DIAGNOSIS — J9601 Acute respiratory failure with hypoxia: Secondary | ICD-10-CM | POA: Diagnosis present

## 2021-10-29 DIAGNOSIS — Z902 Acquired absence of lung [part of]: Secondary | ICD-10-CM

## 2021-10-29 DIAGNOSIS — K7460 Unspecified cirrhosis of liver: Secondary | ICD-10-CM

## 2021-10-29 DIAGNOSIS — E039 Hypothyroidism, unspecified: Secondary | ICD-10-CM

## 2021-10-29 DIAGNOSIS — Z87891 Personal history of nicotine dependence: Secondary | ICD-10-CM

## 2021-10-29 DIAGNOSIS — Z820 Family history of epilepsy and other diseases of the nervous system: Secondary | ICD-10-CM

## 2021-10-29 DIAGNOSIS — E785 Hyperlipidemia, unspecified: Secondary | ICD-10-CM | POA: Diagnosis present

## 2021-10-29 DIAGNOSIS — I3139 Other pericardial effusion (noninflammatory): Secondary | ICD-10-CM | POA: Diagnosis present

## 2021-10-29 DIAGNOSIS — Z888 Allergy status to other drugs, medicaments and biological substances status: Secondary | ICD-10-CM

## 2021-10-29 LAB — CBC WITH DIFFERENTIAL/PLATELET
Abs Immature Granulocytes: 0.03 10*3/uL (ref 0.00–0.07)
Basophils Absolute: 0 10*3/uL (ref 0.0–0.1)
Basophils Relative: 1 %
Eosinophils Absolute: 0.3 10*3/uL (ref 0.0–0.5)
Eosinophils Relative: 8 %
HCT: 37.7 % (ref 36.0–46.0)
Hemoglobin: 12.1 g/dL (ref 12.0–15.0)
Immature Granulocytes: 1 %
Lymphocytes Relative: 12 %
Lymphs Abs: 0.5 10*3/uL — ABNORMAL LOW (ref 0.7–4.0)
MCH: 34.5 pg — ABNORMAL HIGH (ref 26.0–34.0)
MCHC: 32.1 g/dL (ref 30.0–36.0)
MCV: 107.4 fL — ABNORMAL HIGH (ref 80.0–100.0)
Monocytes Absolute: 0.4 10*3/uL (ref 0.1–1.0)
Monocytes Relative: 11 %
Neutro Abs: 2.7 10*3/uL (ref 1.7–7.7)
Neutrophils Relative %: 67 %
Platelets: 216 10*3/uL (ref 150–400)
RBC: 3.51 MIL/uL — ABNORMAL LOW (ref 3.87–5.11)
RDW: 15.6 % — ABNORMAL HIGH (ref 11.5–15.5)
WBC: 4 10*3/uL (ref 4.0–10.5)
nRBC: 0 % (ref 0.0–0.2)

## 2021-10-29 LAB — I-STAT VENOUS BLOOD GAS, ED
Acid-base deficit: 1 mmol/L (ref 0.0–2.0)
Bicarbonate: 23.2 mmol/L (ref 20.0–28.0)
Calcium, Ion: 1.08 mmol/L — ABNORMAL LOW (ref 1.15–1.40)
HCT: 40 % (ref 36.0–46.0)
Hemoglobin: 13.6 g/dL (ref 12.0–15.0)
O2 Saturation: 58 %
Potassium: 3.6 mmol/L (ref 3.5–5.1)
Sodium: 136 mmol/L (ref 135–145)
TCO2: 24 mmol/L (ref 22–32)
pCO2, Ven: 37.4 mmHg — ABNORMAL LOW (ref 44–60)
pH, Ven: 7.4 (ref 7.25–7.43)
pO2, Ven: 30 mmHg — CL (ref 32–45)

## 2021-10-29 LAB — RESP PANEL BY RT-PCR (FLU A&B, COVID) ARPGX2
Influenza A by PCR: NEGATIVE
Influenza B by PCR: NEGATIVE
SARS Coronavirus 2 by RT PCR: NEGATIVE

## 2021-10-29 LAB — BRAIN NATRIURETIC PEPTIDE: B Natriuretic Peptide: 191.2 pg/mL — ABNORMAL HIGH (ref 0.0–100.0)

## 2021-10-29 LAB — TSH: TSH: 3.695 u[IU]/mL (ref 0.350–4.500)

## 2021-10-29 LAB — COMPREHENSIVE METABOLIC PANEL
ALT: 22 U/L (ref 0–44)
AST: 42 U/L — ABNORMAL HIGH (ref 15–41)
Albumin: 2.4 g/dL — ABNORMAL LOW (ref 3.5–5.0)
Alkaline Phosphatase: 121 U/L (ref 38–126)
Anion gap: 9 (ref 5–15)
BUN: 14 mg/dL (ref 8–23)
CO2: 26 mmol/L (ref 22–32)
Calcium: 8.7 mg/dL — ABNORMAL LOW (ref 8.9–10.3)
Chloride: 100 mmol/L (ref 98–111)
Creatinine, Ser: 0.72 mg/dL (ref 0.44–1.00)
GFR, Estimated: >60 mL/min (ref >60)
Glucose, Bld: 120 mg/dL — ABNORMAL HIGH (ref 70–99)
Potassium: 3.8 mmol/L (ref 3.5–5.1)
Sodium: 135 mmol/L (ref 135–145)
Total Bilirubin: 4.6 mg/dL — ABNORMAL HIGH (ref 0.3–1.2)
Total Protein: 6.8 g/dL (ref 6.5–8.1)

## 2021-10-29 LAB — TROPONIN I (HIGH SENSITIVITY): Troponin I (High Sensitivity): 12 ng/L (ref <18)

## 2021-10-29 LAB — D-DIMER, QUANTITATIVE: D-Dimer, Quant: 3.22 ug/mL-FEU — ABNORMAL HIGH (ref 0.00–0.50)

## 2021-10-29 MED ORDER — METHYLPREDNISOLONE SODIUM SUCC 125 MG IJ SOLR
125.0000 mg | Freq: Once | INTRAMUSCULAR | Status: AC
  Administered 2021-10-29: 125 mg via INTRAVENOUS
  Filled 2021-10-29: qty 2

## 2021-10-29 MED ORDER — LEVOTHYROXINE SODIUM 50 MCG PO TABS
50.0000 ug | ORAL_TABLET | Freq: Every day | ORAL | Status: DC
  Administered 2021-10-31 – 2021-11-01 (×2): 50 ug via ORAL
  Filled 2021-10-29 (×3): qty 1

## 2021-10-29 MED ORDER — IOHEXOL 350 MG/ML SOLN
80.0000 mL | Freq: Once | INTRAVENOUS | Status: AC | PRN
  Administered 2021-10-29: 80 mL via INTRAVENOUS

## 2021-10-29 MED ORDER — NADOLOL 20 MG PO TABS
20.0000 mg | ORAL_TABLET | Freq: Every evening | ORAL | Status: DC
  Filled 2021-10-29: qty 1

## 2021-10-29 MED ORDER — SODIUM CHLORIDE 0.9 % IV BOLUS
1000.0000 mL | Freq: Once | INTRAVENOUS | Status: AC
  Administered 2021-10-29: 1000 mL via INTRAVENOUS

## 2021-10-29 MED ORDER — DILTIAZEM HCL 25 MG/5ML IV SOLN
10.0000 mg | Freq: Once | INTRAVENOUS | Status: AC
  Administered 2021-10-29: 10 mg via INTRAVENOUS

## 2021-10-29 MED ORDER — IPRATROPIUM-ALBUTEROL 0.5-2.5 (3) MG/3ML IN SOLN
3.0000 mL | Freq: Once | RESPIRATORY_TRACT | Status: AC
  Administered 2021-10-29: 3 mL via RESPIRATORY_TRACT
  Filled 2021-10-29: qty 3

## 2021-10-29 MED ORDER — DILTIAZEM HCL-DEXTROSE 125-5 MG/125ML-% IV SOLN (PREMIX)
5.0000 mg/h | INTRAVENOUS | Status: DC
  Administered 2021-10-29: 5 mg/h via INTRAVENOUS
  Filled 2021-10-29: qty 125

## 2021-10-29 MED ORDER — PANTOPRAZOLE SODIUM 40 MG PO TBEC
40.0000 mg | DELAYED_RELEASE_TABLET | Freq: Two times a day (BID) | ORAL | Status: DC
Start: 2021-10-29 — End: 2021-11-01
  Administered 2021-10-29 – 2021-11-01 (×6): 40 mg via ORAL
  Filled 2021-10-29 (×6): qty 1

## 2021-10-29 NOTE — ED Provider Notes (Signed)
[facility] EMERGENCY DEPARTMENT Provider Note   CSN: [id number] Arrival date & time: 10/29/21  1452     History  Chief Complaint  Patient presents with   Shortness of Breath    Sandy [name] is a 66 y.o. female.  Patient presented to ER chief complaint shortness of breath and left leg swelling.  She has noted symptoms for about 2 days now.  Denies any chest pain.  Denies any fevers.  Positive mild cough.  No vomiting or diarrhea reported.      Home Medications Prior to Admission medications   Medication Sig Start Date End Date Taking? Authorizing Provider  atorvastatin (LIPITOR) 10 MG tablet Take 10 mg by mouth at bedtime.    Yes [provider]  FeFum-FePoly-FA-B Cmp-C-Biot (INTEGRA PLUS) CAPS TAKE 1 CAPSULE BY MOUTH DAILY Patient taking differently: Take 1 capsule by mouth every evening. 06/28/21  Yes Curt Bears, MD  levothyroxine (SYNTHROID) 50 MCG tablet Take 50 mcg by mouth daily before breakfast.   Yes [provider]  losartan (COZAAR) 100 MG tablet Take 1 tablet by mouth every evening. 02/12/20  Yes [provider]  metFORMIN (GLUCOPHAGE-XR) 500 MG 24 hr tablet Take 500 mg by mouth at bedtime.   Yes [provider]  nadolol (CORGARD) 20 MG tablet Take 20 mg by mouth every evening. 06/15/20  Yes [provider]  omeprazole (PRILOSEC) 40 MG capsule Take 1 capsule (40 mg total) by mouth in the morning and at bedtime. 09/26/21  Yes Thurnell Lose, MD  spironolactone (ALDACTONE) 25 MG tablet TAKE 1 TABLET(25 MG) BY MOUTH IN THE MORNING Patient not taking: Reported on 06/29/2021 09/30/20   Rex Kras, DO      Allergies    Codeine, Nsaids, and Ceftriaxone    Review of Systems   Review of Systems  Constitutional:  Negative for fever.  HENT:  Negative for ear pain.   Eyes:  Negative for pain.  Respiratory:  Positive for cough and shortness of breath.   Cardiovascular:  Negative for chest pain.   Gastrointestinal:  Negative for abdominal pain.  Genitourinary:  Negative for flank pain.  Musculoskeletal:  Negative for back pain.  Skin:  Negative for rash.  Neurological:  Negative for headaches.   Physical Exam Updated Vital Signs BP 109/71    Pulse (!) 145    Temp (!) 97.4 F (36.3 C) (Oral)    Resp (!) 25    SpO2 100%  Physical Exam Constitutional:      General: She is not in acute distress.    Appearance: Normal appearance.  HENT:     Head: Normocephalic.     Nose: Nose normal.  Eyes:     Extraocular Movements: Extraocular movements intact.  Cardiovascular:     Rate and Rhythm: Normal rate.  Pulmonary:     Effort: Tachypnea and accessory muscle usage present.     Breath sounds: No wheezing.  Musculoskeletal:        General: Normal range of motion.     Cervical back: Normal range of motion.     Right lower leg: No tenderness.     Left lower leg: No tenderness. Edema present.  Neurological:     General: No focal deficit present.     Mental Status: She is alert. Mental status is at baseline.    ED Results / Procedures / Treatments   Labs (all labs ordered are listed, but only abnormal results are displayed) Labs Reviewed  CBC WITH DIFFERENTIAL/PLATELET - Abnormal; Notable for the following components:      Result Value   RBC 3.51 (*)    MCV 107.4 (*)    MCH 34.5 (*)    RDW 15.6 (*)    Lymphs Abs 0.5 (*)    All other components within normal limits  COMPREHENSIVE METABOLIC PANEL - Abnormal; Notable for the following components:   Glucose, Bld 120 (*)    Calcium 8.7 (*)    Albumin 2.4 (*)    AST 42 (*)    Total Bilirubin 4.6 (*)    All other components within normal limits  BRAIN NATRIURETIC PEPTIDE - Abnormal; Notable for the following components:   B Natriuretic Peptide 191.2 (*)    All other components within normal limits  D-DIMER, QUANTITATIVE (NOT AT [facility]) - Abnormal; Notable for the following components:   D-Dimer, Quant 3.22 (*)    All other  components within normal limits  I-STAT VENOUS BLOOD GAS, ED - Abnormal; Notable for the following components:   pCO2, Ven 37.4 (*)    pO2, Ven 30 (*)    Calcium, Ion 1.08 (*)    All other components within normal limits  RESP PANEL BY RT-PCR (FLU A&B, COVID) ARPGX2  BLOOD GAS, VENOUS  CBC  TSH  TROPONIN I (HIGH SENSITIVITY)    EKG EKG Interpretation  Date/Time:  Friday October 29 2021 15:11:37 EST Ventricular Rate:  98 PR Interval:  126 QRS Duration: 91 QT Interval:  388 QTC Calculation: 496 R Axis:   51 Text Interpretation: Sinus rhythm Borderline prolonged QT interval Confirmed by Thamas Jaegers ([id number]) on 10/29/2021 3:34:42 PM  Radiology CT Angio Chest PE W and/or Wo Contrast  Result Date: 10/29/2021 CLINICAL DATA:  Shortness of breath EXAM: CT ANGIOGRAPHY CHEST WITH CONTRAST TECHNIQUE: Multidetector CT imaging of the chest was performed using the standard protocol during bolus administration of intravenous contrast. Multiplanar CT image reconstructions and MIPs were obtained to evaluate the vascular anatomy. RADIATION DOSE REDUCTION: This exam was performed according to the departmental dose-optimization program which includes automated exposure control, adjustment of the mA and/or kV according to patient size and/or use of iterative reconstruction technique. CONTRAST:  78mL OMNIPAQUE IOHEXOL 350 MG/ML SOLN COMPARISON:  Chest x-ray 10/29/2021, chest CT 10/23/2018, 05/21/2019, 08/26/2019, 01/06/2020, 06/12/2020, 01/01/2021 FINDINGS: Cardiovascular: Satisfactory opacification of the pulmonary arteries to the segmental level. No evidence of pulmonary embolism. Mild aortic atherosclerosis. No aneurysm or dissection. Coronary vascular calcification. Borderline to mild cardiomegaly. Small pericardial effusion which is increased compared to prior. Mediastinum/Nodes: Midline trachea. No thyroid mass. Esophagus within normal limits. No increasing adenopathy. Lungs/Pleura: Volume loss right thorax  with shift of mediastinal contents to the right consistent with history of prior partial lung resection. Slowly progressive consolidation most notable at the right apex. Underlying emphysema. Increased right pleural effusion. Upper Abdomen: Increased ascites within the upper abdomen. Cirrhotic morphology of the liver. Partially visualized right renal cysts. Spleen is incompletely visualized but appears enlarged. Musculoskeletal: No acute osseous abnormality Review of the MIP images confirms the above findings. IMPRESSION: 1. Negative for acute pulmonary embolus. 2. Volume loss in the right thorax with shift of mediastinal contents to the right consistent with history of prior lung surgery. Architectural distortion and soft tissue density in the right upper lobe and hilar region some of which is felt secondary to post therapy change, however there has been slowly progressive consolidation most notable at the right apex. Findings could be due to superimposed infection though neoplasm is  also a concern. Pulmonary follow-up with possible PET CT follow-up is suggested. Overall there is increased right pleural effusion compared to the previous exams. 3. Liver cirrhosis and splenomegaly. Increased ascites within the upper abdomen 4. Small pericardial effusion which is also increased compared to prior. Aortic Atherosclerosis (ICD10-I70.0) and Emphysema (ICD10-J43.9). Electronically Signed   By: Donavan Foil M.D.   On: 10/29/2021 18:46   DG Chest Port 1 View  Result Date: 10/29/2021 CLINICAL DATA:  Shortness of breath EXAM: PORTABLE CHEST 1 VIEW COMPARISON:  09/23/2021, 01/01/2021, chest CT 01/01/2021, 06/12/2020. FINDINGS: Volume loss and mediastinal shift to the right. Architectural distortion, parenchymal consolidation and pleural thickening or effusion without significant change since 09/23/2021. There may be slight increased pleural effusion or thickening compared to more remote examination from 2021. Left lung is  grossly clear. IMPRESSION: 1. Volume loss and postsurgical changes in the right thorax with consolidation and distortion in the right hilar region as before. 2. Circumferential pleural thickening stable with most recent priors, may be slightly increased compared to more remote exams potentially due to presence of increased pleural effusion or possible pleural disease. Follow-up chest CT may be helpful to further assess. Electronically Signed   By: Donavan Foil M.D.   On: 10/29/2021 16:17   VAS [place] LOWER EXTREMITY VENOUS (DVT) (7a-7p)  Result Date: 10/29/2021  Lower Venous DVT Study Patient Name:  Sandy [name]  Date of Exam:   10/29/2021 Medical Rec #: [id number]        Accession #:    [id number] Date of Birth: [birth year]        Patient Gender: F Patient Age:   75 years Exam Location:  [facility] Procedure:      VAS [place] LOWER EXTREMITY VENOUS (DVT) Referring Phys: Zanden Colver --------------------------------------------------------------------------------  Indications: Swelling, and SOB.  Comparison Study: no prior studies Performing Technologist: Darlin Coco RDMS, RVT  Examination Guidelines: A complete evaluation includes B-mode imaging, spectral Doppler, color Doppler, and power Doppler as needed of all accessible portions of each vessel. Bilateral testing is considered an integral part of a complete examination. Limited examinations for reoccurring indications may be performed as noted. The reflux portion of the exam is performed with the patient in reverse Trendelenburg.  +-----+---------------+---------+-----------+----------+--------------+  RIGHT Compressibility Phasicity Spontaneity Properties Thrombus Aging  +-----+---------------+---------+-----------+----------+--------------+  CFV   Full            Yes       Yes                                    +-----+---------------+---------+-----------+----------+--------------+    +---------+---------------+---------+-----------+----------+-------------------+  LEFT      Compressibility Phasicity Spontaneity Properties Thrombus Aging       +---------+---------------+---------+-----------+----------+-------------------+  CFV                       Yes       Yes                                         +---------+---------------+---------+-----------+----------+-------------------+  SFJ                       Yes       Yes                                         +---------+---------------+---------+-----------+----------+-------------------+  FV Prox   Full                                                                  +---------+---------------+---------+-----------+----------+-------------------+  FV Mid    Full                                                                  +---------+---------------+---------+-----------+----------+-------------------+  FV Distal Full                                                                  +---------+---------------+---------+-----------+----------+-------------------+  PFV       Full                                                                  +---------+---------------+---------+-----------+----------+-------------------+  POP       Full                                                                  +---------+---------------+---------+-----------+----------+-------------------+  Soleal    Full                                             notably dilated                                                                  Soleal Vein                                                                      observed during  Peroneal evalution   +---------+---------------+---------+-----------+----------+-------------------+  Gastroc   Full                                                                  +---------+---------------+---------+-----------+----------+-------------------+      Summary: RIGHT: - No evidence of common femoral vein obstruction.  LEFT: - There is no evidence of deep vein thrombosis in the lower extremity. Notably dilated soleal vein without evidence of obstruction.  - No cystic structure found in the popliteal fossa.  *See table(s) above for measurements and observations. Electronically signed by Monica Martinez MD on 10/29/2021 at 5:23:08 PM.    Final     Procedures .Critical Care Performed by: Luna Fuse, MD Authorized by: Luna Fuse, MD   Critical care provider statement:    Critical care time (minutes):  40   Critical care time was exclusive of:  Separately billable procedures and treating other patients and teaching time   Critical care was necessary to treat or prevent imminent or life-threatening deterioration of the following conditions:  Respiratory failure and cardiac failure    Medications Ordered in ED Medications  diltiazem (CARDIZEM) 125 mg in dextrose 5% 125 mL (1 mg/mL) infusion (7.5 mg/hr Intravenous Rate/Dose Change 10/29/21 2207)  levothyroxine (SYNTHROID) tablet 50 mcg (has no administration in time range)  pantoprazole (PROTONIX) EC tablet 40 mg (40 mg Oral Given 10/29/21 2210)  iohexol (OMNIPAQUE) 350 MG/ML injection 80 mL (80 mLs Intravenous Contrast Given 10/29/21 1757)  ipratropium-albuterol (DUONEB) 0.5-2.5 (3) MG/3ML nebulizer solution 3 mL (3 mLs Nebulization Given 10/29/21 2002)  methylPREDNISolone sodium succinate (SOLU-MEDROL) 125 mg/2 mL injection 125 mg (125 mg Intravenous Given 10/29/21 2019)  sodium chloride 0.9 % bolus 1,000 mL (1,000 mLs Intravenous New Bag/Given 10/29/21 2122)  diltiazem (CARDIZEM) injection 10 mg (10 mg Intravenous Given 10/29/21 2139)    ED Course/ Medical Decision Making/ A&P                           Medical Decision Making Amount and/or Complexity of Data Reviewed Labs: ordered. Radiology: ordered.  Risk Prescription drug management. Decision regarding  hospitalization.   Patient on telemetry showing mild sinus tachycardia heart rates around 90-100 bpm, sinus rhythm.  Patient comorbidities include prior lung cancer and lobectomy, cirrhosis, variceal history.  Review of record shows primary dental office visit 3 days ago for teeth issues.  Work-up today included blood work.  White count normal hemoglobin normal chemistry remarkable however D-dimer elevated at 3.  Ultrasound shows no evidence of DVT.  CT angio of the chest pursued with no evidence of pulmonary embolism, however questionable infection in the right lung.  Patient given breathing treatment here with mild improvement only.  However she went into an A-fib rhythm appears tachycardic and irregular.  IV diltiazem started but blood pressure is soft at about 629-528 systolic.  Consultation with hospitalist for admission.  Consultation requested with gastroenterology for history of variceal bleeding now requiring possible anticoagulation for paroxysmal atrial fibrillation.  Anticoagulation held for now due to recent history of GI bleed.        Final Clinical Impression(s) / ED Diagnoses Final diagnoses:  COPD exacerbation ([place])  Atrial fibrillation with rapid ventricular response (HCC)  Cirrhosis of liver  without ascites, unspecified hepatic cirrhosis type [facility])    Rx / DC Orders ED Discharge Orders     None         Luna Fuse, MD 10/29/21 2232

## 2021-10-29 NOTE — ED Triage Notes (Signed)
Patient coming from home, complaint of shortness of breath and lower extremity swelling. ?

## 2021-10-29 NOTE — Assessment & Plan Note (Signed)
S/p Right lumbectomy.   ?-CTA chest today showed increasing consolidation to the right apex suspicious of infectious versus neoplasm.  Doubt infection given lack of symptoms, fever or leukocytosis.  Advised to follow-up with oncology.  Follows with oncologist Dr. Julien Nordmann.  Has follow-up in April. ?

## 2021-10-29 NOTE — Assessment & Plan Note (Signed)
Last hemoglobin A1c of 4.3 in 2/2 ?

## 2021-10-29 NOTE — ED Notes (Signed)
Patient is back from vascular  ?

## 2021-10-29 NOTE — Assessment & Plan Note (Signed)
-   Repeat TSH as stated above.  Continue Synthroid. ?

## 2021-10-29 NOTE — Assessment & Plan Note (Addendum)
-  Rate up to 150. Pt had afib with RVR with recent hospitalization but spontaneous converted. She also had acute GI bleed so anticoagulation was not started. ?-CHA2DSVASc score of at least 4- however base on her hx of varices bleed the risk of anticoagulation outweights the benefit. She has hx of Variceal bleeding requiring intubation and transfusion of 9u pRBC and endoscopic band ligation in 2016. 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  ? -continue IV diltiazem infusion. Later developed transient hypotension to systolic high 15B at times. Discussed with cardiology fellow Dr. Humphrey Rolls that if her BP is not amenable to diltiazem, could consider IV Digoxin 218mcg with repeat in q6hrs.  ?-check Echo ?-repeat TSH. 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  ?

## 2021-10-29 NOTE — Assessment & Plan Note (Addendum)
Currently on IV diltiazem infusion.  Hold home beta-blocker for now given borderline low BP and transient hypotension after initial 10mg  Diltiazem bolus.  ?

## 2021-10-29 NOTE — ED Notes (Signed)
Patient to vascular center ?

## 2021-10-29 NOTE — Assessment & Plan Note (Signed)
Has history of esophageal varices.  Follows with hepatologist at [facility]. ?-Reports last drink and new year and perhaps only has about 4 drinks per year ?

## 2021-10-29 NOTE — H&P (Addendum)
History and Physical    [name] PPI:[id number] DOB: 11/22/1955 DOA: 10/29/2021 DOS: the patient was seen and examined on 10/30/2021 PCP: [name], MD  Patient coming from: Home  Chief Complaint:  Chief Complaint  Patient presents with   Shortness of Breath   HPI: Sandy [name] is a 66 y.o. female with medical history significant of alcoholic cirrhosis with esophageal varices, history of GI bleed, lung cancer s/p right lobectomy, type 2 diabetes, hypertension hyperlipidemia, and hypothyroidism who presents with concerns of increasing shortness of breath.   She was recently admitted from 2/2 through 2/5 with acute upper GI bleed with hematemesis.  She underwent EGD on 09/24/2021 showing LA grade D esophagitis with no bleeding.  Portal hypertensive gastropathy, few gastric polyps and a single nonbleeding angioectasia in the duodenum.  She was treated with APC and was placed on PPI for 8 weeks and antibiotics for SBP prophylaxis for a week.  She also was noted to have runs of SVT that was treated with adenosine and found to be in atrial fibrillation with RVR.  This later spontaneously resolved without intervention.  She was continued on her home dose of beta-blocker.  In the ED, she was afebrile initially normal sinus rhythm but later had atrial fibrillation with RVR with rates up to 140.  BNP of 191.  Troponin of 12. CBC without leukocytosis, hemoglobin of 12 from prior of 10.  No significant electrolyte abnormalities. D-dimer was elevated at 3.22 but CTA was negative for PE however showed increasing consolidation to the right apex concerning for infection versus neoplasm.  There is also increasing right pleural effusion and increased ascites.  There is small but increased pericardial effusion.    Review of Systems: As mentioned in the history of present illness. All other systems reviewed and are negative. Past Medical History:  Diagnosis Date   Adenocarcinoma of  lung, stage 3, right ([place]) 2020   Arthritis    Asthma    Cirrhosis (HCC)    COPD (chronic obstructive pulmonary disease) (HCC)    Dyslipidemia    Dyspnea    Esophageal varices (HCC)    History of blood transfusion    History of hiatal hernia    Hypertension    Malignant neoplasm of bronchus of lower lobe, right ([place]) 01/10/2019   Pneumonia    Type 2 diabetes mellitus (HCC)    Type II   Past Surgical History:  Procedure Laterality Date   ABDOMINAL HYSTERECTOMY     ANTERIOR CRUCIATE LIGAMENT REPAIR Left    x 2   ESOPHAGOGASTRODUODENOSCOPY     ESOPHAGOGASTRODUODENOSCOPY Left 09/24/2021   Procedure: ESOPHAGOGASTRODUODENOSCOPY (EGD);  Surgeon: Sharyn Creamer, MD;  Location: [place];  Service: Gastroenterology;  Laterality: Left;   HEMOSTASIS CONTROL  09/24/2021   Procedure: HEMOSTASIS CONTROL;  Surgeon: Sharyn Creamer, MD;  Location: [facility] ENDOSCOPY;  Service: Gastroenterology;;   LOBECTOMY Right 01/07/2019   Procedure: RIGHT LOWER LOBE LOBECTOMY AND INTERCOSTAL NERVE BLOCK;  Surgeon: Melrose Nakayama, MD;  Location: [place];  Service: Thoracic;  Laterality: Right;   VIDEO ASSISTED THORACOSCOPY (VATS)/WEDGE RESECTION Right 01/07/2019   Procedure: VIDEO ASSISTED THORACOSCOPY (VATS) RIGHT LOWER LOBE WEDGE RESECTION;  Surgeon: Melrose Nakayama, MD;  Location: [place];  Service: Thoracic;  Laterality: Right;   Social History:  reports that she quit smoking about 7 years ago. Her smoking use included cigarettes. She has a 37.00 pack-year smoking history. She has never used smokeless tobacco. She reports that she  does not currently use alcohol after a past usage of about 1.0 standard drink per week. She reports that she does not currently use drugs after having used the following drugs: Marijuana.  Allergies  Allergen Reactions   Codeine Nausea And Vomiting   Nsaids     Avoid due to esophageal varices   Ceftriaxone Rash    Family History  Problem Relation Age of Onset   Leukemia  Mother    Parkinson's disease Father    Hypertension Sister    Lung disease Neg Hx     Prior to Admission medications   Medication Sig Start Date End Date Taking? Authorizing Provider  atorvastatin (LIPITOR) 10 MG tablet Take 10 mg by mouth at bedtime.     [provider]  FeFum-FePoly-FA-B Cmp-C-Biot (INTEGRA PLUS) CAPS TAKE 1 CAPSULE BY MOUTH DAILY 06/28/21   Curt Bears, MD  levothyroxine (SYNTHROID) 25 MCG tablet Take 25 mcg by mouth daily before breakfast. 09/04/19   [provider]  losartan (COZAAR) 100 MG tablet Take 1 tablet by mouth daily. 02/12/20   [provider]  metFORMIN (GLUCOPHAGE-XR) 500 MG 24 hr tablet Take 500 mg by mouth at bedtime.    [provider]  nadolol (CORGARD) 20 MG tablet Take 20 mg by mouth every evening. 06/15/20   [provider]  omeprazole (PRILOSEC) 40 MG capsule Take 1 capsule (40 mg total) by mouth in the morning and at bedtime. 09/26/21   Thurnell Lose, MD  spironolactone (ALDACTONE) 25 MG tablet TAKE 1 TABLET(25 MG) BY MOUTH IN THE MORNING Patient not taking: Reported on 06/29/2021 09/30/20   Rex Kras, DO    Physical Exam: Vitals:   10/29/21 2345 10/30/21 0000 10/30/21 0023 10/30/21 0056  BP: 121/69 104/61 99/61 123/66  Pulse: (!) 126 (!) 117 (!) 112 96  Resp: 15 (!) 23 (!) 24 20  Temp:    (!) 97.4 F (36.3 C)  TempSrc:    Oral  SpO2: 100% 100% 100% 99%  Weight:    57.2 kg  Height:    5\' 3"  (1.6 m)   Constitutional: Ill-appearing cachectic thin elderly female sitting upright in bed with tachypnea and increased work of breathing. Eyes: lids and conjunctivae normal ENMT: Mucous membranes are moist.  Neck: normal, supple Respiratory: Diminished lung sounds throughout but no wheezing, no crackles.  Increased work of breathing with exertion but able to speak in full sentences.  Cardiovascular: Regular rate and rhythm, no murmurs / rubs / gallops. No extremity edema. 2+ pedal pulses. No carotid  bruits.  Abdomen: Soft, moderately distended without any fluid bolus waves, no tenderness,  Bowel sounds positive.  Musculoskeletal: no clubbing / cyanosis. No joint deformity upper and lower extremities.  Muscle wasting in all extremities. Skin: no rashes, lesions, ulcers. No induration Neurologic: CN 2-12 grossly intact. Strength 5/5 in all 4.  Psychiatric: Normal judgment and insight. Alert and oriented x 3. Normal mood. Data Reviewed:  See HPI  Assessment and Plan: * Atrial fibrillation with RVR ([place]) -Rate up to 150. Pt had afib with RVR with recent hospitalization but spontaneous converted. She also had acute GI bleed so anticoagulation was not started. -CHA2DSVASc score of at least 4- however base on her hx of varices bleed the risk of anticoagulation outweights the benefit. She has hx of Variceal bleeding requiring intubation and transfusion of 9u pRBC and endoscopic band ligation in 2016. Also had recurrent bleed just last month with non-bleeding angioectasia in duodenum tx with argon plasma  coagulation.   -continue IV diltiazem infusion. Later developed transient hypotension to systolic high 84Y at times. Discussed with cardiology fellow Dr. Humphrey Rolls that if her BP is not amenable to diltiazem, could consider IV Digoxin 265mcg with repeat in q6hrs.  -check Echo -repeat TSH. 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   Hypothyroidism - Repeat TSH as stated above.  Continue Synthroid.  History of lung cancer S/p Right lumbectomy.   -CTA chest today showed increasing consolidation to the right apex suspicious of infectious versus neoplasm.  Doubt infection given lack of symptoms, fever or leukocytosis.  Advised to follow-up with oncology.  Follows with oncologist Dr. Julien Nordmann.  Has follow-up in April.  Essential hypertension Currently on IV diltiazem infusion.  Hold home beta-blocker for now given borderline low BP and transient hypotension  after initial 10mg  Diltiazem bolus.   Alcoholic cirrhosis (HCC) Has history of esophageal varices.  Follows with hepatologist at [facility]. -Reports last drink and new year and perhaps only has about 4 drinks per year  Type 2 diabetes mellitus ([place]) Last hemoglobin A1c of 4.3 in 2/2      Advance Care Planning:   Code Status: Full Code   Consults: none  Family Communication: No family at bedside  Severity of Illness: The appropriate patient status for this patient is OBSERVATION. Observation status is judged to be reasonable and necessary in order to provide the required intensity of service to ensure the patient's safety. The patient's presenting symptoms, physical exam findings, and initial radiographic and laboratory data in the context of their medical condition is felt to place them at decreased risk for further clinical deterioration. Furthermore, it is anticipated that the patient will be medically stable for discharge from the hospital within 2 midnights of admission.   Author: Orene Desanctis, DO 10/30/2021 1:01 AM  For on call review [email or web address].

## 2021-10-29 NOTE — ED Notes (Signed)
Patient transported to CT 

## 2021-10-30 ENCOUNTER — Inpatient Hospital Stay (HOSPITAL_COMMUNITY): Payer: Medicare Other

## 2021-10-30 ENCOUNTER — Observation Stay (HOSPITAL_COMMUNITY): Payer: Medicare Other

## 2021-10-30 DIAGNOSIS — E119 Type 2 diabetes mellitus without complications: Secondary | ICD-10-CM | POA: Diagnosis present

## 2021-10-30 DIAGNOSIS — I10 Essential (primary) hypertension: Secondary | ICD-10-CM | POA: Diagnosis present

## 2021-10-30 DIAGNOSIS — Z885 Allergy status to narcotic agent status: Secondary | ICD-10-CM | POA: Diagnosis not present

## 2021-10-30 DIAGNOSIS — Z79899 Other long term (current) drug therapy: Secondary | ICD-10-CM | POA: Diagnosis not present

## 2021-10-30 DIAGNOSIS — E785 Hyperlipidemia, unspecified: Secondary | ICD-10-CM | POA: Diagnosis present

## 2021-10-30 DIAGNOSIS — I3139 Other pericardial effusion (noninflammatory): Secondary | ICD-10-CM | POA: Diagnosis present

## 2021-10-30 DIAGNOSIS — E039 Hypothyroidism, unspecified: Secondary | ICD-10-CM | POA: Diagnosis present

## 2021-10-30 DIAGNOSIS — Z820 Family history of epilepsy and other diseases of the nervous system: Secondary | ICD-10-CM | POA: Diagnosis not present

## 2021-10-30 DIAGNOSIS — Z888 Allergy status to other drugs, medicaments and biological substances status: Secondary | ICD-10-CM | POA: Diagnosis not present

## 2021-10-30 DIAGNOSIS — J90 Pleural effusion, not elsewhere classified: Secondary | ICD-10-CM | POA: Diagnosis present

## 2021-10-30 DIAGNOSIS — I959 Hypotension, unspecified: Secondary | ICD-10-CM | POA: Diagnosis present

## 2021-10-30 DIAGNOSIS — Z20822 Contact with and (suspected) exposure to covid-19: Secondary | ICD-10-CM | POA: Diagnosis present

## 2021-10-30 DIAGNOSIS — Z886 Allergy status to analgesic agent status: Secondary | ICD-10-CM | POA: Diagnosis not present

## 2021-10-30 DIAGNOSIS — J9601 Acute respiratory failure with hypoxia: Secondary | ICD-10-CM | POA: Diagnosis present

## 2021-10-30 DIAGNOSIS — Z8249 Family history of ischemic heart disease and other diseases of the circulatory system: Secondary | ICD-10-CM | POA: Diagnosis not present

## 2021-10-30 DIAGNOSIS — Z806 Family history of leukemia: Secondary | ICD-10-CM | POA: Diagnosis not present

## 2021-10-30 DIAGNOSIS — Z7984 Long term (current) use of oral hypoglycemic drugs: Secondary | ICD-10-CM | POA: Diagnosis not present

## 2021-10-30 DIAGNOSIS — J441 Chronic obstructive pulmonary disease with (acute) exacerbation: Secondary | ICD-10-CM | POA: Diagnosis present

## 2021-10-30 DIAGNOSIS — Z85118 Personal history of other malignant neoplasm of bronchus and lung: Secondary | ICD-10-CM | POA: Diagnosis not present

## 2021-10-30 DIAGNOSIS — I4891 Unspecified atrial fibrillation: Secondary | ICD-10-CM | POA: Diagnosis not present

## 2021-10-30 DIAGNOSIS — I480 Paroxysmal atrial fibrillation: Secondary | ICD-10-CM | POA: Diagnosis present

## 2021-10-30 DIAGNOSIS — Z87891 Personal history of nicotine dependence: Secondary | ICD-10-CM | POA: Diagnosis not present

## 2021-10-30 DIAGNOSIS — K7031 Alcoholic cirrhosis of liver with ascites: Secondary | ICD-10-CM | POA: Diagnosis present

## 2021-10-30 DIAGNOSIS — Z902 Acquired absence of lung [part of]: Secondary | ICD-10-CM | POA: Diagnosis not present

## 2021-10-30 DIAGNOSIS — Z79890 Hormone replacement therapy (postmenopausal): Secondary | ICD-10-CM | POA: Diagnosis not present

## 2021-10-30 LAB — ECHOCARDIOGRAM COMPLETE
Area-P 1/2: 3.85 cm2
Height: 63 in
S' Lateral: 2.1 cm
Weight: 2017.65 oz

## 2021-10-30 LAB — BLOOD GAS, VENOUS
Acid-base deficit: 1.5 mmol/L (ref 0.0–2.0)
Bicarbonate: 22.9 mmol/L (ref 20.0–28.0)
Drawn by: 1444
O2 Saturation: 33.5 %
Patient temperature: 36.3
pCO2, Ven: 36 mmHg — ABNORMAL LOW (ref 44–60)
pH, Ven: 7.41 (ref 7.25–7.43)
pO2, Ven: <31 mmHg — CL (ref 32–45)

## 2021-10-30 LAB — CBC
HCT: 36.2 % (ref 36.0–46.0)
Hemoglobin: 11.7 g/dL — ABNORMAL LOW (ref 12.0–15.0)
MCH: 34.5 pg — ABNORMAL HIGH (ref 26.0–34.0)
MCHC: 32.3 g/dL (ref 30.0–36.0)
MCV: 106.8 fL — ABNORMAL HIGH (ref 80.0–100.0)
Platelets: 234 10*3/uL (ref 150–400)
RBC: 3.39 MIL/uL — ABNORMAL LOW (ref 3.87–5.11)
RDW: 15.5 % (ref 11.5–15.5)
WBC: 3.3 10*3/uL — ABNORMAL LOW (ref 4.0–10.5)
nRBC: 0 % (ref 0.0–0.2)

## 2021-10-30 LAB — MRSA NEXT GEN BY PCR, NASAL: MRSA by PCR Next Gen: NOT DETECTED

## 2021-10-30 LAB — PROCALCITONIN: Procalcitonin: <0.1 ng/mL

## 2021-10-30 LAB — TROPONIN I (HIGH SENSITIVITY): Troponin I (High Sensitivity): 9 ng/L

## 2021-10-30 MED ORDER — DILTIAZEM HCL 60 MG PO TABS
60.0000 mg | ORAL_TABLET | Freq: Four times a day (QID) | ORAL | Status: DC
Start: 2021-10-30 — End: 2021-11-01
  Administered 2021-10-30 – 2021-11-01 (×8): 60 mg via ORAL
  Filled 2021-10-30 (×8): qty 1

## 2021-10-30 MED ORDER — ATORVASTATIN CALCIUM 10 MG PO TABS
10.0000 mg | ORAL_TABLET | Freq: Every day | ORAL | Status: DC
Start: 2021-10-30 — End: 2021-11-01
  Administered 2021-10-30 – 2021-10-31 (×2): 10 mg via ORAL
  Filled 2021-10-30 (×2): qty 1

## 2021-10-30 MED ORDER — GUAIFENESIN-DM 100-10 MG/5ML PO SYRP
5.0000 mL | ORAL_SOLUTION | ORAL | Status: DC | PRN
  Administered 2021-10-30 – 2021-10-31 (×2): 5 mL via ORAL
  Filled 2021-10-30 (×2): qty 5

## 2021-10-30 MED ORDER — PERFLUTREN LIPID MICROSPHERE
1.0000 mL | INTRAVENOUS | Status: AC | PRN
  Administered 2021-10-30: 2 mL via INTRAVENOUS
  Filled 2021-10-30: qty 10

## 2021-10-30 MED ORDER — FUROSEMIDE 10 MG/ML IJ SOLN
20.0000 mg | Freq: Once | INTRAMUSCULAR | Status: AC
  Administered 2021-10-30: 20 mg via INTRAVENOUS
  Filled 2021-10-30: qty 2

## 2021-10-30 MED ORDER — ALBUTEROL SULFATE (2.5 MG/3ML) 0.083% IN NEBU
2.5000 mg | INHALATION_SOLUTION | RESPIRATORY_TRACT | Status: DC | PRN
  Administered 2021-10-30: 2.5 mg via RESPIRATORY_TRACT
  Filled 2021-10-30 (×2): qty 3

## 2021-10-30 NOTE — Plan of Care (Signed)
?  Problem: Education: ?Goal: Knowledge of disease or condition will improve ?10/30/2021 2338 by Trellis Moment, RN ?Outcome: Progressing ?10/30/2021 2333 by Trellis Moment, RN ?Outcome: Progressing ?Goal: Understanding of medication regimen will improve ?10/30/2021 2338 by Trellis Moment, RN ?Outcome: Progressing ?10/30/2021 2333 by Trellis Moment, RN ?Outcome: Progressing ?Goal: Individualized Educational Video(s) ?10/30/2021 2338 by Trellis Moment, RN ?Outcome: Progressing ?10/30/2021 2333 by Trellis Moment, RN ?Outcome: Progressing ?  ?Problem: Activity: ?Goal: Ability to tolerate increased activity will improve ?10/30/2021 2338 by Trellis Moment, RN ?Outcome: Progressing ?10/30/2021 2333 by Trellis Moment, RN ?Outcome: Not Progressing ?  ?Problem: Cardiac: ?Goal: Ability to achieve and maintain adequate cardiopulmonary perfusion will improve ?10/30/2021 2338 by Trellis Moment, RN ?Outcome: Progressing ?10/30/2021 2333 by Trellis Moment, RN ?Outcome: Progressing ?  ?Problem: Health Behavior/Discharge Planning: ?Goal: Ability to safely manage health-related needs after discharge will improve ?10/30/2021 2338 by Trellis Moment, RN ?Outcome: Progressing ?10/30/2021 2333 by Trellis Moment, RN ?Outcome: Progressing ?  ?Problem: Education: ?Goal: Knowledge of General Education information will improve ?Description: Including pain rating scale, medication(s)/side effects and non-pharmacologic comfort measures ?Outcome: Progressing ?  ?Problem: Health Behavior/Discharge Planning: ?Goal: Ability to manage health-related needs will improve ?Outcome: Progressing ?  ?Problem: Clinical Measurements: ?Goal: Ability to maintain clinical measurements within normal limits will improve ?Outcome: Progressing ?Goal: Will remain free from infection ?Outcome: Progressing ?Goal: Diagnostic test results will improve ?Outcome: Progressing ?Goal: Cardiovascular complication will be avoided ?Outcome: Progressing ?  ?Problem: Nutrition: ?Goal: Adequate nutrition will be  maintained ?Outcome: Progressing ?  ?Problem: Coping: ?Goal: Level of anxiety will decrease ?Outcome: Progressing ?  ?Problem: Elimination: ?Goal: Will not experience complications related to bowel motility ?Outcome: Progressing ?Goal: Will not experience complications related to urinary retention ?Outcome: Progressing ?  ?Problem: Safety: ?Goal: Ability to remain free from injury will improve ?Outcome: Progressing ?  ?Problem: Skin Integrity: ?Goal: Risk for impaired skin integrity will decrease ?Outcome: Progressing ?  ?Problem: Clinical Measurements: ?Goal: Respiratory complications will improve ?Outcome: Not Progressing ?  ?Problem: Activity: ?Goal: Risk for activity intolerance will decrease ?Outcome: Not Progressing ?  ?Problem: Pain Managment: ?Goal: General experience of comfort will improve ?Outcome: Not Progressing ?  ?

## 2021-10-30 NOTE — Progress Notes (Signed)
Date and time results received: 10/30/21 0450 ?Test: Venous PC02 ?Critical Value: >31 ? ?Name of Provider Notified: Cyd Silence, MD ? ?Awaiting any new orders. ?

## 2021-10-30 NOTE — Care Management Obs Status (Signed)
MEDICARE OBSERVATION STATUS NOTIFICATION ? ? ?Patient Details  ?Name: Sandy [name] ?MRN: [id number] ?Date of Birth: 1955/08/27 ? ? ?Medicare Observation Status Notification Given:  Yes ? ? ? ?Ellington Cornia G., RN ?10/30/2021, 1:31 PM ?

## 2021-10-30 NOTE — Progress Notes (Addendum)
HOSPITAL MEDICINE OVERNIGHT EVENT NOTE   ? ?Notified by nursing that patient is complaining atypical chest discomfort and shortness of breath. ? ?Per my discussion with nursing staff discomfort is squeezing in quality, midsternal and seems to be episodic occurring over the past several weeks.  Pain is not exertional and certainly atypical. ? ?Of note, patient underwent CT angiogram of the chest yesterday which revealed no evidence of pulmonary embolism.  Troponin performed yesterday also was unremarkable. ? ?Concerning patient's ongoing complaints of shortness of breath, there is no associated hypoxia.  Patient does not appear to be in respiratory distress.  It is worth noting that CT imaging yesterday did reveal what seems to be a possible superimposed developing infiltrate, possibly due to developing infection.   ? ?We will obtain chest x-ray, troponin, EKG. ? ?Sandy Emerald  MD ?[facility]  ? ?ADDENDUM (3/11 11:20pm) ? ?EKG reviewed revealing no evidence of arrhythmia or dynamic ST segment change.  Chest x-ray, wall revealing substantial disease process with substantial volume loss of the right lung fields is mostly chronic and unchanged from yesterday. ? ?Nursing reports the patient is still complaining of shortness of breath and vague chest discomfort.  Patient continues to not exhibit any increased oxygen requirement. ? ?I am under the pression that most patients symptoms are actually somewhat chronic although the progressive right upper lobe and hilar consolidation must be considered.  There is also a notable concurrent right-sided pleural effusion as well that must be considered.  We will try a trial of Tylenol, albuterol and 20 mg of intravenous Lasix and monitor for symptomatic response.  In the meantime we will obtain repeat BNP and procalcitonin. ? ?If procalcitonin is markedly elevated will consider antibiotic therapy as this may be due to the progressive infiltrates in the right upper and  hilar lung fields. ? ?Continue to monitor closely. ? ?Sherryll Burger Damien Cisar ? ? ? ? ? ? ? ? ? ? ? ? ?

## 2021-10-30 NOTE — Progress Notes (Signed)
PROGRESS NOTE    Sandy [name]  JJO:[id number] DOB: October 29, 1955 DOA: 10/29/2021 PCP: [name], MD   Brief Narrative:  Sandy [name] is a 66 y.o. female with medical history significant of alcoholic cirrhosis with esophageal varices, history of GI bleed, lung cancer s/p right lobectomy, type 2 diabetes, hypertension hyperlipidemia, and hypothyroidism who presents with concerns of increasing shortness of breath.  Of note patient recently admitted February 2 to 5 for acute upper GI bleed hematemesis status post EGD -notable portal hypertension.  Multiple episodes of A-fib with RVR transient and resolving prior to discharge without intervention or medication changes.  Assessment & Plan:   Principal Problem:   Atrial fibrillation with RVR (HCC) Active Problems:   Type 2 diabetes mellitus (HCC)   Alcoholic cirrhosis (HCC)   History of esophageal varices   S/P lobectomy of lung   Essential hypertension   History of lung cancer   Hypothyroidism   Acute symptomatic atrial fibrillation with RVR (HCC) -RVR resolving on diltiazem drip, transition to p.o. -Holding home nadolol and spironolactone to leave room for rate control -CHA2DS2-VASc elevated but holding anticoagulation in the setting of recent variceal bleeding previously requiring massive transfusion -If BP is not amenable to diltiazem, could consider IV Digoxin 279mcg with repeat in q6hrs per cardiology discussion at intake -Echocardiogram pending  History of lung cancer -S/p Right lumbectomy.   -CTA chest today showed increasing consolidation to the right apex suspicious of infectious versus neoplasm.  -Procalcitonin negative, less likely infectious process -Follows with Dr. Julien Nordmann outpatient, continue follow-up as scheduled  Acute hypoxic respiratory failure, POA Confirmed on multiple ABG at intake, PvO2<32 Initially requiring supplemental oxygen -continue to wean back to room air Dimer elevated but CTA  negative for filling defect Respiratory panel negative Procalcitonin negative less likely infectious process given above  Hypothyroidism - Repeat TSH within normal limits.  Continue Synthroid.   Essential hypertension -Holding home BP meds given borderline hypotension on diltiazem drip   Alcoholic cirrhosis (HCC) - Has history of esophageal varices.  Follows with hepatologist at [facility]. - Reports last drink and new year and perhaps only has about 4 drinks per year   Type 2 diabetes mellitus ([place]) - Last hemoglobin A1c of 4.3 in 2/2  DVT prophylaxis: SCDs, holding chemical prophylaxis given recent bleeding and known varices Code Status: Full Family Communication: None present  Status is: Inpatient  Dispo: The patient is from: Home              Anticipated d/c is to: Home              Anticipated d/c date is: 24 to 48 hours              Patient currently not medically stable for discharge  Consultants:  None  Procedures:  None  Antimicrobials:  None  Subjective: No acute issues or events overnight  Objective: Vitals:   10/30/21 0000 10/30/21 0023 10/30/21 0056 10/30/21 0415  BP: 104/61 99/61 123/66 107/66  Pulse: (!) 117 (!) 112 96 89  Resp: (!) 23 (!) 24 20 16   Temp:   (!) 97.4 F (36.3 C) (!) 97.4 F (36.3 C)  TempSrc:   Oral Oral  SpO2: 100% 100% 99% 99%  Weight:   57.2 kg   Height:   5\' 3"  (1.6 m)     Intake/Output Summary (Last 24 hours) at 10/30/2021 0720 Last data filed at 10/30/2021 0643 Gross per 24 hour  Intake 1056.35 ml  Output --  Net 1056.35 ml   Filed Weights   10/30/21 0056  Weight: 57.2 kg    Examination:  General:  Pleasantly resting in bed, No acute distress. Lungs:  Clear to auscultate bilaterally without rhonchi, wheeze, or rales. Heart: Irregularly irregular.  Without murmurs, rubs, or gallops. Extremities: Without cyanosis, clubbing, edema, or obvious deformity.   Data Reviewed: I have personally reviewed following  labs and imaging studies  CBC: Recent Labs  Lab 10/29/21 1611 10/29/21 2224 10/30/21 0407  WBC 4.0  --  3.3*  NEUTROABS 2.7  --   --   HGB 12.1 13.6 11.7*  HCT 37.7 40.0 36.2  MCV 107.4*  --  106.8*  PLT 216  --  342   Basic Metabolic Panel: Recent Labs  Lab 10/29/21 1611 10/29/21 2224  NA 135 136  K 3.8 3.6  CL 100  --   CO2 26  --   GLUCOSE 120*  --   BUN 14  --   CREATININE 0.72  --   CALCIUM 8.7*  --    GFR: Estimated Creatinine Clearance: 58 mL/min (by C-G formula based on SCr of 0.72 mg/dL). Liver Function Tests: Recent Labs  Lab 10/29/21 1611  AST 42*  ALT 22  ALKPHOS 121  BILITOT 4.6*  PROT 6.8  ALBUMIN 2.4*   No results for input(s): LIPASE, AMYLASE in the last 168 hours. No results for input(s): AMMONIA in the last 168 hours. Coagulation Profile: No results for input(s): INR, PROTIME in the last 168 hours. Cardiac Enzymes: No results for input(s): CKTOTAL, CKMB, CKMBINDEX, TROPONINI in the last 168 hours. BNP (last 3 results) No results for input(s): PROBNP in the last 8760 hours. HbA1C: No results for input(s): HGBA1C in the last 72 hours. CBG: No results for input(s): GLUCAP in the last 168 hours. Lipid Profile: No results for input(s): CHOL, HDL, LDLCALC, TRIG, CHOLHDL, LDLDIRECT in the last 72 hours. Thyroid Function Tests: Recent Labs    10/29/21 2202  TSH 3.695   Anemia Panel: No results for input(s): VITAMINB12, FOLATE, FERRITIN, TIBC, IRON, RETICCTPCT in the last 72 hours. Sepsis Labs: No results for input(s): PROCALCITON, LATICACIDVEN in the last 168 hours.  Recent Results (from the past 240 hour(s))  Resp Panel by RT-PCR (Flu A&B, Covid) Nasopharyngeal Swab     Status: None   Collection Time: 10/29/21 10:13 PM   Specimen: Nasopharyngeal Swab; Nasopharyngeal(NP) swabs in vial transport medium  Result Value Ref Range Status   SARS Coronavirus 2 by RT PCR NEGATIVE NEGATIVE Final    Comment: (NOTE) SARS-CoV-2 target nucleic  acids are NOT DETECTED.  The SARS-CoV-2 RNA is generally detectable in upper respiratory specimens during the acute phase of infection. The lowest concentration of SARS-CoV-2 viral copies this assay can detect is 138 copies/mL. A negative result does not preclude SARS-Cov-2 infection and should not be used as the sole basis for treatment or other patient management decisions. A negative result may occur with  improper specimen collection/handling, submission of specimen other than nasopharyngeal swab, presence of viral mutation(s) within the areas targeted by this assay, and inadequate number of viral copies(<138 copies/mL). A negative result must be combined with clinical observations, patient history, and epidemiological information. The expected result is Negative.  Fact Sheet for Patients:  EntrepreneurPulse.com.au  Fact Sheet for Healthcare Providers:  IncredibleEmployment.be  This test is no t yet approved or cleared by the [place] FDA and  has been authorized for detection and/or diagnosis of SARS-CoV-2 by FDA  under an Emergency Use Authorization (EUA). This EUA will remain  in effect (meaning this test can be used) for the duration of the COVID-19 declaration under Section 564(b)(1) of the Act, 21 U.S.C.section 360bbb-3(b)(1), unless the authorization is terminated  or revoked sooner.       Influenza A by PCR NEGATIVE NEGATIVE Final   Influenza B by PCR NEGATIVE NEGATIVE Final    Comment: (NOTE) The Xpert Xpress SARS-CoV-2/FLU/RSV plus assay is intended as an aid in the diagnosis of influenza from Nasopharyngeal swab specimens and should not be used as a sole basis for treatment. Nasal washings and aspirates are unacceptable for Xpert Xpress SARS-CoV-2/FLU/RSV testing.  Fact Sheet for Patients: EntrepreneurPulse.com.au  Fact Sheet for Healthcare Providers: IncredibleEmployment.be  This  test is not yet approved or cleared by the [place] FDA and has been authorized for detection and/or diagnosis of SARS-CoV-2 by FDA under an Emergency Use Authorization (EUA). This EUA will remain in effect (meaning this test can be used) for the duration of the COVID-19 declaration under Section 564(b)(1) of the Act, 21 U.S.C. section 360bbb-3(b)(1), unless the authorization is terminated or revoked.  Performed at [facility], [place] [address], [place], [place] [address]   MRSA Next Gen by PCR, Nasal     Status: None   Collection Time: 10/30/21 12:59 AM   Specimen: Nasal Mucosa; Nasal Swab  Result Value Ref Range Status   MRSA by PCR Next Gen NOT DETECTED NOT DETECTED Final    Comment: (NOTE) The GeneXpert MRSA Assay (FDA approved for NASAL specimens only), is one component of a comprehensive MRSA colonization surveillance program. It is not intended to diagnose MRSA infection nor to guide or monitor treatment for MRSA infections. Test performance is not FDA approved in patients less than 33 years old. Performed at [facility], [place] [address] [place], [place] [address]          Radiology Studies: CT Angio Chest PE W and/or Wo Contrast  Result Date: 10/29/2021 CLINICAL DATA:  Shortness of breath EXAM: CT ANGIOGRAPHY CHEST WITH CONTRAST TECHNIQUE: Multidetector CT imaging of the chest was performed using the standard protocol during bolus administration of intravenous contrast. Multiplanar CT image reconstructions and MIPs were obtained to evaluate the vascular anatomy. RADIATION DOSE REDUCTION: This exam was performed according to the departmental dose-optimization program which includes automated exposure control, adjustment of the mA and/or kV according to patient size and/or use of iterative reconstruction technique. CONTRAST:  69mL OMNIPAQUE IOHEXOL 350 MG/ML SOLN COMPARISON:  Chest x-ray 10/29/2021, chest CT 10/23/2018, 05/21/2019, 08/26/2019, 01/06/2020,  06/12/2020, 01/01/2021 FINDINGS: Cardiovascular: Satisfactory opacification of the pulmonary arteries to the segmental level. No evidence of pulmonary embolism. Mild aortic atherosclerosis. No aneurysm or dissection. Coronary vascular calcification. Borderline to mild cardiomegaly. Small pericardial effusion which is increased compared to prior. Mediastinum/Nodes: Midline trachea. No thyroid mass. Esophagus within normal limits. No increasing adenopathy. Lungs/Pleura: Volume loss right thorax with shift of mediastinal contents to the right consistent with history of prior partial lung resection. Slowly progressive consolidation most notable at the right apex. Underlying emphysema. Increased right pleural effusion. Upper Abdomen: Increased ascites within the upper abdomen. Cirrhotic morphology of the liver. Partially visualized right renal cysts. Spleen is incompletely visualized but appears enlarged. Musculoskeletal: No acute osseous abnormality Review of the MIP images confirms the above findings. IMPRESSION: 1. Negative for acute pulmonary embolus. 2. Volume loss in the right thorax with shift of mediastinal contents to the right consistent with history of  prior lung surgery. Architectural distortion and soft tissue density in the right upper lobe and hilar region some of which is felt secondary to post therapy change, however there has been slowly progressive consolidation most notable at the right apex. Findings could be due to superimposed infection though neoplasm is also a concern. Pulmonary follow-up with possible PET CT follow-up is suggested. Overall there is increased right pleural effusion compared to the previous exams. 3. Liver cirrhosis and splenomegaly. Increased ascites within the upper abdomen 4. Small pericardial effusion which is also increased compared to prior. Aortic Atherosclerosis (ICD10-I70.0) and Emphysema (ICD10-J43.9). Electronically Signed   By: Donavan Foil M.D.   On: 10/29/2021 18:46    DG Chest Port 1 View  Result Date: 10/29/2021 CLINICAL DATA:  Shortness of breath EXAM: PORTABLE CHEST 1 VIEW COMPARISON:  09/23/2021, 01/01/2021, chest CT 01/01/2021, 06/12/2020. FINDINGS: Volume loss and mediastinal shift to the right. Architectural distortion, parenchymal consolidation and pleural thickening or effusion without significant change since 09/23/2021. There may be slight increased pleural effusion or thickening compared to more remote examination from 2021. Left lung is grossly clear. IMPRESSION: 1. Volume loss and postsurgical changes in the right thorax with consolidation and distortion in the right hilar region as before. 2. Circumferential pleural thickening stable with most recent priors, may be slightly increased compared to more remote exams potentially due to presence of increased pleural effusion or possible pleural disease. Follow-up chest CT may be helpful to further assess. Electronically Signed   By: Donavan Foil M.D.   On: 10/29/2021 16:17   VAS [place] LOWER EXTREMITY VENOUS (DVT) (7a-7p)  Result Date: 10/29/2021  Lower Venous DVT Study Patient Name:  Sandy [name]  Date of Exam:   10/29/2021 Medical Rec #: [id number]        Accession #:    [id number] Date of Birth: 03/27/1956        Patient Gender: F Patient Age:   38 years Exam Location:  [facility] Procedure:      VAS [place] LOWER EXTREMITY VENOUS (DVT) Referring Phys: JOSHUA HONG --------------------------------------------------------------------------------  Indications: Swelling, and SOB.  Comparison Study: no prior studies Performing Technologist: Darlin Coco RDMS, RVT  Examination Guidelines: A complete evaluation includes B-mode imaging, spectral Doppler, color Doppler, and power Doppler as needed of all accessible portions of each vessel. Bilateral testing is considered an integral part of a complete examination. Limited examinations for reoccurring indications may be performed as noted. The reflux portion of the  exam is performed with the patient in reverse Trendelenburg.  +-----+---------------+---------+-----------+----------+--------------+  RIGHT Compressibility Phasicity Spontaneity Properties Thrombus Aging  +-----+---------------+---------+-----------+----------+--------------+  CFV   Full            Yes       Yes                                    +-----+---------------+---------+-----------+----------+--------------+   +---------+---------------+---------+-----------+----------+-------------------+  LEFT      Compressibility Phasicity Spontaneity Properties Thrombus Aging       +---------+---------------+---------+-----------+----------+-------------------+  CFV                       Yes       Yes                                         +---------+---------------+---------+-----------+----------+-------------------+  SFJ                       Yes       Yes                                         +---------+---------------+---------+-----------+----------+-------------------+  FV Prox   Full                                                                  +---------+---------------+---------+-----------+----------+-------------------+  FV Mid    Full                                                                  +---------+---------------+---------+-----------+----------+-------------------+  FV Distal Full                                                                  +---------+---------------+---------+-----------+----------+-------------------+  PFV       Full                                                                  +---------+---------------+---------+-----------+----------+-------------------+  POP       Full                                                                  +---------+---------------+---------+-----------+----------+-------------------+  Soleal    Full                                             notably dilated                                                                  Soleal  Vein  observed during                                                                  Peroneal evalution   +---------+---------------+---------+-----------+----------+-------------------+  Gastroc   Full                                                                  +---------+---------------+---------+-----------+----------+-------------------+     Summary: RIGHT: - No evidence of common femoral vein obstruction.  LEFT: - There is no evidence of deep vein thrombosis in the lower extremity. Notably dilated soleal vein without evidence of obstruction.  - No cystic structure found in the popliteal fossa.  *See table(s) above for measurements and observations. Electronically signed by Monica Martinez MD on 10/29/2021 at 5:23:08 PM.    Final     Scheduled Meds:  levothyroxine  50 mcg Oral QAC breakfast   pantoprazole  40 mg Oral BID   Continuous Infusions:  diltiazem (CARDIZEM) infusion 5 mg/hr (10/30/21 0056)     LOS: 0 days   Time spent: 68min  Athalie Newhard C Kahmari Herard, DO [facility]  If 7PM-7AM, please contact night-coverage [email or web address]  10/30/2021, 7:20 AM

## 2021-10-31 LAB — CBC
HCT: 32.6 % — ABNORMAL LOW (ref 36.0–46.0)
Hemoglobin: 10.7 g/dL — ABNORMAL LOW (ref 12.0–15.0)
MCH: 34.5 pg — ABNORMAL HIGH (ref 26.0–34.0)
MCHC: 32.8 g/dL (ref 30.0–36.0)
MCV: 105.2 fL — ABNORMAL HIGH (ref 80.0–100.0)
Platelets: 289 10*3/uL (ref 150–400)
RBC: 3.1 MIL/uL — ABNORMAL LOW (ref 3.87–5.11)
RDW: 15.4 % (ref 11.5–15.5)
WBC: 9.1 10*3/uL (ref 4.0–10.5)
nRBC: 0 % (ref 0.0–0.2)

## 2021-10-31 LAB — BASIC METABOLIC PANEL
Anion gap: 9 (ref 5–15)
BUN: 21 mg/dL (ref 8–23)
CO2: 21 mmol/L — ABNORMAL LOW (ref 22–32)
Calcium: 8.7 mg/dL — ABNORMAL LOW (ref 8.9–10.3)
Chloride: 101 mmol/L (ref 98–111)
Creatinine, Ser: 0.98 mg/dL (ref 0.44–1.00)
GFR, Estimated: >60 mL/min (ref >60)
Glucose, Bld: 179 mg/dL — ABNORMAL HIGH (ref 70–99)
Potassium: 4.1 mmol/L (ref 3.5–5.1)
Sodium: 131 mmol/L — ABNORMAL LOW (ref 135–145)

## 2021-10-31 LAB — PROCALCITONIN: Procalcitonin: <0.1 ng/mL

## 2021-10-31 LAB — BRAIN NATRIURETIC PEPTIDE: B Natriuretic Peptide: 270 pg/mL — ABNORMAL HIGH (ref 0.0–100.0)

## 2021-10-31 MED ORDER — ONDANSETRON HCL 4 MG/2ML IJ SOLN
4.0000 mg | Freq: Once | INTRAMUSCULAR | Status: AC
Start: 2021-10-31 — End: 2021-10-31
  Administered 2021-10-31: 4 mg via INTRAVENOUS
  Filled 2021-10-31: qty 2

## 2021-10-31 MED ORDER — ONDANSETRON HCL 4 MG/2ML IJ SOLN
4.0000 mg | Freq: Four times a day (QID) | INTRAMUSCULAR | Status: DC | PRN
  Administered 2021-10-31: 4 mg via INTRAVENOUS
  Filled 2021-10-31: qty 2

## 2021-10-31 MED ORDER — MORPHINE SULFATE (PF) 2 MG/ML IV SOLN
1.0000 mg | INTRAVENOUS | Status: DC | PRN
  Administered 2021-10-31 (×2): 1 mg via INTRAVENOUS
  Filled 2021-10-31 (×2): qty 1

## 2021-10-31 MED ORDER — ONDANSETRON HCL 4 MG PO TABS
4.0000 mg | ORAL_TABLET | Freq: Four times a day (QID) | ORAL | Status: DC | PRN
Start: 2021-10-31 — End: 2021-11-01
  Administered 2021-10-31: 4 mg via ORAL

## 2021-10-31 MED ORDER — ONDANSETRON 4 MG PO TBDP
ORAL_TABLET | ORAL | Status: AC
Start: 2021-10-31 — End: 2021-10-31
  Filled 2021-10-31: qty 1

## 2021-10-31 MED ORDER — PREDNISONE 20 MG PO TABS
40.0000 mg | ORAL_TABLET | Freq: Every day | ORAL | Status: DC
Start: 2021-11-01 — End: 2021-11-01
  Administered 2021-11-01: 40 mg via ORAL
  Filled 2021-10-31: qty 2

## 2021-10-31 MED ORDER — METHYLPREDNISOLONE SODIUM SUCC 125 MG IJ SOLR
125.0000 mg | Freq: Once | INTRAMUSCULAR | Status: AC
  Administered 2021-10-31: 125 mg via INTRAVENOUS
  Filled 2021-10-31: qty 2

## 2021-10-31 NOTE — Plan of Care (Signed)
?  Problem: Education: ?Goal: Knowledge of disease or condition will improve ?Outcome: Progressing ?Goal: Understanding of medication regimen will improve ?Outcome: Progressing ?Goal: Individualized Educational Video(s) ?Outcome: Progressing ?  ?Problem: Activity: ?Goal: Ability to tolerate increased activity will improve ?Outcome: Progressing ?  ?Problem: Cardiac: ?Goal: Ability to achieve and maintain adequate cardiopulmonary perfusion will improve ?Outcome: Progressing ?  ?Problem: Health Behavior/Discharge Planning: ?Goal: Ability to safely manage health-related needs after discharge will improve ?Outcome: Progressing ?  ?Problem: Education: ?Goal: Knowledge of General Education information will improve ?Description: Including pain rating scale, medication(s)/side effects and non-pharmacologic comfort measures ?Outcome: Progressing ?  ?Problem: Health Behavior/Discharge Planning: ?Goal: Ability to manage health-related needs will improve ?Outcome: Progressing ?  ?Problem: Clinical Measurements: ?Goal: Ability to maintain clinical measurements within normal limits will improve ?Outcome: Progressing ?Goal: Will remain free from infection ?Outcome: Progressing ?Goal: Diagnostic test results will improve ?Outcome: Progressing ?Goal: Respiratory complications will improve ?Outcome: Progressing ?Goal: Cardiovascular complication will be avoided ?Outcome: Progressing ?  ?Problem: Clinical Measurements: ?Goal: Ability to maintain clinical measurements within normal limits will improve ?Outcome: Progressing ?Goal: Will remain free from infection ?Outcome: Progressing ?Goal: Diagnostic test results will improve ?Outcome: Progressing ?Goal: Respiratory complications will improve ?Outcome: Progressing ?Goal: Cardiovascular complication will be avoided ?Outcome: Progressing ?  ?Problem: Activity: ?Goal: Risk for activity intolerance will decrease ?Outcome: Progressing ?  ?Problem: Nutrition: ?Goal: Adequate nutrition will be  maintained ?Outcome: Progressing ?  ?Problem: Coping: ?Goal: Level of anxiety will decrease ?Outcome: Progressing ?  ?Problem: Elimination: ?Goal: Will not experience complications related to bowel motility ?Outcome: Progressing ?Goal: Will not experience complications related to urinary retention ?Outcome: Progressing ?  ?Problem: Pain Managment: ?Goal: General experience of comfort will improve ?Outcome: Progressing ?  ?Problem: Safety: ?Goal: Ability to remain free from injury will improve ?Outcome: Progressing ?  ?Problem: Skin Integrity: ?Goal: Risk for impaired skin integrity will decrease ?Outcome: Progressing ?  ?

## 2021-10-31 NOTE — Progress Notes (Signed)
PROGRESS NOTE    Sandy [name]  YYT:[id number] DOB: 03-17-1956 DOA: 10/29/2021 PCP: [name], MD   Brief Narrative:  Sandy [name] is a 66 y.o. female with medical history significant of alcoholic cirrhosis with esophageal varices, history of GI bleed, lung cancer s/p right lobectomy, type 2 diabetes, hypertension hyperlipidemia, and hypothyroidism who presents with concerns of increasing shortness of breath.  Of note patient recently admitted February 2 to 5 for acute upper GI bleed hematemesis status post EGD -notable portal hypertension.  Multiple episodes of A-fib with RVR transient and resolving prior to discharge without intervention or medication changes.  Assessment & Plan:   Principal Problem:   Atrial fibrillation with RVR (HCC) Active Problems:   Type 2 diabetes mellitus (HCC)   Alcoholic cirrhosis (HCC)   History of esophageal varices   S/P lobectomy of lung   Essential hypertension   History of lung cancer   Hypothyroidism   Acute symptomatic atrial fibrillation with RVR (HCC) -RVR resolving on diltiazem drip, transition to p.o. -Holding home nadolol and spironolactone to leave room for rate control -CHA2DS2-VASc elevated but holding anticoagulation in the setting of recent variceal bleeding previously requiring massive transfusion -If BP is not amenable to diltiazem, could consider IV Digoxin 24mcg with repeat in q6hrs per cardiology discussion at intake -Echocardiogram pending  History of lung cancer Dyspnea, chronic Pleuritic chest pain, severe, intractable -S/p Right lumbectomy.   -CTA grossly unchanged from 2021 per personal review -Procalcitonin negative, less likely infectious process -Follows with Dr. Julien Nordmann outpatient, continue follow-up as scheduled -Discussed case with pulmonology, no indication for intervention or procedure at this time, will initiate high-dose steroids, tentative plan for close follow-up with outpatient  pulmonology -Start low-dose narcotics with morphine, follow clinically  Acute hypoxic respiratory failure, POA Secondary to splinting Confirmed on multiple ABG at intake, PvO2<32 Initially requiring supplemental oxygen -continue to wean back to room air Dimer elevated but CTA negative for filling defect Respiratory panel negative Procalcitonin negative less likely infectious process given above Oxygen remains borderline in the setting of splinting due to above pleuritic chest pain  Hypothyroidism - Repeat TSH within normal limits.  Continue Synthroid.   Essential hypertension -Holding home BP meds given borderline hypotension on diltiazem drip   Alcoholic cirrhosis (HCC) - Has history of esophageal varices.  Follows with hepatologist at [facility]. - Reports last drink and new year and perhaps only has about 4 drinks per year   Type 2 diabetes mellitus ([place]) - Last hemoglobin A1c of 4.3 in 2/2  DVT prophylaxis: SCDs, holding chemical prophylaxis given recent bleeding and known varices Code Status: Full Family Communication: None present  Status is: Inpatient  Dispo: The patient is from: Home              Anticipated d/c is to: Home              Anticipated d/c date is: 24 to 48 hours              Patient currently not medically stable for discharge  Consultants:  None  Procedures:  None  Antimicrobials:  None  Subjective: Continues complain of profound right anterior chest wall pain, pleuritic pain, dyspnea, and difficulty taking a deep breath limited by pain.  Objective: Vitals:   10/30/21 1803 10/30/21 1900 10/30/21 2353 10/31/21 0450  BP: 121/64 119/63 125/66 (!) 107/55  Pulse: 84 83 77 71  Resp: 18 (!) 21 20 20   Temp:  (!) 97.4 F (36.3  C) (!) 97.5 F (36.4 C) (!) 97.5 F (36.4 C)  TempSrc:  Oral Oral Oral  SpO2: 100% 100% 97% 99%  Weight:      Height:        Intake/Output Summary (Last 24 hours) at 10/31/2021 0730 Last data filed at 10/30/2021  1800 Gross per 24 hour  Intake 511.75 ml  Output 4 ml  Net 507.75 ml    Filed Weights   10/30/21 0056  Weight: 57.2 kg    Examination:  General:  Pleasantly resting in bed, No acute distress. Lungs:  Clear to auscultate bilaterally without rhonchi, wheeze, or rales.  Nontender to palpation Heart: Irregularly irregular.  Without murmurs, rubs, or gallops. Extremities: Without cyanosis, clubbing, edema, or obvious deformity.   Data Reviewed: I have personally reviewed following labs and imaging studies  CBC: Recent Labs  Lab 10/29/21 1611 10/29/21 2224 10/30/21 0407 10/30/21 2342  WBC 4.0  --  3.3* 9.1  NEUTROABS 2.7  --   --   --   HGB 12.1 13.6 11.7* 10.7*  HCT 37.7 40.0 36.2 32.6*  MCV 107.4*  --  106.8* 105.2*  PLT 216  --  234 416    Basic Metabolic Panel: Recent Labs  Lab 10/29/21 1611 10/29/21 2224 10/30/21 2342  NA 135 136 131*  K 3.8 3.6 4.1  CL 100  --  101  CO2 26  --  21*  GLUCOSE 120*  --  179*  BUN 14  --  21  CREATININE 0.72  --  0.98  CALCIUM 8.7*  --  8.7*    GFR: Estimated Creatinine Clearance: 47.3 mL/min (by C-G formula based on SCr of 0.98 mg/dL). Liver Function Tests: Recent Labs  Lab 10/29/21 1611  AST 42*  ALT 22  ALKPHOS 121  BILITOT 4.6*  PROT 6.8  ALBUMIN 2.4*    No results for input(s): LIPASE, AMYLASE in the last 168 hours. No results for input(s): AMMONIA in the last 168 hours. Coagulation Profile: No results for input(s): INR, PROTIME in the last 168 hours. Cardiac Enzymes: No results for input(s): CKTOTAL, CKMB, CKMBINDEX, TROPONINI in the last 168 hours. BNP (last 3 results) No results for input(s): PROBNP in the last 8760 hours. HbA1C: No results for input(s): HGBA1C in the last 72 hours. CBG: No results for input(s): GLUCAP in the last 168 hours. Lipid Profile: No results for input(s): CHOL, HDL, LDLCALC, TRIG, CHOLHDL, LDLDIRECT in the last 72 hours. Thyroid Function Tests: Recent Labs     10/29/21 2202  TSH 3.695    Anemia Panel: No results for input(s): VITAMINB12, FOLATE, FERRITIN, TIBC, IRON, RETICCTPCT in the last 72 hours. Sepsis Labs: Recent Labs  Lab 10/30/21 0724 10/30/21 2342  PROCALCITON <0.10 <0.10    Recent Results (from the past 240 hour(s))  Resp Panel by RT-PCR (Flu A&B, Covid) Nasopharyngeal Swab     Status: None   Collection Time: 10/29/21 10:13 PM   Specimen: Nasopharyngeal Swab; Nasopharyngeal(NP) swabs in vial transport medium  Result Value Ref Range Status   SARS Coronavirus 2 by RT PCR NEGATIVE NEGATIVE Final    Comment: (NOTE) SARS-CoV-2 target nucleic acids are NOT DETECTED.  The SARS-CoV-2 RNA is generally detectable in upper respiratory specimens during the acute phase of infection. The lowest concentration of SARS-CoV-2 viral copies this assay can detect is 138 copies/mL. A negative result does not preclude SARS-Cov-2 infection and should not be used as the sole basis for treatment or other patient management decisions. A negative result  may occur with  improper specimen collection/handling, submission of specimen other than nasopharyngeal swab, presence of viral mutation(s) within the areas targeted by this assay, and inadequate number of viral copies(<138 copies/mL). A negative result must be combined with clinical observations, patient history, and epidemiological information. The expected result is Negative.  Fact Sheet for Patients:  EntrepreneurPulse.com.au  Fact Sheet for Healthcare Providers:  IncredibleEmployment.be  This test is no t yet approved or cleared by the [place] FDA and  has been authorized for detection and/or diagnosis of SARS-CoV-2 by FDA under an Emergency Use Authorization (EUA). This EUA will remain  in effect (meaning this test can be used) for the duration of the COVID-19 declaration under Section 564(b)(1) of the Act, 21 U.S.C.section 360bbb-3(b)(1), unless  the authorization is terminated  or revoked sooner.       Influenza A by PCR NEGATIVE NEGATIVE Final   Influenza B by PCR NEGATIVE NEGATIVE Final    Comment: (NOTE) The Xpert Xpress SARS-CoV-2/FLU/RSV plus assay is intended as an aid in the diagnosis of influenza from Nasopharyngeal swab specimens and should not be used as a sole basis for treatment. Nasal washings and aspirates are unacceptable for Xpert Xpress SARS-CoV-2/FLU/RSV testing.  Fact Sheet for Patients: EntrepreneurPulse.com.au  Fact Sheet for Healthcare Providers: IncredibleEmployment.be  This test is not yet approved or cleared by the [place] FDA and has been authorized for detection and/or diagnosis of SARS-CoV-2 by FDA under an Emergency Use Authorization (EUA). This EUA will remain in effect (meaning this test can be used) for the duration of the COVID-19 declaration under Section 564(b)(1) of the Act, 21 U.S.C. section 360bbb-3(b)(1), unless the authorization is terminated or revoked.  Performed at [facility], [place] [address], [place], [place] [address]   MRSA Next Gen by PCR, Nasal     Status: None   Collection Time: 10/30/21 12:59 AM   Specimen: Nasal Mucosa; Nasal Swab  Result Value Ref Range Status   MRSA by PCR Next Gen NOT DETECTED NOT DETECTED Final    Comment: (NOTE) The GeneXpert MRSA Assay (FDA approved for NASAL specimens only), is one component of a comprehensive MRSA colonization surveillance program. It is not intended to diagnose MRSA infection nor to guide or monitor treatment for MRSA infections. Test performance is not FDA approved in patients less than 96 years old. Performed at [facility], [place] [address], [place], [place] [address]      Radiology Studies: DG Chest 1 View  Result Date: 10/30/2021 CLINICAL DATA:  Shortness of breath. EXAM: CHEST  1 VIEW COMPARISON:  Chest radiograph dated 10/29/2021 and CT dated 10/29/2021  FINDINGS: Similar appearance of the right lung with volume loss and architectural distortion and small effusion. The left lung remains clear. No pneumothorax. There is shift of the mediastinum to the right similar to prior radiograph. Stable cardiomediastinal silhouette. No acute osseous pathology. IMPRESSION: No interval change. Electronically Signed   By: Anner Crete M.D.   On: 10/30/2021 21:45   CT Angio Chest PE W and/or Wo Contrast  Result Date: 10/29/2021 CLINICAL DATA:  Shortness of breath EXAM: CT ANGIOGRAPHY CHEST WITH CONTRAST TECHNIQUE: Multidetector CT imaging of the chest was performed using the standard protocol during bolus administration of intravenous contrast. Multiplanar CT image reconstructions and MIPs were obtained to evaluate the vascular anatomy. RADIATION DOSE REDUCTION: This exam was performed according to the departmental dose-optimization program which includes automated exposure control, adjustment of the mA and/or kV according to patient  size and/or use of iterative reconstruction technique. CONTRAST:  69mL OMNIPAQUE IOHEXOL 350 MG/ML SOLN COMPARISON:  Chest x-ray 10/29/2021, chest CT 10/23/2018, 05/21/2019, 08/26/2019, 01/06/2020, 06/12/2020, 01/01/2021 FINDINGS: Cardiovascular: Satisfactory opacification of the pulmonary arteries to the segmental level. No evidence of pulmonary embolism. Mild aortic atherosclerosis. No aneurysm or dissection. Coronary vascular calcification. Borderline to mild cardiomegaly. Small pericardial effusion which is increased compared to prior. Mediastinum/Nodes: Midline trachea. No thyroid mass. Esophagus within normal limits. No increasing adenopathy. Lungs/Pleura: Volume loss right thorax with shift of mediastinal contents to the right consistent with history of prior partial lung resection. Slowly progressive consolidation most notable at the right apex. Underlying emphysema. Increased right pleural effusion. Upper Abdomen: Increased ascites  within the upper abdomen. Cirrhotic morphology of the liver. Partially visualized right renal cysts. Spleen is incompletely visualized but appears enlarged. Musculoskeletal: No acute osseous abnormality Review of the MIP images confirms the above findings. IMPRESSION: 1. Negative for acute pulmonary embolus. 2. Volume loss in the right thorax with shift of mediastinal contents to the right consistent with history of prior lung surgery. Architectural distortion and soft tissue density in the right upper lobe and hilar region some of which is felt secondary to post therapy change, however there has been slowly progressive consolidation most notable at the right apex. Findings could be due to superimposed infection though neoplasm is also a concern. Pulmonary follow-up with possible PET CT follow-up is suggested. Overall there is increased right pleural effusion compared to the previous exams. 3. Liver cirrhosis and splenomegaly. Increased ascites within the upper abdomen 4. Small pericardial effusion which is also increased compared to prior. Aortic Atherosclerosis (ICD10-I70.0) and Emphysema (ICD10-J43.9). Electronically Signed   By: Donavan Foil M.D.   On: 10/29/2021 18:46   DG Chest Port 1 View  Result Date: 10/29/2021 CLINICAL DATA:  Shortness of breath EXAM: PORTABLE CHEST 1 VIEW COMPARISON:  09/23/2021, 01/01/2021, chest CT 01/01/2021, 06/12/2020. FINDINGS: Volume loss and mediastinal shift to the right. Architectural distortion, parenchymal consolidation and pleural thickening or effusion without significant change since 09/23/2021. There may be slight increased pleural effusion or thickening compared to more remote examination from 2021. Left lung is grossly clear. IMPRESSION: 1. Volume loss and postsurgical changes in the right thorax with consolidation and distortion in the right hilar region as before. 2. Circumferential pleural thickening stable with most recent priors, may be slightly increased  compared to more remote exams potentially due to presence of increased pleural effusion or possible pleural disease. Follow-up chest CT may be helpful to further assess. Electronically Signed   By: Donavan Foil M.D.   On: 10/29/2021 16:17   ECHOCARDIOGRAM COMPLETE  Result Date: 10/30/2021    ECHOCARDIOGRAM REPORT   Patient Name:   AKESHA URESTI Date of Exam: 10/30/2021 Medical Rec #:  [id number]       Height:       63.0 in Accession #:    [id number]      Weight:       126.1 lb Date of Birth:  08-Nov-1955       BSA:          1.589 m Patient Age:    49 years        BP:           114/63 mmHg Patient Gender: F               HR:           83 bpm. Exam Location:  Inpatient Procedure:  2D Echo, Cardiac Doppler, Color Doppler and Intracardiac            Opacification Agent Indications:    Atrial Fibrillation I48.91  History:        Patient has prior history of Echocardiogram examinations, most                 recent 09/07/2020. COPD; Risk Factors:Hypertension, Diabetes,                 Dyslipidemia and Former Smoker. Hypothyoidism. S/p Right                 lumbectomy.  Sonographer:    Darlina Sicilian RDCS Referring Phys: [id number] [place]  1. Left ventricular ejection fraction, by estimation, is 65 to 70%. The left ventricle has normal function. The left ventricle has no regional wall motion abnormalities. Left ventricular diastolic parameters are indeterminate. Elevated left ventricular end-diastolic pressure.  2. Right ventricular systolic function is normal. The right ventricular size is normal.  3. A small pericardial effusion is present. The pericardial effusion is posterior to the left ventricle. There is no evidence of cardiac tamponade.  4. The mitral valve is normal in structure. Trivial mitral valve regurgitation. No evidence of mitral stenosis. Moderate mitral annular calcification.  5. The aortic valve is tricuspid. There is mild calcification of the aortic valve. Aortic valve regurgitation is  mild to moderate. No aortic stenosis is present.  6. The inferior vena cava is normal in size with <50% respiratory variability, suggesting right atrial pressure of 8 mmHg. Comparison(s): No significant change from prior study. Conclusion(s)/Recommendation(s): Otherwise normal echocardiogram, with minor abnormalities described in the report. FINDINGS  Left Ventricle: Left ventricular ejection fraction, by estimation, is 65 to 70%. The left ventricle has normal function. The left ventricle has no regional wall motion abnormalities. Definity contrast agent was given IV to delineate the left ventricular  endocardial borders. The left ventricular internal cavity size was normal in size. There is no left ventricular hypertrophy. Left ventricular diastolic parameters are indeterminate. Elevated left ventricular end-diastolic pressure. Right Ventricle: The right ventricular size is normal. Right vetricular wall thickness was not well visualized. Right ventricular systolic function is normal. Left Atrium: Left atrial size was normal in size. Right Atrium: Right atrial size was not well visualized. Pericardium: A small pericardial effusion is present. The pericardial effusion is posterior to the left ventricle. There is no evidence of cardiac tamponade. Mitral Valve: The mitral valve is normal in structure. Moderate mitral annular calcification. Trivial mitral valve regurgitation. No evidence of mitral valve stenosis. Tricuspid Valve: The tricuspid valve is normal in structure. Tricuspid valve regurgitation is trivial. No evidence of tricuspid stenosis. Aortic Valve: The aortic valve is tricuspid. There is mild calcification of the aortic valve. Aortic valve regurgitation is mild to moderate. No aortic stenosis is present. Pulmonic Valve: The pulmonic valve was not well visualized. Pulmonic valve regurgitation is not visualized. No evidence of pulmonic stenosis. Aorta: The aortic root, ascending aorta, aortic arch and  descending aorta are all structurally normal, with no evidence of dilitation or obstruction. Venous: The inferior vena cava is normal in size with less than 50% respiratory variability, suggesting right atrial pressure of 8 mmHg. IAS/Shunts: The interatrial septum was not well visualized.  LEFT VENTRICLE PLAX 2D LVIDd:         3.80 cm   Diastology LVIDs:         2.10 cm   LV e' medial:  6.90 cm/s LV PW:         1.00 cm   LV E/e' medial:  17.1 LV IVS:        0.90 cm   LV e' lateral:   9.11 cm/s LVOT diam:     2.10 cm   LV E/e' lateral: 13.0 LVOT Area:     3.46 cm  LEFT ATRIUM         Index LA diam:    3.10 cm 1.95 cm/m   AORTA Ao Root diam: 2.90 cm MITRAL VALVE MV Area (PHT): 3.85 cm     SHUNTS MV Decel Time: 197 msec     Systemic Diam: 2.10 cm MV E velocity: 118.00 cm/s MV A velocity: 137.00 cm/s MV E/A ratio:  0.86 Buford Dresser MD Electronically signed by Buford Dresser MD Signature Date/Time: 10/30/2021/1:11:02 PM    Final    VAS [place] LOWER EXTREMITY VENOUS (DVT) (7a-7p)  Result Date: 10/29/2021  Lower Venous DVT Study Patient Name:  SHIRELY TOREN  Date of Exam:   10/29/2021 Medical Rec #: [id number]        Accession #:    [id number] Date of Birth: 05-05-1956        Patient Gender: F Patient Age:   5 years Exam Location:  [facility] Procedure:      VAS [place] LOWER EXTREMITY VENOUS (DVT) Referring Phys: JOSHUA HONG --------------------------------------------------------------------------------  Indications: Swelling, and SOB.  Comparison Study: no prior studies Performing Technologist: Darlin Coco RDMS, RVT  Examination Guidelines: A complete evaluation includes B-mode imaging, spectral Doppler, color Doppler, and power Doppler as needed of all accessible portions of each vessel. Bilateral testing is considered an integral part of a complete examination. Limited examinations for reoccurring indications may be performed as noted. The reflux portion of the exam is performed with the  patient in reverse Trendelenburg.  +-----+---------------+---------+-----------+----------+--------------+  RIGHT Compressibility Phasicity Spontaneity Properties Thrombus Aging  +-----+---------------+---------+-----------+----------+--------------+  CFV   Full            Yes       Yes                                    +-----+---------------+---------+-----------+----------+--------------+   +---------+---------------+---------+-----------+----------+-------------------+  LEFT      Compressibility Phasicity Spontaneity Properties Thrombus Aging       +---------+---------------+---------+-----------+----------+-------------------+  CFV                       Yes       Yes                                         +---------+---------------+---------+-----------+----------+-------------------+  SFJ                       Yes       Yes                                         +---------+---------------+---------+-----------+----------+-------------------+  FV Prox   Full                                                                  +---------+---------------+---------+-----------+----------+-------------------+  FV Mid    Full                                                                  +---------+---------------+---------+-----------+----------+-------------------+  FV Distal Full                                                                  +---------+---------------+---------+-----------+----------+-------------------+  PFV       Full                                                                  +---------+---------------+---------+-----------+----------+-------------------+  POP       Full                                                                  +---------+---------------+---------+-----------+----------+-------------------+  Soleal    Full                                             notably dilated                                                                  Soleal Vein                                                                       observed during                                                                  Peroneal evalution   +---------+---------------+---------+-----------+----------+-------------------+  Gastroc   Full                                                                  +---------+---------------+---------+-----------+----------+-------------------+  Summary: RIGHT: - No evidence of common femoral vein obstruction.  LEFT: - There is no evidence of deep vein thrombosis in the lower extremity. Notably dilated soleal vein without evidence of obstruction.  - No cystic structure found in the popliteal fossa.  *See table(s) above for measurements and observations. Electronically signed by Monica Martinez MD on 10/29/2021 at 5:23:08 PM.    Final     Scheduled Meds:  atorvastatin  10 mg Oral QHS   diltiazem  60 mg Oral Q6H   levothyroxine  50 mcg Oral QAC breakfast   pantoprazole  40 mg Oral BID     LOS: 1 day   Time spent: 4min  Jovonna Nickell C Cleatus Gabriel, DO [facility]  If 7PM-7AM, please contact night-coverage [email or web address]  10/31/2021, 7:30 AM

## 2021-10-31 NOTE — Plan of Care (Signed)
?  Problem: Education: ?Goal: Knowledge of disease or condition will improve ?Outcome: Progressing ?Goal: Understanding of medication regimen will improve ?Outcome: Progressing ?Goal: Individualized Educational Video(s) ?Outcome: Progressing ?  ?Problem: Activity: ?Goal: Ability to tolerate increased activity will improve ?Outcome: Progressing ?  ?Problem: Cardiac: ?Goal: Ability to achieve and maintain adequate cardiopulmonary perfusion will improve ?Outcome: Progressing ?  ?Problem: Health Behavior/Discharge Planning: ?Goal: Ability to safely manage health-related needs after discharge will improve ?Outcome: Progressing ?  ?Problem: Education: ?Goal: Knowledge of General Education information will improve ?Description: Including pain rating scale, medication(s)/side effects and non-pharmacologic comfort measures ?Outcome: Progressing ?  ?Problem: Health Behavior/Discharge Planning: ?Goal: Ability to manage health-related needs will improve ?Outcome: Progressing ?  ?Problem: Clinical Measurements: ?Goal: Ability to maintain clinical measurements within normal limits will improve ?Outcome: Progressing ?Goal: Will remain free from infection ?Outcome: Progressing ?Goal: Diagnostic test results will improve ?Outcome: Progressing ?Goal: Respiratory complications will improve ?Outcome: Progressing ?Goal: Cardiovascular complication will be avoided ?Outcome: Progressing ?  ?Problem: Activity: ?Goal: Risk for activity intolerance will decrease ?Outcome: Progressing ?  ?Problem: Nutrition: ?Goal: Adequate nutrition will be maintained ?Outcome: Progressing ?  ?Problem: Coping: ?Goal: Level of anxiety will decrease ?Outcome: Progressing ?  ?Problem: Elimination: ?Goal: Will not experience complications related to bowel motility ?Outcome: Progressing ?Goal: Will not experience complications related to urinary retention ?Outcome: Progressing ?  ?Problem: Pain Managment: ?Goal: General experience of comfort will improve ?Outcome:  Progressing ?  ?Problem: Safety: ?Goal: Ability to remain free from injury will improve ?Outcome: Progressing ?  ?

## 2021-10-31 NOTE — Progress Notes (Signed)
SATURATION QUALIFICATIONS: (This note is used to comply with regulatory documentation for home oxygen) ? ?Patient Saturations on Room Air at Rest = 97% ? ?Patient Saturations on Room Air while Ambulating = 95% ? ?Patient Saturations on 0 Liters of oxygen while Ambulating = 0% ? ?Please briefly explain why patient needs home oxygen:  Patient does not require home oxygen due to saturations, she feels she can breathe better when she is wearing the nasal cannula ?

## 2021-11-01 LAB — CBC
HCT: 31.8 % — ABNORMAL LOW (ref 36.0–46.0)
Hemoglobin: 10.5 g/dL — ABNORMAL LOW (ref 12.0–15.0)
MCH: 34.8 pg — ABNORMAL HIGH (ref 26.0–34.0)
MCHC: 33 g/dL (ref 30.0–36.0)
MCV: 105.3 fL — ABNORMAL HIGH (ref 80.0–100.0)
Platelets: 241 10*3/uL (ref 150–400)
RBC: 3.02 MIL/uL — ABNORMAL LOW (ref 3.87–5.11)
RDW: 15.2 % (ref 11.5–15.5)
WBC: 6.7 10*3/uL (ref 4.0–10.5)
nRBC: 0 % (ref 0.0–0.2)

## 2021-11-01 LAB — BASIC METABOLIC PANEL
Anion gap: 9 (ref 5–15)
BUN: 28 mg/dL — ABNORMAL HIGH (ref 8–23)
CO2: 22 mmol/L (ref 22–32)
Calcium: 8.6 mg/dL — ABNORMAL LOW (ref 8.9–10.3)
Chloride: 97 mmol/L — ABNORMAL LOW (ref 98–111)
Creatinine, Ser: 1.06 mg/dL — ABNORMAL HIGH (ref 0.44–1.00)
GFR, Estimated: 58 mL/min — ABNORMAL LOW (ref >60)
Glucose, Bld: 198 mg/dL — ABNORMAL HIGH (ref 70–99)
Potassium: 4.6 mmol/L (ref 3.5–5.1)
Sodium: 128 mmol/L — ABNORMAL LOW (ref 135–145)

## 2021-11-01 MED ORDER — PREDNISONE 10 MG PO TABS: ORAL_TABLET | ORAL | 0 refills | Status: DC

## 2021-11-01 MED ORDER — DILTIAZEM HCL ER COATED BEADS 120 MG PO CP24: 120.0000 mg | ORAL_CAPSULE | Freq: Every day | ORAL | 11 refills | Status: DC

## 2021-11-01 NOTE — Progress Notes (Signed)
Patient and her daughter were given discharge instructions and stated understanding. ?

## 2021-11-01 NOTE — Progress Notes (Signed)
?  Mobility Specialist Criteria Algorithm Info. ? ? ? 11/01/21 1055  ?Mobility  ?Activity Ambulated with assistance in hallway;Ambulated with assistance to bathroom;Dangled on edge of bed  ?Range of Motion/Exercises Active;All extremities  ?Level of Assistance Standby assist, set-up cues, supervision of patient - no hands on  ?Assistive Device None; Wall rails  ?Distance Ambulated (ft) 40 ft  ?Activity Response Tolerated well  ? ?Patient received in supine agreeable to participate. Was independent w/ bed mobility and ambulated to bathroom and in hallway supervision level with slow gait. Oxygen saturation maintained >95% on RA during session. Tolerated ambulation well without complaint or incident. Was left dangling EOB with all needs met, call bell in reach. MD present. ? ?11/01/2021 ?11:00 AM ? ?[place] Mayleigh Tetrault, CMS, BS EXP ?[facility] Services  ?WCBJS:[phone] ?Office: [phone] ? ?

## 2021-11-01 NOTE — Plan of Care (Signed)
?  Problem: Education: ?Goal: Knowledge of disease or condition will improve ?Outcome: Adequate for Discharge ?Goal: Understanding of medication regimen will improve ?Outcome: Adequate for Discharge ?Goal: Individualized Educational Video(s) ?Outcome: Adequate for Discharge ?  ?Problem: Activity: ?Goal: Ability to tolerate increased activity will improve ?Outcome: Adequate for Discharge ?  ?Problem: Cardiac: ?Goal: Ability to achieve and maintain adequate cardiopulmonary perfusion will improve ?Outcome: Adequate for Discharge ?  ?Problem: Health Behavior/Discharge Planning: ?Goal: Ability to safely manage health-related needs after discharge will improve ?Outcome: Adequate for Discharge ?  ?Problem: Education: ?Goal: Knowledge of General Education information will improve ?Description: Including pain rating scale, medication(s)/side effects and non-pharmacologic comfort measures ?Outcome: Adequate for Discharge ?  ?Problem: Health Behavior/Discharge Planning: ?Goal: Ability to manage health-related needs will improve ?Outcome: Adequate for Discharge ?  ?Problem: Clinical Measurements: ?Goal: Ability to maintain clinical measurements within normal limits will improve ?Outcome: Adequate for Discharge ?Goal: Will remain free from infection ?Outcome: Adequate for Discharge ?Goal: Diagnostic test results will improve ?Outcome: Adequate for Discharge ?Goal: Respiratory complications will improve ?Outcome: Adequate for Discharge ?Goal: Cardiovascular complication will be avoided ?Outcome: Adequate for Discharge ?  ?Problem: Activity: ?Goal: Risk for activity intolerance will decrease ?Outcome: Adequate for Discharge ?  ?Problem: Nutrition: ?Goal: Adequate nutrition will be maintained ?Outcome: Adequate for Discharge ?  ?Problem: Coping: ?Goal: Level of anxiety will decrease ?Outcome: Adequate for Discharge ?  ?Problem: Elimination: ?Goal: Will not experience complications related to bowel motility ?Outcome: Adequate for  Discharge ?Goal: Will not experience complications related to urinary retention ?Outcome: Adequate for Discharge ?  ?Problem: Pain Managment: ?Goal: General experience of comfort will improve ?Outcome: Adequate for Discharge ?  ?Problem: Safety: ?Goal: Ability to remain free from injury will improve ?Outcome: Adequate for Discharge ?  ?Problem: Skin Integrity: ?Goal: Risk for impaired skin integrity will decrease ?Outcome: Adequate for Discharge ?  ?

## 2021-11-01 NOTE — Progress Notes (Signed)
SATURATION QUALIFICATIONS: (This note is used to comply with regulatory documentation for home oxygen) ? ?Patient Saturations on Room Air at Rest = 98% ? ?Patient Saturations on Room Air while Ambulating = 97% ? ?Patient Saturations on 0 Liters of oxygen while Ambulating = n/a% ? ?Please briefly explain why patient needs home oxygen: ?

## 2021-11-01 NOTE — Discharge Summary (Signed)
Physician Discharge Summary  Sandy [name] PJA:[id number] DOB: 1956/04/13 DOA: 10/29/2021  PCP: [name], MD  Admit date: 10/29/2021 Discharge date: 11/01/2021  Admitted From: Home Disposition: Home  Recommendations for Outpatient Follow-up:  Follow up with PCP in 1-2 weeks Please obtain BMP/CBC in one week Please follow up with cardiothoracic surgery Dr. Roxan Hockey as scheduled  Home Health: None Equipment/Devices: None   Discharge Condition: Stable CODE STATUS: Full Diet recommendation: Low-salt low-fat diet as tolerated  Brief/Interim Summary: Sandy [name] is a 66 y.o. female with medical history significant of alcoholic cirrhosis with esophageal varices, history of GI bleed, lung cancer s/p right lobectomy, type 2 diabetes, hypertension hyperlipidemia, and hypothyroidism who presents with concerns of increasing shortness of breath.   Of note patient recently admitted February 2 to 5 for acute upper GI bleed hematemesis status post EGD -notable portal hypertension.  Multiple episodes of A-fib with RVR transient and resolving prior to discharge without intervention or medication changes.  Discharge Diagnoses:  Principal Problem:   Atrial fibrillation with RVR ([place]) Active Problems:   Type 2 diabetes mellitus (HCC)   Alcoholic cirrhosis (HCC)   History of esophageal varices   S/P lobectomy of lung   Essential hypertension   History of lung cancer   Hypothyroidism  Acute symptomatic atrial fibrillation with RVR (HCC) -RVR resolved, continue home nadolol and diltiazem  -Discontinue spironolactone given hypotension    History of lung cancer Dyspnea, chronic Pleuritic chest pain, severe, intractable -S/p Right lumbectomy.   -CTA grossly unchanged from 2021 per personal review -Procalcitonin negative, less likely infectious process -Follows with Dr. Julien Nordmann oncology and Dr. Roxan Hockey cardiothoracic surgery as outpatient, continue follow-up as  scheduled -Given patient's negative work-up here defer to outpatient oncology and cardiothoracic surgery for further evaluation and treatment.  Initiated steroid taper per discussion with pulmonology but given negative findings no acute intervention or further treatment indicated.   Acute hypoxic respiratory failure, POA Secondary to splinting Confirmed on multiple ABG at intake, PvO2<32 Initially requiring supplemental oxygen -continue to wean back to room air within the first 24 hours Dimer elevated but CTA negative for filling defect Respiratory panel negative Procalcitonin negative less likely infectious process given above Oxygen weaned to room air, patient able to ambulate today without hypoxia although remains subjectively "short of breath" she has no further episodes of hypoxia   Hypothyroidism - Repeat TSH within normal limits.  Continue Synthroid.   Essential hypertension -Continue nadolol and diltiazem, discontinue spironolactone given intolerance in the setting of low blood pressure   Alcoholic cirrhosis (HCC) - Has history of esophageal varices.  Follows with hepatologist at [facility]. - Reports last drink and new year and perhaps only has about 4 drinks per year   Type 2 diabetes mellitus ([place]), well controlled - Last hemoglobin A1c of 4.3 in 2/2  Discharge Instructions  Discharge Instructions     Call MD for:  difficulty breathing, headache or visual disturbances   Complete by: As directed    Call MD for:  extreme fatigue   Complete by: As directed    Call MD for:  persistant dizziness or light-headedness   Complete by: As directed    Call MD for:  severe uncontrolled pain   Complete by: As directed    Diet - low sodium heart healthy   Complete by: As directed    Discharge instructions   Complete by: As directed    Follow-up with Dr. Roxan Hockey as scheduled.   Discharge patient  Complete by: As directed    Discharge disposition: 01-Home or Self Care    Discharge patient date: 11/01/2021   Increase activity slowly   Complete by: As directed       Allergies as of 11/01/2021       Reactions   Codeine Nausea And Vomiting   Nsaids    Avoid due to esophageal varices   Ceftriaxone Rash        Medication List     STOP taking these medications    losartan 100 MG tablet Commonly known as: COZAAR       TAKE these medications    atorvastatin 10 MG tablet Commonly known as: LIPITOR Take 10 mg by mouth at bedtime.   diltiazem 120 MG 24 hr capsule Commonly known as: Cardizem CD Take 1 capsule (120 mg total) by mouth daily.   Integra Plus Caps TAKE 1 CAPSULE BY MOUTH DAILY What changed: when to take this   levothyroxine 50 MCG tablet Commonly known as: SYNTHROID Take 50 mcg by mouth daily before breakfast.   metFORMIN 500 MG 24 hr tablet Commonly known as: GLUCOPHAGE-XR Take 500 mg by mouth at bedtime.   nadolol 20 MG tablet Commonly known as: CORGARD Take 20 mg by mouth every evening.   omeprazole 40 MG capsule Commonly known as: PRILOSEC Take 1 capsule (40 mg total) by mouth in the morning and at bedtime.   predniSONE 10 MG tablet Commonly known as: DELTASONE Take 4 tablets (40 mg total) by mouth daily for 3 days, THEN 3 tablets (30 mg total) daily for 3 days, THEN 2 tablets (20 mg total) daily for 3 days, THEN 1 tablet (10 mg total) daily for 3 days. Start taking on: November 01, 2021        Allergies  Allergen Reactions   Codeine Nausea And Vomiting   Nsaids     Avoid due to esophageal varices   Ceftriaxone Rash    Consultations: None  Procedures/Studies: DG Chest 1 View  Result Date: 10/30/2021 CLINICAL DATA:  Shortness of breath. EXAM: CHEST  1 VIEW COMPARISON:  Chest radiograph dated 10/29/2021 and CT dated 10/29/2021 FINDINGS: Similar appearance of the right lung with volume loss and architectural distortion and small effusion. The left lung remains clear. No pneumothorax. There is shift of the  mediastinum to the right similar to prior radiograph. Stable cardiomediastinal silhouette. No acute osseous pathology. IMPRESSION: No interval change. Electronically Signed   By: Anner Crete M.D.   On: 10/30/2021 21:45   CT Angio Chest PE W and/or Wo Contrast  Result Date: 10/29/2021 CLINICAL DATA:  Shortness of breath EXAM: CT ANGIOGRAPHY CHEST WITH CONTRAST TECHNIQUE: Multidetector CT imaging of the chest was performed using the standard protocol during bolus administration of intravenous contrast. Multiplanar CT image reconstructions and MIPs were obtained to evaluate the vascular anatomy. RADIATION DOSE REDUCTION: This exam was performed according to the departmental dose-optimization program which includes automated exposure control, adjustment of the mA and/or kV according to patient size and/or use of iterative reconstruction technique. CONTRAST:  25mL OMNIPAQUE IOHEXOL 350 MG/ML SOLN COMPARISON:  Chest x-ray 10/29/2021, chest CT 10/23/2018, 05/21/2019, 08/26/2019, 01/06/2020, 06/12/2020, 01/01/2021 FINDINGS: Cardiovascular: Satisfactory opacification of the pulmonary arteries to the segmental level. No evidence of pulmonary embolism. Mild aortic atherosclerosis. No aneurysm or dissection. Coronary vascular calcification. Borderline to mild cardiomegaly. Small pericardial effusion which is increased compared to prior. Mediastinum/Nodes: Midline trachea. No thyroid mass. Esophagus within normal limits. No increasing adenopathy. Lungs/Pleura: Volume loss right  thorax with shift of mediastinal contents to the right consistent with history of prior partial lung resection. Slowly progressive consolidation most notable at the right apex. Underlying emphysema. Increased right pleural effusion. Upper Abdomen: Increased ascites within the upper abdomen. Cirrhotic morphology of the liver. Partially visualized right renal cysts. Spleen is incompletely visualized but appears enlarged. Musculoskeletal: No acute  osseous abnormality Review of the MIP images confirms the above findings. IMPRESSION: 1. Negative for acute pulmonary embolus. 2. Volume loss in the right thorax with shift of mediastinal contents to the right consistent with history of prior lung surgery. Architectural distortion and soft tissue density in the right upper lobe and hilar region some of which is felt secondary to post therapy change, however there has been slowly progressive consolidation most notable at the right apex. Findings could be due to superimposed infection though neoplasm is also a concern. Pulmonary follow-up with possible PET CT follow-up is suggested. Overall there is increased right pleural effusion compared to the previous exams. 3. Liver cirrhosis and splenomegaly. Increased ascites within the upper abdomen 4. Small pericardial effusion which is also increased compared to prior. Aortic Atherosclerosis (ICD10-I70.0) and Emphysema (ICD10-J43.9). Electronically Signed   By: Donavan Foil M.D.   On: 10/29/2021 18:46   DG Chest Port 1 View  Result Date: 10/29/2021 CLINICAL DATA:  Shortness of breath EXAM: PORTABLE CHEST 1 VIEW COMPARISON:  09/23/2021, 01/01/2021, chest CT 01/01/2021, 06/12/2020. FINDINGS: Volume loss and mediastinal shift to the right. Architectural distortion, parenchymal consolidation and pleural thickening or effusion without significant change since 09/23/2021. There may be slight increased pleural effusion or thickening compared to more remote examination from 2021. Left lung is grossly clear. IMPRESSION: 1. Volume loss and postsurgical changes in the right thorax with consolidation and distortion in the right hilar region as before. 2. Circumferential pleural thickening stable with most recent priors, may be slightly increased compared to more remote exams potentially due to presence of increased pleural effusion or possible pleural disease. Follow-up chest CT may be helpful to further assess. Electronically  Signed   By: Donavan Foil M.D.   On: 10/29/2021 16:17   ECHOCARDIOGRAM COMPLETE  Result Date: 10/30/2021    ECHOCARDIOGRAM REPORT   Patient Name:   Sandy [name] Date of Exam: 10/30/2021 Medical Rec #:  [id number]       Height:       63.0 in Accession #:    [id number]      Weight:       126.1 lb Date of Birth:  [birth year]       BSA:          1.589 m Patient Age:    73 years        BP:           114/63 mmHg Patient Gender: F               HR:           83 bpm. Exam Location:  Inpatient Procedure: 2D Echo, Cardiac Doppler, Color Doppler and Intracardiac            Opacification Agent Indications:    Atrial Fibrillation I48.91  History:        Patient has prior history of Echocardiogram examinations, most                 recent 09/07/2020. COPD; Risk Factors:Hypertension, Diabetes,                 Dyslipidemia and  Former Smoker. Hypothyoidism. S/p Right                 lumbectomy.  Sonographer:    Darlina Sicilian RDCS Referring Phys: [id number] [place]  1. Left ventricular ejection fraction, by estimation, is 65 to 70%. The left ventricle has normal function. The left ventricle has no regional wall motion abnormalities. Left ventricular diastolic parameters are indeterminate. Elevated left ventricular end-diastolic pressure.  2. Right ventricular systolic function is normal. The right ventricular size is normal.  3. A small pericardial effusion is present. The pericardial effusion is posterior to the left ventricle. There is no evidence of cardiac tamponade.  4. The mitral valve is normal in structure. Trivial mitral valve regurgitation. No evidence of mitral stenosis. Moderate mitral annular calcification.  5. The aortic valve is tricuspid. There is mild calcification of the aortic valve. Aortic valve regurgitation is mild to moderate. No aortic stenosis is present.  6. The inferior vena cava is normal in size with <50% respiratory variability, suggesting right atrial pressure of 8 mmHg. Comparison(s):  No significant change from prior study. Conclusion(s)/Recommendation(s): Otherwise normal echocardiogram, with minor abnormalities described in the report. FINDINGS  Left Ventricle: Left ventricular ejection fraction, by estimation, is 65 to 70%. The left ventricle has normal function. The left ventricle has no regional wall motion abnormalities. Definity contrast agent was given IV to delineate the left ventricular  endocardial borders. The left ventricular internal cavity size was normal in size. There is no left ventricular hypertrophy. Left ventricular diastolic parameters are indeterminate. Elevated left ventricular end-diastolic pressure. Right Ventricle: The right ventricular size is normal. Right vetricular wall thickness was not well visualized. Right ventricular systolic function is normal. Left Atrium: Left atrial size was normal in size. Right Atrium: Right atrial size was not well visualized. Pericardium: A small pericardial effusion is present. The pericardial effusion is posterior to the left ventricle. There is no evidence of cardiac tamponade. Mitral Valve: The mitral valve is normal in structure. Moderate mitral annular calcification. Trivial mitral valve regurgitation. No evidence of mitral valve stenosis. Tricuspid Valve: The tricuspid valve is normal in structure. Tricuspid valve regurgitation is trivial. No evidence of tricuspid stenosis. Aortic Valve: The aortic valve is tricuspid. There is mild calcification of the aortic valve. Aortic valve regurgitation is mild to moderate. No aortic stenosis is present. Pulmonic Valve: The pulmonic valve was not well visualized. Pulmonic valve regurgitation is not visualized. No evidence of pulmonic stenosis. Aorta: The aortic root, ascending aorta, aortic arch and descending aorta are all structurally normal, with no evidence of dilitation or obstruction. Venous: The inferior vena cava is normal in size with less than 50% respiratory variability,  suggesting right atrial pressure of 8 mmHg. IAS/Shunts: The interatrial septum was not well visualized.  LEFT VENTRICLE PLAX 2D LVIDd:         3.80 cm   Diastology LVIDs:         2.10 cm   LV e' medial:    6.90 cm/s LV PW:         1.00 cm   LV E/e' medial:  17.1 LV IVS:        0.90 cm   LV e' lateral:   9.11 cm/s LVOT diam:     2.10 cm   LV E/e' lateral: 13.0 LVOT Area:     3.46 cm  LEFT ATRIUM         Index LA diam:    3.10 cm 1.95 cm/m  AORTA Ao Root diam: 2.90 cm MITRAL VALVE MV Area (PHT): 3.85 cm     SHUNTS MV Decel Time: 197 msec     Systemic Diam: 2.10 cm MV E velocity: 118.00 cm/s MV A velocity: 137.00 cm/s MV E/A ratio:  0.86 Buford Dresser MD Electronically signed by Buford Dresser MD Signature Date/Time: 10/30/2021/1:11:02 PM    Final    VAS [place] LOWER EXTREMITY VENOUS (DVT) (7a-7p)  Result Date: 10/29/2021  Lower Venous DVT Study Patient Name:  Sandy [name]  Date of Exam:   10/29/2021 Medical Rec #: [id number]        Accession #:    [id number] Date of Birth: Jan 24, 1956        Patient Gender: F Patient Age:   44 years Exam Location:  [facility] Procedure:      VAS [place] LOWER EXTREMITY VENOUS (DVT) Referring Phys: JOSHUA HONG --------------------------------------------------------------------------------  Indications: Swelling, and SOB.  Comparison Study: no prior studies Performing Technologist: Darlin Coco RDMS, RVT  Examination Guidelines: A complete evaluation includes B-mode imaging, spectral Doppler, color Doppler, and power Doppler as needed of all accessible portions of each vessel. Bilateral testing is considered an integral part of a complete examination. Limited examinations for reoccurring indications may be performed as noted. The reflux portion of the exam is performed with the patient in reverse Trendelenburg.  +-----+---------------+---------+-----------+----------+--------------+  RIGHT Compressibility Phasicity Spontaneity Properties Thrombus Aging   +-----+---------------+---------+-----------+----------+--------------+  CFV   Full            Yes       Yes                                    +-----+---------------+---------+-----------+----------+--------------+   +---------+---------------+---------+-----------+----------+-------------------+  LEFT      Compressibility Phasicity Spontaneity Properties Thrombus Aging       +---------+---------------+---------+-----------+----------+-------------------+  CFV                       Yes       Yes                                         +---------+---------------+---------+-----------+----------+-------------------+  SFJ                       Yes       Yes                                         +---------+---------------+---------+-----------+----------+-------------------+  FV Prox   Full                                                                  +---------+---------------+---------+-----------+----------+-------------------+  FV Mid    Full                                                                  +---------+---------------+---------+-----------+----------+-------------------+  FV Distal Full                                                                  +---------+---------------+---------+-----------+----------+-------------------+  PFV       Full                                                                  +---------+---------------+---------+-----------+----------+-------------------+  POP       Full                                                                  +---------+---------------+---------+-----------+----------+-------------------+  Soleal    Full                                             notably dilated                                                                  Soleal Vein                                                                      observed during                                                                  Peroneal evalution    +---------+---------------+---------+-----------+----------+-------------------+  Gastroc   Full                                                                  +---------+---------------+---------+-----------+----------+-------------------+     Summary: RIGHT: - No evidence of common femoral vein obstruction.  LEFT: - There is no evidence of deep vein thrombosis in the lower extremity. Notably dilated soleal vein without evidence of obstruction.  - No cystic structure found in the popliteal fossa.  *See  table(s) above for measurements and observations. Electronically signed by Monica Martinez MD on 10/29/2021 at 5:23:08 PM.    Final      Subjective: No acute issues or events overnight denies nausea vomiting diarrhea constipation headache fevers chills or chest pain.  Dyspnea with exertion ongoing but without hypoxia   Discharge Exam: Vitals:   11/01/21 0302 11/01/21 0743  BP: (!) 109/54 109/64  Pulse: 78 77  Resp: 20 11  Temp: (!) 97.4 F (36.3 C) (!) 97.4 F (36.3 C)  SpO2: 92% 99%   Vitals:   10/31/21 1953 10/31/21 2337 11/01/21 0302 11/01/21 0743  BP: (!) 111/55 124/64 (!) 109/54 109/64  Pulse: 71 72 78 77  Resp: 19 17 20 11   Temp: 97.6 F (36.4 C) 97.6 F (36.4 C) (!) 97.4 F (36.3 C) (!) 97.4 F (36.3 C)  TempSrc: Oral Axillary Oral   SpO2: 98% 99% 92% 99%  Weight:      Height:        General: Pt is alert, awake, not in acute distress Cardiovascular: RRR, S1/S2 +, no rubs, no gallops Respiratory: CTA bilaterally, no wheezing, no rhonchi Abdominal: Soft, NT, ND, bowel sounds + Extremities: no edema, no cyanosis    The results of significant diagnostics from this hospitalization (including imaging, microbiology, ancillary and laboratory) are listed below for reference.     Microbiology: Recent Results (from the past 240 hour(s))  Resp Panel by RT-PCR (Flu A&B, Covid) Nasopharyngeal Swab     Status: None   Collection Time: 10/29/21 10:13 PM   Specimen:  Nasopharyngeal Swab; Nasopharyngeal(NP) swabs in vial transport medium  Result Value Ref Range Status   SARS Coronavirus 2 by RT PCR NEGATIVE NEGATIVE Final    Comment: (NOTE) SARS-CoV-2 target nucleic acids are NOT DETECTED.  The SARS-CoV-2 RNA is generally detectable in upper respiratory specimens during the acute phase of infection. The lowest concentration of SARS-CoV-2 viral copies this assay can detect is 138 copies/mL. A negative result does not preclude SARS-Cov-2 infection and should not be used as the sole basis for treatment or other patient management decisions. A negative result may occur with  improper specimen collection/handling, submission of specimen other than nasopharyngeal swab, presence of viral mutation(s) within the areas targeted by this assay, and inadequate number of viral copies(<138 copies/mL). A negative result must be combined with clinical observations, patient history, and epidemiological information. The expected result is Negative.  Fact Sheet for Patients:  EntrepreneurPulse.com.au  Fact Sheet for Healthcare Providers:  IncredibleEmployment.be  This test is no t yet approved or cleared by the [place] FDA and  has been authorized for detection and/or diagnosis of SARS-CoV-2 by FDA under an Emergency Use Authorization (EUA). This EUA will remain  in effect (meaning this test can be used) for the duration of the COVID-19 declaration under Section 564(b)(1) of the Act, 21 U.S.C.section 360bbb-3(b)(1), unless the authorization is terminated  or revoked sooner.       Influenza A by PCR NEGATIVE NEGATIVE Final   Influenza B by PCR NEGATIVE NEGATIVE Final    Comment: (NOTE) The Xpert Xpress SARS-CoV-2/FLU/RSV plus assay is intended as an aid in the diagnosis of influenza from Nasopharyngeal swab specimens and should not be used as a sole basis for treatment. Nasal washings and aspirates are unacceptable for  Xpert Xpress SARS-CoV-2/FLU/RSV testing.  Fact Sheet for Patients: EntrepreneurPulse.com.au  Fact Sheet for Healthcare Providers: IncredibleEmployment.be  This test is not yet approved or cleared by the [place] and  has been authorized for detection and/or diagnosis of SARS-CoV-2 by FDA under an Emergency Use Authorization (EUA). This EUA will remain in effect (meaning this test can be used) for the duration of the COVID-19 declaration under Section 564(b)(1) of the Act, 21 U.S.C. section 360bbb-3(b)(1), unless the authorization is terminated or revoked.  Performed at [facility], [place] [address], [place], [place] [address]   MRSA Next Gen by PCR, Nasal     Status: None   Collection Time: 10/30/21 12:59 AM   Specimen: Nasal Mucosa; Nasal Swab  Result Value Ref Range Status   MRSA by PCR Next Gen NOT DETECTED NOT DETECTED Final    Comment: (NOTE) The GeneXpert MRSA Assay (FDA approved for NASAL specimens only), is one component of a comprehensive MRSA colonization surveillance program. It is not intended to diagnose MRSA infection nor to guide or monitor treatment for MRSA infections. Test performance is not FDA approved in patients less than 30 years old. Performed at [facility], [place] [address] [place], [place] [address]      Labs: BNP (last 3 results) Recent Labs    09/26/21 0254 10/29/21 1611 10/30/21 2342  BNP 1,008.3* 191.2* 315.1*   Basic Metabolic Panel: Recent Labs  Lab 10/29/21 1611 10/29/21 2224 10/30/21 2342 11/01/21 0055  NA 135 136 131* 128*  K 3.8 3.6 4.1 4.6  CL 100  --  101 97*  CO2 26  --  21* 22  GLUCOSE 120*  --  179* 198*  BUN 14  --  21 28*  CREATININE 0.72  --  0.98 1.06*  CALCIUM 8.7*  --  8.7* 8.6*   Liver Function Tests: Recent Labs  Lab 10/29/21 1611  AST 42*  ALT 22  ALKPHOS 121  BILITOT 4.6*  PROT 6.8  ALBUMIN 2.4*   No results for input(s): LIPASE, AMYLASE in  the last 168 hours. No results for input(s): AMMONIA in the last 168 hours. CBC: Recent Labs  Lab 10/29/21 1611 10/29/21 2224 10/30/21 0407 10/30/21 2342 11/01/21 0055  WBC 4.0  --  3.3* 9.1 6.7  NEUTROABS 2.7  --   --   --   --   HGB 12.1 13.6 11.7* 10.7* 10.5*  HCT 37.7 40.0 36.2 32.6* 31.8*  MCV 107.4*  --  106.8* 105.2* 105.3*  PLT 216  --  234 289 241   Cardiac Enzymes: No results for input(s): CKTOTAL, CKMB, CKMBINDEX, TROPONINI in the last 168 hours. BNP: Invalid input(s): POCBNP CBG: No results for input(s): GLUCAP in the last 168 hours. D-Dimer Recent Labs    10/29/21 1655  DDIMER 3.22*   Hgb A1c No results for input(s): HGBA1C in the last 72 hours. Lipid Profile No results for input(s): CHOL, HDL, LDLCALC, TRIG, CHOLHDL, LDLDIRECT in the last 72 hours. Thyroid function studies Recent Labs    10/29/21 2202  TSH 3.695   Anemia work up No results for input(s): VITAMINB12, FOLATE, FERRITIN, TIBC, IRON, RETICCTPCT in the last 72 hours. Urinalysis    Component Value Date/Time   COLORURINE YELLOW 03/05/2021 1221   APPEARANCEUR CLEAR 03/05/2021 1221   LABSPEC 1.014 03/05/2021 1221   PHURINE 9.0 (H) 03/05/2021 1221   GLUCOSEU NEGATIVE 03/05/2021 1221   HGBUR SMALL (A) 03/05/2021 1221   BILIRUBINUR NEGATIVE 03/05/2021 1221   KETONESUR NEGATIVE 03/05/2021 1221   PROTEINUR NEGATIVE 03/05/2021 1221   NITRITE NEGATIVE 03/05/2021 1221   LEUKOCYTESUR NEGATIVE 03/05/2021 1221   Sepsis Labs Invalid input(s): PROCALCITONIN,  WBC,  [facility] Recent Results (from the past 240 hour(s))  Resp Panel by RT-PCR (Flu A&B, Covid) Nasopharyngeal Swab     Status: None   Collection Time: 10/29/21 10:13 PM   Specimen: Nasopharyngeal Swab; Nasopharyngeal(NP) swabs in vial transport medium  Result Value Ref Range Status   SARS Coronavirus 2 by RT PCR NEGATIVE NEGATIVE Final    Comment: (NOTE) SARS-CoV-2 target nucleic acids are NOT DETECTED.  The SARS-CoV-2  RNA is generally detectable in upper respiratory specimens during the acute phase of infection. The lowest concentration of SARS-CoV-2 viral copies this assay can detect is 138 copies/mL. A negative result does not preclude SARS-Cov-2 infection and should not be used as the sole basis for treatment or other patient management decisions. A negative result may occur with  improper specimen collection/handling, submission of specimen other than nasopharyngeal swab, presence of viral mutation(s) within the areas targeted by this assay, and inadequate number of viral copies(<138 copies/mL). A negative result must be combined with clinical observations, patient history, and epidemiological information. The expected result is Negative.  Fact Sheet for Patients:  EntrepreneurPulse.com.au  Fact Sheet for Healthcare Providers:  IncredibleEmployment.be  This test is no t yet approved or cleared by the [place] FDA and  has been authorized for detection and/or diagnosis of SARS-CoV-2 by FDA under an Emergency Use Authorization (EUA). This EUA will remain  in effect (meaning this test can be used) for the duration of the COVID-19 declaration under Section 564(b)(1) of the Act, 21 U.S.C.section 360bbb-3(b)(1), unless the authorization is terminated  or revoked sooner.       Influenza A by PCR NEGATIVE NEGATIVE Final   Influenza B by PCR NEGATIVE NEGATIVE Final    Comment: (NOTE) The Xpert Xpress SARS-CoV-2/FLU/RSV plus assay is intended as an aid in the diagnosis of influenza from Nasopharyngeal swab specimens and should not be used as a sole basis for treatment. Nasal washings and aspirates are unacceptable for Xpert Xpress SARS-CoV-2/FLU/RSV testing.  Fact Sheet for Patients: EntrepreneurPulse.com.au  Fact Sheet for Healthcare Providers: IncredibleEmployment.be  This test is not yet approved or cleared by the  [place] FDA and has been authorized for detection and/or diagnosis of SARS-CoV-2 by FDA under an Emergency Use Authorization (EUA). This EUA will remain in effect (meaning this test can be used) for the duration of the COVID-19 declaration under Section 564(b)(1) of the Act, 21 U.S.C. section 360bbb-3(b)(1), unless the authorization is terminated or revoked.  Performed at [facility], [place] [address] [place], [place] [address]   MRSA Next Gen by PCR, Nasal     Status: None   Collection Time: 10/30/21 12:59 AM   Specimen: Nasal Mucosa; Nasal Swab  Result Value Ref Range Status   MRSA by PCR Next Gen NOT DETECTED NOT DETECTED Final    Comment: (NOTE) The GeneXpert MRSA Assay (FDA approved for NASAL specimens only), is one component of a comprehensive MRSA colonization surveillance program. It is not intended to diagnose MRSA infection nor to guide or monitor treatment for MRSA infections. Test performance is not FDA approved in patients less than 67 years old. Performed at [facility], [place] [address] [place], [place] [address]      Time coordinating discharge: Over 30 minutes  SIGNED:   Little Ishikawa, DO [facility] 11/01/2021, 4:46 PM Pager   If 7PM-7AM, please contact night-coverage [email or web address]

## 2021-11-03 ENCOUNTER — Emergency Department (HOSPITAL_COMMUNITY): Payer: Medicare Other

## 2021-11-03 ENCOUNTER — Encounter (HOSPITAL_COMMUNITY): Payer: Self-pay | Admitting: Emergency Medicine

## 2021-11-03 ENCOUNTER — Inpatient Hospital Stay (HOSPITAL_COMMUNITY)
Admission: EM | Admit: 2021-11-03 | Discharge: 2021-11-06 | DRG: 441 | Disposition: A | Discharge status: Skilled Nursing Facility | Payer: Medicare Other | Attending: Family Medicine | Admitting: Family Medicine

## 2021-11-03 ENCOUNTER — Other Ambulatory Visit: Payer: Self-pay

## 2021-11-03 DIAGNOSIS — D649 Anemia, unspecified: Secondary | ICD-10-CM | POA: Diagnosis present

## 2021-11-03 DIAGNOSIS — Z66 Do not resuscitate: Secondary | ICD-10-CM | POA: Diagnosis not present

## 2021-11-03 DIAGNOSIS — K3189 Other diseases of stomach and duodenum: Secondary | ICD-10-CM | POA: Diagnosis present

## 2021-11-03 DIAGNOSIS — Z8719 Personal history of other diseases of the digestive system: Secondary | ICD-10-CM | POA: Diagnosis not present

## 2021-11-03 DIAGNOSIS — G9341 Metabolic encephalopathy: Secondary | ICD-10-CM

## 2021-11-03 DIAGNOSIS — Z87891 Personal history of nicotine dependence: Secondary | ICD-10-CM | POA: Diagnosis not present

## 2021-11-03 DIAGNOSIS — R0609 Other forms of dyspnea: Secondary | ICD-10-CM | POA: Diagnosis not present

## 2021-11-03 DIAGNOSIS — E43 Unspecified severe protein-calorie malnutrition: Secondary | ICD-10-CM | POA: Diagnosis present

## 2021-11-03 DIAGNOSIS — R531 Weakness: Secondary | ICD-10-CM

## 2021-11-03 DIAGNOSIS — Z881 Allergy status to other antibiotic agents status: Secondary | ICD-10-CM

## 2021-11-03 DIAGNOSIS — Z7984 Long term (current) use of oral hypoglycemic drugs: Secondary | ICD-10-CM | POA: Diagnosis not present

## 2021-11-03 DIAGNOSIS — K7031 Alcoholic cirrhosis of liver with ascites: Secondary | ICD-10-CM | POA: Diagnosis present

## 2021-11-03 DIAGNOSIS — I480 Paroxysmal atrial fibrillation: Secondary | ICD-10-CM | POA: Diagnosis present

## 2021-11-03 DIAGNOSIS — Z79890 Hormone replacement therapy (postmenopausal): Secondary | ICD-10-CM | POA: Diagnosis not present

## 2021-11-03 DIAGNOSIS — E876 Hypokalemia: Secondary | ICD-10-CM | POA: Diagnosis present

## 2021-11-03 DIAGNOSIS — E039 Hypothyroidism, unspecified: Secondary | ICD-10-CM | POA: Diagnosis present

## 2021-11-03 DIAGNOSIS — R54 Age-related physical debility: Secondary | ICD-10-CM | POA: Diagnosis present

## 2021-11-03 DIAGNOSIS — Z902 Acquired absence of lung [part of]: Secondary | ICD-10-CM | POA: Diagnosis not present

## 2021-11-03 DIAGNOSIS — R911 Solitary pulmonary nodule: Secondary | ICD-10-CM | POA: Diagnosis present

## 2021-11-03 DIAGNOSIS — Z20822 Contact with and (suspected) exposure to covid-19: Secondary | ICD-10-CM | POA: Diagnosis present

## 2021-11-03 DIAGNOSIS — E785 Hyperlipidemia, unspecified: Secondary | ICD-10-CM | POA: Diagnosis present

## 2021-11-03 DIAGNOSIS — Z85118 Personal history of other malignant neoplasm of bronchus and lung: Secondary | ICD-10-CM | POA: Diagnosis not present

## 2021-11-03 DIAGNOSIS — E872 Acidosis, unspecified: Secondary | ICD-10-CM | POA: Diagnosis not present

## 2021-11-03 DIAGNOSIS — K7030 Alcoholic cirrhosis of liver without ascites: Secondary | ICD-10-CM | POA: Diagnosis not present

## 2021-11-03 DIAGNOSIS — Z885 Allergy status to narcotic agent status: Secondary | ICD-10-CM

## 2021-11-03 DIAGNOSIS — I10 Essential (primary) hypertension: Secondary | ICD-10-CM | POA: Diagnosis present

## 2021-11-03 DIAGNOSIS — K7682 Hepatic encephalopathy: Secondary | ICD-10-CM | POA: Diagnosis present

## 2021-11-03 DIAGNOSIS — Z6821 Body mass index (BMI) 21.0-21.9, adult: Secondary | ICD-10-CM

## 2021-11-03 DIAGNOSIS — Z79899 Other long term (current) drug therapy: Secondary | ICD-10-CM | POA: Diagnosis not present

## 2021-11-03 DIAGNOSIS — E119 Type 2 diabetes mellitus without complications: Secondary | ICD-10-CM

## 2021-11-03 DIAGNOSIS — Z886 Allergy status to analgesic agent status: Secondary | ICD-10-CM | POA: Diagnosis not present

## 2021-11-03 DIAGNOSIS — R4182 Altered mental status, unspecified: Secondary | ICD-10-CM

## 2021-11-03 LAB — COMPREHENSIVE METABOLIC PANEL
ALT: 88 U/L — ABNORMAL HIGH (ref 0–44)
AST: 141 U/L — ABNORMAL HIGH (ref 15–41)
Albumin: 2.8 g/dL — ABNORMAL LOW (ref 3.5–5.0)
Alkaline Phosphatase: 139 U/L — ABNORMAL HIGH (ref 38–126)
Anion gap: 10 (ref 5–15)
BUN: 23 mg/dL (ref 8–23)
CO2: 27 mmol/L (ref 22–32)
Calcium: 8.9 mg/dL (ref 8.9–10.3)
Chloride: 100 mmol/L (ref 98–111)
Creatinine, Ser: 0.67 mg/dL (ref 0.44–1.00)
GFR, Estimated: >60 mL/min (ref >60)
Glucose, Bld: 108 mg/dL — ABNORMAL HIGH (ref 70–99)
Potassium: 3.7 mmol/L (ref 3.5–5.1)
Sodium: 137 mmol/L (ref 135–145)
Total Bilirubin: 4.2 mg/dL — ABNORMAL HIGH (ref 0.3–1.2)
Total Protein: 7.5 g/dL (ref 6.5–8.1)

## 2021-11-03 LAB — URINALYSIS, ROUTINE W REFLEX MICROSCOPIC
Bilirubin Urine: NEGATIVE
Glucose, UA: NEGATIVE mg/dL
Hgb urine dipstick: NEGATIVE
Ketones, ur: NEGATIVE mg/dL
Leukocytes,Ua: NEGATIVE
Nitrite: NEGATIVE
Protein, ur: NEGATIVE mg/dL
Specific Gravity, Urine: 1.008 (ref 1.005–1.030)
pH: 8 (ref 5.0–8.0)

## 2021-11-03 LAB — I-STAT CHEM 8, ED
BUN: 28 mg/dL — ABNORMAL HIGH (ref 8–23)
Calcium, Ion: 1.06 mmol/L — ABNORMAL LOW (ref 1.15–1.40)
Chloride: 100 mmol/L (ref 98–111)
Creatinine, Ser: 0.6 mg/dL (ref 0.44–1.00)
Glucose, Bld: 108 mg/dL — ABNORMAL HIGH (ref 70–99)
HCT: 39 % (ref 36.0–46.0)
Hemoglobin: 13.3 g/dL (ref 12.0–15.0)
Potassium: 3.7 mmol/L (ref 3.5–5.1)
Sodium: 139 mmol/L (ref 135–145)
TCO2: 29 mmol/L (ref 22–32)

## 2021-11-03 LAB — APTT: aPTT: 31 seconds (ref 24–36)

## 2021-11-03 LAB — CBC
HCT: 41.5 % (ref 36.0–46.0)
Hemoglobin: 13.3 g/dL (ref 12.0–15.0)
MCH: 34.7 pg — ABNORMAL HIGH (ref 26.0–34.0)
MCHC: 32 g/dL (ref 30.0–36.0)
MCV: 108.4 fL — ABNORMAL HIGH (ref 80.0–100.0)
Platelets: 242 10*3/uL (ref 150–400)
RBC: 3.83 MIL/uL — ABNORMAL LOW (ref 3.87–5.11)
RDW: 14.8 % (ref 11.5–15.5)
WBC: 4.4 10*3/uL (ref 4.0–10.5)
nRBC: 0 % (ref 0.0–0.2)

## 2021-11-03 LAB — DIFFERENTIAL
Abs Immature Granulocytes: 0.05 10*3/uL (ref 0.00–0.07)
Basophils Absolute: 0 10*3/uL (ref 0.0–0.1)
Basophils Relative: 1 %
Eosinophils Absolute: 0.1 10*3/uL (ref 0.0–0.5)
Eosinophils Relative: 1 %
Immature Granulocytes: 1 %
Lymphocytes Relative: 9 %
Lymphs Abs: 0.4 10*3/uL — ABNORMAL LOW (ref 0.7–4.0)
Monocytes Absolute: 0.5 10*3/uL (ref 0.1–1.0)
Monocytes Relative: 11 %
Neutro Abs: 3.4 10*3/uL (ref 1.7–7.7)
Neutrophils Relative %: 77 %

## 2021-11-03 LAB — AMMONIA: Ammonia: 53 umol/L — ABNORMAL HIGH (ref 9–35)

## 2021-11-03 LAB — RAPID URINE DRUG SCREEN, HOSP PERFORMED
Amphetamines: NOT DETECTED
Barbiturates: NOT DETECTED
Benzodiazepines: NOT DETECTED
Cocaine: NOT DETECTED
Opiates: NOT DETECTED
Tetrahydrocannabinol: POSITIVE — AB

## 2021-11-03 LAB — BLOOD GAS, VENOUS
Acid-Base Excess: 5.8 mmol/L — ABNORMAL HIGH (ref 0.0–2.0)
Bicarbonate: 32.1 mmol/L — ABNORMAL HIGH (ref 20.0–28.0)
Drawn by: 62567
O2 Saturation: 34.1 %
Patient temperature: 35.1
pCO2, Ven: 49 mmHg (ref 44–60)
pH, Ven: 7.42 (ref 7.25–7.43)
pO2, Ven: <31 mmHg — CL (ref 32–45)

## 2021-11-03 LAB — PROTIME-INR
INR: 1.4 — ABNORMAL HIGH (ref 0.8–1.2)
Prothrombin Time: 17.5 seconds — ABNORMAL HIGH (ref 11.4–15.2)

## 2021-11-03 LAB — GLUCOSE, CAPILLARY: Glucose-Capillary: 163 mg/dL — ABNORMAL HIGH (ref 70–99)

## 2021-11-03 LAB — RESP PANEL BY RT-PCR (FLU A&B, COVID) ARPGX2
Influenza A by PCR: NEGATIVE
Influenza B by PCR: NEGATIVE
SARS Coronavirus 2 by RT PCR: NEGATIVE

## 2021-11-03 LAB — ETHANOL: Alcohol, Ethyl (B): <10 mg/dL (ref <10)

## 2021-11-03 LAB — TSH: TSH: 1.903 u[IU]/mL (ref 0.350–4.500)

## 2021-11-03 MED ORDER — ATORVASTATIN CALCIUM 10 MG PO TABS
10.0000 mg | ORAL_TABLET | Freq: Every day | ORAL | Status: DC
  Administered 2021-11-03 – 2021-11-05 (×3): 10 mg via ORAL
  Filled 2021-11-03 (×3): qty 1

## 2021-11-03 MED ORDER — DILTIAZEM HCL ER COATED BEADS 120 MG PO CP24
120.0000 mg | ORAL_CAPSULE | Freq: Every day | ORAL | Status: DC
Start: 2021-11-03 — End: 2021-11-06
  Administered 2021-11-03 – 2021-11-04 (×2): 120 mg via ORAL
  Filled 2021-11-03 (×4): qty 1

## 2021-11-03 MED ORDER — SODIUM CHLORIDE 0.9 % IV SOLN
100.0000 mL/h | INTRAVENOUS | Status: DC
  Administered 2021-11-03 – 2021-11-04 (×3): 100 mL/h via INTRAVENOUS

## 2021-11-03 MED ORDER — LEVOTHYROXINE SODIUM 50 MCG PO TABS
50.0000 ug | ORAL_TABLET | Freq: Every day | ORAL | Status: DC
  Administered 2021-11-04 – 2021-11-06 (×3): 50 ug via ORAL
  Filled 2021-11-03 (×3): qty 1

## 2021-11-03 MED ORDER — PREDNISONE 20 MG PO TABS: 20.0000 mg | ORAL_TABLET | Freq: Every day | ORAL | Status: DC

## 2021-11-03 MED ORDER — THIAMINE HCL 100 MG/ML IJ SOLN
100.0000 mg | Freq: Every day | INTRAMUSCULAR | Status: DC
  Administered 2021-11-03 – 2021-11-06 (×4): 100 mg via INTRAVENOUS
  Filled 2021-11-03 (×4): qty 2

## 2021-11-03 MED ORDER — LACTULOSE 10 GM/15ML PO SOLN
20.0000 g | Freq: Three times a day (TID) | ORAL | Status: DC
  Administered 2021-11-03 – 2021-11-05 (×8): 20 g via ORAL
  Filled 2021-11-03 (×9): qty 30

## 2021-11-03 MED ORDER — PREDNISONE 20 MG PO TABS
20.0000 mg | ORAL_TABLET | Freq: Every day | ORAL | Status: DC
  Administered 2021-11-03 – 2021-11-04 (×2): 20 mg via ORAL
  Filled 2021-11-03 (×2): qty 1

## 2021-11-03 MED ORDER — NADOLOL 20 MG PO TABS
20.0000 mg | ORAL_TABLET | Freq: Every evening | ORAL | Status: DC
Start: 2021-11-03 — End: 2021-11-06
  Administered 2021-11-03 – 2021-11-05 (×3): 20 mg via ORAL
  Filled 2021-11-03 (×5): qty 1

## 2021-11-03 MED ORDER — INSULIN ASPART 100 UNIT/ML IJ SOLN
2.0000 [IU] | Freq: Three times a day (TID) | INTRAMUSCULAR | Status: DC
  Administered 2021-11-04 – 2021-11-06 (×6): 2 [IU] via SUBCUTANEOUS

## 2021-11-03 MED ORDER — LACTULOSE ENEMA
300.0000 mL | Freq: Once | RECTAL | Status: DC
Start: 2021-11-03 — End: 2021-11-03
  Filled 2021-11-03: qty 300

## 2021-11-03 MED ORDER — LACTULOSE 10 GM/15ML PO SOLN
30.0000 g | Freq: Once | ORAL | Status: AC
  Administered 2021-11-03: 30 g via ORAL

## 2021-11-03 MED ORDER — INSULIN ASPART 100 UNIT/ML IJ SOLN
0.0000 [IU] | Freq: Three times a day (TID) | INTRAMUSCULAR | Status: DC
  Administered 2021-11-04 (×2): 1 [IU] via SUBCUTANEOUS
  Administered 2021-11-05: 2 [IU] via SUBCUTANEOUS
  Administered 2021-11-05: 1 [IU] via SUBCUTANEOUS
  Administered 2021-11-05: 3 [IU] via SUBCUTANEOUS
  Administered 2021-11-06: 2 [IU] via SUBCUTANEOUS

## 2021-11-03 MED ORDER — ENOXAPARIN SODIUM 40 MG/0.4ML IJ SOSY
40.0000 mg | PREFILLED_SYRINGE | INTRAMUSCULAR | Status: DC
  Administered 2021-11-03 – 2021-11-05 (×3): 40 mg via SUBCUTANEOUS
  Filled 2021-11-03 (×3): qty 0.4

## 2021-11-03 MED ORDER — ACETAMINOPHEN 650 MG RE SUPP: 325.0000 mg | Freq: Four times a day (QID) | RECTAL | Status: DC | PRN

## 2021-11-03 MED ORDER — FOLIC ACID 1 MG PO TABS
1.0000 mg | ORAL_TABLET | Freq: Every day | ORAL | Status: DC
  Administered 2021-11-03 – 2021-11-06 (×3): 1 mg via ORAL
  Filled 2021-11-03 (×4): qty 1

## 2021-11-03 MED ORDER — ACETAMINOPHEN 325 MG PO TABS: 325.0000 mg | ORAL_TABLET | Freq: Four times a day (QID) | ORAL | Status: DC | PRN

## 2021-11-03 MED ORDER — ADULT MULTIVITAMIN W/MINERALS CH
1.0000 | ORAL_TABLET | Freq: Every day | ORAL | Status: DC
  Administered 2021-11-04 – 2021-11-06 (×2): 1 via ORAL
  Filled 2021-11-03 (×3): qty 1

## 2021-11-03 MED ORDER — SODIUM CHLORIDE 0.9 % IV BOLUS
500.0000 mL | Freq: Once | INTRAVENOUS | Status: AC
  Administered 2021-11-03: 500 mL via INTRAVENOUS

## 2021-11-03 MED ORDER — PANTOPRAZOLE SODIUM 40 MG PO TBEC
40.0000 mg | DELAYED_RELEASE_TABLET | Freq: Two times a day (BID) | ORAL | Status: DC
  Administered 2021-11-03 – 2021-11-06 (×5): 40 mg via ORAL
  Filled 2021-11-03 (×6): qty 1

## 2021-11-03 NOTE — Assessment & Plan Note (Addendum)
Patient presented with obtunded mental status with elevated ammonia level ?Started lactulose with goal of 3 BMs per day.  Her mentation has improved.  Continue lactulose with goals of 3 BMs per day.  ?

## 2021-11-03 NOTE — Assessment & Plan Note (Signed)
Likely secondary to right lobectomy.  Currently reported stable. ?

## 2021-11-03 NOTE — Assessment & Plan Note (Signed)
Follow-up with [name] her oncologist for ongoing surveillance ?

## 2021-11-03 NOTE — Assessment & Plan Note (Signed)
Resume home medication. ?

## 2021-11-03 NOTE — Assessment & Plan Note (Signed)
We plan to resume all of her home blood pressure medications. ?

## 2021-11-03 NOTE — Assessment & Plan Note (Addendum)
Patient strongly advised to stop all alcohol consumption.  She reportedly was still drinking alcohol prior to this admission.  ?

## 2021-11-03 NOTE — Assessment & Plan Note (Signed)
Patient is followed by [name] her oncologist with follow-up planned April 2023. ?

## 2021-11-03 NOTE — Assessment & Plan Note (Addendum)
Patient is status post right lumpectomy. ?She had a recent CTA chest done at [facility] showing increased consolidation to the right apex with follow-up planned with her oncologist [name].  Consult to palliative care recommended.  ?

## 2021-11-03 NOTE — ED Notes (Signed)
Pt is confused.  Keeps pulling cardiac monitor off ?

## 2021-11-03 NOTE — ED Provider Notes (Signed)
?[facility] ?Provider Note ? ? ?CSN: [id number] ?Arrival date & time: 11/03/21  1043 ? ?  ? ?History ? ?Chief Complaint  ?Patient presents with  ? Altered Mental Status  ? ? ?Sandy [name] is a 66 y.o. female. ? ? ?Altered Mental Status ?Associated symptoms: no fever   ? ?Patient does have history of COPD, lung cancer, alcoholic cirrhosis, hypertension, atrial fibrillation ?Patient was brought to the ED for evaluation of altered mental status.  History is primarily provided by the sister.  She states that she seemed to be fine last evening although she has not been eating or drinking well for several days.  When she woke up this morning the patient was confused.  She did not know the year.  She did not know who the president was.  Sister also states that her urine has been dark and foul-smelling.  Patient was recently in the hospital for 4 atrial fibrillation according to family.  She was started on steroids but has never had issues when taking those before ? ?Patient denies any specific complaints here but she does appear listless and he is only answering with a few words at a time ?Previous records reviewed.  Patient was admitted to the hospital on March 10 and discharged on March 13.  She was treated for A-fib RVR ? ?Home Medications ?Prior to Admission medications   ?Medication Sig Start Date End Date Taking? Authorizing Provider  ?atorvastatin (LIPITOR) 10 MG tablet Take 10 mg by mouth at bedtime.    Yes [provider]  ?diltiazem (CARDIZEM CD) 120 MG 24 hr capsule Take 1 capsule (120 mg total) by mouth daily. 11/01/21 11/01/22 Yes Little Ishikawa, MD  ?FeFum-FePoly-FA-B Cmp-C-Biot (INTEGRA PLUS) CAPS TAKE 1 CAPSULE BY MOUTH DAILY ?Patient taking differently: Take 1 capsule by mouth every evening. 06/28/21  Yes Curt Bears, MD  ?levothyroxine (SYNTHROID) 50 MCG tablet Take 50 mcg by mouth daily before breakfast.   Yes [provider]  ?metFORMIN (GLUCOPHAGE-XR)  500 MG 24 hr tablet Take 500 mg by mouth at bedtime.   Yes [provider]  ?nadolol (CORGARD) 20 MG tablet Take 20 mg by mouth every evening. 06/15/20  Yes [provider]  ?omeprazole (PRILOSEC) 40 MG capsule Take 1 capsule (40 mg total) by mouth in the morning and at bedtime. 09/26/21  Yes Thurnell Lose, MD  ?predniSONE (DELTASONE) 10 MG tablet Take 4 tablets (40 mg total) by mouth daily for 3 days, THEN 3 tablets (30 mg total) daily for 3 days, THEN 2 tablets (20 mg total) daily for 3 days, THEN 1 tablet (10 mg total) daily for 3 days. 11/01/21 11/13/21 Yes Little Ishikawa, MD  ?   ? ?Allergies    ?Codeine, Nsaids, and Ceftriaxone   ? ?Review of Systems   ?Review of Systems  ?Constitutional:  Negative for fever.  ? ?Physical Exam ?Updated Vital Signs ?BP 118/68   Pulse 69   Temp (!) 94.5 ?F (34.7 ?C) (Rectal)   Resp 18   Ht 1.626 m (5\' 4" )   Wt 58.4 kg   SpO2 100%   BMI 22.10 kg/m?  ?Physical Exam ?Vitals and nursing note reviewed.  ?Constitutional:   ?   General: She is not in acute distress. ?   Appearance: She is well-developed. She is not diaphoretic.  ?HENT:  ?   Head: Normocephalic and atraumatic.  ?   Right Ear: External ear normal.  ?   Left Ear: External  ear normal.  ?Eyes:  ?   General: No scleral icterus.    ?   Right eye: No discharge.     ?   Left eye: No discharge.  ?   Conjunctiva/sclera: Conjunctivae normal.  ?Neck:  ?   Trachea: No tracheal deviation.  ?Cardiovascular:  ?   Rate and Rhythm: Normal rate and regular rhythm.  ?Pulmonary:  ?   Effort: Pulmonary effort is normal. No respiratory distress.  ?   Breath sounds: Normal breath sounds. No stridor. No wheezing or rales.  ?Abdominal:  ?   General: Bowel sounds are normal. There is no distension.  ?   Palpations: Abdomen is soft.  ?   Tenderness: There is no abdominal tenderness. There is no guarding or rebound.  ?Musculoskeletal:     ?   General: No tenderness or deformity.  ?   Cervical back: Neck supple.   ?Skin: ?   General: Skin is warm and dry.  ?   Findings: No rash.  ?Neurological:  ?   General: No focal deficit present.  ?   GCS: GCS eye subscore is 4. GCS verbal subscore is 4. GCS motor subscore is 6.  ?   Cranial Nerves: No cranial nerve deficit (no facial droop, extraocular movements intact, no slurred speech).  ?   Sensory: No sensory deficit.  ?   Motor: No abnormal muscle tone or seizure activity.  ?   Coordination: Coordination normal.  ?   Comments: Listless, generalized weakness,  ?Psychiatric:     ?   Mood and Affect: Mood normal.  ? ? ?ED Results / Procedures / Treatments   ?Labs ?(all labs ordered are listed, but only abnormal results are displayed) ?Labs Reviewed  ?PROTIME-INR - Abnormal; Notable for the following components:  ?    Result Value  ? Prothrombin Time 17.5 (*)   ? INR 1.4 (*)   ? All other components within normal limits  ?CBC - Abnormal; Notable for the following components:  ? RBC 3.83 (*)   ? MCV 108.4 (*)   ? MCH 34.7 (*)   ? All other components within normal limits  ?DIFFERENTIAL - Abnormal; Notable for the following components:  ? Lymphs Abs 0.4 (*)   ? All other components within normal limits  ?COMPREHENSIVE METABOLIC PANEL - Abnormal; Notable for the following components:  ? Glucose, Bld 108 (*)   ? Albumin 2.8 (*)   ? AST 141 (*)   ? ALT 88 (*)   ? Alkaline Phosphatase 139 (*)   ? Total Bilirubin 4.2 (*)   ? All other components within normal limits  ?RAPID URINE DRUG SCREEN, HOSP PERFORMED - Abnormal; Notable for the following components:  ? Tetrahydrocannabinol POSITIVE (*)   ? All other components within normal limits  ?URINALYSIS, ROUTINE W REFLEX MICROSCOPIC - Abnormal; Notable for the following components:  ? Color, Urine STRAW (*)   ? All other components within normal limits  ?AMMONIA - Abnormal; Notable for the following components:  ? Ammonia 53 (*)   ? All other components within normal limits  ?BLOOD GAS, VENOUS - Abnormal; Notable for the following components:   ? pO2, Ven <31 (*)   ? Bicarbonate 32.1 (*)   ? Acid-Base Excess 5.8 (*)   ? All other components within normal limits  ?I-STAT CHEM 8, ED - Abnormal; Notable for the following components:  ? BUN 28 (*)   ? Glucose, Bld 108 (*)   ? Calcium, Ion 1.06 (*)   ?  All other components within normal limits  ?RESP PANEL BY RT-PCR (FLU A&B, COVID) [id number]  ?ETHANOL  ?APTT  ?TSH  ? ? ?EKG ?EKG Interpretation ? ?Date/Time:  Wednesday November 03 2021 10:55:50 EDT ?Ventricular Rate:  61 ?PR Interval:  154 ?QRS Duration: 107 ?QT Interval:  473 ?QTC Calculation: 477 ?R Axis:   64 ?Text Interpretation: Sinus rhythm Low voltage, extremity and precordial leads Since last tracing rate slower Confirmed by Dorie Rank [phone]) on 11/03/2021 12:16:55 PM ? ?Radiology ?DG Chest 1 View ? ?Result Date: 11/03/2021 ?CLINICAL DATA:  Altered mental status.  Foul-smelling urine. EXAM: CHEST  1 VIEW COMPARISON:  10/30/2021 FINDINGS: Patient is rotated to the right. Circumferential pleural thickening and volume loss in the right hemithorax is similar to prior with soft tissue fullness in the right hilum and right base collapse/consolidative opacity with pleural effusion. Left lung remains clear. The visualized bony structures of the thorax are unremarkable. IMPRESSION: Right base collapse/consolidative opacity with right pleural effusion, similar to prior. Electronically Signed   By: Misty Stanley M.D.   On: 11/03/2021 13:40  ? ?CT HEAD WO CONTRAST ? ?Result Date: 11/03/2021 ?CLINICAL DATA:  Altered mental status EXAM: CT HEAD WITHOUT CONTRAST TECHNIQUE: Contiguous axial images were obtained from the base of the skull through the vertex without intravenous contrast. RADIATION DOSE REDUCTION: This exam was performed according to the departmental dose-optimization program which includes automated exposure control, adjustment of the mA and/or kV according to patient size and/or use of iterative reconstruction technique. COMPARISON:  MR brain done on  01/28/2019 FINDINGS: Brain: No acute intracranial findings are seen in the noncontrast CT brain. Ventricles are unremarkable. There is no shift of midline structures. Cortical sulci are prominent. Vascular: Unremarkable. [place]

## 2021-11-03 NOTE — Hospital Course (Addendum)
66 year old female with multiple recent hospitalizations, past medical history significant for alcoholic liver cirrhosis with esophageal varices, history of GI bleeding, lung cancer status post right lobectomy, hypertension, hyperlipidemia, type 2 diabetes mellitus, hypothyroidism recently discharged from the hospital with COPD exacerbation and A-fib RVR.  Patient apparently was staying with her sister for the past several days and they found her to be obtunded this morning with acute change in mental status and brought her to the emergency department for further evaluation. ? ?She has had a recent admission in early February for upper GI bleeding with hematemesis.  She had EGD on 09/24/2021 showing LA grade D esophagitis with no bleeding.  She has portal hypertensive gastropathy few gastric polyps and a single nonbleeding angioectasia in the duodenum.  She was treated with APC and was placed on PPI for 8 weeks.  She was also treated with SBP prophylaxis for 1 week.  She has had recurrent paroxysmal atrial fibrillation with RVR managed with medications. ? ?Her UDS tested positive for marijuana.  She reportedly is no longer using alcohol however sister reported that alcohol is in the home. ? ?11/04/2021: mentation slowly improving but not back to baseline.  Taking lactulose.  Having bowel movements. PT recommending SNF. TOC consulted for SNF placement. SLP eval and Nutrition eval pending.  ? ?11/05/2021:  Pt less confused, eating and drinking better.   SNF bed offer to take patient tomorrow for rehab.   ? ?11/06/2021:  Pt remains stable.  Pt discharging to SNF today.   ?

## 2021-11-03 NOTE — ED Triage Notes (Signed)
Pt staying with sister. Sister woke up this morning to find the patient in a different bed, confused and scared. Last seen normal before bed last night. Pt d/c from [facility] on Sunday-admitted for a fib. Sister reports dark and foul smelling urine. Alert and oriented to person only.  ?

## 2021-11-03 NOTE — H&P (Signed)
?History and Physical  ?[place] ? ?Sandy [name] GLO:[id number] DOB: 1956-04-03 DOA: 11/03/2021 ? ?PCP: Sandy Downing, MD  ?Patient coming from: Home (sister's house) ?Level of care: Med-Surg ? ?I have personally briefly reviewed patient's old medical records in [place] ? ?Chief Complaint: altered mental status  ? ?HPI: Sandy [name] is a 66 year old female with multiple recent hospitalizations, past medical history significant for alcoholic liver cirrhosis with esophageal varices, history of GI bleeding, lung cancer status post right lobectomy, hypertension, hyperlipidemia, type 2 diabetes mellitus, hypothyroidism recently discharged from the hospital with COPD exacerbation and A-fib RVR.  Patient apparently was staying with her sister for the past several days and they found her to be obtunded this morning with acute change in mental status and brought her to the emergency department for further evaluation. ? ?She has had a recent admission in early February for upper GI bleeding with hematemesis.  She had EGD on 09/24/2021 showing LA grade D esophagitis with no bleeding.  She has portal hypertensive gastropathy few gastric polyps and a single nonbleeding angioectasia in the duodenum.  She was treated with APC and was placed on PPI for 8 weeks.  She was also treated with SBP prophylaxis for 1 week.  She has had recurrent paroxysmal atrial fibrillation with RVR managed with medications. ? ?Her UDS tested positive for marijuana.  She reportedly is no longer using alcohol however sister reported that alcohol is in the home. ? ?Review of Systems: Review of Systems  ?Unable to perform ROS: Mental status change  ?  ?Past Medical History:  ?Diagnosis Date  ? Adenocarcinoma of lung, stage 3, right ([facility] [facility]) 2020  ? Arthritis   ? Asthma   ? Cirrhosis ([place])   ? COPD (chronic obstructive pulmonary disease) ([place])   ? Dyslipidemia   ? Dyspnea   ? Esophageal varices ([facility])   ? History of blood transfusion    ? History of hiatal hernia   ? Hypertension   ? Malignant neoplasm of bronchus of lower lobe, right ([place]) 01/10/2019  ? Pneumonia   ? Type 2 diabetes mellitus ([place])   ? Type II  ? ? ?Past Surgical History:  ?Procedure Laterality Date  ? ABDOMINAL HYSTERECTOMY    ? ANTERIOR CRUCIATE LIGAMENT REPAIR Left   ? x 2  ? ESOPHAGOGASTRODUODENOSCOPY    ? ESOPHAGOGASTRODUODENOSCOPY Left 09/24/2021  ? Procedure: ESOPHAGOGASTRODUODENOSCOPY (EGD);  Surgeon: Sharyn Creamer, MD;  Location: [facility];  Service: Gastroenterology;  Laterality: Left;  ? HEMOSTASIS CONTROL  09/24/2021  ? Procedure: HEMOSTASIS CONTROL;  Surgeon: Sharyn Creamer, MD;  Location: [facility];  Service: Gastroenterology;;  ? LOBECTOMY Right 01/07/2019  ? Procedure: RIGHT LOWER LOBE LOBECTOMY AND INTERCOSTAL NERVE BLOCK;  Surgeon: Melrose Nakayama, MD;  Location: [facility];  Service: Thoracic;  Laterality: Right;  ? VIDEO ASSISTED THORACOSCOPY (VATS)/WEDGE RESECTION Right 01/07/2019  ? Procedure: VIDEO ASSISTED THORACOSCOPY (VATS) RIGHT LOWER LOBE WEDGE RESECTION;  Surgeon: Melrose Nakayama, MD;  Location: [facility];  Service: Thoracic;  Laterality: Right;  ? ? ? reports that she quit smoking about 7 years ago. Her smoking use included cigarettes. She has a 37.00 pack-year smoking history. She has never used smokeless tobacco. She reports that she does not currently use alcohol after a past usage of about 1.0 standard drink per week. She reports that she does not currently use drugs after having used the following drugs: Marijuana. ? ?Allergies  ?Allergen Reactions  ? Codeine Nausea And Vomiting  ?  Nsaids   ?  Avoid due to esophageal varices  ? Ceftriaxone Rash  ? ? ?Family History  ?Problem Relation Age of Onset  ? Leukemia Mother   ? Parkinson's disease Father   ? Hypertension Sister   ? Lung disease Neg Hx   ? ? ?Prior to Admission medications   ?Medication Sig Start Date End Date Taking? Authorizing Provider  ?atorvastatin (LIPITOR) 10 MG tablet Take 10 mg  by mouth at bedtime.    Yes [provider]  ?diltiazem (CARDIZEM CD) 120 MG 24 hr capsule Take 1 capsule (120 mg total) by mouth daily. 11/01/21 11/01/22 Yes Little Ishikawa, MD  ?FeFum-FePoly-FA-B Cmp-C-Biot (INTEGRA PLUS) CAPS TAKE 1 CAPSULE BY MOUTH DAILY ?Patient taking differently: Take 1 capsule by mouth every evening. 06/28/21  Yes Curt Bears, MD  ?levothyroxine (SYNTHROID) 50 MCG tablet Take 50 mcg by mouth daily before breakfast.   Yes [provider]  ?metFORMIN (GLUCOPHAGE-XR) 500 MG 24 hr tablet Take 500 mg by mouth at bedtime.   Yes [provider]  ?nadolol (CORGARD) 20 MG tablet Take 20 mg by mouth every evening. 06/15/20  Yes [provider]  ?omeprazole (PRILOSEC) 40 MG capsule Take 1 capsule (40 mg total) by mouth in the morning and at bedtime. 09/26/21  Yes Thurnell Lose, MD  ?predniSONE (DELTASONE) 10 MG tablet Take 4 tablets (40 mg total) by mouth daily for 3 days, THEN 3 tablets (30 mg total) daily for 3 days, THEN 2 tablets (20 mg total) daily for 3 days, THEN 1 tablet (10 mg total) daily for 3 days. 11/01/21 11/13/21 Yes Little Ishikawa, MD  ? ? ?Physical Exam: ?Vitals:  ? 11/03/21 1409 11/03/21 1555 11/03/21 1614 11/03/21 1626  ?BP: 118/68 124/66  (!) 143/67  ?Pulse: 69 67  65  ?Resp: 18 18  20   ?Temp:  (!) 97.5 ?F (36.4 ?C)    ?TempSrc:  Oral    ?SpO2: 100% 100%  100%  ?Weight:   55.2 kg   ?Height:   5\' 3"  (1.6 m)   ? ? ?Constitutional: frail, emaciated female, slightly confused about place, time, but oriented to person, curled in fetal position, no distress.  ?Eyes: PERRL, lids and conjunctivae normal ?ENMT: Mucous membranes are dry. Posterior pharynx clear of any exudate or lesions.Normal dentition.  ?Neck: normal, supple, no masses, no thyromegaly ?Respiratory: clear to auscultation bilaterally, no wheezing, no crackles. Normal respiratory effort. No accessory muscle use.  ?Cardiovascular: normal s1, s2 sounds, no murmurs / rubs /  gallops. No extremity edema. 2+ pedal pulses. No carotid bruits.  ?Abdomen: no tenderness, no masses palpated. No hepatosplenomegaly. Bowel sounds positive.  ?Musculoskeletal: no clubbing / cyanosis. No joint deformity upper and lower extremities. Good ROM, no contractures. Normal muscle tone.  ?Skin: no rashes, lesions, ulcers. No induration ?Neurologic: CN 2-12 grossly intact. Sensation intact, DTR normal. Strength 5/5 in all 4.  ?Psychiatric: Poor judgment and insight. Alert and oriented x 3. Normal mood.  ? ?Labs on Admission: I have personally reviewed following labs and imaging studies ? ?CBC: ?Recent Labs  ?Lab 10/29/21 ?1611 10/29/21 ?2224 10/30/21 ?0407 10/30/21 ?2342 11/01/21 ?1610 11/03/21 ?1205 11/03/21 ?1232  ?WBC 4.0  --  3.3* 9.1 6.7 4.4  --   ?NEUTROABS 2.7  --   --   --   --  3.4  --   ?HGB 12.1   < > 11.7* 10.7* 10.5* 13.3 13.3  ?HCT 37.7   < > 36.2 32.6* 31.8* 41.5  39.0  ?MCV 107.4*  --  106.8* 105.2* 105.3* 108.4*  --   ?PLT 216  --  234 289 241 242  --   ? < > = values in this interval not displayed.  ? ?Basic Metabolic Panel: ?Recent Labs  ?Lab 10/29/21 ?1611 10/29/21 ?2224 10/30/21 ?2342 11/01/21 ?7654 11/03/21 ?1205 11/03/21 ?1232  ?NA 135 136 131* 128* 137 139  ?K 3.8 3.6 4.1 4.6 3.7 3.7  ?CL 100  --  101 97* 100 100  ?CO2 26  --  21* 22 27  --   ?GLUCOSE 120*  --  179* 198* 108* 108*  ?BUN 14  --  21 28* 23 28*  ?CREATININE 0.72  --  0.98 1.06* 0.67 0.60  ?CALCIUM 8.7*  --  8.7* 8.6* 8.9  --   ? ?GFR: ?Estimated Creatinine Clearance: 58 mL/min (by C-G formula based on SCr of 0.6 mg/dL). ?Liver Function Tests: ?Recent Labs  ?Lab 10/29/21 ?1611 11/03/21 ?1205  ?AST 42* 141*  ?ALT 22 88*  ?ALKPHOS 121 139*  ?BILITOT 4.6* 4.2*  ?PROT 6.8 7.5  ?ALBUMIN 2.4* 2.8*  ? ?No results for input(s): LIPASE, AMYLASE in the last 168 hours. ?Recent Labs  ?Lab 11/03/21 ?1207  ?AMMONIA 53*  ? ?Coagulation Profile: ?Recent Labs  ?Lab 11/03/21 ?1205  ?INR 1.4*  ? ?Cardiac Enzymes: ?No results for input(s):  CKTOTAL, CKMB, CKMBINDEX, TROPONINI in the last 168 hours. ?BNP (last 3 results) ?No results for input(s): PROBNP in the last 8760 hours. ?HbA1C: ?No results for input(s): HGBA1C in the last 72 hours. ?CBG: ?N

## 2021-11-03 NOTE — Assessment & Plan Note (Addendum)
Carb modified diet  ?Resume home metformin. ?

## 2021-11-03 NOTE — Progress Notes (Signed)
Family requested to speak with MD regarding patient. MD Wynetta Emery made aware. Contacted family via patients room phone.  ?

## 2021-11-03 NOTE — Assessment & Plan Note (Addendum)
Hemoglobin stable from recent testing ?

## 2021-11-03 NOTE — Assessment & Plan Note (Addendum)
Disease appears to be worsening now with rising ammonia levels and increasing confusion and encephalopathy.  We are starting her on lactulose with goal of 3 bowel movements per day.  Ambulatory referral to GI for cirrhosis management.  ?

## 2021-11-04 DIAGNOSIS — I10 Essential (primary) hypertension: Secondary | ICD-10-CM | POA: Diagnosis not present

## 2021-11-04 DIAGNOSIS — K7030 Alcoholic cirrhosis of liver without ascites: Secondary | ICD-10-CM | POA: Diagnosis not present

## 2021-11-04 DIAGNOSIS — E039 Hypothyroidism, unspecified: Secondary | ICD-10-CM

## 2021-11-04 DIAGNOSIS — Z8719 Personal history of other diseases of the digestive system: Secondary | ICD-10-CM | POA: Diagnosis not present

## 2021-11-04 DIAGNOSIS — E876 Hypokalemia: Secondary | ICD-10-CM | POA: Diagnosis not present

## 2021-11-04 DIAGNOSIS — K7682 Hepatic encephalopathy: Secondary | ICD-10-CM | POA: Diagnosis not present

## 2021-11-04 LAB — CBC
HCT: 37.1 % (ref 36.0–46.0)
Hemoglobin: 11.5 g/dL — ABNORMAL LOW (ref 12.0–15.0)
MCH: 32.8 pg (ref 26.0–34.0)
MCHC: 31 g/dL (ref 30.0–36.0)
MCV: 105.7 fL — ABNORMAL HIGH (ref 80.0–100.0)
Platelets: 160 10*3/uL (ref 150–400)
RBC: 3.51 MIL/uL — ABNORMAL LOW (ref 3.87–5.11)
RDW: 14.5 % (ref 11.5–15.5)
WBC: 4 10*3/uL (ref 4.0–10.5)
nRBC: 0 % (ref 0.0–0.2)

## 2021-11-04 LAB — GLUCOSE, CAPILLARY
Glucose-Capillary: 148 mg/dL — ABNORMAL HIGH (ref 70–99)
Glucose-Capillary: 143 mg/dL — ABNORMAL HIGH (ref 70–99)
Glucose-Capillary: 141 mg/dL — ABNORMAL HIGH (ref 70–99)
Glucose-Capillary: 319 mg/dL — ABNORMAL HIGH (ref 70–99)
Glucose-Capillary: 261 mg/dL — ABNORMAL HIGH (ref 70–99)

## 2021-11-04 LAB — COMPREHENSIVE METABOLIC PANEL
ALT: 75 U/L — ABNORMAL HIGH (ref 0–44)
AST: 92 U/L — ABNORMAL HIGH (ref 15–41)
Albumin: 2.3 g/dL — ABNORMAL LOW (ref 3.5–5.0)
Alkaline Phosphatase: 120 U/L (ref 38–126)
Anion gap: 9 (ref 5–15)
BUN: 18 mg/dL (ref 8–23)
CO2: 25 mmol/L (ref 22–32)
Calcium: 7.8 mg/dL — ABNORMAL LOW (ref 8.9–10.3)
Chloride: 101 mmol/L (ref 98–111)
Creatinine, Ser: 0.57 mg/dL (ref 0.44–1.00)
GFR, Estimated: >60 mL/min (ref >60)
Glucose, Bld: 171 mg/dL — ABNORMAL HIGH (ref 70–99)
Potassium: 3 mmol/L — ABNORMAL LOW (ref 3.5–5.1)
Sodium: 135 mmol/L (ref 135–145)
Total Bilirubin: 3.8 mg/dL — ABNORMAL HIGH (ref 0.3–1.2)
Total Protein: 5.8 g/dL — ABNORMAL LOW (ref 6.5–8.1)

## 2021-11-04 LAB — AMMONIA: Ammonia: 42 umol/L — ABNORMAL HIGH (ref 9–35)

## 2021-11-04 LAB — MAGNESIUM: Magnesium: 1.8 mg/dL (ref 1.7–2.4)

## 2021-11-04 MED ORDER — INSULIN GLARGINE-YFGN 100 UNIT/ML ~~LOC~~ SOLN
10.0000 [IU] | Freq: Every day | SUBCUTANEOUS | Status: DC
  Administered 2021-11-04 – 2021-11-05 (×2): 10 [IU] via SUBCUTANEOUS
  Filled 2021-11-04 (×3): qty 0.1

## 2021-11-04 MED ORDER — POTASSIUM CHLORIDE CRYS ER 20 MEQ PO TBCR
40.0000 meq | EXTENDED_RELEASE_TABLET | Freq: Three times a day (TID) | ORAL | Status: AC
Start: 2021-11-04 — End: 2021-11-04
  Administered 2021-11-04 (×2): 40 meq via ORAL
  Filled 2021-11-04 (×2): qty 2

## 2021-11-04 MED ORDER — PREDNISONE 10 MG PO TABS
10.0000 mg | ORAL_TABLET | Freq: Every day | ORAL | Status: DC
Start: 2021-11-05 — End: 2021-11-06
  Administered 2021-11-06: 10 mg via ORAL
  Filled 2021-11-04 (×2): qty 1

## 2021-11-04 MED ORDER — MAGNESIUM SULFATE 4 GM/100ML IV SOLN
4.0000 g | Freq: Once | INTRAVENOUS | Status: AC
Start: 2021-11-04 — End: 2021-11-04
  Administered 2021-11-04: 4 g via INTRAVENOUS
  Filled 2021-11-04: qty 100

## 2021-11-04 MED ORDER — ENSURE ENLIVE PO LIQD
237.0000 mL | Freq: Three times a day (TID) | ORAL | Status: DC
  Administered 2021-11-04 – 2021-11-06 (×3): 237 mL via ORAL

## 2021-11-04 NOTE — [place] FL2 (Signed)
?[place] MEDICAID FL2 LEVEL OF CARE SCREENING TOOL  ?  ? ?IDENTIFICATION  ?Patient Name: ?Sandy [name] Birthdate: [birth year] Sex: female Admission Date (Current Location): ?11/03/2021  ?[place] and [place] Number: ? [place] and Address:  ?[place] [address], [place] ?     Provider Number: ?[id number]  ?Attending Physician Name and Address:  ?Murlean Iba, MD ? Relative Name and Phone Number:  ?[name] sister [phone] ?   ?Current Level of Care: ?Hospital Recommended Level of Care: ?[place] Prior Approval Number: ?  ? ?Date Approved/Denied: ?  PASRR Number: ?[id number] ? ?Discharge Plan: ?SNF ?  ? ?Current Diagnoses: ?Patient Active Problem List  ? Diagnosis Date Noted  ? Acute hepatic encephalopathy 11/03/2021  ? Atrial fibrillation with RVR ([place]) 10/29/2021  ? Hypothyroidism 10/29/2021  ? Hematemesis 09/23/2021  ? History of lung cancer 09/23/2021  ? Paroxysmal atrial fibrillation (H[facility])   ? Iron deficiency anemia secondary to blood loss (chronic) 06/18/2021  ? Chronic cough 06/15/2020  ? Upper airway cough syndrome 05/07/2020  ? 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 DOE (dyspnea on exertion) 11/06/2019  ? Essential hypertension 11/06/2019  ? Anemia 02/12/2019  ? Goals of care, counseling/discussion 01/17/2019  ? S/P lobectomy of lung 01/07/2019  ? Type 2 diabetes mellitus ([place]) 12/18/2018  ? Solitary pulmonary nodule on lung CT 12/03/2018  ? Alcoholic cirrhosis ([place]) 48/18/5631  ? History of esophageal varices 10/18/2017  ? De Quervain's tenosynovitis, right 04/17/2015  ? ? ?Orientation RESPIRATION BLADDER Height & Weight   ?  ?Self ? Normal Continent Weight: 55.2 kg ?Height:  5\' 3"  (160 cm)  ?BEHAVIORAL SYMPTOMS/MOOD NEUROLOGICAL BOWEL NUTRITION STATUS  ?    Continent Diet (See DC summary)  ?AMBULATORY STATUS COMMUNICATION OF NEEDS Skin   ?Extensive Assist Verbally Skin abrasions, Bruising ?  ?  ?  ?    ?     ?      ? ? ?Personal Care Assistance Level of Assistance  ?Bathing, Feeding, Dressing Bathing Assistance: Limited assistance ?Feeding assistance: Independent ?Dressing Assistance: Limited assistance ?   ? ?Functional Limitations Info  ?Sight, Hearing, Speech Sight Info: Impaired ?Hearing Info: Adequate ?Speech Info: Adequate  ? ? ?SPECIAL CARE FACTORS FREQUENCY  ?PT (By licensed PT)   ?  ?PT Frequency: 5 times a week ?  ?  ?  ?  ?   ? ? ?Contractures Contractures Info: Not present  ? ? ?Additional Factors Info  ?Code Status, Allergies Code Status Info: Full ?Allergies Info: Codeine, Nsaids, Ceftriazone ?  ?  ?  ?   ? ?Current Medications (11/04/2021):  This is the current hospital active medication list ?Current Facility-Administered Medications  ?Medication Dose Route Frequency Provider Last Rate Last Admin  ? 0.9 %  sodium chloride infusion  100 mL/hr Intravenous Continuous Johnson, Clanford L, MD 100 mL/hr at 11/04/21 0643 100 mL/hr at 11/04/21 0643  ? acetaminophen (TYLENOL) tablet 325 mg  325 mg Oral Q6H PRN Johnson, Clanford L, MD      ? Or  ? acetaminophen (TYLENOL) suppository 325 mg  325 mg Rectal Q6H PRN Johnson, Clanford L, MD      ? atorvastatin (LIPITOR) tablet 10 mg  10 mg Oral QHS Johnson, Clanford L, MD   10 mg at 11/03/21 2119  ? diltiazem (CARDIZEM CD) 24 hr capsule 120 mg  120 mg Oral Daily Johnson, Clanford L, MD   120 mg at 11/04/21  5993  ? enoxaparin (LOVENOX) injection 40 mg  40 mg Subcutaneous Q24H Johnson, Clanford L, MD   40 mg at 11/03/21 2119  ? feeding supplement (ENSURE ENLIVE / ENSURE PLUS) liquid 237 mL  237 mL Oral TID BM Johnson, Clanford L, MD      ? folic acid (FOLVITE) tablet 1 mg  1 mg Oral Daily Johnson, Clanford L, MD   1 mg at 11/04/21 0943  ? insulin aspart (novoLOG) injection 0-9 Units  0-9 Units Subcutaneous TID WC Wynetta Emery, Clanford L, MD   1 Units at 11/04/21 1208  ? insulin aspart (novoLOG) injection 2 Units  2 Units Subcutaneous TID WC Johnson, Clanford L, MD   2 Units at  11/04/21 1208  ? lactulose (CHRONULAC) 10 GM/15ML solution 20 g  20 g Oral TID Wynetta Emery, Clanford L, MD   20 g at 11/04/21 0944  ? levothyroxine (SYNTHROID) tablet 50 mcg  50 mcg Oral QAC breakfast Wynetta Emery, Clanford L, MD   50 mcg at 11/04/21 5701  ? multivitamin with minerals tablet 1 tablet  1 tablet Oral Daily Wynetta Emery, Clanford L, MD   1 tablet at 11/04/21 0943  ? nadolol (CORGARD) tablet 20 mg  20 mg Oral QPM Johnson, Clanford L, MD   20 mg at 11/03/21 1819  ? pantoprazole (PROTONIX) EC tablet 40 mg  40 mg Oral BID Wynetta Emery, Clanford L, MD   40 mg at 11/04/21 0943  ? potassium chloride SA (KLOR-CON M) CR tablet 40 mEq  40 mEq Oral TID Wynetta Emery, Clanford L, MD   40 mEq at 11/04/21 0953  ? predniSONE (DELTASONE) tablet 20 mg  20 mg Oral Q breakfast Wynetta Emery, Clanford L, MD   20 mg at 11/04/21 0943  ? thiamine (B-1) injection 100 mg  100 mg Intravenous Daily Johnson, Clanford L, MD   100 mg at 11/04/21 7793  ? ? ? ?Discharge Medications: ?Please see discharge summary for a list of discharge medications. ? ?Relevant Imaging Results: ? ?Relevant Lab Results: ? ? ?Additional Information ?SS3 [id number] ? ?Boneta Lucks, RN ? ? ? ? ?

## 2021-11-04 NOTE — Plan of Care (Signed)

## 2021-11-04 NOTE — Assessment & Plan Note (Addendum)
Replacement given and repleted.   ?

## 2021-11-04 NOTE — Progress Notes (Signed)
Initial Nutrition Assessment ? ?DOCUMENTATION CODES:  ? ?Severe malnutrition in context of chronic illness ? ?INTERVENTION:  ?- Recommend ST evaluation to assess for appropriateness of diet and concern for cognition as it pertains to safe PO nutritional intake; reached out to MD with recommendation ? ?- Ensure Enlive po TID, each supplement provides 350 kcal and 20 grams of protein. ? ?- Continue MVI with minerals daily ? ?- If appropriate and unable to obtain adequate PO intake, would recommend enteral nutrition to improve nutritional adequacy ? ?NUTRITION DIAGNOSIS:  ? ?Severe Malnutrition related to chronic illness (alcoholic liver cirrhosis with esophageal varices, lung CA s/p lobectomy and chemotherapy) as evidenced by mild fat depletion, severe muscle depletion, energy intake < or equal to 75% for > or equal to 1 month, percent weight loss. ? ?GOAL:  ? ?Patient will meet greater than or equal to 90% of their needs ? ?MONITOR:  ? ?PO intake, Supplement acceptance, Diet advancement, Labs, Weight trends ? ?REASON FOR ASSESSMENT:  ? ?Consult ?Malnutrition Eval ? ?ASSESSMENT:  ? ?Pt admitted with acute hepatic encephalopathy. PMH significant for alcoholic liver cirrhosis with esophageal varices, h/o GI bleed, lung cancer s/p R lobectomy, HTN, HLD, T2DM, and hypothyroidism. Prior admission in February for upper GI bleed and again March with COPD exacerbation and A-fib RVR. ? ?Pt sitting up in chair, appears very lethargic and nodded yes when asked if she was feeling tired. Pt's sister present during assessment and provided most nutrition and weight history. She states that pt has consumed alcohol chronically and has eaten poorly for quite sometime. She had her teeth removed in September and was schedule to have a fitting for dentures this week. Pt has only been consuming Atkins shakes and few sips of soup since. Also of note, she reports that pt had chemotherapy for lung cancer in 2020 which also attributed to  decrease in PO intake. ? ?Spoke with RN, kitchen is sending house trays. Pt is eating very minimally and is only able to take medication if crushed. ? ?Would consider downgrading diet for ease of chewing however, given pt's poor PO intake for several months, will continue with dysphagia 3 diet to provide widest variety of menu options to encourage adequate PO intake. Reached out to MD for ST evaluation to assist in determination of diet downgrade recommendation and cognitive assessment as it pertains to safe nutritional intake. Given history, suspect pt may not be able to meet her needs via PO intake alone and would recommend enteral nutrition once appropriate and better oriented. ? ?Pt's sister reports usual weight of around 140 lbs. However she has been losing weight for some time. She states that at her most recent hospital admission, she weighed 126 lbs. Reviewed chart. Weight history shows steady decline within the last year. Noted a 9% weight loss from 2/5-3/15 which is a significant weight loss for time frame. ? ?Medications: folic acid, SSI 0-9 units TID, SSI 2 units TID, lactulose TID, synthroid, MVI, protonix, KLOR-CON, prednisone, thiamine, IV magnesium sulfate ? ?Labs: potassium 3.0, ionized calcium 1.06, AST 92, ALT 75, ammonia 42, HgbA1c 4.3%, CBG's 143-163 x24 hours ? ?NUTRITION - FOCUSED PHYSICAL EXAM: ? ?Flowsheet Row Most Recent Value  ?Orbital Region No depletion  ?Upper Arm Region Mild depletion  ?Thoracic and Lumbar Region Mild depletion  ?Buccal Region Moderate depletion  ?Temple Region Moderate depletion  ?Clavicle Bone Region Moderate depletion  ?Clavicle and Acromion Bone Region Mild depletion  ?Scapular Bone Region Mild depletion  ?Dorsal Hand Moderate depletion  ?  Patellar Region Severe depletion  ?Anterior Thigh Region Severe depletion  ?Posterior Calf Region Severe depletion  ?Edema (RD Assessment) None  ?Hair Reviewed  ?Eyes Reviewed  ?Mouth Other (Comment)  [poor dentition]  ?Skin Other  (Comment)  [jaundice]  ?Nails Reviewed  ? ?  ? ? ?Diet Order:   ?Diet Order   ? ?       ?  DIET DYS 3 Room service appropriate? Yes; Fluid consistency: Thin  Diet effective now       ?  ? ?  ?  ? ?  ? ? ?EDUCATION NEEDS:  ? ?Not appropriate for education at this time ? ?Skin:  Skin Assessment: Reviewed RN Assessment ? ?Last BM:  3/15 (type 7) ? ?Height:  ? ?Ht Readings from Last 1 Encounters:  ?11/03/21 5\' 3"  (1.6 m)  ? ? ?Weight:  ? ?Wt Readings from Last 1 Encounters:  ?11/03/21 55.2 kg  ? ?BMI:  Body mass index is 21.56 kg/m?. ? ?Estimated Nutritional Needs:  ? ?Kcal:  1600-1800 ? ?Protein:  80-95g ? ?Fluid:  >/=1.6L ? ?Clayborne Dana, RDN, LDN ?Clinical Nutrition ?

## 2021-11-04 NOTE — Assessment & Plan Note (Signed)
Resume home diltiazem for rate control and nadolol which seems to be controlling rate. She is not candidate for anticoagulation due to poor compliance, chronic thrombocytopenia, chronic liver disease and ongoing alcohol abuse.   ?

## 2021-11-04 NOTE — Plan of Care (Signed)
?  Problem: Acute Rehab PT Goals(only PT should resolve) ?Goal: Pt Will Go Supine/Side To Sit ?Outcome: Progressing ?Flowsheets (Taken 11/04/2021 1409) ?Pt will go Supine/Side to Sit: ? with modified independence ? with supervision ?Goal: Patient Will Transfer Sit To/From Stand ?Outcome: Progressing ?Flowsheets (Taken 11/04/2021 1409) ?Patient will transfer sit to/from stand: ? with modified independence ? with supervision ?Goal: Pt Will Transfer Bed To Chair/Chair To Bed ?Outcome: Progressing ?Flowsheets (Taken 11/04/2021 1409) ?Pt will Transfer Bed to Chair/Chair to Bed: ? with modified independence ? with supervision ?Goal: Pt Will Ambulate ?Outcome: Progressing ?Flowsheets (Taken 11/04/2021 1409) ?Pt will Ambulate: ? 100 feet ? with supervision ? with modified independence ? with least restrictive assistive device ?  ?2:10 PM, 11/04/21 ?Lonell Grandchild, MPT ?Physical Therapist with [place] ?[facility] ?[phone] office ?[phone] mobile phone ? ?

## 2021-11-04 NOTE — Progress Notes (Signed)
Patient unaware of safety , she ambulated to the restroom with gown soaked and the whole floor was drenched in urine , She removed telemetry and all telemetry stickers and threw them along with urine soiled gown in the floor. Cleaned up the urine off of floor , did perineal care on patient once she went back to bed,. And housekeeping mopped floor to ensure no smell noted. Educated patient on call bell use however she does not seem to understand at this time. Notified Dr. Wynetta Emery  that patient will not keep telemetry on at this time. Patient was able to take her lactulose with a straw and drink it and this writer crushed medication and dissolved potassium so patient could easily swallow ?

## 2021-11-04 NOTE — Evaluation (Signed)
Physical Therapy Evaluation ?Patient Details ?Name: Sandy [name] ?MRN: [id number] ?DOB: Mar 26, 1956 ?Today's Date: 11/04/2021 ? ?History of Present Illness ? Sandy [name] is a 66 year old female with multiple recent hospitalizations, past medical history significant for alcoholic liver cirrhosis with esophageal varices, history of GI bleeding, lung cancer status post right lobectomy, hypertension, hyperlipidemia, type 2 diabetes mellitus, hypothyroidism recently discharged from the hospital with COPD exacerbation and A-fib RVR.  Patient apparently was staying with her sister for the past several days and they found her to be obtunded this morning with acute change in mental status and brought her to the emergency department for further evaluation.     She has had a recent admission in early February for upper GI bleeding with hematemesis.  She had EGD on 09/24/2021 showing LA grade D esophagitis with no bleeding.  She has portal hypertensive gastropathy few gastric polyps and a single nonbleeding angioectasia in the duodenum.  She was treated with APC and was placed on PPI for 8 weeks.  She was also treated with SBP prophylaxis for 1 week.  She has had recurrent paroxysmal atrial fibrillation with RVR managed with medications. ?  ?Clinical Impression ? Patient presents slightly lethargic, became more alert after sitting up and able to follow directions appropriately.  Patient demonstrates labored movement for sitting up at bedside, has to lean on walls and bed rail during ambulation in room/hallway due to generalized weakness, required use of RW for safety demonstrating slow labored cadence without loss of balance and limited mostly due to fatigue.  Patient tolerated sitting up in chair after therapy - nursing staff notified.  Patient will benefit from continued skilled physical therapy in hospital and recommended venue below to increase strength, balance, endurance for safe ADLs and gait.  ?   ?    ? ?Recommendations for follow up therapy are one component of a multi-disciplinary discharge planning process, led by the attending physician.  Recommendations may be updated based on patient status, additional functional criteria and insurance authorization. ? ?Follow Up Recommendations Skilled nursing-short term rehab (<3 hours/day) ? ?  ?Assistance Recommended at Discharge Intermittent Supervision/Assistance  ?Patient can return home with the following ? A little help with walking and/or transfers;A little help with bathing/dressing/bathroom;Help with stairs or ramp for entrance;Assistance with cooking/housework ? ?  ?Equipment Recommendations Rolling walker (2 wheels)  ?Recommendations for Other Services ?    ?  ?Functional Status Assessment Patient has had a recent decline in their functional status and demonstrates the ability to make significant improvements in function in a reasonable and predictable amount of time.  ? ?  ?Precautions / Restrictions Precautions ?Precautions: Fall ?Restrictions ?Weight Bearing Restrictions: No  ? ?  ? ?Mobility ? Bed Mobility ?Overal bed mobility: Needs Assistance ?Bed Mobility: Supine to Sit ?  ?  ?Supine to sit: Min assist ?  ?  ?General bed mobility comments: slow labored movement ?  ? ?Transfers ?Overall transfer level: Needs assistance ?Equipment used: Rolling walker (2 wheels), None, 1 person hand held assist ?Transfers: Sit to/from Stand, Bed to chair/wheelchair/BSC ?Sit to Stand: Min assist ?  ?Step pivot transfers: Min assist ?  ?  ?  ?General transfer comment: unsteady labored movement, required use of RW for safety ?  ? ?Ambulation/Gait ?Ambulation/Gait assistance: Min assist ?Gait Distance (Feet): 45 Feet ?Assistive device: Rolling walker (2 wheels), 1 person hand held assist ?Gait Pattern/deviations: Decreased step length - right, Decreased step length - left, Decreased stride length ?Gait velocity: decreased ?  ?  ?  General Gait Details: unsteady labored  movement with frequent leaning on wall, side rails for support, required use of RW for safety demonstrating fair balance, slow labored cadence and limited mostly due to fatigue ? ?Stairs ?  ?  ?  ?  ?  ? ?Wheelchair Mobility ?  ? ?Modified Rankin (Stroke Patients Only) ?  ? ?  ? ?Balance Overall balance assessment: Needs assistance ?Sitting-balance support: Feet supported, No upper extremity supported ?Sitting balance-Leahy Scale: Fair ?Sitting balance - Comments: fair/good seated at EOB ?  ?Standing balance support: During functional activity, No upper extremity supported ?Standing balance-Leahy Scale: Poor ?Standing balance comment: fair/poor without AD, fair/good using RW ?  ?  ?  ?  ?  ?  ?  ?  ?  ?  ?  ?   ? ? ? ?Pertinent Vitals/Pain Pain Assessment ?Pain Assessment: No/denies pain  ? ? ?Home Living Family/patient expects to be discharged to:: Private residence ?Living Arrangements: Alone ?Available Help at Discharge: Family;Available PRN/intermittently ?  ?  ?  ?  ?  ?  ?  ?Additional Comments: Patient is poor historian  ?  ?Prior Function Prior Level of Function : Independent/Modified Independent ?  ?  ?  ?  ?  ?  ?Mobility Comments: Hydrographic surveyor, drives, "per patient" ?ADLs Comments: Independent, "per patient" ?  ? ? ?Hand Dominance  ?   ? ?  ?Extremity/Trunk Assessment  ? Upper Extremity Assessment ?Upper Extremity Assessment: Generalized weakness ?  ? ?Lower Extremity Assessment ?Lower Extremity Assessment: Generalized weakness ?  ? ?Cervical / Trunk Assessment ?Cervical / Trunk Assessment: Normal  ?Communication  ? Communication: No difficulties  ?Cognition Arousal/Alertness: Awake/alert ?Behavior During Therapy: [facility] for tasks assessed/performed ?Overall Cognitive Status: No family/caregiver present to determine baseline cognitive functioning ?  ?  ?  ?  ?  ?  ?  ?  ?  ?  ?  ?  ?  ?  ?  ?  ?  ?  ?  ? ?  ?General Comments   ? ?  ?Exercises    ? ?Assessment/Plan  ?  ?PT Assessment Patient needs  continued PT services  ?PT Problem List Decreased strength;Decreased activity tolerance;Decreased balance;Decreased mobility ? ?   ?  ?PT Treatment Interventions DME instruction;Gait training;Stair training;Functional mobility training;Therapeutic activities;Therapeutic exercise;Patient/family education;Balance training   ? ?PT Goals (Current goals can be found in the Care Plan section)  ?Acute Rehab PT Goals ?Patient Stated Goal: return home ?PT Goal Formulation: With patient ?Time For Goal Achievement: 11/18/21 ?Potential to Achieve Goals: Good ? ?  ?Frequency Min 3X/week ?  ? ? ?Co-evaluation   ?  ?  ?  ?  ? ? ?  ?AM-PAC PT "6 Clicks" Mobility  ?Outcome Measure Help needed turning from your back to your side while in a flat bed without using bedrails?: A Little ?Help needed moving from lying on your back to sitting on the side of a flat bed without using bedrails?: A Little ?Help needed moving to and from a bed to a chair (including a wheelchair)?: A Little ?Help needed standing up from a chair using your arms (e.g., wheelchair or bedside chair)?: A Little ?Help needed to walk in hospital room?: A Little ?Help needed climbing 3-5 steps with a railing? : A Lot ?6 Click Score: 17 ? ?  ?End of Session   ?Activity Tolerance: Patient tolerated treatment well;Patient limited by fatigue ?Patient left: in chair;with call bell/phone within reach;with chair alarm set ?Nurse Communication:  Mobility status ?PT Visit Diagnosis: Unsteadiness on feet (R26.81);Other abnormalities of gait and mobility (R26.89);Muscle weakness (generalized) (M62.81) ?  ? ?Time: 1001-1033 ?PT Time Calculation (min) (ACUTE ONLY): 32 min ? ? ?Charges:   PT Evaluation ?$PT Eval Moderate Complexity: 1 Mod ?PT Treatments ?$Therapeutic Activity: 23-37 mins ?  ?   ? ? ?2:08 PM, 11/04/21 ?Lonell Grandchild, MPT ?Physical Therapist with [place] ?[facility] ?[phone] office ?[phone] mobile phone ? ? ?

## 2021-11-04 NOTE — Progress Notes (Signed)
Patient refused insulin x 2. Her BS is 319. Notified Dr, Wynetta Emery  ? ?

## 2021-11-04 NOTE — Progress Notes (Signed)
?PROGRESS NOTE ? ? ?Sandy [name]  XKP:[id number] DOB: 04/01/1956 DOA: 11/03/2021 ?PCP: [name], MD  ? ?Chief Complaint  ?Patient presents with  ? Altered Mental Status  ? ?Level of care: Med-Surg ? ?Brief Admission History:  ?66 year old female with multiple recent hospitalizations, past medical history significant for alcoholic liver cirrhosis with esophageal varices, history of GI bleeding, lung cancer status post right lobectomy, hypertension, hyperlipidemia, type 2 diabetes mellitus, hypothyroidism recently discharged from the hospital with COPD exacerbation and A-fib RVR.  Patient apparently was staying with her sister for the past several days and they found her to be obtunded this morning with acute change in mental status and brought her to the emergency department for further evaluation. ? ?She has had a recent admission in early February for upper GI bleeding with hematemesis.  She had EGD on 09/24/2021 showing LA grade D esophagitis with no bleeding.  She has portal hypertensive gastropathy few gastric polyps and a single nonbleeding angioectasia in the duodenum.  She was treated with APC and was placed on PPI for 8 weeks.  She was also treated with SBP prophylaxis for 1 week.  She has had recurrent paroxysmal atrial fibrillation with RVR managed with medications. ? ?Her UDS tested positive for marijuana.  She reportedly is no longer using alcohol however sister reported that alcohol is in the home. ? ?11/04/2021: mentation slowly improving but not back to baseline.  Taking lactulose.  Having bowel movements. PT recommending SNF. TOC consulted for SNF placement. SLP eval and Nutrition eval pending.  ?  ?Assessment and Plan: ?* Acute hepatic encephalopathy ?Patient presents with obtunded mental status with elevated ammonia level ?Start lactulose with goal of 3 BMs per day. ?Recheck ammonia level in a.m. ?Continue other supportive measures as ordered. ? ?History of esophageal varices ?Patient  strongly advised to stop all alcohol consumption.  She reportedly still drinking alcohol.  ? ?Alcoholic cirrhosis ([place]) ?Disease appears to be worsening now with rising ammonia levels and increasing confusion and encephalopathy.  We are starting her on lactulose with goal of 3 bowel movements per day.  We will have her follow-up with her hepatologist/gastroenterologist for consideration of starting rifaximin in the outpatient setting.  Family reporting that patient still drinking alcohol.  ? ?Solitary pulmonary nodule on lung CT ?Patient is followed by [name] her oncologist with follow-up planned April 2023. ? ?Hypokalemia ?Replacement given today, recheck in AM ?Mg IV given, recheck in AM.  ? ?Protein-calorie malnutrition, severe ?Dietician consultation requested.  SLP recommended.  Dysphagia 3 diet for now.  ? ?Hypothyroidism ?Resume home medication. ? ?Paroxysmal atrial fibrillation ([place]) ?Resume home diltiazem for rate control and nadolol which seems to be controlling rate. She is not candidate for anticoagulation due to poor compliance, chronic thrombocytopenia, chronic liver disease and ongoing alcohol abuse.   ? ?History of lung cancer ?Patient is status post right lumpectomy. ?She had a recent CTA chest done at [facility] showing increased consolidation to the right apex with follow-up planned with her oncologist [name].  Consult to palliative care requested.  ? ?Essential hypertension ?We plan to resume all of her home blood pressure medications. ? ?DOE (dyspnea on exertion) ?Likely secondary to right lobectomy.  Currently reported stable. ? ?Anemia ?Hemoglobin stable from recent testing we will follow ? ?S/P lobectomy of lung ?Follow-up with [name] her oncologist for ongoing surveillance ? ?Type 2 diabetes mellitus ([place]) ?Carb modified diet with SSI coverage ordered ?Holding home metformin. ? ?DVT prophylaxis: scd  ?  Code Status: full  ?Family Communication: son updated 3/15  telephone ?Disposition: Status is: Inpatient ?Remains inpatient appropriate because: IV medications required, SNF placement  ?  ?Consultants:  ?Dietitian ?SLP ?Procedures:  ? ?Antimicrobials:  ?  ?Subjective: ?Pt remains confused but less than yesterday, poor oral intake but is taking medications.  ?Objective: ?Vitals:  ? 11/03/21 1736 11/03/21 1817 11/03/21 2259 11/04/21 1308  ?BP:  140/70 (!) 98/56 109/62  ?Pulse:   63 (!) 55  ?Resp:   17 16  ?Temp: (!) 97.4 ?F (36.3 ?C)  97.7 ?F (36.5 ?C)   ?TempSrc: Oral  Oral   ?SpO2:   100% 100%  ?Weight:      ?Height:      ? ? ?Intake/Output Summary (Last 24 hours) at 11/04/2021 1716 ?Last data filed at 11/04/2021 1500 ?Gross per 24 hour  ?Intake 2326.71 ml  ?Output --  ?Net 2326.71 ml  ? ?Filed Weights  ? 11/03/21 1048 11/03/21 1614  ?Weight: 58.4 kg 55.2 kg  ? ?Examination: ? ?General exam: frail emaciated chronically ill appearing, pale, Appears calm and comfortable  ?Respiratory system: Clear to auscultation. Respiratory effort normal. ?Cardiovascular system: normal S1 & S2 heard. No JVD, murmurs, rubs, gallops or clicks. No pedal edema. ?Gastrointestinal system: Abdomen is nondistended, soft and nontender. No organomegaly or masses felt. Normal bowel sounds heard. ?Central nervous system: Alert and oriented to person, place,  No focal neurological deficits. ?Extremities: Symmetric 5 x 5 power. ?Skin: No rashes, lesions or ulcers. ?Psychiatry: Judgement and insight appear poor. Mood & affect flat.  ? ?Data Reviewed: I have personally reviewed following labs and imaging studies ? ?CBC: ?Recent Labs  ?Lab 10/29/21 ?1611 10/29/21 ?2224 10/30/21 ?0407 10/30/21 ?2342 11/01/21 ?0175 11/03/21 ?1205 11/03/21 ?1232 11/04/21 ?1025  ?WBC 4.0  --  3.3* 9.1 6.7 4.4  --  4.0  ?NEUTROABS 2.7  --   --   --   --  3.4  --   --   ?HGB 12.1   < > 11.7* 10.7* 10.5* 13.3 13.3 11.5*  ?HCT 37.7   < > 36.2 32.6* 31.8* 41.5 39.0 37.1  ?MCV 107.4*  --  106.8* 105.2* 105.3* 108.4*  --  105.7*  ?PLT  216  --  234 289 241 242  --  160  ? < > = values in this interval not displayed.  ? ? ?Basic Metabolic Panel: ?Recent Labs  ?Lab 10/29/21 ?1611 10/29/21 ?2224 10/30/21 ?2342 11/01/21 ?8527 11/03/21 ?1205 11/03/21 ?1232 11/04/21 ?7824  ?NA 135   < > 131* 128* 137 139 135  ?K 3.8   < > 4.1 4.6 3.7 3.7 3.0*  ?CL 100  --  101 97* 100 100 101  ?CO2 26  --  21* 22 27  --  25  ?GLUCOSE 120*  --  179* 198* 108* 108* 171*  ?BUN 14  --  21 28* 23 28* 18  ?CREATININE 0.72  --  0.98 1.06* 0.67 0.60 0.57  ?CALCIUM 8.7*  --  8.7* 8.6* 8.9  --  7.8*  ?MG  --   --   --   --   --   --  1.8  ? < > = values in this interval not displayed.  ? ? ?CBG: ?Recent Labs  ?Lab 11/03/21 ?2257 11/04/21 ?0257 11/04/21 ?2353 11/04/21 ?1132  ?GLUCAP 163* 148* 143* 141*  ? ? ?Recent Results (from the past 240 hour(s))  ?Resp Panel by RT-PCR (Flu A&B, Covid) Nasopharyngeal Swab     Status:  None  ? Collection Time: 10/29/21 10:13 PM  ? Specimen: Nasopharyngeal Swab; Nasopharyngeal(NP) swabs in vial transport medium  ?Result Value Ref Range Status  ? SARS Coronavirus 2 by RT PCR NEGATIVE NEGATIVE Final  ?  Comment: (NOTE) ?SARS-CoV-2 target nucleic acids are NOT DETECTED. ? ?The SARS-CoV-2 RNA is generally detectable in upper respiratory ?specimens during the acute phase of infection. The lowest ?concentration of SARS-CoV-2 viral copies this assay can detect is ?138 copies/mL. A negative result does not preclude SARS-Cov-2 ?infection and should not be used as the sole basis for treatment or ?other patient management decisions. A negative result may occur with  ?improper specimen collection/handling, submission of specimen other ?than nasopharyngeal swab, presence of viral mutation(s) within the ?areas targeted by this assay, and inadequate number of viral ?copies(<138 copies/mL). A negative result must be combined with ?clinical observations, patient history, and epidemiological ?information. The expected result is Negative. ? ?Fact Sheet for  Patients:  ?EntrepreneurPulse.com.au ? ?Fact Sheet for Healthcare Providers:  ?IncredibleEmployment.be ? ?This test is no t yet approved or cleared by the [place] FDA and  ?has

## 2021-11-04 NOTE — Evaluation (Signed)
Clinical/Bedside Swallow Evaluation ?Patient Details  ?Name: Sandy [name] ?MRN: [id number] ?Date of Birth: 08-Oct-1955 ? ?Today's Date: 11/04/2021 ?Time: SLP Start Time (ACUTE ONLY): 2542 SLP Stop Time (ACUTE ONLY): 1512 ?SLP Time Calculation (min) (ACUTE ONLY): 21 min ? ?Past Medical History:  ?Past Medical History:  ?Diagnosis Date  ? Adenocarcinoma of lung, stage 3, right ([place]) 2020  ? Arthritis   ? Asthma   ? Cirrhosis ([place])   ? COPD (chronic obstructive pulmonary disease) ([place])   ? Dyslipidemia   ? Dyspnea   ? Esophageal varices (H[facility])   ? History of blood transfusion   ? History of hiatal hernia   ? Hypertension   ? Malignant neoplasm of bronchus of lower lobe, right ([place]) 01/10/2019  ? Pneumonia   ? Type 2 diabetes mellitus ([place])   ? Type II  ? ?Past Surgical History:  ?Past Surgical History:  ?Procedure Laterality Date  ? ABDOMINAL HYSTERECTOMY    ? ANTERIOR CRUCIATE LIGAMENT REPAIR Left   ? x 2  ? ESOPHAGOGASTRODUODENOSCOPY    ? ESOPHAGOGASTRODUODENOSCOPY Left 09/24/2021  ? Procedure: ESOPHAGOGASTRODUODENOSCOPY (EGD);  Surgeon: Sharyn Creamer, MD;  Location: [facility];  Service: Gastroenterology;  Laterality: Left;  ? HEMOSTASIS CONTROL  09/24/2021  ? Procedure: HEMOSTASIS CONTROL;  Surgeon: Sharyn Creamer, MD;  Location: [facility];  Service: Gastroenterology;;  ? LOBECTOMY Right 01/07/2019  ? Procedure: RIGHT LOWER LOBE LOBECTOMY AND INTERCOSTAL NERVE BLOCK;  Surgeon: Melrose Nakayama, MD;  Location: [facility];  Service: Thoracic;  Laterality: Right;  ? VIDEO ASSISTED THORACOSCOPY (VATS)/WEDGE RESECTION Right 01/07/2019  ? Procedure: VIDEO ASSISTED THORACOSCOPY (VATS) RIGHT LOWER LOBE WEDGE RESECTION;  Surgeon: Melrose Nakayama, MD;  Location: [facility];  Service: Thoracic;  Laterality: Right;  ? ?HPI:  ?Sandy [name] is a 66 year old female with multiple recent hospitalizations, past medical history significant for alcoholic liver cirrhosis with esophageal varices, history of GI bleeding, lung cancer  status post right lobectomy, hypertension, hyperlipidemia, type 2 diabetes mellitus, hypothyroidism recently discharged from the hospital with COPD exacerbation and A-fib RVR.  Patient apparently was staying with her sister for the past several days and they found her to be obtunded this morning with acute change in mental status and brought her to the emergency department for further evaluation.     She has had a recent admission in early February for upper GI bleeding with hematemesis.  She had EGD on 09/24/2021 showing LA grade D esophagitis with no bleeding.  She has portal hypertensive gastropathy few gastric polyps and a single nonbleeding angioectasia in the duodenum.  She was treated with APC and was placed on PPI for 8 weeks.  She was also treated with SBP prophylaxis for 1 week.  She has had recurrent paroxysmal atrial fibrillation with RVR managed with medications. Chest xray shows:Right base collapse/consolidative opacity with right pleural  effusion, similar to prior. BSE requested.  ?  ?Assessment / Plan / Recommendation  ?Clinical Impression ? Clinical swallow evaluation completed in room with Pt seated up in chair. Pt alert, but confused, states she is in [place], able to follow simple commands. Oral motor examination is WNL in setting of edentulous status. Pt assessed with ice chips, thin water via cup/straw, puree, soft solids, and Pt declined regular textures due to edentulous. Pt without overt signs of aspiration and no reports of globus, mildly prolonged oral phase with soft solids. Pt with altered mental status and D3/mech soft is appropriate for Pt at this time. She seems to  prefer to drink liquids over eating solid foods and consumed all of the water presented and her shake. Pt will need set up assist and intermittent supervsion given AMS. Recommend D3/mech soft and thin liquids and po medication whole in puree (can try whole with water also). Pt reports that she eats softer foods at home  due to lack of dentition. SLP will sign off. Reconsult if the need arises. ?SLP Visit Diagnosis: Dysphagia, unspecified (R13.10) ?   ?Aspiration Risk ? Mild aspiration risk  ?  ?Diet Recommendation Dysphagia 3 (Mech soft);Thin liquid  ? ?Liquid Administration via: Cup;Straw ?Medication Administration: Whole meds with puree ?Supervision: Patient able to self feed;Intermittent supervision to cue for compensatory strategies (due to AMS) ?Postural Changes: Seated upright at 90 degrees;Remain upright for at least 30 minutes after po intake  ?  ?Other  Recommendations Oral Care Recommendations: Oral care BID ?Other Recommendations: Clarify dietary restrictions   ? ?Recommendations for follow up therapy are one component of a multi-disciplinary discharge planning process, led by the attending physician.  Recommendations may be updated based on patient status, additional functional criteria and insurance authorization. ? ?Follow up Recommendations No SLP follow up  ? ? ?  ?Assistance Recommended at Discharge None  ?Functional Status Assessment Patient has not had a recent decline in their functional status  ?Frequency and Duration    ?  ?  ?   ? ?Prognosis Prognosis for Safe Diet Advancement: Good  ? ?  ? ?Swallow Study   ?General Date of Onset: 11/03/21 ?HPI: Sandy [name] is a 66 year old female with multiple recent hospitalizations, past medical history significant for alcoholic liver cirrhosis with esophageal varices, history of GI bleeding, lung cancer status post right lobectomy, hypertension, hyperlipidemia, type 2 diabetes mellitus, hypothyroidism recently discharged from the hospital with COPD exacerbation and A-fib RVR.  Patient apparently was staying with her sister for the past several days and they found her to be obtunded this morning with acute change in mental status and brought her to the emergency department for further evaluation.     She has had a recent admission in early February for upper GI  bleeding with hematemesis.  She had EGD on 09/24/2021 showing LA grade D esophagitis with no bleeding.  She has portal hypertensive gastropathy few gastric polyps and a single nonbleeding angioectasia in the duodenum.  She was treated with APC and was placed on PPI for 8 weeks.  She was also treated with SBP prophylaxis for 1 week.  She has had recurrent paroxysmal atrial fibrillation with RVR managed with medications. Chest xray shows:Right base collapse/consolidative opacity with right pleural  effusion, similar to prior. BSE requested. ?Type of Study: Bedside Swallow Evaluation ?Previous Swallow Assessment: N/A ?Diet Prior to this Study: Dysphagia 3 (soft);Thin liquids ?Temperature Spikes Noted: N/A ?Respiratory Status: Room air ?History of Recent Intubation: No ?Behavior/Cognition: Alert;Cooperative;Requires cueing ?Oral Cavity Assessment: Within Functional Limits ?Oral Care Completed by SLP: Recent completion by staff ?Oral Cavity - Dentition: Edentulous ?Vision: Functional for self-feeding ?Self-Feeding Abilities: Able to feed self ?Patient Positioning: Upright in chair ?Baseline Vocal Quality: Normal ?Volitional Cough: Weak ?Volitional Swallow: Able to elicit  ?  ?Oral/Motor/Sensory Function Overall Oral Motor/Sensory Function: Within functional limits   ?Ice Chips Ice chips: Within functional limits ?Presentation: Spoon   ?Thin Liquid Thin Liquid: Within functional limits ?Presentation: Cup;Self Fed;Straw  ?  ?Nectar Thick Nectar Thick Liquid: Not tested   ?Honey Thick Honey Thick Liquid: Not tested   ?Puree Puree: Within functional limits ?Presentation:  Spoon   ?Solid ? ? ?  Solid: Impaired ?Presentation: Spoon ?Oral Phase Impairments: Impaired mastication ?Oral Phase Functional Implications: Prolonged oral transit  ? ?  ?Thank you, ? ?Genene Churn, [place] ?[phone] ? ?Thijs Brunton ?11/04/2021,3:30 PM ? ? ? ? ?

## 2021-11-04 NOTE — Assessment & Plan Note (Addendum)
Dietician consultation requested.  SLP recommended.  Dysphagia 3 diet.  ?

## 2021-11-04 NOTE — TOC Initial Note (Signed)
Transition of Care (TOC) - Initial/Assessment Note  ? ? ?Patient Details  ?Name: Sandy [name] ?MRN: [id number] ?Date of Birth: 08-May-1956 ? ?Transition of Care (TOC) CM/SW Contact:    ?Boneta Lucks, RN ?Phone Number: ?11/04/2021, 4:33 PM ? ?Clinical Narrative:  Patient admitted with acute hepatic encephalopathy. TOC spoke with her sister. PT is recommending SNF. Sister agrees she needs SNF. States patient had been driving herself, she continues to drink, ammonia levels have been elevated. She had all her teeth removed at [employer] school. Scheduled to get dentures next month. She has not been eating, just drinking shakes due to no teeth. Calms she states nauseated. Patient is confused,  FL2 sent out, will review offer with sister and patient if she is not confused. TOC to follow.             ? ? ?Expected Discharge Plan: [place] ?Barriers to Discharge: Continued Medical Work up ? ? ?Patient Goals and CMS Choice ?Patient states their goals for this hospitalization and ongoing recovery are:: to go to SNF ?CMS Medicare.gov Compare Post Acute Care list provided to:: Patient Represenative (must comment) ?Choice offered to / list presented to : Sibling ? ?Expected Discharge Plan and Services ?Expected Discharge Plan: [place] ?  ?   ?Living arrangements for the past 2 months: Apartment ?                ?   ?Prior Living Arrangements/Services ?Living arrangements for the past 2 months: Apartment ?Lives with:: Self ?Patient language and need for interpreter reviewed:: Yes ?       ?Need for Family Participation in Patient Care: Yes (Comment) ?Care giver support system in place?: Yes (comment) ?  ?Criminal Activity/Legal Involvement Pertinent to Current Situation/Hospitalization: No - Comment as needed ? ?Activities of Daily Living ?Home Assistive Devices/Equipment: None ?ADL Screening (condition at time of admission) ?Patient's cognitive ability adequate to safely complete daily  activities?: No ?Is the patient deaf or have difficulty hearing?: No ?Does the patient have difficulty seeing, even when wearing glasses/contacts?: No ?Does the patient have difficulty concentrating, remembering, or making decisions?: Yes ?Patient able to express need for assistance with ADLs?: Yes ?Does the patient have difficulty dressing or bathing?: No ?Independently performs ADLs?: No ?Communication: Independent ?Dressing (OT): Needs assistance ?Is this a change from baseline?: Change from baseline, expected to last <3days ?Grooming: Needs assistance ?Is this a change from baseline?: Change from baseline, expected to last <3 days ?Feeding: Independent ?Bathing: Needs assistance ?Is this a change from baseline?: Change from baseline, expected to last <3 days ?Toileting: Needs assistance ?Is this a change from baseline?: Change from baseline, expected to last <3 days ?In/Out Bed: Needs assistance ?Is this a change from baseline?: Change from baseline, expected to last <3 days ?Walks in Home: Needs assistance ?Is this a change from baseline?: Change from baseline, expected to last <3 days ?Does the patient have difficulty walking or climbing stairs?: Yes ?Weakness of Legs: Both ?Weakness of Arms/Hands: None ? ?Permission Sought/Granted ?  ?   ? Permission granted to share info w Relationship: sister ?   ? ?Emotional Assessment ?   ?Alcohol / Substance Use: Alcohol Use ?Psych Involvement: No (comment) ? ?Admission diagnosis:  Metabolic encephalopathy [B63.84] ?Hyperbilirubinemia [E80.6] ?Weakness [R53.1] ?Acute hepatic encephalopathy [K76.82] ?Altered mental status, unspecified altered mental status type [R41.82] ?Patient Active Problem List  ? Diagnosis Date Noted  ? Acute hepatic encephalopathy 11/03/2021  ? Atrial fibrillation with RVR ([place]) 10/29/2021  ?  Hypothyroidism 10/29/2021  ? Hematemesis 09/23/2021  ? History of lung cancer 09/23/2021  ? Paroxysmal atrial fibrillation (HCC)   ? Iron deficiency anemia  secondary to blood loss (chronic) 06/18/2021  ? Chronic cough 06/15/2020  ? Upper airway cough syndrome 05/07/2020  ? 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 DOE (dyspnea on exertion) 11/06/2019  ? Essential hypertension 11/06/2019  ? Anemia 02/12/2019  ? Goals of care, counseling/discussion 01/17/2019  ? S/P lobectomy of lung 01/07/2019  ? Type 2 diabetes mellitus ([place]) 12/18/2018  ? Solitary pulmonary nodule on lung CT 12/03/2018  ? Alcoholic cirrhosis ([place]) 97/47/1855  ? History of esophageal varices 10/18/2017  ? De Quervain's tenosynovitis, right 04/17/2015  ? ?PCP:  [name], MD ?Pharmacy:   ?[id number] [address] ?[place] ?[place] [address] ?Phone: [phone] Fax: [phone] ? ? ?Readmission Risk Interventions ?Readmission Risk Prevention Plan 11/04/2021  ?Transportation Screening Complete  ?[place] or Home Care Consult Complete  ?Social Work Consult for [place] Planning/Counseling Complete  ?Palliative Care Screening Not Applicable  ?Medication Review Press photographer) Complete  ?Some recent data might be hidden  ? ? ? ?

## 2021-11-04 NOTE — Progress Notes (Signed)
Patient does not understand not to get up on her own. She will not cooperate completely for neuro assessment. Have found patient in her bed naked twice with incontinence of stool and linen in the floor. She cooperates to get her bed made and to get her dressed. ?

## 2021-11-05 DIAGNOSIS — E43 Unspecified severe protein-calorie malnutrition: Secondary | ICD-10-CM

## 2021-11-05 DIAGNOSIS — K7030 Alcoholic cirrhosis of liver without ascites: Secondary | ICD-10-CM | POA: Diagnosis not present

## 2021-11-05 DIAGNOSIS — I10 Essential (primary) hypertension: Secondary | ICD-10-CM | POA: Diagnosis not present

## 2021-11-05 DIAGNOSIS — R0609 Other forms of dyspnea: Secondary | ICD-10-CM | POA: Diagnosis not present

## 2021-11-05 DIAGNOSIS — K7682 Hepatic encephalopathy: Secondary | ICD-10-CM | POA: Diagnosis not present

## 2021-11-05 LAB — GLUCOSE, CAPILLARY
Glucose-Capillary: 145 mg/dL — ABNORMAL HIGH (ref 70–99)
Glucose-Capillary: 124 mg/dL — ABNORMAL HIGH (ref 70–99)
Glucose-Capillary: 187 mg/dL — ABNORMAL HIGH (ref 70–99)
Glucose-Capillary: 222 mg/dL — ABNORMAL HIGH (ref 70–99)
Glucose-Capillary: 320 mg/dL — ABNORMAL HIGH (ref 70–99)

## 2021-11-05 LAB — BASIC METABOLIC PANEL
Anion gap: 7 (ref 5–15)
BUN: 24 mg/dL — ABNORMAL HIGH (ref 8–23)
CO2: 23 mmol/L (ref 22–32)
Calcium: 7.8 mg/dL — ABNORMAL LOW (ref 8.9–10.3)
Chloride: 105 mmol/L (ref 98–111)
Creatinine, Ser: 0.71 mg/dL (ref 0.44–1.00)
GFR, Estimated: >60 mL/min (ref >60)
Glucose, Bld: 144 mg/dL — ABNORMAL HIGH (ref 70–99)
Potassium: 3.6 mmol/L (ref 3.5–5.1)
Sodium: 135 mmol/L (ref 135–145)

## 2021-11-05 LAB — MAGNESIUM: Magnesium: 2.4 mg/dL (ref 1.7–2.4)

## 2021-11-05 LAB — AMMONIA: Ammonia: 53 umol/L — ABNORMAL HIGH (ref 9–35)

## 2021-11-05 MED ORDER — SODIUM CHLORIDE 0.9 % IV SOLN: INTRAVENOUS | Status: DC

## 2021-11-05 NOTE — TOC Progression Note (Signed)
Transition of Care (TOC) - Progression Note  ? ? ?Patient Details  ?Name: Sandy [name] ?MRN: [id number] ?Date of Birth: 05/29/1956 ? ?Transition of Care (TOC) CM/SW Contact  ?Boneta Lucks, RN ?Phone Number: ?11/05/2021, 3:15 PM ? ?Clinical Narrative:  SNF's declined bed offers. TOC called [facility], they agreed to make an offer,no beds today. They will be ready tomorrow. TOC called patient she is confused. TOC called sister is is accepting bed offer.  ? ? ?Expected Discharge Plan: [place] ?Barriers to Discharge: Continued Medical Work up ? ?Expected Discharge Plan and Services ?Expected Discharge Plan: [place] ?    ?Living arrangements for the past 2 months: Apartment ?              ?   ?Readmission Risk Interventions ?Readmission Risk Prevention Plan 11/04/2021  ?Transportation Screening Complete  ?[place] or Home Care Consult Complete  ?Social Work Consult for [place] Planning/Counseling Complete  ?Palliative Care Screening Not Applicable  ?Medication Review Press photographer) Complete  ?Some recent data might be hidden  ? ? ?

## 2021-11-05 NOTE — Care Management Important Message (Signed)
Important Message ? ?Patient Details  ?Name: Sandy [name] ?MRN: [id number] ?Date of Birth: 1955-11-22 ? ? ?Medicare Important Message Given:  Yes ? ? ? ? ?Tommy Medal ?11/05/2021, 3:31 PM ?

## 2021-11-05 NOTE — Progress Notes (Signed)
?PROGRESS NOTE ? ? ?Sandy [name]  GYK:[id number] DOB: Oct 16, 1955 DOA: 11/03/2021 ?PCP: [name], MD  ? ?Chief Complaint  ?Patient presents with  ? Altered Mental Status  ? ?Level of care: Med-Surg ? ?Brief Admission History:  ?66 year old female with multiple recent hospitalizations, past medical history significant for alcoholic liver cirrhosis with esophageal varices, history of GI bleeding, lung cancer status post right lobectomy, hypertension, hyperlipidemia, type 2 diabetes mellitus, hypothyroidism recently discharged from the hospital with COPD exacerbation and A-fib RVR.  Patient apparently was staying with her sister for the past several days and they found her to be obtunded this morning with acute change in mental status and brought her to the emergency department for further evaluation. ? ?She has had a recent admission in early February for upper GI bleeding with hematemesis.  She had EGD on 09/24/2021 showing LA grade D esophagitis with no bleeding.  She has portal hypertensive gastropathy few gastric polyps and a single nonbleeding angioectasia in the duodenum.  She was treated with APC and was placed on PPI for 8 weeks.  She was also treated with SBP prophylaxis for 1 week.  She has had recurrent paroxysmal atrial fibrillation with RVR managed with medications. ? ?Her UDS tested positive for marijuana.  She reportedly is no longer using alcohol however sister reported that alcohol is in the home. ? ?11/04/2021: mentation slowly improving but not back to baseline.  Taking lactulose.  Having bowel movements. PT recommending SNF. TOC consulted for SNF placement. SLP eval and Nutrition eval pending.  ? ?11/05/2021:  Pt less confused, eating and drinking better.   SNF bed offer to take patient tomorrow for rehab.   ?  ?Assessment and Plan: ?* Acute hepatic encephalopathy ?Patient presents with obtunded mental status with elevated ammonia level ?Started lactulose with goal of 3 BMs per  day. ?Continue other supportive measures as ordered. ?Mentation is improving.  ? ?History of esophageal varices ?Patient strongly advised to stop all alcohol consumption.  She reportedly still drinking alcohol.  ? ?Alcoholic cirrhosis ([place]) ?Disease appears to be worsening now with rising ammonia levels and increasing confusion and encephalopathy.  We are starting her on lactulose with goal of 3 bowel movements per day.  We will have her follow-up with her hepatologist/gastroenterologist for consideration of starting rifaximin in the outpatient setting.  Family reporting that patient still drinking alcohol.  No withdrawal symptoms at this time. Monitor.  ? ?Solitary pulmonary nodule on lung CT ?Patient is followed by [name] her oncologist with follow-up planned April 2023. ? ?Hypokalemia ?Replacement given and repleted.   ? ?Protein-calorie malnutrition, severe ?Dietician consultation requested.  SLP recommended.  Dysphagia 3 diet for now.  ? ?Hypothyroidism ?Resume home medication. ? ?Paroxysmal atrial fibrillation ([place]) ?Resume home diltiazem for rate control and nadolol which seems to be controlling rate. She is not candidate for anticoagulation due to poor compliance, chronic thrombocytopenia, chronic liver disease and ongoing alcohol abuse.   ? ?History of lung cancer ?Patient is status post right lumpectomy. ?She had a recent CTA chest done at [facility] showing increased consolidation to the right apex with follow-up planned with her oncologist [name].  Consult to palliative care requested.  ? ?Essential hypertension ?We plan to resume all of her home blood pressure medications. ? ?DOE (dyspnea on exertion) ?Likely secondary to right lobectomy.  Currently reported stable. ? ?Anemia ?Hemoglobin stable from recent testing we will follow ? ?S/P lobectomy of lung ?Follow-up with [name] her oncologist  for ongoing surveillance ? ?Type 2 diabetes mellitus ([place]) ?Carb modified diet with SSI  coverage ordered ?Holding home metformin. ? ?DVT prophylaxis: scd  ?Code Status: full  ?Family Communication: son updated 3/15 telephone, anticipating DC to SNF on 3/18  ?Disposition: Status is: Inpatient ?Remains inpatient appropriate because: IV medications required, SNF placement  ?  ?Consultants:  ?Dietitian ?SLP ?Procedures:  ? ?Antimicrobials:  ?  ?Subjective: ?Pt is eating and drinking better today.  No specific complaints.    ?Objective: ?Vitals:  ? 11/04/21 1308 11/04/21 2053 11/05/21 0928 11/05/21 1431  ?BP: 109/62 (!) 136/57 138/68 (!) 112/57  ?Pulse: (!) 55 64  68  ?Resp: 16 17  16   ?Temp:  97.6 ?F (36.4 ?C)  97.6 ?F (36.4 ?C)  ?TempSrc:  Oral  Oral  ?SpO2: 100% 100%  98%  ?Weight:      ?Height:      ? ? ?Intake/Output Summary (Last 24 hours) at 11/05/2021 1445 ?Last data filed at 11/05/2021 1100 ?Gross per 24 hour  ?Intake 1503.92 ml  ?Output 3 ml  ?Net 1500.92 ml  ? ?Filed Weights  ? 11/03/21 1048 11/03/21 1614  ?Weight: 58.4 kg 55.2 kg  ? ?Examination: ? ?General exam: frail emaciated chronically ill appearing, pale, Appears calm and comfortable  ?Respiratory system: Clear to auscultation. Respiratory effort normal. ?Cardiovascular system: normal S1 & S2 heard. No JVD, murmurs, rubs, gallops or clicks. No pedal edema. ?Gastrointestinal system: Abdomen is nondistended, soft and nontender. No organomegaly or masses felt. Normal bowel sounds heard. ?Central nervous system: Alert and oriented to person, place,  No focal neurological deficits. ?Extremities: Symmetric 5 x 5 power. ?Skin: No rashes, lesions or ulcers. ?Psychiatry: Judgement and insight appear poor. Mood & affect flat.  ? ?Data Reviewed: I have personally reviewed following labs and imaging studies ? ?CBC: ?Recent Labs  ?Lab 10/29/21 ?1611 10/29/21 ?2224 10/30/21 ?0407 10/30/21 ?2342 11/01/21 ?8099 11/03/21 ?1205 11/03/21 ?1232 11/04/21 ?8338  ?WBC 4.0  --  3.3* 9.1 6.7 4.4  --  4.0  ?NEUTROABS 2.7  --   --   --   --  3.4  --   --   ?HGB 12.1    < > 11.7* 10.7* 10.5* 13.3 13.3 11.5*  ?HCT 37.7   < > 36.2 32.6* 31.8* 41.5 39.0 37.1  ?MCV 107.4*  --  106.8* 105.2* 105.3* 108.4*  --  105.7*  ?PLT 216  --  234 289 241 242  --  160  ? < > = values in this interval not displayed.  ? ? ?Basic Metabolic Panel: ?Recent Labs  ?Lab 10/30/21 ?2342 11/01/21 ?2505 11/03/21 ?1205 11/03/21 ?1232 11/04/21 ?3976 11/05/21 ?0800  ?NA 131* 128* 137 139 135 135  ?K 4.1 4.6 3.7 3.7 3.0* 3.6  ?CL 101 97* 100 100 101 105  ?CO2 21* 22 27  --  25 23  ?GLUCOSE 179* 198* 108* 108* 171* 144*  ?BUN 21 28* 23 28* 18 24*  ?CREATININE 0.98 1.06* 0.67 0.60 0.57 0.71  ?CALCIUM 8.7* 8.6* 8.9  --  7.8* 7.8*  ?MG  --   --   --   --  1.8 2.4  ? ? ?CBG: ?Recent Labs  ?Lab 11/04/21 ?1749 11/04/21 ?2052 11/05/21 ?0241 11/05/21 ?0801 11/05/21 ?1122  ?GLUCAP 319* 261* 145* 124* 187*  ? ? ?Recent Results (from the past 240 hour(s))  ?Resp Panel by RT-PCR (Flu A&B, Covid) Nasopharyngeal Swab     Status: None  ? Collection Time: 10/29/21 10:13 PM  ?  Specimen: Nasopharyngeal Swab; Nasopharyngeal(NP) swabs in vial transport medium  ?Result Value Ref Range Status  ? SARS Coronavirus 2 by RT PCR NEGATIVE NEGATIVE Final  ?  Comment: (NOTE) ?SARS-CoV-2 target nucleic acids are NOT DETECTED. ? ?The SARS-CoV-2 RNA is generally detectable in upper respiratory ?specimens during the acute phase of infection. The lowest ?concentration of SARS-CoV-2 viral copies this assay can detect is ?138 copies/mL. A negative result does not preclude SARS-Cov-2 ?infection and should not be used as the sole basis for treatment or ?other patient management decisions. A negative result may occur with  ?improper specimen collection/handling, submission of specimen other ?than nasopharyngeal swab, presence of viral mutation(s) within the ?areas targeted by this assay, and inadequate number of viral ?copies(<138 copies/mL). A negative result must be combined with ?clinical observations, patient history, and  epidemiological ?information. The expected result is Negative. ? ?Fact Sheet for Patients:  ?EntrepreneurPulse.com.au ? ?Fact Sheet for Healthcare Providers:  ?IncredibleEmployment.be ? ?This test is no

## 2021-11-06 DIAGNOSIS — I10 Essential (primary) hypertension: Secondary | ICD-10-CM | POA: Diagnosis not present

## 2021-11-06 DIAGNOSIS — Z85118 Personal history of other malignant neoplasm of bronchus and lung: Secondary | ICD-10-CM

## 2021-11-06 DIAGNOSIS — I480 Paroxysmal atrial fibrillation: Secondary | ICD-10-CM

## 2021-11-06 DIAGNOSIS — Z8719 Personal history of other diseases of the digestive system: Secondary | ICD-10-CM | POA: Diagnosis not present

## 2021-11-06 DIAGNOSIS — K7030 Alcoholic cirrhosis of liver without ascites: Secondary | ICD-10-CM | POA: Diagnosis not present

## 2021-11-06 DIAGNOSIS — K7682 Hepatic encephalopathy: Secondary | ICD-10-CM | POA: Diagnosis not present

## 2021-11-06 LAB — GLUCOSE, CAPILLARY
Glucose-Capillary: 300 mg/dL — ABNORMAL HIGH (ref 70–99)
Glucose-Capillary: 196 mg/dL — ABNORMAL HIGH (ref 70–99)
Glucose-Capillary: 136 mg/dL — ABNORMAL HIGH (ref 70–99)

## 2021-11-06 MED ORDER — FOLIC ACID 1 MG PO TABS: 1.0000 mg | ORAL_TABLET | Freq: Every day | ORAL | Status: DC

## 2021-11-06 MED ORDER — LACTULOSE 10 GM/15ML PO SOLN: 20.0000 g | Freq: Two times a day (BID) | ORAL | 0 refills | Status: DC

## 2021-11-06 MED ORDER — ENSURE ENLIVE PO LIQD: 237.0000 mL | Freq: Three times a day (TID) | ORAL | 12 refills | Status: DC

## 2021-11-06 MED ORDER — THIAMINE HCL 100 MG PO TABS: 100.0000 mg | ORAL_TABLET | Freq: Every day | ORAL | Status: DC

## 2021-11-06 MED ORDER — LACTULOSE 10 GM/15ML PO SOLN
20.0000 g | Freq: Two times a day (BID) | ORAL | Status: DC
  Administered 2021-11-06: 20 g via ORAL

## 2021-11-06 MED ORDER — PREDNISONE 10 MG PO TABS: ORAL_TABLET | ORAL | 0 refills | Status: DC

## 2021-11-06 NOTE — TOC Transition Note (Signed)
Transition of Care (TOC) - CM/SW Discharge Note ? ? ?Patient Details  ?Name: Sandy [name] ?MRN: [id number] ?Date of Birth: 1956/06/17 ? ?Transition of Care (TOC) CM/SW Contact:  ?Boneta Lucks, RN ?Phone Number: ?11/06/2021, 11:27 AM ? ? ?Clinical Narrative:   Patient medically ready to discharge to [facility]. TOC called and updated lisa, patient sister.  [name] provided a room A2-2, RN called report.  [facility] scheduled and will pick up at 1PM, RN updated and will take patient and sticker to front entrance.  ? ? ?Final next level of care: [name] ?Barriers to Discharge: Barriers Resolved ? ?Patient Goals and CMS Choice ?Patient states their goals for this hospitalization and ongoing recovery are:: to go to SNF ?CMS Medicare.gov Compare Post Acute Care list provided to:: Patient Represenative (must comment) ?Choice offered to / list presented to : Sibling ? ?Discharge Placement ?            ?  ?Patient to be transferred to facility by: [facility] ?Name of family member notified: [name] ?Patient and family notified of of transfer: 11/06/21 ? ?Discharge Plan and Services ?  ? [facility] ? ?          Readmission Risk Interventions ?Readmission Risk Prevention Plan 11/06/2021 11/04/2021  ?Transportation Screening Complete Complete  ?PCP or Specialist Appt within 3-5 Days Complete -  ?[place] or Home Care Consult Complete Complete  ?Social Work Consult for [place] Planning/Counseling Complete Complete  ?Palliative Care Screening Complete Not Applicable  ?Medication Review Press photographer) Complete Complete  ?Some recent data might be hidden  ? ? ? ? ? ?

## 2021-11-06 NOTE — Discharge Summary (Signed)
Physician Discharge Summary  ?Sandy [name] ESP:[id number] DOB: 07/19/1956 DOA: 11/03/2021 ? ?PCP: [name], MD ? ?Admit date: 11/03/2021 ?Discharge date: 11/06/2021 ? ?Admitted From: Home  ?Disposition: SNF  ? ?Recommendations for Outpatient Follow-up:  ?Follow up with PCP in 2 weeks ?Follow up with GI in 1 month ?Please check blood sugar 1-3 times per day ?Please titrate lactulose for goal of 3 BMs per day.  ?Please obtain BMP/CBC in 1-2 weeks ?Please check Q33, folic acid in 1 month ?Please consult palliative care for goals of care.  ? ?Discharge Condition: STABLE   ?CODE STATUS: FULL ?DIET:  Dysphagia 3 diet with nutritional supplement between meals ?Please assist with feeding patient.   ? ?Brief Hospitalization Summary: ?Please see all hospital notes, images, labs for full details of the hospitalization. ?66 year old female with multiple recent hospitalizations, past medical history significant for alcoholic liver cirrhosis with esophageal varices, history of GI bleeding, lung cancer status post right lobectomy, hypertension, hyperlipidemia, type 2 diabetes mellitus, hypothyroidism recently discharged from the hospital with COPD exacerbation and A-fib RVR.  Patient apparently was staying with her sister for the past several days and they found her to be obtunded this morning with acute change in mental status and brought her to the emergency department for further evaluation. ? ?She has had a recent admission in early February for upper GI bleeding with hematemesis.  She had EGD on 09/24/2021 showing LA grade D esophagitis with no bleeding.  She has portal hypertensive gastropathy few gastric polyps and a single nonbleeding angioectasia in the duodenum.  She was treated with APC and was placed on PPI for 8 weeks.  She was also treated with SBP prophylaxis for 1 week.  She has had recurrent paroxysmal atrial fibrillation with RVR managed with medications. ? ?Her UDS tested positive for marijuana.  She  reportedly is no longer using alcohol however sister reported that alcohol is in the home. ? ?11/04/2021: mentation slowly improving but not back to baseline.  Taking lactulose.  Having bowel movements. PT recommending SNF. TOC consulted for SNF placement. SLP eval and Nutrition eval pending.  ? ?11/05/2021:  Pt less confused, eating and drinking better.   SNF bed offer to take patient tomorrow for rehab.   ? ?11/06/2021:  Pt remains stable.  Pt discharging to SNF today.   ? ?HOSPITAL COURSE BY PROBLEM LIST  ? ?Assessment and Plan: ?* Acute hepatic encephalopathy ?Patient presented with obtunded mental status with elevated ammonia level ?Started lactulose with goal of 3 BMs per day.  Her mentation has improved.  Continue lactulose with goals of 3 BMs per day.  ? ?History of esophageal varices ?Patient strongly advised to stop all alcohol consumption.  She reportedly was still drinking alcohol prior to this admission.  ? ?Alcoholic cirrhosis ([place]) ?Disease appears to be worsening now with rising ammonia levels and increasing confusion and encephalopathy.  We are starting her on lactulose with goal of 3 bowel movements per day.  Ambulatory referral to GI for cirrhosis management.  ? ?Solitary pulmonary nodule on lung CT ?Patient is followed by [name] her oncologist with follow-up planned April 2023. ? ?Hypokalemia ?Replacement given and repleted.   ? ?Protein-calorie malnutrition, severe ?Dietician consultation requested.  SLP recommended.  Dysphagia 3 diet.  ? ?Hypothyroidism ?Resume home medication. ? ?Paroxysmal atrial fibrillation ([place]) ?Resume home diltiazem for rate control and nadolol which seems to be controlling rate. She is not candidate for anticoagulation due to poor compliance, chronic thrombocytopenia, chronic liver disease  and ongoing alcohol abuse.   ? ?History of lung cancer ?Patient is status post right lumpectomy. ?She had a recent [facility]A chest done at [facility] showing increased  consolidation to the right apex with follow-up planned with her oncologist [name].  Consult to palliative care recommended.  ? ?Essential hypertension ?We plan to resume all of her home blood pressure medications. ? ?DOE (dyspnea on exertion) ?Likely secondary to right lobectomy.  Currently reported stable. ? ?Anemia ?Hemoglobin stable from recent testing ? ?S/P lobectomy of lung ?Follow-up with [name] her oncologist for ongoing surveillance ? ?Type 2 diabetes mellitus ([place]) ?Carb modified diet  ?Resume home metformin. ? ?Discharge Diagnoses:  ?Principal Problem: ?  Acute hepatic encephalopathy ?Active Problems: ?  Solitary pulmonary nodule on lung CT ?  Alcoholic cirrhosis ([place]) ?  History of esophageal varices ?  Type 2 diabetes mellitus ([place]) ?  S/P lobectomy of lung ?  Anemia ?  DOE (dyspnea on exertion) ?  Essential hypertension ?  History of lung cancer ?  Paroxysmal atrial fibrillation ([facility]) ?  Hypothyroidism ?  Protein-calorie malnutrition, severe ?  Hypokalemia ? ? ?Discharge Instructions: ?Discharge Instructions   ? ? Ambulatory referral to Gastroenterology   Complete by: As directed ?  ? ?  ? ?Allergies as of 11/06/2021   ? ?   Reactions  ? Codeine Nausea And Vomiting  ? Nsaids   ? Avoid due to esophageal varices  ? Ceftriaxone Rash  ? ?  ? ?  ?Medication List  ?  ? ?TAKE these medications   ? ?atorvastatin 10 MG tablet ?Commonly known as: LIPITOR ?Take 10 mg by mouth at bedtime. ?  ?diltiazem 120 MG 24 hr capsule ?Commonly known as: Cardizem CD ?Take 1 capsule (120 mg total) by mouth daily. ?  ?feeding supplement Liqd ?Take 237 mLs by mouth 3 (three) times daily between meals. ?  ?folic acid 1 MG tablet ?Commonly known as: FOLVITE ?Take 1 tablet (1 mg total) by mouth daily. ?Start taking on: November 07, 2021 ?  ?Integra Plus Caps ?TAKE 1 CAPSULE BY MOUTH DAILY ?What changed: when to take this ?  ?lactulose 10 GM/15ML solution ?Commonly known as: [place] ?Take 30 mLs (20 g total) by mouth 2  (two) times daily. ?  ?levothyroxine 50 MCG tablet ?Commonly known as: SYNTHROID ?Take 50 mcg by mouth daily before breakfast. ?  ?metFORMIN 500 MG 24 hr tablet ?Commonly known as: GLUCOPHAGE-XR ?Take 500 mg by mouth at bedtime. ?  ?nadolol 20 MG tablet ?Commonly known as: CORGARD ?Take 20 mg by mouth every evening. ?  ?omeprazole 40 MG capsule ?Commonly known as: PRILOSEC ?Take 1 capsule (40 mg total) by mouth in the morning and at bedtime. ?  ?predniSONE 10 MG tablet ?Commonly known as: DELTASONE ?Take 1 tab po daily x 3 days then 0.5 tab po daily x 3 days then stop ?Start taking on: November 07, 2021 ?What changed: See the new instructions. ?  ?thiamine 100 MG tablet ?Take 1 tablet (100 mg total) by mouth daily. ?  ? ?  ? ? Contact information for after-discharge care   ? ? Destination   ? ? [facility] .   ?Service: Skilled Nursing ?Contact information: ?[place] ?[address] ?[phone] ? ?  ?  ? ?  ?  ? ?  ?  ? ?  ? ?Allergies  ?Allergen Reactions  ? Codeine Nausea And Vomiting  ? Nsaids   ?  Avoid due to esophageal varices  ? Ceftriaxone Rash  ? ?Allergies as of 11/06/2021   ? ?   Reactions  ? Codeine Nausea And Vomiting  ? Nsaids   ? Avoid due to esophageal varices  ? Ceftriaxone Rash  ? ?  ? ?  ?Medication List  ?  ? ?TAKE these medications   ? ?atorvastatin 10 MG tablet ?Commonly known as: LIPITOR ?Take 10 mg by mouth at bedtime. ?  ?diltiazem 120 MG 24 hr capsule ?Commonly known as: Cardizem CD ?Take 1 capsule (120 mg total) by mouth daily. ?  ?feeding supplement Liqd ?Take 237 mLs by mouth 3 (three) times daily between meals. ?  ?folic acid 1 MG tablet ?Commonly known as: FOLVITE ?Take 1 tablet (1 mg total) by mouth daily. ?Start taking on: November 07, 2021 ?  ?Integra Plus Caps ?TAKE 1 CAPSULE BY MOUTH DAILY ?What changed: when to take this ?  ?lactulose 10 GM/15ML solution ?Commonly known as: [place] ?Take 30 mLs (20 g total) by mouth 2 (two)  times daily. ?  ?levothyroxine 50 MCG tablet ?Commonly known as: SYNTHROID ?Take 50 mcg by mouth daily before breakfast. ?  ?metFORMIN 500 MG 24 hr tablet ?Commonly known as: GLUCOPHAGE-XR ?Take 500 mg by mouth at be

## 2021-11-06 NOTE — Progress Notes (Signed)
Report has been given to [place]. ?

## 2021-11-06 NOTE — Progress Notes (Signed)
Nsg Discharge Note ? ?Admit Date:  11/03/2021 ?Discharge date: 11/06/2021 ?  ?[name] to be D/C'd Skilled nursing facility per MD order.  AVS completed.  Copy for chart, and copy for patient signed, and dated. ?Patient/caregiver able to verbalize understanding. ? ?Discharge Medication: ?Allergies as of 11/06/2021   ? ?   Reactions  ? Codeine Nausea And Vomiting  ? Nsaids   ? Avoid due to esophageal varices  ? Ceftriaxone Rash  ? ?  ? ?  ?Medication List  ?  ? ?TAKE these medications   ? ?atorvastatin 10 MG tablet ?Commonly known as: LIPITOR ?Take 10 mg by mouth at bedtime. ?  ?diltiazem 120 MG 24 hr capsule ?Commonly known as: Cardizem CD ?Take 1 capsule (120 mg total) by mouth daily. ?  ?feeding supplement Liqd ?Take 237 mLs by mouth 3 (three) times daily between meals. ?  ?folic acid 1 MG tablet ?Commonly known as: FOLVITE ?Take 1 tablet (1 mg total) by mouth daily. ?Start taking on: November 07, 2021 ?  ?Integra Plus Caps ?TAKE 1 CAPSULE BY MOUTH DAILY ?What changed: when to take this ?  ?lactulose 10 GM/15ML solution ?Commonly known as: [place] ?Take 30 mLs (20 g total) by mouth 2 (two) times daily. ?  ?levothyroxine 50 MCG tablet ?Commonly known as: SYNTHROID ?Take 50 mcg by mouth daily before breakfast. ?  ?metFORMIN 500 MG 24 hr tablet ?Commonly known as: GLUCOPHAGE-XR ?Take 500 mg by mouth at bedtime. ?  ?nadolol 20 MG tablet ?Commonly known as: CORGARD ?Take 20 mg by mouth every evening. ?  ?omeprazole 40 MG capsule ?Commonly known as: PRILOSEC ?Take 1 capsule (40 mg total) by mouth in the morning and at bedtime. ?  ?predniSONE 10 MG tablet ?Commonly known as: DELTASONE ?Take 1 tab po daily x 3 days then 0.5 tab po daily x 3 days then stop ?Start taking on: November 07, 2021 ?What changed: See the new instructions. ?  ?thiamine 100 MG tablet ?Take 1 tablet (100 mg total) by mouth daily. ?  ? ?  ? ? ?Discharge Assessment: ?Vitals:  ? 11/06/21 0434 11/06/21 2694  ?BP: 121/70 (!) 106/59  ?Pulse: 90   ?Resp:     ?Temp:    ?SpO2: 99%   ? Skin clean, dry and intact without evidence of skin break down, no evidence of skin tears noted. ?IV catheter discontinued intact. Site without signs and symptoms of complications - no redness or edema noted at insertion site, patient denies c/o pain - only slight tenderness at site.  Dressing with slight pressure applied. ? ?D/c Instructions-Education: ?Discharge instructions given to patient/family with verbalized understanding. ?D/c education completed with patient/family including follow up instructions, medication list, d/c activities limitations if indicated, with other d/c instructions as indicated by MD - patient able to verbalize understanding, all questions fully answered. ?Patient instructed to return to ED, call 911, or call MD for any changes in condition.  ?Patient escorted via [place], and D/C home via private auto. ? ?Marylynn Pearson, LPN ?8/54/6270 35:00 AM  ?

## 2021-11-06 NOTE — Discharge Instructions (Signed)
IMPORTANT INFORMATION: PAY CLOSE ATTENTION  ? ?PHYSICIAN DISCHARGE INSTRUCTIONS ? ?Follow with Primary care provider  [name], MD  and other consultants as instructed by your Hospitalist Physician ? ?SEEK MEDICAL CARE OR RETURN TO EMERGENCY ROOM IF SYMPTOMS COME BACK, WORSEN OR NEW PROBLEM DEVELOPS  ? ?Please note: ?You were cared for by a hospitalist during your hospital stay. Every effort will be made to forward records to your primary care provider.  You can request that your primary care provider send for your hospital records if they have not received them.  Once you are discharged, your primary care physician will handle any further medical issues. Please note that NO REFILLS for any discharge medications will be authorized once you are discharged, as it is imperative that you return to your primary care physician (or establish a relationship with a primary care physician if you do not have one) for your post hospital discharge needs so that they can reassess your need for medications and monitor your lab values. ? ?Please get a complete blood count and chemistry panel checked by your Primary MD at your next visit, and again as instructed by your Primary MD. ? ?Get Medicines reviewed and adjusted: ?Please take all your medications with you for your next visit with your Primary MD ? ?Laboratory/radiological data: ?Please request your Primary MD to go over all hospital tests and procedure/radiological results at the follow up, please ask your primary care provider to get all Hospital records sent to his/her office. ? ?In some cases, they will be blood work, cultures and biopsy results pending at the time of your discharge. Please request that your primary care provider follow up on these results. ? ?If you are diabetic, please bring your blood sugar readings with you to your follow up appointment with primary care.   ? ?Please call and make your follow up appointments as soon as possible.   ? ?Also  Note the following: ?If you experience worsening of your admission symptoms, develop shortness of breath, life threatening emergency, suicidal or homicidal thoughts you must seek medical attention immediately by calling 911 or calling your MD immediately  if symptoms less severe. ? ?You must read complete instructions/literature along with all the possible adverse reactions/side effects for all the Medicines you take and that have been prescribed to you. Take any new Medicines after you have completely understood and accpet all the possible adverse reactions/side effects.  ? ?Do not drive when taking Pain medications or sleeping medications (Benzodiazepines) ? ?Do not take more than prescribed Pain, Sleep and Anxiety Medications. It is not advisable to combine anxiety,sleep and pain medications without talking with your primary care practitioner ? ?Special Instructions: If you have smoked or chewed Tobacco  in the last 2 yrs please stop smoking, stop any regular Alcohol  and or any Recreational drug use. ? ?Wear Seat belts while driving.  Do not drive if taking any narcotic, mind altering or controlled substances or recreational drugs or alcohol.  ? ? ? ? ? ?

## 2021-11-08 ENCOUNTER — Emergency Department (HOSPITAL_COMMUNITY): Payer: Medicare Other

## 2021-11-08 ENCOUNTER — Other Ambulatory Visit: Payer: Self-pay

## 2021-11-08 ENCOUNTER — Encounter (HOSPITAL_COMMUNITY): Payer: Self-pay | Admitting: Emergency Medicine

## 2021-11-08 ENCOUNTER — Inpatient Hospital Stay (HOSPITAL_COMMUNITY)
Admission: EM | Admit: 2021-11-08 | Discharge: 2021-11-09 | DRG: 441 | Disposition: A | Discharge status: Hospice | Payer: Medicare Other | Source: Skilled Nursing Facility | Attending: Family Medicine | Admitting: Family Medicine

## 2021-11-08 DIAGNOSIS — Z6821 Body mass index (BMI) 21.0-21.9, adult: Secondary | ICD-10-CM

## 2021-11-08 DIAGNOSIS — B9689 Other specified bacterial agents as the cause of diseases classified elsewhere: Secondary | ICD-10-CM | POA: Diagnosis present

## 2021-11-08 DIAGNOSIS — I8510 Secondary esophageal varices without bleeding: Secondary | ICD-10-CM | POA: Diagnosis present

## 2021-11-08 DIAGNOSIS — E872 Acidosis, unspecified: Secondary | ICD-10-CM | POA: Diagnosis present

## 2021-11-08 DIAGNOSIS — K766 Portal hypertension: Secondary | ICD-10-CM | POA: Diagnosis present

## 2021-11-08 DIAGNOSIS — Z85118 Personal history of other malignant neoplasm of bronchus and lung: Secondary | ICD-10-CM

## 2021-11-08 DIAGNOSIS — K317 Polyp of stomach and duodenum: Secondary | ICD-10-CM | POA: Diagnosis present

## 2021-11-08 DIAGNOSIS — Z87891 Personal history of nicotine dependence: Secondary | ICD-10-CM

## 2021-11-08 DIAGNOSIS — Z8719 Personal history of other diseases of the digestive system: Secondary | ICD-10-CM | POA: Diagnosis not present

## 2021-11-08 DIAGNOSIS — J449 Chronic obstructive pulmonary disease, unspecified: Secondary | ICD-10-CM | POA: Diagnosis present

## 2021-11-08 DIAGNOSIS — F1010 Alcohol abuse, uncomplicated: Secondary | ICD-10-CM | POA: Diagnosis present

## 2021-11-08 DIAGNOSIS — E43 Unspecified severe protein-calorie malnutrition: Secondary | ICD-10-CM | POA: Diagnosis present

## 2021-11-08 DIAGNOSIS — Z79890 Hormone replacement therapy (postmenopausal): Secondary | ICD-10-CM

## 2021-11-08 DIAGNOSIS — K7030 Alcoholic cirrhosis of liver without ascites: Secondary | ICD-10-CM | POA: Diagnosis not present

## 2021-11-08 DIAGNOSIS — K31819 Angiodysplasia of stomach and duodenum without bleeding: Secondary | ICD-10-CM | POA: Diagnosis present

## 2021-11-08 DIAGNOSIS — Z7189 Other specified counseling: Secondary | ICD-10-CM | POA: Diagnosis not present

## 2021-11-08 DIAGNOSIS — K7031 Alcoholic cirrhosis of liver with ascites: Secondary | ICD-10-CM | POA: Diagnosis present

## 2021-11-08 DIAGNOSIS — M1990 Unspecified osteoarthritis, unspecified site: Secondary | ICD-10-CM | POA: Diagnosis present

## 2021-11-08 DIAGNOSIS — Z20822 Contact with and (suspected) exposure to covid-19: Secondary | ICD-10-CM | POA: Diagnosis present

## 2021-11-08 DIAGNOSIS — E039 Hypothyroidism, unspecified: Secondary | ICD-10-CM | POA: Diagnosis present

## 2021-11-08 DIAGNOSIS — Z820 Family history of epilepsy and other diseases of the nervous system: Secondary | ICD-10-CM

## 2021-11-08 DIAGNOSIS — Z8249 Family history of ischemic heart disease and other diseases of the circulatory system: Secondary | ICD-10-CM

## 2021-11-08 DIAGNOSIS — R7881 Bacteremia: Secondary | ICD-10-CM | POA: Diagnosis present

## 2021-11-08 DIAGNOSIS — D696 Thrombocytopenia, unspecified: Secondary | ICD-10-CM | POA: Diagnosis present

## 2021-11-08 DIAGNOSIS — Z79899 Other long term (current) drug therapy: Secondary | ICD-10-CM

## 2021-11-08 DIAGNOSIS — Z806 Family history of leukemia: Secondary | ICD-10-CM

## 2021-11-08 DIAGNOSIS — K2100 Gastro-esophageal reflux disease with esophagitis, without bleeding: Secondary | ICD-10-CM | POA: Diagnosis present

## 2021-11-08 DIAGNOSIS — E785 Hyperlipidemia, unspecified: Secondary | ICD-10-CM | POA: Diagnosis present

## 2021-11-08 DIAGNOSIS — R911 Solitary pulmonary nodule: Secondary | ICD-10-CM | POA: Diagnosis present

## 2021-11-08 DIAGNOSIS — E860 Dehydration: Secondary | ICD-10-CM | POA: Diagnosis present

## 2021-11-08 DIAGNOSIS — K7682 Hepatic encephalopathy: Principal | ICD-10-CM | POA: Diagnosis present

## 2021-11-08 DIAGNOSIS — Z902 Acquired absence of lung [part of]: Secondary | ICD-10-CM

## 2021-11-08 DIAGNOSIS — Z515 Encounter for palliative care: Secondary | ICD-10-CM | POA: Diagnosis not present

## 2021-11-08 DIAGNOSIS — B9561 Methicillin susceptible Staphylococcus aureus infection as the cause of diseases classified elsewhere: Secondary | ICD-10-CM | POA: Diagnosis not present

## 2021-11-08 DIAGNOSIS — R0609 Other forms of dyspnea: Secondary | ICD-10-CM | POA: Diagnosis not present

## 2021-11-08 DIAGNOSIS — E119 Type 2 diabetes mellitus without complications: Secondary | ICD-10-CM | POA: Diagnosis present

## 2021-11-08 DIAGNOSIS — I480 Paroxysmal atrial fibrillation: Secondary | ICD-10-CM | POA: Diagnosis present

## 2021-11-08 DIAGNOSIS — Z66 Do not resuscitate: Secondary | ICD-10-CM | POA: Diagnosis present

## 2021-11-08 DIAGNOSIS — K3189 Other diseases of stomach and duodenum: Secondary | ICD-10-CM | POA: Diagnosis present

## 2021-11-08 DIAGNOSIS — N390 Urinary tract infection, site not specified: Secondary | ICD-10-CM | POA: Diagnosis not present

## 2021-11-08 DIAGNOSIS — Z7984 Long term (current) use of oral hypoglycemic drugs: Secondary | ICD-10-CM

## 2021-11-08 DIAGNOSIS — I10 Essential (primary) hypertension: Secondary | ICD-10-CM | POA: Diagnosis present

## 2021-11-08 LAB — CBC WITH DIFFERENTIAL/PLATELET
Abs Immature Granulocytes: 0.14 10*3/uL — ABNORMAL HIGH (ref 0.00–0.07)
Basophils Absolute: 0.1 10*3/uL (ref 0.0–0.1)
Basophils Relative: 1 %
Eosinophils Absolute: 0.1 10*3/uL (ref 0.0–0.5)
Eosinophils Relative: 1 %
HCT: 38.2 % (ref 36.0–46.0)
Hemoglobin: 12.8 g/dL (ref 12.0–15.0)
Immature Granulocytes: 1 %
Lymphocytes Relative: 5 %
Lymphs Abs: 0.5 10*3/uL — ABNORMAL LOW (ref 0.7–4.0)
MCH: 34.8 pg — ABNORMAL HIGH (ref 26.0–34.0)
MCHC: 33.5 g/dL (ref 30.0–36.0)
MCV: 103.8 fL — ABNORMAL HIGH (ref 80.0–100.0)
Monocytes Absolute: 1 10*3/uL (ref 0.1–1.0)
Monocytes Relative: 9 %
Neutro Abs: 8.7 10*3/uL — ABNORMAL HIGH (ref 1.7–7.7)
Neutrophils Relative %: 83 %
Platelets: 178 10*3/uL (ref 150–400)
RBC: 3.68 MIL/uL — ABNORMAL LOW (ref 3.87–5.11)
RDW: 14.9 % (ref 11.5–15.5)
WBC: 10.4 10*3/uL (ref 4.0–10.5)
nRBC: 0 % (ref 0.0–0.2)

## 2021-11-08 LAB — URINALYSIS, ROUTINE W REFLEX MICROSCOPIC
Bilirubin Urine: NEGATIVE
Glucose, UA: NEGATIVE mg/dL
Hgb urine dipstick: NEGATIVE
Ketones, ur: NEGATIVE mg/dL
Nitrite: NEGATIVE
Protein, ur: NEGATIVE mg/dL
Specific Gravity, Urine: 1.018 (ref 1.005–1.030)
pH: 6 (ref 5.0–8.0)

## 2021-11-08 LAB — BLOOD GAS, ARTERIAL
Acid-base deficit: 0.4 mmol/L (ref 0.0–2.0)
Bicarbonate: 22.1 mmol/L (ref 20.0–28.0)
Drawn by: 27733
FIO2: 21 %
O2 Saturation: 98.9 %
Patient temperature: 36.6
pCO2 arterial: 28 mmHg — ABNORMAL LOW (ref 32–48)
pH, Arterial: 7.5 — ABNORMAL HIGH (ref 7.35–7.45)
pO2, Arterial: 106 mmHg (ref 83–108)

## 2021-11-08 LAB — COMPREHENSIVE METABOLIC PANEL
ALT: 49 U/L — ABNORMAL HIGH (ref 0–44)
AST: 44 U/L — ABNORMAL HIGH (ref 15–41)
Albumin: 2.2 g/dL — ABNORMAL LOW (ref 3.5–5.0)
Alkaline Phosphatase: 122 U/L (ref 38–126)
Anion gap: 10 (ref 5–15)
BUN: 34 mg/dL — ABNORMAL HIGH (ref 8–23)
CO2: 21 mmol/L — ABNORMAL LOW (ref 22–32)
Calcium: 8 mg/dL — ABNORMAL LOW (ref 8.9–10.3)
Chloride: 103 mmol/L (ref 98–111)
Creatinine, Ser: 0.69 mg/dL (ref 0.44–1.00)
GFR, Estimated: >60 mL/min (ref >60)
Glucose, Bld: 143 mg/dL — ABNORMAL HIGH (ref 70–99)
Potassium: 4 mmol/L (ref 3.5–5.1)
Sodium: 134 mmol/L — ABNORMAL LOW (ref 135–145)
Total Bilirubin: 4.4 mg/dL — ABNORMAL HIGH (ref 0.3–1.2)
Total Protein: 5.9 g/dL — ABNORMAL LOW (ref 6.5–8.1)

## 2021-11-08 LAB — LACTIC ACID, PLASMA
Lactic Acid, Venous: 3.5 mmol/L (ref 0.5–1.9)
Lactic Acid, Venous: 3.4 mmol/L (ref 0.5–1.9)

## 2021-11-08 LAB — AMMONIA: Ammonia: 214 umol/L — ABNORMAL HIGH (ref 9–35)

## 2021-11-08 LAB — GLUCOSE, CAPILLARY
Glucose-Capillary: 143 mg/dL — ABNORMAL HIGH (ref 70–99)
Glucose-Capillary: 143 mg/dL — ABNORMAL HIGH (ref 70–99)

## 2021-11-08 LAB — PROTIME-INR
INR: 1.6 — ABNORMAL HIGH (ref 0.8–1.2)
Prothrombin Time: 19 seconds — ABNORMAL HIGH (ref 11.4–15.2)

## 2021-11-08 LAB — ETHANOL: Alcohol, Ethyl (B): <10 mg/dL (ref <10)

## 2021-11-08 LAB — RAPID URINE DRUG SCREEN, HOSP PERFORMED
Amphetamines: NOT DETECTED
Barbiturates: NOT DETECTED
Benzodiazepines: NOT DETECTED
Cocaine: NOT DETECTED
Opiates: NOT DETECTED
Tetrahydrocannabinol: POSITIVE — AB

## 2021-11-08 LAB — MAGNESIUM: Magnesium: 2 mg/dL (ref 1.7–2.4)

## 2021-11-08 LAB — LIPASE, BLOOD: Lipase: 47 U/L (ref 11–51)

## 2021-11-08 MED ORDER — THIAMINE HCL 100 MG/ML IJ SOLN
100.0000 mg | Freq: Every day | INTRAMUSCULAR | Status: DC
  Administered 2021-11-08 – 2021-11-09 (×2): 100 mg via INTRAVENOUS
  Filled 2021-11-08 (×2): qty 2

## 2021-11-08 MED ORDER — PANTOPRAZOLE SODIUM 40 MG IV SOLR
40.0000 mg | INTRAVENOUS | Status: DC
Start: 2021-11-08 — End: 2021-11-09
  Administered 2021-11-08: 40 mg via INTRAVENOUS
  Filled 2021-11-08: qty 10

## 2021-11-08 MED ORDER — LACTATED RINGERS IV SOLN: INTRAVENOUS | Status: DC

## 2021-11-08 MED ORDER — FOLIC ACID 5 MG/ML IJ SOLN
1.0000 mg | Freq: Every day | INTRAMUSCULAR | Status: DC
  Administered 2021-11-08 – 2021-11-09 (×2): 1 mg via INTRAVENOUS
  Filled 2021-11-08 (×2): qty 0.2

## 2021-11-08 MED ORDER — HEPARIN SODIUM (PORCINE) 5000 UNIT/ML IJ SOLN
5000.0000 [IU] | Freq: Three times a day (TID) | INTRAMUSCULAR | Status: DC
  Administered 2021-11-08 – 2021-11-09 (×2): 5000 [IU] via SUBCUTANEOUS
  Filled 2021-11-08 (×2): qty 1

## 2021-11-08 MED ORDER — LACTATED RINGERS IV BOLUS
1000.0000 mL | Freq: Once | INTRAVENOUS | Status: AC
  Administered 2021-11-08: 1000 mL via INTRAVENOUS

## 2021-11-08 MED ORDER — METOPROLOL TARTRATE 5 MG/5ML IV SOLN: 5.0000 mg | Freq: Four times a day (QID) | INTRAVENOUS | Status: DC | PRN

## 2021-11-08 MED ORDER — ONDANSETRON HCL 4 MG PO TABS: 4.0000 mg | ORAL_TABLET | Freq: Four times a day (QID) | ORAL | Status: DC | PRN

## 2021-11-08 MED ORDER — LACTULOSE ENEMA
300.0000 mL | Freq: Three times a day (TID) | ORAL | Status: DC
  Administered 2021-11-08 – 2021-11-09 (×3): 300 mL via RECTAL
  Filled 2021-11-08 (×6): qty 300

## 2021-11-08 MED ORDER — INSULIN ASPART 100 UNIT/ML IJ SOLN
0.0000 [IU] | Freq: Three times a day (TID) | INTRAMUSCULAR | Status: DC
  Administered 2021-11-08 – 2021-11-09 (×3): 1 [IU] via SUBCUTANEOUS

## 2021-11-08 MED ORDER — SODIUM CHLORIDE 0.9 % IV BOLUS
1000.0000 mL | Freq: Once | INTRAVENOUS | Status: AC
  Administered 2021-11-08: 1000 mL via INTRAVENOUS

## 2021-11-08 MED ORDER — ONDANSETRON HCL 4 MG/2ML IJ SOLN: 4.0000 mg | Freq: Four times a day (QID) | INTRAMUSCULAR | Status: DC | PRN

## 2021-11-08 NOTE — Assessment & Plan Note (Signed)
Patient is followed by [name] her oncologist with follow-up planned April 2023. ?

## 2021-11-08 NOTE — TOC Progression Note (Signed)
Transition of Care (TOC) - Progression Note  ? ? ?Patient Details  ?Name: [name] ?MRN: [id number] ?Date of Birth: 1956/03/18 ? ?Transition of Care (TOC) CM/SW Contact  ?Boneta Lucks, RN ?Phone Number: ?11/08/2021, 2:44 PM ? ?Clinical Narrative:   Patient readmitted from [facility], discharged 2 days ago, patient not getting lactulose ,her ammonia is over 200. MD is consulting Palliative. TOC to follow.  ? ? ?Expected Discharge Plan: [place] ?  ? ?Expected Discharge Plan and Services ?Expected Discharge Plan: [place] ?  ?  ?  ?  ?                ?  ?  ?  ?  ?  ?  ?

## 2021-11-08 NOTE — Assessment & Plan Note (Addendum)
Full comfort measures started.  ?

## 2021-11-08 NOTE — H&P (Addendum)
?History and Physical  ?[place] ? ?Sandy [name]:[id number] DOB: 09-05-1955 DOA: 11/08/2021 ? ?PCP: Sandy Downing, [name]  ?Patient coming from: SNF ?Level of care: Med-Surg ? ?I have personally briefly reviewed patient's old medical records in [place] ? ?Chief Complaint: AMS ? ?HPI: Sandy [name] is a 66 year old female with longstanding history of alcohol abuse, alcoholic liver cirrhosis with esophageal varices, GI bleeding, lung cancer s/p lobectomy, hypertension, hyperlipidemia, type 2 diabetes mellitus, hypothyroidism, COPD, atrial fibrillation.  Patient has had 2 recent hospitalizations in the last month.  She has recently discharged from [facility] for COPD exacerbation and A-fib with RVR.  She was admitted recently at [facility] for acute hepatic encephalopathy and discharged on lactulose.  She discharged to a skilled nursing facility.  Apparently not receiving lactulose regularly and returned to the emergency department with hepatic encephalopathy with an elevated ammonia level of 214.  She was last noted to be normal yesterday according to the nursing home staff.  They went in to check on her this morning and found her to be obtunded and sent her to the emergency department.  When she was in the hospital she was receiving lactulose twice daily with 3-4 bowel movements per day.  Her ammonia levels had improved and her mentation had normalized to baseline.  ? ?Patient recently had an admission in February for upper GI bleeding with hematemesis.  She had an EGD on 09/24/2021 showing LA grade D esophagitis with no bleeding.  She has portal hypertensive gastropathy, few gastric polyps and a single nonbleeding angioectasia in the duodenum.  She was treated with APC and was placed on PPI for 8 weeks. ? ?Patient has history of recurrent paroxysmal atrial fibrillation with RVR managed medically with diltiazem and nadolol. ? ?Her ED work-up significant for CT scan brain with no  acute findings.  ABG revealed a normal PCO2.  Ammonia level elevated at 214.  Her total bilirubin was 4.4.  Her lactic acid was 3.5.  Her platelets were 178.  She recently tested positive for THC.  Her CXR reveals a moderate right pleural effusion mostly unchanged from 3/15.   ? ?Review of Systems: Review of Systems  ?Unable to perform ROS: Patient unresponsive  ?  ?Past Medical History:  ?Diagnosis Date  ? Adenocarcinoma of lung, stage 3, right ([place]) 2020  ? Arthritis   ? Asthma   ? Cirrhosis ([place])   ? COPD (chronic obstructive pulmonary disease) ([place])   ? Dyslipidemia   ? Dyspnea   ? Esophageal varices ([facility])   ? History of blood transfusion   ? History of hiatal hernia   ? Hypertension   ? Malignant neoplasm of bronchus of lower lobe, right ([place]) 01/10/2019  ? Pneumonia   ? Type 2 diabetes mellitus ([place])   ? Type II  ? ? ?Past Surgical History:  ?Procedure Laterality Date  ? ABDOMINAL HYSTERECTOMY    ? ANTERIOR CRUCIATE LIGAMENT REPAIR Left   ? x 2  ? ESOPHAGOGASTRODUODENOSCOPY    ? ESOPHAGOGASTRODUODENOSCOPY Left 09/24/2021  ? Procedure: ESOPHAGOGASTRODUODENOSCOPY (EGD);  Surgeon: Sandy Creamer, [name];  Location: [facility];  Service: Gastroenterology;  Laterality: Left;  ? HEMOSTASIS CONTROL  09/24/2021  ? Procedure: HEMOSTASIS CONTROL;  Surgeon: Sandy Creamer, [name];  Location: [place];  Service: Gastroenterology;;  ? LOBECTOMY Right 01/07/2019  ? Procedure: RIGHT LOWER LOBE LOBECTOMY AND INTERCOSTAL NERVE BLOCK;  Surgeon: Sandy Nakayama, [name];  Location: [place];  Service:  Thoracic;  Laterality: Right;  ? VIDEO ASSISTED THORACOSCOPY (VATS)/WEDGE RESECTION Right 01/07/2019  ? Procedure: VIDEO ASSISTED THORACOSCOPY (VATS) RIGHT LOWER LOBE WEDGE RESECTION;  Surgeon: Sandy Nakayama, [name];  Location: [place];  Service: Thoracic;  Laterality: Right;  ? ? ? reports that she quit smoking about 7 years ago. Her smoking use included cigarettes. She has a 37.00 pack-year smoking history. She has never used smokeless  tobacco. She reports that she does not currently use alcohol after a past usage of about 1.0 standard drink per week. She reports that she does not currently use drugs after having used the following drugs: Marijuana. ? ?Allergies  ?Allergen Reactions  ? Codeine Nausea And Vomiting  ? Nsaids   ?  Avoid due to esophageal varices  ? Ceftriaxone Rash  ? ? ?Family History  ?Problem Relation Age of Onset  ? Leukemia Mother   ? Parkinson's disease Father   ? Hypertension Sister   ? Lung disease Neg Hx   ? ? ?Prior to Admission medications   ?Medication Sig Start Date End Date Taking? Authorizing Provider  ?atorvastatin (LIPITOR) 10 MG tablet Take 10 mg by mouth at bedtime.    Yes Sandy [name]  ?diltiazem (CARDIZEM CD) 120 MG 24 hr capsule Take 1 capsule (120 mg total) by mouth daily. 11/01/21 11/01/22 Yes Sandy [name]  ?feeding supplement (ENSURE ENLIVE / ENSURE PLUS) LIQD Take 237 mLs by mouth 3 (three) times daily between meals. 11/06/21  Yes Sandy Self [name], [name]  ?FeFum-FePoly-FA-B Cmp-C-Biot (INTEGRA PLUS) CAPS TAKE 1 CAPSULE BY MOUTH DAILY ?Patient taking differently: Take 1 capsule by mouth every evening. 06/28/21  Yes Sandy Bears, [name]  ?folic acid (FOLVITE) 1 MG tablet Take 1 tablet (1 mg total) by mouth daily. 11/07/21  Yes Sandy [name]  ?lactulose (CHRONULAC) 10 GM/15ML solution Take 30 mLs (20 g total) by mouth 2 (two) times daily. 11/06/21  Yes Sandy Oliver [name], [name]  ?levothyroxine (SYNTHROID) 50 MCG tablet Take 50 mcg by mouth daily before breakfast.   Yes Sandy [name]  ?metFORMIN (GLUCOPHAGE-XR) 500 MG 24 hr tablet Take 500 mg by mouth at bedtime.   Yes Sandy [name]  ?nadolol (CORGARD) 20 MG tablet Take 20 mg by mouth every evening. 06/15/20  Yes Sandy [name]  ?omeprazole (PRILOSEC) 40 MG capsule Take 1 capsule (40 mg total) by mouth in the morning and at bedtime. 09/26/21  Yes Sandy Lose, [name]  ?predniSONE (DELTASONE) 10 MG tablet  Take 1 tab po daily x 3 days then 0.5 tab po daily x 3 days then stop ?Patient taking differently: Take 10 mg by mouth daily. Take 1 tab po daily x 3 days then 0.5 tab po daily x 3 days then stop 11/07/21  Yes Eva Griffo [name], [name]  ?thiamine 100 MG tablet Take 1 tablet (100 mg total) by mouth daily. 11/06/21  Yes Murlean Iba, [name]  ? ? ?Physical Exam: ?Vitals:  ? 11/08/21 1128 11/08/21 1129 11/08/21 1130 11/08/21 1300  ?BP: (!) 123/55  (!) 105/49 98/68  ?Pulse: 70  68 66  ?Resp: 19  18 20   ?Temp: 98 ?F (36.7 ?C)     ?TempSrc: Axillary     ?SpO2: 98%  99% 100%  ?Weight:  55 kg    ?Height:  5\' 3"  (1.6 m)    ? ? ?Constitutional: frail, emaciated, obtunded female lying in bed ?Eyes: PERRL, lids and conjunctivae normal ?ENMT: Mucous membranes are dry. Posterior pharynx  clear of any exudate or lesions. Poor dentition.  ?Neck: normal, supple, no masses, no thyromegaly ?Respiratory: diminished BS RLL. clear to auscultation bilaterally, no wheezing, no crackles. Normal respiratory effort. No accessory muscle use.  ?Cardiovascular: normal s1, s2 sounds, no murmurs / rubs / gallops. No extremity edema. 2+ pedal pulses. No carotid bruits.  ?Abdomen: no tenderness, no masses palpated. No hepatosplenomegaly. Bowel sounds positive.  ?Musculoskeletal: no clubbing / cyanosis. No joint deformity upper and lower extremities. Good ROM, no contractures. Normal muscle tone.  ?Skin: no rashes, lesions, ulcers. No induration ?Neurologic: CN 2-12 grossly intact. Sensation intact, DTR normal. Strength 5/5 in all 4.  ?Psychiatric: UTO judgment and insight. Obtunded.   ? ?Labs on Admission: I have personally reviewed following labs and imaging studies ? ?CBC: ?Recent Labs  ?Lab 11/03/21 ?1205 11/03/21 ?1232 11/04/21 ?4098 11/08/21 ?1133  ?WBC 4.4  --  4.0 10.4  ?NEUTROABS 3.4  --   --  8.7*  ?HGB 13.3 13.3 11.5* 12.8  ?HCT 41.5 39.0 37.1 38.2  ?MCV 108.4*  --  105.7* 103.8*  ?PLT 242  --  160 178  ? ?Basic Metabolic Panel: ?Recent Labs   ?Lab 11/03/21 ?1205 11/03/21 ?1232 11/04/21 ?1191 11/05/21 ?0800 11/08/21 ?1133  ?NA 137 139 135 135 134*  ?K 3.7 3.7 3.0* 3.6 4.0  ?CL 100 100 101 105 103  ?CO2 27  --  25 23 21*  ?GLUCOSE 108* 108* 171*

## 2021-11-08 NOTE — Hospital Course (Addendum)
66 year old female with longstanding history of alcohol abuse, alcoholic liver cirrhosis with esophageal varices, GI bleeding, lung cancer s/p lobectomy, hypertension, hyperlipidemia, type 2 diabetes mellitus, hypothyroidism, COPD, atrial fibrillation.  Patient has had 2 recent hospitalizations in the last month.  She has recently discharged from [facility] for COPD exacerbation and A-fib with RVR.  She was admitted recently at [facility] for acute hepatic encephalopathy and discharged on lactulose.  She discharged to a skilled nursing facility.  Apparently not receiving lactulose regularly and returned to the emergency department with hepatic encephalopathy with an elevated ammonia level of 214.  She was last noted to be normal yesterday according to the nursing home staff.  They went in to check on her this morning and found her to be obtunded and sent her to the emergency department.  When she was in the hospital she was receiving lactulose twice daily with 3-4 bowel movements per day.  Her ammonia levels had improved and her mentation had normalized to baseline.  ? ?Patient recently had an admission in February for upper GI bleeding with hematemesis.  She had an EGD on 09/24/2021 showing LA grade D esophagitis with no bleeding.  She has portal hypertensive gastropathy, few gastric polyps and a single nonbleeding angioectasia in the duodenum.  She was treated with APC and was placed on PPI for 8 weeks. ? ?Patient has history of recurrent paroxysmal atrial fibrillation with RVR managed medically with diltiazem and nadolol. ? ?Her ED work-up significant for CT scan brain with no acute findings.  ABG revealed a normal PCO2.  Ammonia level elevated at 214.  Her total bilirubin was 4.4.  Her lactic acid was 3.5.  Her platelets were 178.  She recently tested positive for THC.  Her CXR reveals a moderate right pleural effusion mostly unchanged from 3/15.   ? ?11/07/2021: full comfort measures. [facility] to come assess  for residential hospice bed, if not available will make GIP status. Family to meet with [facility] hospice at bedside.  Transition to GIP per hospice of [facility].   ?

## 2021-11-08 NOTE — Progress Notes (Signed)
Pt arrived to room 329 via stretcher from ED. Pt moved by staff from stretcher to bed. Pt completely unresponsive to all stimuli. Pt with loose stool noted on bed linens and adult diaper. Diaper removed to find flexiseal no longer in rectum and anchor balloon still fully inflated. Pt cleaned of stool, bed linens changed. Flexiseal reinserted. Purewick placed for urinary incontinence. Telemetry applied, NSR 70's. Bed alarm on and floor mats in place for safety. ?

## 2021-11-08 NOTE — TOC Progression Note (Signed)
Transition of Care (TOC) - Progression Note  ? ? ?Patient Details  ?Name: Sandy [name] ?MRN: [id number] ?Date of Birth: 1956-07-18 ? ?Transition of Care (TOC) CM/SW Contact  ?Boneta Lucks, RN ?Phone Number: ?11/08/2021, 6:48 PM ? ?Clinical Narrative:   Palliative and MD consulting [facility] for residential Hospice. Referral sent in the hub. TOC to follow in morning for bed status. ? ?

## 2021-11-08 NOTE — Progress Notes (Signed)
**Note De-identified  Obfuscation** ABG collected and delivered to lab ?

## 2021-11-08 NOTE — Assessment & Plan Note (Addendum)
Patient is status post right lumpectomy. ?She had a recent CTA chest done at [facility] showing increased consolidation to the right apex with follow-up planned with her oncologist [name].  Consult to palliative care appreciated. ?Full comfort measures.  ?

## 2021-11-08 NOTE — Progress Notes (Signed)
Palliative: ?Conference with attending, transition of care team related to patient need for referral to inpatient hospice. ? ?No charge ?Quinn Axe, NP ?Palliative medicine team ?Team phone [phone] ?Greater than 50% of this time was spent counseling and coordinating care related to the above assessment and plan. ?

## 2021-11-08 NOTE — Assessment & Plan Note (Addendum)
Sepsis ruled out.  ?Secondary to dehydration ?Full comfort measures started.  ?

## 2021-11-08 NOTE — Progress Notes (Signed)
11/08/2021 ?2:50 PM ? ?I spoke with patient's sister and son who agree that DNR is appropriate and they are open to discussing hospice care with palliative medicine team.  Requested palliative medicine consultation.  Sister asked about residential hospice.   ? ?C. Wynetta Emery, MD ?How to contact the [facility] Attending or Consulting provider [place] or covering provider during after hours [place], for this patient?  ?Check the care team in [facility] and look for a) attending/consulting TR[facility] provider listed and b) the [facility] team listed ?Log into [email or web address] and use 's universal password to access. If you do not have the password, please contact the hospital operator. ?Locate the [facility] provider you are looking for under [facility] and page to a number that you can be directly reached. ?If you still have difficulty reaching the provider, please page the [facility] (Director on Call) for the Hospitalists listed on amion for assistance. ? ?

## 2021-11-08 NOTE — ED Notes (Signed)
Patient remains unresponsive with eyes closed. Airway patent. Breathing within normal limits. VSS. No moaning or grimacing or any signs of pain or distress. Sister at bedside.  ?

## 2021-11-08 NOTE — Assessment & Plan Note (Addendum)
Full comfort measures °

## 2021-11-08 NOTE — Assessment & Plan Note (Addendum)
Pt is at end stage of disease and palliative consulted.   ?Transition to full comfort measures.  ?

## 2021-11-08 NOTE — Assessment & Plan Note (Addendum)
Lactulose enemas for now.  Markedly elevated ammonia from 2 days ago.  I spoke with sister and she tells me that patient had not been receiving lactulose at SNF like it was prescribed.  The sister found patient obtunded at SNF earlier today and called for help.       ?Palliative consultation for goals of care and family interested in discussing residential hospice options.   ?

## 2021-11-08 NOTE — Assessment & Plan Note (Signed)
Pt had been well controlled on diltiazem and nadolol.  ?She is not a candidate for anticoagulation due to poor complinance, ongoing alcohol abuse, chronic thrombocytopenia and chronic liver disease.  ?

## 2021-11-08 NOTE — Assessment & Plan Note (Addendum)
BPs are soft now.  Transitioning to full comfort measures.  ?

## 2021-11-08 NOTE — ED Triage Notes (Signed)
"  Staff went in to see patient this morning around 8am and found her unresponsive, she was last known normal yesterday" per EMS ?

## 2021-11-08 NOTE — Assessment & Plan Note (Signed)
I spoke with patient's sister and she said that she and patient's son have discussed this carefully and they feel that DNR is appropriate.  They are interested in speaking with palliative care and want to discuss residential hospice options.  Will continue DNR order while in hospital.   ?

## 2021-11-08 NOTE — Assessment & Plan Note (Addendum)
Secondary to right lobectomy.  Followed by Dr. Melvyn Novas pulmonologist.  ?

## 2021-11-08 NOTE — Assessment & Plan Note (Addendum)
Full comfort measures starting 11/18/2021 ?  ?

## 2021-11-09 ENCOUNTER — Encounter (HOSPITAL_COMMUNITY): Payer: Self-pay | Admitting: Family Medicine

## 2021-11-09 ENCOUNTER — Encounter: Payer: Self-pay | Admitting: Internal Medicine

## 2021-11-09 ENCOUNTER — Inpatient Hospital Stay (HOSPITAL_COMMUNITY)
Admission: RE | Admit: 2021-11-09 | Discharge: 2021-11-20 | DRG: 951 | Disposition: E | Source: Skilled Nursing Facility | Attending: Internal Medicine | Admitting: Internal Medicine

## 2021-11-09 DIAGNOSIS — J90 Pleural effusion, not elsewhere classified: Secondary | ICD-10-CM | POA: Diagnosis present

## 2021-11-09 DIAGNOSIS — K7031 Alcoholic cirrhosis of liver with ascites: Secondary | ICD-10-CM | POA: Diagnosis present

## 2021-11-09 DIAGNOSIS — R0609 Other forms of dyspnea: Secondary | ICD-10-CM

## 2021-11-09 DIAGNOSIS — J439 Emphysema, unspecified: Secondary | ICD-10-CM | POA: Diagnosis present

## 2021-11-09 DIAGNOSIS — Z8701 Personal history of pneumonia (recurrent): Secondary | ICD-10-CM

## 2021-11-09 DIAGNOSIS — B9561 Methicillin susceptible Staphylococcus aureus infection as the cause of diseases classified elsewhere: Secondary | ICD-10-CM | POA: Diagnosis present

## 2021-11-09 DIAGNOSIS — N390 Urinary tract infection, site not specified: Secondary | ICD-10-CM | POA: Diagnosis present

## 2021-11-09 DIAGNOSIS — K766 Portal hypertension: Secondary | ICD-10-CM | POA: Diagnosis present

## 2021-11-09 DIAGNOSIS — E119 Type 2 diabetes mellitus without complications: Secondary | ICD-10-CM | POA: Diagnosis present

## 2021-11-09 DIAGNOSIS — E039 Hypothyroidism, unspecified: Secondary | ICD-10-CM | POA: Diagnosis present

## 2021-11-09 DIAGNOSIS — K3189 Other diseases of stomach and duodenum: Secondary | ICD-10-CM | POA: Diagnosis present

## 2021-11-09 DIAGNOSIS — K2100 Gastro-esophageal reflux disease with esophagitis, without bleeding: Secondary | ICD-10-CM | POA: Diagnosis present

## 2021-11-09 DIAGNOSIS — K7682 Hepatic encephalopathy: Secondary | ICD-10-CM | POA: Diagnosis present

## 2021-11-09 DIAGNOSIS — K7030 Alcoholic cirrhosis of liver without ascites: Secondary | ICD-10-CM | POA: Diagnosis present

## 2021-11-09 DIAGNOSIS — B961 Klebsiella pneumoniae [K. pneumoniae] as the cause of diseases classified elsewhere: Secondary | ICD-10-CM | POA: Diagnosis present

## 2021-11-09 DIAGNOSIS — K7210 Chronic hepatic failure without coma: Secondary | ICD-10-CM | POA: Diagnosis present

## 2021-11-09 DIAGNOSIS — Z7189 Other specified counseling: Secondary | ICD-10-CM | POA: Diagnosis not present

## 2021-11-09 DIAGNOSIS — Z8719 Personal history of other diseases of the digestive system: Secondary | ICD-10-CM | POA: Diagnosis not present

## 2021-11-09 DIAGNOSIS — R7881 Bacteremia: Secondary | ICD-10-CM | POA: Diagnosis present

## 2021-11-09 DIAGNOSIS — Z66 Do not resuscitate: Secondary | ICD-10-CM | POA: Diagnosis present

## 2021-11-09 DIAGNOSIS — Z90710 Acquired absence of both cervix and uterus: Secondary | ICD-10-CM

## 2021-11-09 DIAGNOSIS — I10 Essential (primary) hypertension: Secondary | ICD-10-CM | POA: Diagnosis present

## 2021-11-09 DIAGNOSIS — E872 Acidosis, unspecified: Secondary | ICD-10-CM | POA: Diagnosis present

## 2021-11-09 DIAGNOSIS — Z515 Encounter for palliative care: Principal | ICD-10-CM

## 2021-11-09 DIAGNOSIS — I8510 Secondary esophageal varices without bleeding: Secondary | ICD-10-CM | POA: Diagnosis present

## 2021-11-09 DIAGNOSIS — Z85118 Personal history of other malignant neoplasm of bronchus and lung: Secondary | ICD-10-CM

## 2021-11-09 DIAGNOSIS — E785 Hyperlipidemia, unspecified: Secondary | ICD-10-CM | POA: Diagnosis present

## 2021-11-09 DIAGNOSIS — Z886 Allergy status to analgesic agent status: Secondary | ICD-10-CM

## 2021-11-09 DIAGNOSIS — Z885 Allergy status to narcotic agent status: Secondary | ICD-10-CM

## 2021-11-09 DIAGNOSIS — Z8249 Family history of ischemic heart disease and other diseases of the circulatory system: Secondary | ICD-10-CM

## 2021-11-09 DIAGNOSIS — I480 Paroxysmal atrial fibrillation: Secondary | ICD-10-CM | POA: Diagnosis present

## 2021-11-09 DIAGNOSIS — Z881 Allergy status to other antibiotic agents status: Secondary | ICD-10-CM

## 2021-11-09 DIAGNOSIS — F1010 Alcohol abuse, uncomplicated: Secondary | ICD-10-CM | POA: Diagnosis present

## 2021-11-09 DIAGNOSIS — E43 Unspecified severe protein-calorie malnutrition: Secondary | ICD-10-CM | POA: Diagnosis present

## 2021-11-09 DIAGNOSIS — Z87891 Personal history of nicotine dependence: Secondary | ICD-10-CM

## 2021-11-09 DIAGNOSIS — R627 Adult failure to thrive: Secondary | ICD-10-CM | POA: Diagnosis present

## 2021-11-09 DIAGNOSIS — Z902 Acquired absence of lung [part of]: Secondary | ICD-10-CM

## 2021-11-09 DIAGNOSIS — M1990 Unspecified osteoarthritis, unspecified site: Secondary | ICD-10-CM | POA: Diagnosis present

## 2021-11-09 LAB — GLUCOSE, CAPILLARY
Glucose-Capillary: 138 mg/dL — ABNORMAL HIGH (ref 70–99)
Glucose-Capillary: 131 mg/dL — ABNORMAL HIGH (ref 70–99)

## 2021-11-09 LAB — COMPREHENSIVE METABOLIC PANEL
ALT: 43 U/L (ref 0–44)
AST: 36 U/L (ref 15–41)
Albumin: 2.2 g/dL — ABNORMAL LOW (ref 3.5–5.0)
Alkaline Phosphatase: 121 U/L (ref 38–126)
Anion gap: 9 (ref 5–15)
BUN: 32 mg/dL — ABNORMAL HIGH (ref 8–23)
CO2: 20 mmol/L — ABNORMAL LOW (ref 22–32)
Calcium: 8.3 mg/dL — ABNORMAL LOW (ref 8.9–10.3)
Chloride: 111 mmol/L (ref 98–111)
Creatinine, Ser: 0.62 mg/dL (ref 0.44–1.00)
GFR, Estimated: >60 mL/min (ref >60)
Glucose, Bld: 147 mg/dL — ABNORMAL HIGH (ref 70–99)
Potassium: 3.5 mmol/L (ref 3.5–5.1)
Sodium: 140 mmol/L (ref 135–145)
Total Bilirubin: 5 mg/dL — ABNORMAL HIGH (ref 0.3–1.2)
Total Protein: 5.7 g/dL — ABNORMAL LOW (ref 6.5–8.1)

## 2021-11-09 LAB — BLOOD CULTURE ID PANEL (REFLEXED) - BCID2

## 2021-11-09 LAB — CBC
HCT: 38.1 % (ref 36.0–46.0)
Hemoglobin: 11.8 g/dL — ABNORMAL LOW (ref 12.0–15.0)
MCH: 32.7 pg (ref 26.0–34.0)
MCHC: 31 g/dL (ref 30.0–36.0)
MCV: 105.5 fL — ABNORMAL HIGH (ref 80.0–100.0)
Platelets: 143 10*3/uL — ABNORMAL LOW (ref 150–400)
RBC: 3.61 MIL/uL — ABNORMAL LOW (ref 3.87–5.11)
RDW: 14.8 % (ref 11.5–15.5)
WBC: 8.5 10*3/uL (ref 4.0–10.5)
nRBC: 0 % (ref 0.0–0.2)

## 2021-11-09 LAB — PROTIME-INR
INR: 1.6 — ABNORMAL HIGH (ref 0.8–1.2)
Prothrombin Time: 18.8 seconds — ABNORMAL HIGH (ref 11.4–15.2)

## 2021-11-09 LAB — MAGNESIUM: Magnesium: 1.8 mg/dL (ref 1.7–2.4)

## 2021-11-09 MED ORDER — GLYCOPYRROLATE 0.2 MG/ML IJ SOLN: 0.2000 mg | INTRAMUSCULAR | Status: DC | PRN

## 2021-11-09 MED ORDER — POLYVINYL ALCOHOL 1.4 % OP SOLN: 1.0000 [drp] | Freq: Four times a day (QID) | OPHTHALMIC | Status: DC | PRN

## 2021-11-09 MED ORDER — HALOPERIDOL LACTATE 5 MG/ML IJ SOLN: 0.5000 mg | INTRAMUSCULAR | Status: DC | PRN

## 2021-11-09 MED ORDER — BIOTENE DRY MOUTH MT LIQD: 15.0000 mL | OROMUCOSAL | Status: DC | PRN

## 2021-11-09 MED ORDER — HALOPERIDOL 0.5 MG PO TABS: 0.5000 mg | ORAL_TABLET | ORAL | Status: DC | PRN

## 2021-11-09 MED ORDER — ONDANSETRON 4 MG PO TBDP: 4.0000 mg | ORAL_TABLET | Freq: Four times a day (QID) | ORAL | Status: DC | PRN

## 2021-11-09 MED ORDER — ACETAMINOPHEN 650 MG RE SUPP: 650.0000 mg | Freq: Four times a day (QID) | RECTAL | Status: DC | PRN

## 2021-11-09 MED ORDER — GLYCOPYRROLATE 1 MG PO TABS: 1.0000 mg | ORAL_TABLET | ORAL | Status: DC | PRN

## 2021-11-09 MED ORDER — MORPHINE SULFATE (CONCENTRATE) 10 MG/0.5ML PO SOLN: 2.6000 mg | ORAL | Status: DC | PRN

## 2021-11-09 MED ORDER — ACETAMINOPHEN 325 MG PO TABS: 650.0000 mg | ORAL_TABLET | Freq: Four times a day (QID) | ORAL | Status: DC | PRN

## 2021-11-09 MED ORDER — HALOPERIDOL LACTATE 5 MG/ML IJ SOLN
0.5000 mg | INTRAMUSCULAR | Status: DC | PRN
Start: 2021-11-09 — End: 2021-11-10

## 2021-11-09 MED ORDER — HALOPERIDOL LACTATE 2 MG/ML PO CONC
0.5000 mg | ORAL | Status: DC | PRN
  Filled 2021-11-09: qty 0.3

## 2021-11-09 MED ORDER — LORAZEPAM 2 MG/ML IJ SOLN: 1.0000 mg | Freq: Four times a day (QID) | INTRAMUSCULAR | Status: DC | PRN

## 2021-11-09 MED ORDER — ONDANSETRON HCL 4 MG/2ML IJ SOLN: 4.0000 mg | Freq: Four times a day (QID) | INTRAMUSCULAR | Status: DC | PRN

## 2021-11-09 MED ORDER — GLYCOPYRROLATE 0.2 MG/ML IJ SOLN
0.2000 mg | INTRAMUSCULAR | Status: DC | PRN
Start: 2021-11-09 — End: 2021-11-10

## 2021-11-09 MED ORDER — LORAZEPAM 2 MG/ML IJ SOLN
1.0000 mg | Freq: Four times a day (QID) | INTRAMUSCULAR | Status: DC | PRN
  Administered 2021-11-12 – 2021-11-14 (×4): 2 mg via INTRAVENOUS
  Filled 2021-11-09 (×4): qty 1

## 2021-11-09 NOTE — Assessment & Plan Note (Signed)
Full comfort measures °

## 2021-11-09 NOTE — Progress Notes (Signed)
Patients mews continues to ranges bet 3-4 due to patients LOC and low BP's  She  has been obtunded. Provider and family aware. Palliative consulted and discussions with family made about hospice care. I have been re-taking blood pressures manually as there appears to be a slight difference when performed manually (slightly higher) I will continue to monitor patient has the shift progresses.  ?

## 2021-11-09 NOTE — Discharge Summary (Signed)
Physician Discharge Summary  ?Sandy [name] WPY:[id number] DOB: March 08, 1956 DOA: 11/08/2021 ? ?PCP: [name], MD ? ?Admit date: 11/08/2021 ?Discharge date: 10/23/2021 ? ?Disposition: HOSPICE ? ?Discharge Condition: HOSPICE  ?CODE STATUS: DNR  ?DIET: comfort  ? ?Brief Hospitalization Summary: ?Please see all hospital notes, images, labs for full details of the hospitalization. ?66 year old female with longstanding history of alcohol abuse, alcoholic liver cirrhosis with esophageal varices, GI bleeding, lung cancer s/p lobectomy, hypertension, hyperlipidemia, type 2 diabetes mellitus, hypothyroidism, COPD, atrial fibrillation.  Patient has had 2 recent hospitalizations in the last month.  She has recently discharged from [facility] for COPD exacerbation and A-fib with RVR.  She was admitted recently at [facility] for acute hepatic encephalopathy and discharged on lactulose.  She discharged to a skilled nursing facility.  Apparently not receiving lactulose regularly and returned to the emergency department with hepatic encephalopathy with an elevated ammonia level of 214.  She was last noted to be normal yesterday according to the nursing home staff.  They went in to check on her this morning and found her to be obtunded and sent her to the emergency department.  When she was in the hospital she was receiving lactulose twice daily with 3-4 bowel movements per day.  Her ammonia levels had improved and her mentation had normalized to baseline.  ? ?Patient recently had an admission in February for upper GI bleeding with hematemesis.  She had an EGD on 09/24/2021 showing LA grade D esophagitis with no bleeding.  She has portal hypertensive gastropathy, few gastric polyps and a single nonbleeding angioectasia in the duodenum.  She was treated with APC and was placed on PPI for 8 weeks. ? ?Patient has history of recurrent paroxysmal atrial fibrillation with RVR managed medically with diltiazem and  nadolol. ? ?Her ED work-up significant for CT scan brain with no acute findings.  ABG revealed a normal PCO2.  Ammonia level elevated at 214.  Her total bilirubin was 4.4.  Her lactic acid was 3.5.  Her platelets were 178.  She recently tested positive for THC.  Her CXR reveals a moderate right pleural effusion mostly unchanged from 3/15.   ? ?10/23/2021: full comfort measures. [facility] to come assess for residential hospice bed, if not available will make GIP status. Family to meet with [facility] hospice at bedside.  Transition to GIP per hospice of [facility].   ? ?Assessment and Plan: ?* Acute hepatic encephalopathy ?Lactulose enemas for now.  Markedly elevated ammonia from 2 days ago.  I spoke with sister and she tells me that patient had not been receiving lactulose at SNF like it was prescribed.  The sister found patient obtunded at SNF earlier today and called for help.       ?Palliative consultation for goals of care and family interested in discussing residential hospice options.   ? ?Bacteremia due to Gram-negative bacteria ?Pt now transitioning to full comfort care.  ? ?DNR (do not resuscitate) ?I spoke with patient's sister and she said that she and patient's son have discussed this carefully and they feel that DNR is appropriate.  They are interested in speaking with palliative care and want to discuss residential hospice options.  Will continue DNR order while in hospital.   ? ?Lactic acidosis ?Sepsis ruled out.  ?Secondary to dehydration ?Full comfort measures started.  ? ?History of esophageal varices ?Full comfort measures ? ?Alcoholic cirrhosis ([place]) ?Pt is at end stage of disease and palliative consulted.   ?Transition  to full comfort measures.  ? ?Solitary pulmonary nodule on lung CT ?Patient is followed by [name] her oncologist with follow-up planned April 2023. ? ?Protein-calorie malnutrition, severe ?Full comfort measures started.  ? ?Hypothyroidism ?Full comfort measures starting 10/21/2021 ?  ? ?Paroxysmal  atrial fibrillation (HCC) ?Pt had been well controlled on diltiazem and nadolol.  ?She is not a candidate for anticoagulation due to poor complinance, ongoing alcohol abuse, chronic thrombocytopenia and chronic liver disease.  ? ?History of lung cancer ?Patient is status post right lumpectomy. ?She had a recent CTA chest done at [facility] showing increased consolidation to the right apex with follow-up planned with her oncologist [name].  Consult to palliative care appreciated. ?Full comfort measures.  ? ?Essential hypertension ?BPs are soft now.  Transitioning to full comfort measures.  ? ?DOE (dyspnea on exertion) ?Secondary to right lobectomy.  Followed by Dr. Melvyn Novas pulmonologist.  ? ?Type 2 diabetes mellitus ([place]) ?Full comfort measures started.  ? ?Discharge Diagnoses:  ?Principal Problem: ?  Acute hepatic encephalopathy ?Active Problems: ?  Solitary pulmonary nodule on lung CT ?  Alcoholic cirrhosis ([place]) ?  History of esophageal varices ?  Lactic acidosis ?  DNR (do not resuscitate) ?  Bacteremia due to Gram-negative bacteria ?  Type 2 diabetes mellitus ([place]) ?  DOE (dyspnea on exertion) ?  Essential hypertension ?  History of lung cancer ?  Paroxysmal atrial fibrillation (HCC) ?  Hypothyroidism ?  Protein-calorie malnutrition, severe ? ? ?Procedures/Studies: ?DG Chest 1 View ? ?Result Date: 11/03/2021 ?CLINICAL DATA:  Altered mental status.  Foul-smelling urine. EXAM: CHEST  1 VIEW COMPARISON:  10/30/2021 FINDINGS: Patient is rotated to the right. Circumferential pleural thickening and volume loss in the right hemithorax is similar to prior with soft tissue fullness in the right hilum and right base collapse/consolidative opacity with pleural effusion. Left lung remains clear. The visualized bony structures of the thorax are unremarkable. IMPRESSION: Right base collapse/consolidative opacity with right pleural effusion, similar to prior. Electronically Signed   By: Misty Stanley M.D.   On:  11/03/2021 13:40  ? ?DG Chest 1 View ? ?Result Date: 10/30/2021 ?CLINICAL DATA:  Shortness of breath. EXAM: CHEST  1 VIEW COMPARISON:  Chest radiograph dated 10/29/2021 and CT dated 10/29/2021 FINDINGS: Similar appearance of the right lung with volume loss and architectural distortion and small effusion. The left lung remains clear. No pneumothorax. There is shift of the mediastinum to the right similar to prior radiograph. Stable cardiomediastinal silhouette. No acute osseous pathology. IMPRESSION: No interval change. Electronically Signed   By: Anner Crete M.D.   On: 10/30/2021 21:45  ? ?CT Head Wo Contrast ? ?Result Date: 11/08/2021 ?CLINICAL DATA:  Altered mental status EXAM: CT HEAD WITHOUT CONTRAST TECHNIQUE: Contiguous axial images were obtained from the base of the skull through the vertex without intravenous contrast. RADIATION DOSE REDUCTION: This exam was performed according to the departmental dose-optimization program which includes automated exposure control, adjustment of the mA and/or kV according to patient size and/or use of iterative reconstruction technique. COMPARISON:  11/03/2021 FINDINGS: Brain: No acute intracranial findings are seen. Ventricles are not dilated. There is no focal mass effect. Cortical sulci are prominent. Vascular: Unremarkable. Skull: No fracture is seen in the calvarium. Sinuses/Orbits: Unremarkable. Other: No significant interval changes are noted. IMPRESSION: No acute intracranial findings are seen in noncontrast CT brain. Atrophy. Electronically Signed   By: Elmer Picker M.D.   On: 11/08/2021 12:00  ? ?CT HEAD WO CONTRAST ? ?  Result Date: 11/03/2021 ?CLINICAL DATA:  Altered mental status EXAM: CT HEAD WITHOUT CONTRAST TECHNIQUE: Contiguous axial images were obtained from the base of the skull through the vertex without intravenous contrast. RADIATION DOSE REDUCTION: This exam was performed according to the departmental dose-optimization program which includes  automated exposure control, adjustment of the mA and/or kV according to patient size and/or use of iterative reconstruction technique. COMPARISON:  MR brain done on 01/28/2019 FINDINGS: Brain: No acute intracranial find

## 2021-11-09 NOTE — Assessment & Plan Note (Signed)
Pt is at end stage liver disease and after discussing with GI not a candidate for liver transplant given ongoing alcohol abuse.  She has been transitioned to full comfort measures.   ?

## 2021-11-09 NOTE — Assessment & Plan Note (Addendum)
Previously on diltiazem and nadolol ?No longer active issue as pt transitioned to full comfort measures ?

## 2021-11-09 NOTE — Plan of Care (Signed)
  Problem: Education: Goal: Knowledge of General Education information will improve Description Including pain rating scale, medication(s)/side effects and non-pharmacologic comfort measures Outcome: Progressing   

## 2021-11-09 NOTE — Assessment & Plan Note (Addendum)
From end stage liver disease, now transitioning to full comfort measures ?3/20 blood cultures MSSA ?Antibiotics not started as pt was transitioned to full comfort care ?

## 2021-11-09 NOTE — Progress Notes (Signed)
Palliative: ?Sandy [name] is lying quietly in bed.  She appears acutely/chronically ill and quite frail.  She does not respond in any meaningful way to voice or touch.  She cannot make her basic needs known.  There is no family at bedside at this time. ? ?Family has elected comfort care, hospice care.  Overall, Sandy [name] appears comfortable.  ?Call to sister/healthcare surrogate, Sandy [name].  We talk about prognosis with permission, days anticipated.  We talk about full comfort care, and burdening Sandy [name] from treatments that are painful and are not changing what is happening.  We talk about hospice referral.  Sandy [name] shares that she is to meet the hospice representative at bedside around 1:30 today.  We talk about comfort and dignity at end-of-life, let nature take its course.  Sandy [name] shares that she and Sandy [name]'s family are agreeable to full comfort care, orders adjusted.  End-of-life order set implemented. ? ?Conference with transition of care team who shares that [name] will be arriving today for inpatient/GIP evaluation. ? ?Plan: Comfort and dignity at end-of-life, let nature take its course.  Referral to hospice of [facility] for inpatient placement. ? ?50 minutes  ?Quinn Axe, NP ?Palliative medicine team ?Team phone [phone] ?Greater than 50% of this time was spent counseling and coordinating care related to the above assessment and plan. ?

## 2021-11-09 NOTE — Assessment & Plan Note (Signed)
Pt now transitioning to full comfort care.  ?

## 2021-11-09 NOTE — Assessment & Plan Note (Addendum)
Pt admitted to [facility] for full comfort measures.  ?11/11/21:  Remains stable for transfer to residential hospice;  Expected life expectancy < 2 weeks ?

## 2021-11-09 NOTE — Assessment & Plan Note (Signed)
From end stage liver disease now on full comfort care ?

## 2021-11-09 NOTE — Progress Notes (Signed)
Assumed care of patient at 0700. Patient has ben a 3/4 on her MEWS all previous shift. PM and AM charge nurse aware. Will continue to monitor patient during my shift. ?

## 2021-11-09 NOTE — Progress Notes (Signed)
Date and time results received: 11/06/2021 3:02 PM ? ?(use smartphrase ".now" to insert current time) ? ?Test: Blood culture ?Critical Value: gram negative rods ? ?Name of Provider Notified: Dr. Wynetta Emery ? ?Orders Received? Or Actions Taken?:  ?

## 2021-11-09 NOTE — Progress Notes (Signed)
?PROGRESS NOTE ? ? ?Sandy [name]  NGE:[id number] DOB: Aug 20, 1956 DOA: 11/08/2021 ?PCP: [name], MD  ? ?Chief Complaint  ?Patient presents with  ? Altered Mental Status  ? ?Level of care: Med-Surg ? ?Brief Admission History:  ?66 year old female with longstanding history of alcohol abuse, alcoholic liver cirrhosis with esophageal varices, GI bleeding, lung cancer s/p lobectomy, hypertension, hyperlipidemia, type 2 diabetes mellitus, hypothyroidism, COPD, atrial fibrillation.  Patient has had 2 recent hospitalizations in the last month.  She has recently discharged from [facility] for COPD exacerbation and A-fib with RVR.  She was admitted recently at [facility] for acute hepatic encephalopathy and discharged on lactulose.  She discharged to a skilled nursing facility.  Apparently not receiving lactulose regularly and returned to the emergency department with hepatic encephalopathy with an elevated ammonia level of 214.  She was last noted to be normal yesterday according to the nursing home staff.  They went in to check on her this morning and found her to be obtunded and sent her to the emergency department.  When she was in the hospital she was receiving lactulose twice daily with 3-4 bowel movements per day.  Her ammonia levels had improved and her mentation had normalized to baseline.  ? ?Patient recently had an admission in February for upper GI bleeding with hematemesis.  She had an EGD on 09/24/2021 showing LA grade D esophagitis with no bleeding.  She has portal hypertensive gastropathy, few gastric polyps and a single nonbleeding angioectasia in the duodenum.  She was treated with APC and was placed on PPI for 8 weeks. ? ?Patient has history of recurrent paroxysmal atrial fibrillation with RVR managed medically with diltiazem and nadolol. ? ?Her ED work-up significant for CT scan brain with no acute findings.  ABG revealed a normal PCO2.  Ammonia level elevated at 214.  Her total  bilirubin was 4.4.  Her lactic acid was 3.5.  Her platelets were 178.  She recently tested positive for THC.  Her CXR reveals a moderate right pleural effusion mostly unchanged from 3/15.   ? ?11/13/2021: full comfort measures. [facility] to come assess for residential hospice bed, if not available will make GIP status. Family to meet with [facility] hospice at bedside.  ?  ?Assessment and Plan: ?* Acute hepatic encephalopathy ?Lactulose enemas for now.  Markedly elevated ammonia from 2 days ago.  I spoke with sister and she tells me that patient had not been receiving lactulose at SNF like it was prescribed.  The sister found patient obtunded at SNF earlier today and called for help.       ?Palliative consultation for goals of care and family interested in discussing residential hospice options.   ? ?DNR (do not resuscitate) ?I spoke with patient's sister and she said that she and patient's son have discussed this carefully and they feel that DNR is appropriate.  They are interested in speaking with palliative care and want to discuss residential hospice options.  Will continue DNR order while in hospital.   ? ?Lactic acidosis ?Sepsis ruled out.  ?Secondary to dehydration ?Full comfort measures started.  ? ?History of esophageal varices ?Full comfort measures ? ?Alcoholic cirrhosis ([place]) ?Pt is at end stage of disease and palliative consulted.   ?Transition to full comfort measures.  ? ?Solitary pulmonary nodule on lung CT ?Patient is followed by [name] her oncologist with follow-up planned April 2023. ? ?Protein-calorie malnutrition, severe ?Full comfort measures started.  ? ?Hypothyroidism ?Full  comfort measures starting 11/13/2021 ?  ? ?Paroxysmal atrial fibrillation (HCC) ?Pt had been well controlled on diltiazem and nadolol.  ?She is not a candidate for anticoagulation due to poor complinance, ongoing alcohol abuse, chronic thrombocytopenia and chronic liver disease.  ? ?History of lung cancer ?Patient is status post  right lumpectomy. ?She had a recent CTA chest done at [facility] showing increased consolidation to the right apex with follow-up planned with her oncologist [name].  Consult to palliative care appreciated. ?Full comfort measures.  ? ?Essential hypertension ?BPs are soft now.  Transitioning to full comfort measures.  ? ?DOE (dyspnea on exertion) ?Secondary to right lobectomy.  Followed by Dr. Melvyn Novas pulmonologist.  ? ?Type 2 diabetes mellitus ([place]) ?Full comfort measures started.  ? ?  ?Consultants:  ?Palliative  ?Procedures:  ? ?Antimicrobials:  ?  ?Subjective: ?Pt obtunded, unresponsive.  ?Objective: ?Vitals:  ? 11/08/21 2318 11/06/2021 0200 11/14/2021 0423 11/04/2021 1234  ?BP: (!) 96/50 (!) 105/58 (!) 121/57 132/66  ?Pulse: 65  68 79  ?Resp:    19  ?Temp:      ?TempSrc:      ?SpO2: 100%  100% 100%  ?Weight:      ?Height:      ? ? ?Intake/Output Summary (Last 24 hours) at 11/18/2021 1305 ?Last data filed at 10/30/2021 1236 ?Gross per 24 hour  ?Intake 2402.37 ml  ?Output 1800 ml  ?Net 602.37 ml  ? ?Filed Weights  ? 11/08/21 1129  ?Weight: 55 kg  ? ?Examination: ? ?General exam: Appears terminally ill.  ?Respiratory system: shallow breathing.  ?Cardiovascular system: normal S1 & S2 heard.  ?Gastrointestinal system: Abdomen is mildly distended, soft. ?Central nervous system: obtunded. Encephalopathic. ?Extremities: no gross lesions seen.  ?Skin: jaundiced.  ?Psychiatry: obtunded.  ? ?Data Reviewed: I have personally reviewed following labs and imaging studies ? ?CBC: ?Recent Labs  ?Lab 11/03/21 ?1205 11/03/21 ?1232 11/04/21 ?3664 11/08/21 ?1133 11/08/2021 ?0636  ?WBC 4.4  --  4.0 10.4 8.5  ?NEUTROABS 3.4  --   --  8.7*  --   ?HGB 13.3 13.3 11.5* 12.8 11.8*  ?HCT 41.5 39.0 37.1 38.2 38.1  ?MCV 108.4*  --  105.7* 103.8* 105.5*  ?PLT 242  --  160 178 143*  ? ? ?Basic Metabolic Panel: ?Recent Labs  ?Lab 11/03/21 ?1205 11/03/21 ?1232 11/04/21 ?4034 11/05/21 ?0800 11/08/21 ?1133 10/21/2021 ?0636  ?NA 137 139 135 135 134*  140  ?K 3.7 3.7 3.0* 3.6 4.0 3.5  ?CL 100 100 101 105 103 111  ?CO2 27  --  25 23 21* 20*  ?GLUCOSE 108* 108* 171* 144* 143* 147*  ?BUN 23 28* 18 24* 34* 32*  ?CREATININE 0.67 0.60 0.57 0.71 0.69 0.62  ?CALCIUM 8.9  --  7.8* 7.8* 8.0* 8.3*  ?MG  --   --  1.8 2.4 2.0 1.8  ? ? ?CBG: ?Recent Labs  ?Lab 11/06/21 ?1123 11/08/21 ?1720 11/08/21 ?2043 10/27/2021 ?0730 11/03/2021 ?1116  ?GLUCAP 136* 143* 143* 138* 131*  ? ? ?Recent Results (from the past 240 hour(s))  ?Resp Panel by RT-PCR (Flu A&B, Covid)     Status: None  ? Collection Time: 11/03/21 12:05 PM  ? Specimen: Nasopharyngeal(NP) swabs in vial transport medium  ?Result Value Ref Range Status  ? SARS Coronavirus 2 by RT PCR NEGATIVE NEGATIVE Final  ?  Comment: (NOTE) ?SARS-CoV-2 target nucleic acids are NOT DETECTED. ? ?The SARS-CoV-2 RNA is generally detectable in upper respiratory ?specimens during the acute phase of  infection. The lowest ?concentration of SARS-CoV-2 viral copies this assay can detect is ?138 copies/mL. A negative result does not preclude SARS-Cov-2 ?infection and should not be used as the sole basis for treatment or ?other patient management decisions. A negative result may occur with  ?improper specimen collection/handling, submission of specimen other ?than nasopharyngeal swab, presence of viral mutation(s) within the ?areas targeted by this assay, and inadequate number of viral ?copies(<138 copies/mL). A negative result must be combined with ?clinical observations, patient history, and epidemiological ?information. The expected result is Negative. ? ?Fact Sheet for Patients:  ?EntrepreneurPulse.com.au ? ?Fact Sheet for Healthcare Providers:  ?IncredibleEmployment.be ? ?This test is no t yet approved or cleared by the [place] FDA and  ?has been authorized for detection and/or diagnosis of SARS-CoV-2 by ?FDA under an Emergency Use Authorization (EUA). This EUA will remain  ?in effect (meaning this test can  be used) for the duration of the ?COVID-19 declaration under Section 564(b)(1) of the Act, 21 ?U.S.C.section 360bbb-3(b)(1), unless the authorization is terminated  ?or revoked sooner.  ? ? ?  ? Influenza A by

## 2021-11-09 NOTE — TOC Progression Note (Signed)
Transition of Care (TOC) - Progression Note  ? ? ?Patient Details  ?Name: Sandy [name] ?MRN: [id number] ?Date of Birth: 11/29/1955 ? ?Transition of Care (TOC) CM/SW Contact  ?Boneta Lucks, RN ?Phone Number: ?11/07/2021, 2:36 PM ? ?Clinical Narrative:   [facility] accepted, Consents signed with family. Patient in now full comfort care in GIP status. MD/RN aware to flip the chart.  ? ? ?Expected Discharge Plan: [name] ?Barriers to Discharge: Hospice Bed not available ? ?Expected Discharge Plan and Services ?Expected Discharge Plan: [name] ?  ?   ?Readmission Risk Interventions ?Readmission Risk Prevention Plan 11/06/2021 11/04/2021  ?Transportation Screening Complete Complete  ?PCP or Specialist Appt within 3-5 Days Complete -  ?[place] or Home Care Consult Complete Complete  ?Social Work Consult for [place] Planning/Counseling Complete Complete  ?Palliative Care Screening Complete Not Applicable  ?Medication Review Press photographer) Complete Complete  ?Some recent data might be hidden  ? ? ?

## 2021-11-09 NOTE — Hospital Course (Addendum)
66 year old female with longstanding history of alcohol abuse, alcoholic liver cirrhosis with esophageal varices, GI bleeding, lung cancer s/p lobectomy, hypertension, hyperlipidemia, type 2 diabetes mellitus, hypothyroidism, COPD, atrial fibrillation.  Patient has had 2 recent hospitalizations in the last month.  The patient was admitted from 10/29/2021 to 11/01/2021 with atrial fibrillation with RVR.  She was started on diltiazem and nadolol at the time of discharge.  She was also discharged home with a steroid taper secondary to her pleuritic chest pain at that time CTA of the chest was negative for PE during that admission.  Once again, she was readmitted from 11/03/2021 to 11/06/2021 for hepatic encephalopathy.  At that time, she was discharged to a skilled nursing facility.  With lactulose.   She was last noted to be normal on 11/07/2021 according to the nursing home staff.  They went in to check on her this morning and found her to be obtunded and sent her to the emergency department.  When she was in the hospital she was receiving lactulose twice daily with 3-4 bowel movements per day.  Her ammonia levels had improved and her mentation had normalized to baseline.  At the time of this admission on 11/08/2021, the patient was noted to have an ammonia level of 214. ?  ?Patient recently had an admission in February for upper GI bleeding with hematemesis.  She had an EGD on 09/24/2021 showing LA grade D esophagitis with no bleeding.  She has portal hypertensive gastropathy, few gastric polyps and a single nonbleeding angioectasia in the duodenum.  She was treated with APC and was placed on PPI for 8 weeks. ?  ?Her ED work-up significant for CT scan brain with no acute findings.  ABG revealed a normal PCO2.  Ammonia level elevated at 214.  Her total bilirubin was 4.4.  Her lactic acid was 3.5.  Her platelets were 178.  She recently tested positive for THC.  Her CXR reveals a moderate right pleural effusion mostly  unchanged from 3/15.   ?  ?11/01/2021: full comfort measures. [facility] to come assess for residential hospice bed, if not available will make GIP status. Family to meet with [facility] hospice at bedside.  Transition to GIP per hospice of [facility]. ? ?11/10/21: Patient remains in GIP status waiting for residential hospice bed.  Full comfort measures continues.  Patient is wake and occasionally answers yes/no questions.  No pain or sob. ? ?11/11/21:  Patient remains in GIP status.  Full comfort measures continues.  Patient is awake and occasionally answers yes/no but appears more somnolent today.  No pain or sob endorsed. ? ?11/12/21:  Patient remains in GIP.  No pain. Awake but not answering as much today. ? ?11/13/21: patient remains in GIP.  She is more somnolent today.  Only intermittently opens eyes, no responding to questions.  No respiratory distress or uncontrolled pain. ? ?11/14/21:  remains in GIP.  More somnolent, not responsive to voice.  Not opening eyes.  No respiratory distress or uncontrolled pain. ?

## 2021-11-09 NOTE — Assessment & Plan Note (Addendum)
Pt is critically ill and transitioned to full comfort measures.  ?Initially had lactate 3.5 ?

## 2021-11-09 NOTE — H&P (Addendum)
GIP History and Physical  [facility]  [name]:[id number] DOB: 24-Jul-1956 DOA: 11/09/2021  PCP: Kaleen Mask, MD  Patient coming from: acute care Level of care: GIP Inpatient hospice   I have personally briefly reviewed patient's old medical records in [facility]  Chief Complaint: Comfort care   HPI: Sandy [name] is a 66 year old female with longstanding history of alcohol abuse, alcoholic liver cirrhosis with esophageal varices, GI bleeding, lung cancer s/p lobectomy, hypertension, hyperlipidemia, type 2 diabetes mellitus, hypothyroidism, COPD, atrial fibrillation.  Patient has had 2 recent hospitalizations in the last month.  She has recently discharged from [facility] for COPD exacerbation and A-fib with RVR.  She was admitted recently at [facility] for acute hepatic encephalopathy and discharged on lactulose.  She discharged to a skilled nursing facility.  Apparently not receiving lactulose regularly and returned to the emergency department with hepatic encephalopathy with an elevated ammonia level of 214.  She was last noted to be normal yesterday according to the nursing home staff.  They went in to check on her this morning and found her to be obtunded and sent her to the emergency department.  When she was in the hospital she was receiving lactulose twice daily with 3-4 bowel movements per day.  Her ammonia levels had improved and her mentation had normalized to baseline.    Patient recently had an admission in February for upper GI bleeding with hematemesis.  She had an EGD on 09/24/2021 showing LA grade D esophagitis with no bleeding.  She has portal hypertensive gastropathy, few gastric polyps and a single nonbleeding angioectasia in the duodenum.  She was treated with APC and was placed on PPI for 8 weeks.   Patient has history of recurrent paroxysmal atrial fibrillation with RVR managed medically with diltiazem and nadolol.   Her ED work-up  significant for CT scan brain with no acute findings.  ABG revealed a normal PCO2.  Ammonia level elevated at 214.  Her total bilirubin was 4.4.  Her lactic acid was 3.5.  Her platelets were 178.  She recently tested positive for THC.  Her CXR reveals a moderate right pleural effusion mostly unchanged from 3/15.     11/09/2021: full comfort measures. [facility] to come assess for residential hospice bed, if not available will make GIP status. Family to meet with [facility] hospice at bedside.  Transition to GIP per hospice of [facility].  Review of Systems: Review of Systems  Unable to perform ROS: Patient unresponsive    Past Medical History:  Diagnosis Date   Adenocarcinoma of lung, stage 3, right (HCC) 2020   Arthritis    Asthma    Cirrhosis (HCC)    COPD (chronic obstructive pulmonary disease) (HCC)    Dyslipidemia    Dyspnea    Esophageal varices (HCC)    History of blood transfusion    History of hiatal hernia    Hypertension    Malignant neoplasm of bronchus of lower lobe, right (HCC) 01/10/2019   Pneumonia    Type 2 diabetes mellitus (HCC)    Type II    Past Surgical History:  Procedure Laterality Date   ABDOMINAL HYSTERECTOMY     ANTERIOR CRUCIATE LIGAMENT REPAIR Left    x 2   ESOPHAGOGASTRODUODENOSCOPY     ESOPHAGOGASTRODUODENOSCOPY Left 09/24/2021   Procedure: ESOPHAGOGASTRODUODENOSCOPY (EGD);  Surgeon: Imogene Burn, MD;  Location: [facility] ENDOSCOPY;  Service: Gastroenterology;  Laterality: Left;   HEMOSTASIS CONTROL  09/24/2021   Procedure: HEMOSTASIS CONTROL;  Surgeon: Imogene Burn, MD;  Location: [facility] ENDOSCOPY;  Service: Gastroenterology;;   LOBECTOMY Right 01/07/2019   Procedure: RIGHT LOWER LOBE LOBECTOMY AND INTERCOSTAL NERVE BLOCK;  Surgeon: Loreli Slot, MD;  Location[facility] OR;  Service: Thoracic;  Laterality: Right;   VIDEO ASSISTED THORACOSCOPY (VATS)/WEDGE RESECTION Right 01/07/2019   Procedure: VIDEO ASSISTED THORACOSCOPY (VATS) RIGHT LOWER LOBE WEDGE RESECTION;  Surgeon:  Loreli Slot, MD;  Location: [facility] OR;  Service: Thoracic;  Laterality: Right;     reports that she quit smoking about 7 years ago. Her smoking use included cigarettes. She has a 37.00 pack-year smoking history. She has never used smokeless tobacco. She reports that she does not currently use alcohol after a past usage of about 1.0 standard drink per week. She reports that she does not currently use drugs after having used the following drugs: Marijuana.  Allergies  Allergen Reactions   Codeine Nausea And Vomiting   Nsaids     Avoid due to esophageal varices   Ceftriaxone Rash    Family History  Problem Relation Age of Onset   Leukemia Mother    Parkinson's disease Father    Hypertension Sister    Lung disease Neg Hx     Prior to Admission medications   Not on File    Physical Exam: There were no vitals filed for this visit.  Constitutional: pt appears terminally ill, but appears comfortable, no apparent distress.  Pt is jaundiced and appears much older than stated age from chronic illness.  Eyes: PERRL, lids and conjunctivae normal ENMT: Mucous membranes are very dry. . Posterior pharynx clear of any exudate or lesions.Normal dentition.  Neck: normal, supple, no masses, no thyromegaly Respiratory: shallow breathing bilateral.  Cardiovascular: normal s1, s2 sounds.   Abdomen: mildly distended.  Musculoskeletal:curled in fetal position.   Skin: no rashes, lesions, ulcers. No induration Neurologic: unresponsive  Psychiatric: unresponsive  Labs on Admission: I have personally reviewed following labs and imaging studies  CBC: Recent Labs  Lab 11/03/21 1205 11/03/21 1232 11/04/21 0526 11/08/21 1133 11/09/21 0636  WBC 4.4  --  4.0 10.4 8.5  NEUTROABS 3.4  --   --  8.7*  --   HGB 13.3 13.3 11.5* 12.8 11.8*  HCT 41.5 39.0 37.1 38.2 38.1  MCV 108.4*  --  105.7* 103.8* 105.5*  PLT 242  --  160 178 143*   Basic Metabolic Panel: Recent Labs  Lab 11/03/21 1205  11/03/21 1232 11/04/21 0526 11/05/21 0800 11/08/21 1133 11/09/21 0636  NA 137 139 135 135 134* 140  K 3.7 3.7 3.0* 3.6 4.0 3.5  CL 100 100 101 105 103 111  CO2 27  --  25 23 21* 20*  GLUCOSE 108* 108* 171* 144* 143* 147*  BUN 23 28* 18 24* 34* 32*  CREATININE 0.67 0.60 0.57 0.71 0.69 0.62  CALCIUM 8.9  --  7.8* 7.8* 8.0* 8.3*  MG  --   --  1.8 2.4 2.0 1.8   GFR: Estimated Creatinine Clearance: 58 mL/min (by C-G formula based on SCr of 0.62 mg/dL). Liver Function Tests: Recent Labs  Lab 11/03/21 1205 11/04/21 0526 11/08/21 1133 11/09/21 0636  AST 141* 92* 44* 36  ALT 88* 75* 49* 43  ALKPHOS 139* 120 122 121  BILITOT 4.2* 3.8* 4.4* 5.0*  PROT 7.5 5.8* 5.9* 5.7*  ALBUMIN 2.8* 2.3* 2.2* 2.2*   Recent Labs  Lab 11/08/21 1133  LIPASE 47   Recent  Labs  Lab 11/03/21 1207 11/04/21 0526 11/05/21 0527 11/08/21 1133  AMMONIA 53* 42* 53* 214*   Coagulation Profile: Recent Labs  Lab 11/03/21 1205 11/08/21 1413 11/09/21 0636  INR 1.4* 1.6* 1.6*   Cardiac Enzymes: No results for input(s): CKTOTAL, CKMB, CKMBINDEX, TROPONINI in the last 168 hours. BNP (last 3 results) No results for input(s): PROBNP in the last 8760 hours. HbA1C: No results for input(s): HGBA1C in the last 72 hours. CBG: Recent Labs  Lab 11/06/21 1123 11/08/21 1720 11/08/21 2043 11/09/21 0730 11/09/21 1116  GLUCAP 136* 143* 143* 138* 131*   Lipid Profile: No results for input(s): CHOL, HDL, LDLCALC, TRIG, CHOLHDL, LDLDIRECT in the last 72 hours. Thyroid Function Tests: No results for input(s): TSH, T4TOTAL, FREET4, T3FREE, THYROIDAB in the last 72 hours. Anemia Panel: No results for input(s): VITAMINB12, FOLATE, FERRITIN, TIBC, IRON, RETICCTPCT in the last 72 hours. Urine analysis:    Component Value Date/Time   COLORURINE YELLOW 11/08/2021 1156   APPEARANCEUR CLEAR 11/08/2021 1156   LABSPEC 1.018 11/08/2021 1156   PHURINE 6.0 11/08/2021 1156   GLUCOSEU NEGATIVE 11/08/2021 1156    HGBUR NEGATIVE 11/08/2021 1156   BILIRUBINUR NEGATIVE 11/08/2021 1156   KETONESUR NEGATIVE 11/08/2021 1156   PROTEINUR NEGATIVE 11/08/2021 1156   NITRITE NEGATIVE 11/08/2021 1156   LEUKOCYTESUR MODERATE (A) 11/08/2021 1156    Radiological Exams on Admission: CT Head Wo Contrast  Result Date: 11/08/2021 CLINICAL DATA:  Altered mental status EXAM: CT HEAD WITHOUT CONTRAST TECHNIQUE: Contiguous axial images were obtained from the base of the skull through the vertex without intravenous contrast. RADIATION DOSE REDUCTION: This exam was performed according to the departmental dose-optimization program which includes automated exposure control, adjustment of the mA and/or kV according to patient size and/or use of iterative reconstruction technique. COMPARISON:  11/03/2021 FINDINGS: Brain: No acute intracranial findings are seen. Ventricles are not dilated. There is no focal mass effect. Cortical sulci are prominent. Vascular: Unremarkable. Skull: No fracture is seen in the calvarium. Sinuses/Orbits: Unremarkable. Other: No significant interval changes are noted. IMPRESSION: No acute intracranial findings are seen in noncontrast CT brain. Atrophy. Electronically Signed   By: Ernie Avena M.D.   On: 11/08/2021 12:00   DG Chest Port 1 View  Result Date: 11/08/2021 CLINICAL DATA:  Altered mental status, lung carcinoma EXAM: PORTABLE CHEST 1 VIEW COMPARISON:  Previous studies including the examination done on 11/03/2021 FINDINGS: There is decreased volume in right lung. There is moderate right pleural effusion part of which appears to be loculated along the lateral aspect of the right lung. There is shift of mediastinum to the right. Left lung is clear. Left lateral CP angle is clear. There is no pneumothorax. IMPRESSION: Decreased volume in right lung may be related to previous partial resection. There is infiltrate in the right lung suggesting atelectasis/pneumonia. Moderate right pleural effusion.  Overall, no significant interval changes are noted since 11/03/2021. Electronically Signed   By: Ernie Avena M.D.   On: 11/08/2021 11:57    Assessment/Plan Principal Problem:   Comfort measures only status Active Problems:   Alcoholic cirrhosis (HCC)   History of esophageal varices   Acute hepatic encephalopathy   Lactic acidosis   DNR (do not resuscitate)   Bacteremia due to Gram-negative bacteria   FTT (failure to thrive) in adult   Type 2 diabetes mellitus (HCC)   History of lung cancer   Paroxysmal atrial fibrillation (HCC)   Protein-calorie malnutrition, severe   Hypothyroidism   Goals of care, counseling/discussion  Assessment and Plan: * Comfort measures only status Pt admitted to [facility] for full comfort measures.   Bacteremia due to Gram-negative bacteria From end stage liver disease, now transitioning to full comfort measures  Lactic acidosis Pt is critically ill and transitioned to full comfort measures.   History of esophageal varices From end stage liver disease now on full comfort care  Alcoholic cirrhosis (HCC) Pt is at end stage liver disease and after discussing with GI not a candidate for liver transplant given ongoing alcohol abuse.  She has been transitioned to full comfort measures.    Protein-calorie malnutrition, severe Full comfort measures  Paroxysmal atrial fibrillation (HCC) Full comfort.   History of lung cancer Pt transitioned to full comfort measures.   Type 2 diabetes mellitus (HCC) Full comfort measures  Goals of care, counseling/discussion Appreciate palliative consultation and discussions.  Pt was transitioned to full comfort care on 3/21 and [facility] evaluated patient today and recommended GIP status.   DVT prophylaxis: FULL COMFORT CARE   Code Status: DNR   Family Communication: sister, son updated   Disposition Plan: Residential hospice   Consults called:   Admission status: GIP  Level of care:  Standley Dakins  MD [facility] How to contact the [facility] Attending or Consulting provider 7A - 7P or covering provider during after hours 7P -7A, for this patient?  Check the care team in [facility] and look for a) attending/consulting [facility] provider listed and b) the [facility] team listed Log into [email or web address] and use [place]'s universal password to access. If you do not have the password, please contact the hospital operator. Locate the [facility] provider you are looking for under [facility] and page to a number that you can be directly reached. If you still have difficulty reaching the provider, please page the [facility] (Director on Call) for the Hospitalists listed on amion for assistance.   If 7PM-7AM, please contact night-coverage [email or web address] Password [facility]  11/09/2021, 4:32 PM

## 2021-11-09 NOTE — Assessment & Plan Note (Addendum)
Appreciate palliative consultation and discussions.  Pt was transitioned to full comfort care on 3/21 and [facility] evaluated patient today and recommended GIP status.  ?Patient transitioned to GIP status waiting for residential hospice bed ?Life expectancy < 2 weeks ?

## 2021-11-09 NOTE — Assessment & Plan Note (Addendum)
Patient is status post right lumpectomy. ?She had a recent CTA chest done at [facility] showing increased consolidation to the right apex with follow-up planned with her oncologist [name].?? ?Pt now transitioned to full comfort measures.  ?

## 2021-11-09 NOTE — Assessment & Plan Note (Addendum)
Controlled ?Full comfort measures now ?

## 2021-11-10 DIAGNOSIS — B9689 Other specified bacterial agents as the cause of diseases classified elsewhere: Secondary | ICD-10-CM

## 2021-11-10 DIAGNOSIS — K7030 Alcoholic cirrhosis of liver without ascites: Secondary | ICD-10-CM | POA: Diagnosis not present

## 2021-11-10 DIAGNOSIS — Z7189 Other specified counseling: Secondary | ICD-10-CM

## 2021-11-10 DIAGNOSIS — K7682 Hepatic encephalopathy: Secondary | ICD-10-CM | POA: Diagnosis not present

## 2021-11-10 DIAGNOSIS — I480 Paroxysmal atrial fibrillation: Secondary | ICD-10-CM

## 2021-11-10 DIAGNOSIS — B9561 Methicillin susceptible Staphylococcus aureus infection as the cause of diseases classified elsewhere: Secondary | ICD-10-CM

## 2021-11-10 DIAGNOSIS — R7881 Bacteremia: Secondary | ICD-10-CM

## 2021-11-10 DIAGNOSIS — Z515 Encounter for palliative care: Secondary | ICD-10-CM | POA: Diagnosis not present

## 2021-11-10 DIAGNOSIS — N390 Urinary tract infection, site not specified: Secondary | ICD-10-CM

## 2021-11-10 DIAGNOSIS — E43 Unspecified severe protein-calorie malnutrition: Secondary | ICD-10-CM

## 2021-11-10 LAB — URINE CULTURE: Culture: >=100000 — AB

## 2021-11-10 MED ORDER — ONDANSETRON HCL 4 MG/2ML IJ SOLN: 4.0000 mg | Freq: Four times a day (QID) | INTRAMUSCULAR | Status: DC | PRN

## 2021-11-10 MED ORDER — HALOPERIDOL LACTATE 2 MG/ML PO CONC
0.5000 mg | ORAL | Status: DC | PRN
  Filled 2021-11-10: qty 0.3

## 2021-11-10 MED ORDER — GLYCOPYRROLATE 0.2 MG/ML IJ SOLN
0.2000 mg | INTRAMUSCULAR | Status: DC | PRN
Start: 2021-11-10 — End: 2021-11-15

## 2021-11-10 MED ORDER — GLYCOPYRROLATE 1 MG PO TABS: 1.0000 mg | ORAL_TABLET | ORAL | Status: DC | PRN

## 2021-11-10 MED ORDER — ONDANSETRON 4 MG PO TBDP
4.0000 mg | ORAL_TABLET | Freq: Four times a day (QID) | ORAL | Status: DC | PRN
Start: 2021-11-10 — End: 2021-11-15

## 2021-11-10 MED ORDER — BIOTENE DRY MOUTH MT LIQD: 15.0000 mL | OROMUCOSAL | Status: DC | PRN

## 2021-11-10 MED ORDER — HALOPERIDOL LACTATE 5 MG/ML IJ SOLN
0.5000 mg | INTRAMUSCULAR | Status: DC | PRN
  Administered 2021-11-12: 0.5 mg via INTRAVENOUS
  Filled 2021-11-10: qty 1

## 2021-11-10 MED ORDER — HALOPERIDOL 0.5 MG PO TABS: 0.5000 mg | ORAL_TABLET | ORAL | Status: DC | PRN

## 2021-11-10 MED ORDER — POLYVINYL ALCOHOL 1.4 % OP SOLN: 1.0000 [drp] | Freq: Four times a day (QID) | OPHTHALMIC | Status: DC | PRN

## 2021-11-10 MED ORDER — ACETAMINOPHEN 650 MG RE SUPP: 650.0000 mg | Freq: Four times a day (QID) | RECTAL | Status: DC | PRN

## 2021-11-10 MED ORDER — ACETAMINOPHEN 325 MG PO TABS: 650.0000 mg | ORAL_TABLET | Freq: Four times a day (QID) | ORAL | Status: DC | PRN

## 2021-11-10 NOTE — Progress Notes (Addendum)
Palliative: ?Ms. Loschiavo is lying quietly in bed.  She will briefly make but not keep eye contact.  She appears acutely/chronically ill and very frail.  Her family has elected comfort care.  Her sister, [name] and her cousin are at bedside. ? ?Lattie Haw and I talk outside of the room about prognosis.  Anticipate less than 2 weeks.  We talked about symptom management needs. ?[place] form/DNR completed and placed on chart. ? ?Conference with nursing staff related to patient condition, needs, goals of care, disposition. ? ?Plan: Comfort and dignity at end-of-life, residential hospice at [facility] when bed becomes available. ? ?No charge ?Quinn Axe, NP ?Palliative medicine team ?Team phone [phone] ?Greater than 50% of this time was spent counseling and coordinating care related to the above assessment and plan. ?

## 2021-11-10 NOTE — Progress Notes (Signed)
Stated name and that she was in the hospital .  Unable to state birthday and said the year was [birth year] something. Follows command to grip.   Denies any pain or discomfort.  ?

## 2021-11-10 NOTE — Assessment & Plan Note (Signed)
Presented with altered mental status with ammonia 214 ?Initially on lactulose ?Transitioned to full comfort measures ?

## 2021-11-10 NOTE — Progress Notes (Signed)
?  ?       ?PROGRESS NOTE ? ?Sandy [name] JGG:[id number] DOB: 1956/06/20 DOA: 10/26/2021 ?PCP: Sandy Downing, MD ? ?Brief History:  ?66 year old female with longstanding history of alcohol abuse, alcoholic liver cirrhosis with esophageal varices, GI bleeding, lung cancer s/p lobectomy, hypertension, hyperlipidemia, type 2 diabetes mellitus, hypothyroidism, COPD, atrial fibrillation.  Patient has had 2 recent hospitalizations in the last month.  The patient was admitted from 10/29/2021 to 11/01/2021 with atrial fibrillation with RVR.  She was started on diltiazem and nadolol at the time of discharge.  She was also discharged home with a steroid taper secondary to her pleuritic chest pain at that time CTA of the chest was negative for PE during that admission.  Once again, she was readmitted from 11/03/2021 to 11/06/2021 for hepatic encephalopathy.  At that time, she was discharged to a skilled nursing facility.  With lactulose.   She was last noted to be normal on 11/07/2021 according to the nursing home staff.  They went in to check on her this morning and found her to be obtunded and sent her to the emergency department.  When she was in the hospital she was receiving lactulose twice daily with 3-4 bowel movements per day.  Her ammonia levels had improved and her mentation had normalized to baseline.  At the time of this admission on 11/08/2021, the patient was noted to have an ammonia level of 214. ?  ?Patient recently had an admission in February for upper GI bleeding with hematemesis.  She had an EGD on 09/24/2021 showing LA grade D esophagitis with no bleeding.  She has portal hypertensive gastropathy, few gastric polyps and a single nonbleeding angioectasia in the duodenum.  She was treated with APC and was placed on PPI for 8 weeks. ?  ?Her ED work-up significant for CT scan brain with no acute findings.  ABG revealed a normal PCO2.  Ammonia level elevated at 214.  Her total bilirubin was 4.4.  Her lactic  acid was 3.5.  Her platelets were 178.  She recently tested positive for THC.  Her CXR reveals a moderate right pleural effusion mostly unchanged from 3/15.   ?  ?11/17/2021: full comfort measures. [facility] to come assess for residential hospice bed, if not available will make GIP status. Family to meet with [facility] hospice at bedside.  Transition to GIP per hosp[facility] of [facility]. ? ?11/10/21: Patient remains in GIP status waiting for residential hospice bed.  Full comfort measures continues.  Patient is wake and occasionally answers yes/no questions.  No pain or sob.  ? ? ? ?Assessment and Plan: ?* Comfort measures only status ?Pt admitted to [facility] for full comfort measures.  ? ?Acute hepatic encephalopathy ?Presented with altered mental status with ammonia 214 ?Initially on lactulose ?Transitioned to full comfort measures ? ?MSSA bacteremia ?From end stage liver disease, now transitioning to full comfort measures ?3/20 blood cultures MSSA ?Antibiotics not started as pt was transitioned to full comfort care ? ?UTI due to Klebsiella species ?UA 11-20 WBC ?Antibiotics not started as patient was transitioned to full comfort measures ? ?Alcoholic cirrhosis ([place]) ?Pt is at end stage liver disease and after discussing with GI not a candidate for liver transplant given ongoing alcohol abuse.  She has been transitioned to full comfort measures.   ? ?Lactic acidosis ?Pt is critically ill and transitioned to full comfort measures.  ?Initially had lactate 3.5 ? ?Protein-calorie malnutrition, severe ?Full comfort measures ? ?Hypothyroidism ?Now on  full comfort measures ? ?Paroxysmal atrial fibrillation (HCC) ?Previously on diltiazem and nadolol ?No longer active issue as pt transitioned to full comfort measures ? ?History of lung cancer ?Patient is status post right lumpectomy. ?She had a recent CTA chest done at [facility] showing increased consolidation to the right apex with follow-up planned with her oncologist Dr. Earlie [name].   ?Pt now  transitioned to full comfort measures.  ? ?Goals of care, counseling/discussion ?Appreciate palliative consultation and discussions.  Pt was transitioned to full comfort care on 3/21 and [facility] evaluated patient today and recommended GIP status.  ?Patient transitioned to GIP status waiting for residential hospice bed ?Life expectancy < 2 weeks ? ?History of esophageal varices ?From end stage liver disease now on full comfort care ? ?Type 2 diabetes mellitus ([place]) ?Controlled ?Full comfort measures now ? ? ? ? ? ? ?Family Communication:   no Family at bedside ? ?Consultants:  palliative medicine ? ?Code Status:  FULL  COMFORT ? ?DVT Prophylaxis:  FULL COMFORT ? ? ?Procedures: ?As Listed in Progress Note Above ? ?Antibiotics: ? ? ? ? ?Subjective: ? ?Patient is awake.  Occasionally answers yes/no questions.  Denies pain or sob or vomiting. ?Objective: ?There were no vitals filed for this visit. ? ?Intake/Output Summary (Last 24 hours) at 11/10/2021 1651 ?Last data filed at 11/10/2021 0602 ?Gross per 24 hour  ?Intake 0 ml  ?Output 500 ml  ?Net -500 ml  ? ?Weight change:  ?Exam: ? ?General:  Pt is alert, not in acute distress ?HEENT: No icterus, No thrush, [place]/AT ?Cardiovascular: RRR, S1/S2, no rubs, no gallops ?Respiratory: bibasilar rales ?Abdomen: Soft/+BS, non tender, non distended, no guarding ?Extremities: No edema, No lymphangitis, No petechiae, No rashes, no synovitis ? ? ?Data Reviewed: ?I have personally reviewed following labs and imaging studies ?Basic Metabolic Panel: ?Recent Labs  ?Lab 11/04/21 ?9163 11/05/21 ?0800 11/08/21 ?1133 11/08/2021 ?0636  ?NA 135 135 134* 140  ?K 3.0* 3.6 4.0 3.5  ?CL 101 105 103 111  ?CO2 25 23 21* 20*  ?GLUCOSE 171* 144* 143* 147*  ?BUN 18 24* 34* 32*  ?CREATININE 0.57 0.71 0.69 0.62  ?CALCIUM 7.8* 7.8* 8.0* 8.3*  ?MG 1.8 2.4 2.0 1.8  ? ?Liver Function Tests: ?Recent Labs  ?Lab 11/04/21 ?8466 11/08/21 ?1133 11/14/2021 ?0636  ?AST 92* 44* 36  ?ALT 75* 49* 43  ?ALKPHOS 120 122 121   ?BILITOT 3.8* 4.4* 5.0*  ?PROT 5.8* 5.9* 5.7*  ?ALBUMIN 2.3* 2.2* 2.2*  ? ?Recent Labs  ?Lab 11/08/21 ?1133  ?LIPASE 47  ? ?Recent Labs  ?Lab 11/04/21 ?5993 11/05/21 ?5701 11/08/21 ?1133  ?AMMONIA 42* 53* 214*  ? ?Coagulation Profile: ?Recent Labs  ?Lab 11/08/21 ?1413 10/31/2021 ?0636  ?INR 1.6* 1.6*  ? ?CBC: ?Recent Labs  ?Lab 11/04/21 ?7793 11/08/21 ?1133 10/23/2021 ?0636  ?WBC 4.0 10.4 8.5  ?NEUTROABS  --  8.7*  --   ?HGB 11.5* 12.8 11.8*  ?HCT 37.1 38.2 38.1  ?MCV 105.7* 103.8* 105.5*  ?PLT 160 178 143*  ? ?Cardiac Enzymes: ?No results for input(s): CKTOTAL, CKMB, CKMBINDEX, TROPONINI in the last 168 hours. ?BNP: ?Invalid input(s): POCBNP ?CBG: ?Recent Labs  ?Lab 11/06/21 ?1123 11/08/21 ?1720 11/08/21 ?2043 10/31/2021 ?0730 11/08/2021 ?1116  ?GLUCAP 136* 143* 143* 138* 131*  ? ?HbA1C: ?No results for input(s): HGBA1C in the last 72 hours. ?Urine analysis: ?   ?Component Value Date/Time  ? COLORURINE YELLOW 11/08/2021 1156  ? APPEARANCEUR CLEAR 11/08/2021 1156  ? LABSPEC 1.018 11/08/2021  [place] 6.0 11/08/2021 1156  ? GLUCOSEU NEGATIVE 11/08/2021 1156  ? HGBUR NEGATIVE 11/08/2021 1156  ? [place] NEGATIVE 11/08/2021 1156  ? [place] NEGATIVE 11/08/2021 1156  ? PROTEINUR NEGATIVE 11/08/2021 1156  ? NITRITE NEGATIVE 11/08/2021 1156  ? LEUKOCYTESUR MODERATE (A) 11/08/2021 1156  ? ?Sepsis Labs: ?@[email or web address] (procalcitonin:4,lacticidven:4) ?) ?Recent Results (from the past 240 hour(s))  ?Resp Panel by RT-PCR (Flu A&B, Covid)     Status: None  ? Collection Time: 11/03/21 12:05 PM  ? Specimen: Nasopharyngeal(NP) swabs in vial transport medium  ?Result Value Ref Range Status  ? SARS Coronavirus 2 by RT PCR NEGATIVE NEGATIVE Final  ?  Comment: (NOTE) ?SARS-CoV-2 target nucleic acids are NOT DETECTED. ? ?The SARS-CoV-2 RNA is generally detectable in upper respiratory ?specimens during the acute phase of infection. The lowest ?concentration of SARS-CoV-2 viral copies this assay can detect is ?138 copies/mL. A negative  result does not preclude SARS-Cov-2 ?infection and should not be used as the sole basis for treatment or ?other patient management decisions. A negative result may occur with  ?improper specimen collection

## 2021-11-10 NOTE — Assessment & Plan Note (Signed)
Now on full comfort measures ?

## 2021-11-10 NOTE — Assessment & Plan Note (Signed)
UA 11-20 WBC ?Antibiotics not started as patient was transitioned to full comfort measures ?

## 2021-11-11 DIAGNOSIS — Z7189 Other specified counseling: Secondary | ICD-10-CM | POA: Diagnosis not present

## 2021-11-11 DIAGNOSIS — K7682 Hepatic encephalopathy: Secondary | ICD-10-CM | POA: Diagnosis not present

## 2021-11-11 DIAGNOSIS — K7030 Alcoholic cirrhosis of liver without ascites: Secondary | ICD-10-CM | POA: Diagnosis not present

## 2021-11-11 DIAGNOSIS — Z515 Encounter for palliative care: Secondary | ICD-10-CM | POA: Diagnosis not present

## 2021-11-11 LAB — CULTURE, BLOOD (ROUTINE X 2): Special Requests: ADEQUATE

## 2021-11-11 NOTE — TOC Progression Note (Signed)
Transition of Care (TOC) - Progression Note  ? ? ?Patient Details  ?Name: Sandy [name] ?MRN: [id number] ?Date of Birth: 09-21-1955 ? ?Transition of Care (TOC) CM/SW Contact  ?Salome Arnt, LCSW ?Phone Number: ?11/11/2021, 10:19 AM ? ?Clinical Narrative:  [facility], no residential beds available. MD updated. TOC will continue to follow.   ? ? ? ?  ?  ? ?Expected Discharge Plan and Services ?  ?  ?  ?  ?  ?                ?  ?  ?  ?  ?  ?  ?  ?  ?  ?  ? ? ?Social Determinants of Health (SDOH) Interventions ?  ? ?Readmission Risk Interventions ? ?  11/06/2021  ? 11:25 AM 11/04/2021  ?  4:32 PM  ?Readmission Risk Prevention Plan  ?Transportation Screening Complete Complete  ?PCP or Specialist Appt within 3-5 Days Complete   ?[place] or Home Care Consult Complete Complete  ?Social Work Consult for [place] Planning/Counseling Complete Complete  ?Palliative Care Screening Complete Not Applicable  ?Medication Review Press photographer) Complete Complete  ? ? ?

## 2021-11-11 NOTE — Progress Notes (Signed)
?  ?       ?PROGRESS NOTE ? ?Sandy [name] CHE:[id number] DOB: 11/10/1955 DOA: 10/30/2021 ?PCP: [name], MD ? ?Brief History:  ?66 year old female with longstanding history of alcohol abuse, alcoholic liver cirrhosis with esophageal varices, GI bleeding, lung cancer s/p lobectomy, hypertension, hyperlipidemia, type 2 diabetes mellitus, hypothyroidism, COPD, atrial fibrillation.  Patient has had 2 recent hospitalizations in the last month.  The patient was admitted from 10/29/2021 to 11/01/2021 with atrial fibrillation with RVR.  She was started on diltiazem and nadolol at the time of discharge.  She was also discharged home with a steroid taper secondary to her pleuritic chest pain at that time CTA of the chest was negative for PE during that admission.  Once again, she was readmitted from 11/03/2021 to 11/06/2021 for hepatic encephalopathy.  At that time, she was discharged to a skilled nursing facility.  With lactulose.   She was last noted to be normal on 11/07/2021 according to the nursing home staff.  They went in to check on her this morning and found her to be obtunded and sent her to the emergency department.  When she was in the hospital she was receiving lactulose twice daily with 3-4 bowel movements per day.  Her ammonia levels had improved and her mentation had normalized to baseline.  At the time of this admission on 11/08/2021, the patient was noted to have an ammonia level of 214. ?  ?Patient recently had an admission in February for upper GI bleeding with hematemesis.  She had an EGD on 09/24/2021 showing LA grade D esophagitis with no bleeding.  She has portal hypertensive gastropathy, few gastric polyps and a single nonbleeding angioectasia in the duodenum.  She was treated with APC and was placed on PPI for 8 weeks. ?  ?Her ED work-up significant for CT scan brain with no acute findings.  ABG revealed a normal PCO2.  Ammonia level elevated at 214.  Her total bilirubin was 4.4.  Her lactic  acid was 3.5.  Her platelets were 178.  She recently tested positive for THC.  Her CXR reveals a moderate right pleural effusion mostly unchanged from 3/15.   ?  ?11/03/2021: full comfort measures. [facility] to come assess for residential hospice bed, if not available will make GIP status. Family to meet with [facility] hosp[facility] at bedside.  Transition to GIP per [facility] of [facility]. ? ?11/10/21: Patient remains in GIP status waiting for residential hospice bed.  Full comfort measures continues.  Patient is wake and occasionally answers yes/no questions.  No pain or sob. ? ?11/11/21:  Patient remains in GIP status.  Full comfort measures continues.  Patient is awake and occasionally answers yes/no but appears more somnolent today.  No pain or sob endorsed.  ? ? ?Assessment and Plan: ?* Comfort measures only status ?Pt admitted to [facility] for full comfort measures.  ?11/11/21:  Remains stable for transfer to residential hospice;  Expected life expectancy < 2 weeks ? ?Acute hepatic encephalopathy ?Presented with altered mental status with ammonia 214 ?Initially on lactulose ?Transitioned to full comfort measures ? ?MSSA bacteremia ?From end stage liver disease, now transitioning to full comfort measures ?3/20 blood cultures MSSA ?Antibiotics not started as pt was transitioned to full comfort care ? ?UTI due to Klebsiella species ?UA 11-20 WBC ?Antibiotics not started as patient was transitioned to full comfort measures ? ?Alcoholic cirrhosis ([place]) ?Pt is at end stage liver disease and after discussing with GI not a candidate  for liver transplant given ongoing alcohol abuse.  She has been transitioned to full comfort measures.   ? ?Lactic acidosis ?Pt is critically ill and transitioned to full comfort measures.  ?Initially had lactate 3.5 ? ?Protein-calorie malnutrition, severe ?Full comfort measures ? ?Hypothyroidism ?Now on full comfort measures ? ?Paroxysmal atrial fibrillation (HCC) ?Previously on diltiazem and nadolol ?No longer active issue  as pt transitioned to full comfort measures ? ?History of lung cancer ?Patient is status post right lumpectomy. ?She had a recent CTA chest done at [facility] showing increased consolidation to the right apex with follow-up planned with her oncologist [name].   ?Pt now transitioned to full comfort measures.  ? ?Goals of care, counseling/discussion ?Appreciate palliative consultation and discussions.  Pt was transitioned to full comfort care on 3/21 and [facility] evaluated patient today and recommended GIP status.  ?Patient transitioned to GIP status waiting for residential hospice bed ?Life expectancy < 2 weeks ? ?History of esophageal varices ?From end stage liver disease now on full comfort care ? ?Type 2 diabetes mellitus ([place]) ?Controlled ?Full comfort measures now ? ? ? ? ? ? ? ? ? ?Family Communication:   no Family at bedside ? ?Consultants: none  ? ?Code Status:  FULL COMFORT ? ?DVT Prophylaxis:  FULL COMFORT ? ? ?Procedures: ?As Listed in Progress Note Above ? ?Antibiotics: ?None ? ? ? ? ? ? ?Subjective: ?Patient is awake, intermittently answers yes/no.  No pain or sob. ? ?Objective: ?There were no vitals filed for this visit. ? ?Intake/Output Summary (Last 24 hours) at 11/11/2021 1719 ?Last data filed at 11/10/2021 2300 ?Gross per 24 hour  ?Intake --  ?Output 550 ml  ?Net -550 ml  ? ?Weight change:  ?Exam: ? ?General:  Pt is alert, not in acute distress ?HEENT: + icterus, No thrush, No neck mass, [place]/AT ?Cardiovascular: RRR, S1/S2, no rubs, no gallops ?Respiratory: bibasilar rales.  Poor inspiratory effort ?Abdomen: Soft/+BS, non tender, non distended, no guarding ?Extremities: No edema, No lymphangitis, No petechiae, No rashes, no synovitis ? ? ?Data Reviewed: ?I have personally reviewed following labs and imaging studies ?Basic Metabolic Panel: ?Recent Labs  ?Lab 11/05/21 ?0800 11/08/21 ?1133 11/13/2021 ?0636  ?NA 135 134* 140  ?K 3.6 4.0 3.5  ?CL 105 103 111  ?CO2 23 21* 20*  ?GLUCOSE 144*  143* 147*  ?BUN 24* 34* 32*  ?CREATININE 0.71 0.69 0.62  ?CALCIUM 7.8* 8.0* 8.3*  ?MG 2.4 2.0 1.8  ? ?Liver Function Tests: ?Recent Labs  ?Lab 11/08/21 ?1133 10/28/2021 ?0636  ?AST 44* 36  ?ALT 49* 43  ?ALKPHOS 122 121  ?BILITOT 4.4* 5.0*  ?PROT 5.9* 5.7*  ?ALBUMIN 2.2* 2.2*  ? ?Recent Labs  ?Lab 11/08/21 ?1133  ?LIPASE 47  ? ?Recent Labs  ?Lab 11/05/21 ?6659 11/08/21 ?1133  ?AMMONIA 53* 214*  ? ?Coagulation Profile: ?Recent Labs  ?Lab 11/08/21 ?1413 11/02/2021 ?0636  ?INR 1.6* 1.6*  ? ?CBC: ?Recent Labs  ?Lab 11/08/21 ?1133 11/03/2021 ?0636  ?WBC 10.4 8.5  ?NEUTROABS 8.7*  --   ?HGB 12.8 11.8*  ?HCT 38.2 38.1  ?MCV 103.8* 105.5*  ?PLT 178 143*  ? ?Cardiac Enzymes: ?No results for input(s): CKTOTAL, CKMB, CKMBINDEX, TROPONINI in the last 168 hours. ?BNP: ?Invalid input(s): POCBNP ?CBG: ?Recent Labs  ?Lab 11/06/21 ?1123 11/08/21 ?1720 11/08/21 ?2043 10/21/2021 ?0730 10/23/2021 ?1116  ?GLUCAP 136* 143* 143* 138* 131*  ? ?HbA1C: ?No results for input(s): HGBA1C in the last 72 hours. ?Urine analysis: ?   ?  Component Value Date/Time  ? COLORURINE YELLOW 11/08/2021 1156  ? APPEARANCEUR CLEAR 11/08/2021 1156  ? LABSPEC 1.018 11/08/2021 1156  ? PHURINE 6.0 11/08/2021 1156  ? GLUCOSEU NEGATIVE 11/08/2021 1156  ? HGBUR NEGATIVE 11/08/2021 1156  ? [place] NEGATIVE 11/08/2021 1156  ? [place] NEGATIVE 11/08/2021 1156  ? PROTEINUR NEGATIVE 11/08/2021 1156  ? NITRITE NEGATIVE 11/08/2021 1156  ? LEUKOCYTESUR MODERATE (A) 11/08/2021 1156  ? ?Sepsis Labs: ?@[email or web address] (procalcitonin:4,lacticidven:4) ?) ?Recent Results (from the past 240 hour(s))  ?Resp Panel by RT-PCR (Flu A&B, Covid)     Status: None  ? Collection Time: 11/03/21 12:05 PM  ? Specimen: Nasopharyngeal(NP) swabs in vial transport medium  ?Result Value Ref Range Status  ? SARS Coronavirus 2 by RT PCR NEGATIVE NEGATIVE Final  ?  Comment: (NOTE) ?SARS-CoV-2 target nucleic acids are NOT DETECTED. ? ?The SARS-CoV-2 RNA is generally detectable in upper respiratory ?specimens during  the acute phase of infection. The lowest ?concentration of SARS-CoV-2 viral copies this assay can detect is ?138 copies/mL. A negative result does not preclude SARS-Cov-2 ?infection and should not be use

## 2021-11-12 NOTE — Progress Notes (Signed)
?  ?       ?PROGRESS NOTE ? ?Sandy [name] UEA:[id number] DOB: 01-17-1956 DOA: 10/28/2021 ?PCP: [name], MD ? ?Brief History:  ?66 year old female with longstanding history of alcohol abuse, alcoholic liver cirrhosis with esophageal varices, GI bleeding, lung cancer s/p lobectomy, hypertension, hyperlipidemia, type 2 diabetes mellitus, hypothyroidism, COPD, atrial fibrillation.  Patient has had 2 recent hospitalizations in the last month.  The patient was admitted from 10/29/2021 to 11/01/2021 with atrial fibrillation with RVR.  She was started on diltiazem and nadolol at the time of discharge.  She was also discharged home with a steroid taper secondary to her pleuritic chest pain at that time CTA of the chest was negative for PE during that admission.  Once again, she was readmitted from 11/03/2021 to 11/06/2021 for hepatic encephalopathy.  At that time, she was discharged to a skilled nursing facility.  With lactulose.   She was last noted to be normal on 11/07/2021 according to the nursing home staff.  They went in to check on her this morning and found her to be obtunded and sent her to the emergency department.  When she was in the hospital she was receiving lactulose twice daily with 3-4 bowel movements per day.  Her ammonia levels had improved and her mentation had normalized to baseline.  At the time of this admission on 11/08/2021, the patient was noted to have an ammonia level of 214. ?  ?Patient recently had an admission in February for upper GI bleeding with hematemesis.  She had an EGD on 09/24/2021 showing LA grade D esophagitis with no bleeding.  She has portal hypertensive gastropathy, few gastric polyps and a single nonbleeding angioectasia in the duodenum.  She was treated with APC and was placed on PPI for 8 weeks. ?  ?Her ED work-up significant for CT scan brain with no acute findings.  ABG revealed a normal PCO2.  Ammonia level elevated at 214.  Her total bilirubin was 4.4.  Her lactic  acid was 3.5.  Her platelets were 178.  She recently tested positive for THC.  Her CXR reveals a moderate right pleural effusion mostly unchanged from 3/15.   ?  ?11/08/2021: full comfort measures. [facility] to come assess for residential hospice bed, if not available will make GIP status. Family to meet with [facility] hospice at bedside.  Transition to GIP per [facility]. ? ?11/10/21: Patient remains in GIP status waiting for residential hospice bed.  Full comfort measures continues.  Patient is wake and occasionally answers yes/no questions.  No pain or sob. ? ?11/11/21:  Patient remains in GIP status.  Full comfort measures continues.  Patient is awake and occasionally answers yes/no but appears more somnolent today.  No pain or sob endorsed. ? ?11/12/32:  Patient remains in GIP.  No pain. Awake but not answering as much today.  ? ? ?Assessment and Plan: ?* Comfort measures only status ?Pt admitted to [facility] for full comfort measures.  ?11/11/21:  Remains stable for transfer to residential hospice;  Expected life expectancy < 2 weeks ? ?Acute hepatic encephalopathy ?Presented with altered mental status with ammonia 214 ?Initially on lactulose ?Transitioned to full comfort measures ? ?MSSA bacteremia ?From end stage liver disease, now transitioning to full comfort measures ?3/20 blood cultures MSSA ?Antibiotics not started as pt was transitioned to full comfort care ? ?UTI due to Klebsiella species ?UA 11-20 WBC ?Antibiotics not started as patient was transitioned to full comfort measures ? ?Alcoholic  cirrhosis ([place]) ?Pt is at end stage liver disease and after discussing with GI not a candidate for liver transplant given ongoing alcohol abuse.  She has been transitioned to full comfort measures.   ? ?Lactic acidosis ?Pt is critically ill and transitioned to full comfort measures.  ?Initially had lactate 3.5 ? ?Protein-calorie malnutrition, severe ?Full comfort measures ? ?Hypothyroidism ?Now on full comfort measures ? ?Paroxysmal  atrial fibrillation (HCC) ?Previously on diltiazem and nadolol ?No longer active issue as pt transitioned to full comfort measures ? ?History of lung cancer ?Patient is status post right lumpectomy. ?She had a recent CTA chest done at [facility] showing increased consolidation to the right apex with follow-up planned with her oncologist [name].   ?Pt now transitioned to full comfort measures.  ? ?Goals of care, counseling/discussion ?Appreciate palliative consultation and discussions.  Pt was transitioned to full comfort care on 3/21 and [facility] patient today and recommended GIP status.  ?Patient transitioned to GIP status waiting for residential hospice bed ?Life expectancy < 2 weeks ? ?History of esophageal varices ?From end stage liver disease now on full comfort care ? ?Type 2 diabetes mellitus ([place]) ?Controlled ?Full comfort measures now ? ? ? ? ? ? ?  ?Family Communication:   no Family at bedside ?  ?Consultants: none  ?  ?Code Status:  FULL COMFORT ?  ?DVT Prophylaxis:  FULL COMFORT ?  ?  ?Procedures: ?As Listed in Progress Note Above ?  ?Antibiotics: ?None ?  ?  ?  ? ? ? ? ? ?Subjective: ?Opens eyes.  Only intermittently respond.  No distress. ? ?Objective: ?Vitals:  ? 11/12/21 1502  ?BP: (!) 125/49  ?Pulse: (!) 104  ?Resp: 18  ?Temp: 98 ?F (36.7 ?C)  ?TempSrc: Oral  ?SpO2: 98%  ? ? ?Intake/Output Summary (Last 24 hours) at 11/12/2021 1836 ?Last data filed at 11/12/2021 0500 ?Gross per 24 hour  ?Intake --  ?Output 400 ml  ?Net -400 ml  ? ?Weight change:  ?Exam: ? ?General:  Pt is alert,  does not follow commands appropriately, not in acute distress ?HEENT: +icterus, [place]/AT ?Cardiovascular: RRR, S1/S2,  ?Respiratory: bibasilar rales.  Poor inspiratory effort ?Abdomen: Soft/+BS, non tender, non distended, no guarding ?Extremities: No edema, No lymphangitis, No petechiae, No rashes, no synovitis ? ? ?Data Reviewed: ?I have personally reviewed following labs and imaging studies ?Basic  Metabolic Panel: ?Recent Labs  ?Lab 11/08/21 ?1133 10/23/2021 ?0636  ?NA 134* 140  ?K 4.0 3.5  ?CL 103 111  ?CO2 21* 20*  ?GLUCOSE 143* 147*  ?BUN 34* 32*  ?CREATININE 0.69 0.62  ?CALCIUM 8.0* 8.3*  ?MG 2.0 1.8  ? ?Liver Function Tests: ?Recent Labs  ?Lab 11/08/21 ?1133 10/23/2021 ?0636  ?AST 44* 36  ?ALT 49* 43  ?ALKPHOS 122 121  ?BILITOT 4.4* 5.0*  ?PROT 5.9* 5.7*  ?ALBUMIN 2.2* 2.2*  ? ?Recent Labs  ?Lab 11/08/21 ?1133  ?LIPASE 47  ? ?Recent Labs  ?Lab 11/08/21 ?1133  ?AMMONIA 214*  ? ?Coagulation Profile: ?Recent Labs  ?Lab 11/08/21 ?1413 11/13/2021 ?0636  ?INR 1.6* 1.6*  ? ?CBC: ?Recent Labs  ?Lab 11/08/21 ?1133 11/12/2021 ?0636  ?WBC 10.4 8.5  ?NEUTROABS 8.7*  --   ?HGB 12.8 11.8*  ?HCT 38.2 38.1  ?MCV 103.8* 105.5*  ?PLT 178 143*  ? ?Cardiac Enzymes: ?No results for input(s): CKTOTAL, CKMB, CKMBINDEX, TROPONINI in the last 168 hours. ?BNP: ?Invalid input(s): POCBNP ?CBG: ?Recent Labs  ?Lab 11/06/21 ?1123 11/08/21 ?1720  11/08/21 ?2043 11/16/2021 ?0730 10/23/2021 ?1116  ?GLUCAP 136* 143* 143* 138* 131*  ? ?HbA1C: ?No results for input(s): HGBA1C in the last 72 hours. ?Urine analysis: ?   ?Component Value Date/Time  ? COLORURINE YELLOW 11/08/2021 1156  ? APPEARANCEUR CLEAR 11/08/2021 1156  ? LABSPEC 1.018 11/08/2021 1156  ? PHURINE 6.0 11/08/2021 1156  ? GLUCOSEU NEGATIVE 11/08/2021 1156  ? HGBUR NEGATIVE 11/08/2021 1156  ? [place] NEGATIVE 11/08/2021 1156  ? [place] NEGATIVE 11/08/2021 1156  ? PROTEINUR NEGATIVE 11/08/2021 1156  ? NITRITE NEGATIVE 11/08/2021 1156  ? LEUKOCYTESUR MODERATE (A) 11/08/2021 1156  ? ?Sepsis Labs: ?@[email or web address] (procalcitonin:4,lacticidven:4) ?) ?Recent Results (from the past 240 hour(s))  ?Resp Panel by RT-PCR (Flu A&B, Covid)     Status: None  ? Collection Time: 11/03/21 12:05 PM  ? Specimen: Nasopharyngeal(NP) swabs in vial transport medium  ?Result Value Ref Range Status  ? SARS Coronavirus 2 by RT PCR NEGATIVE NEGATIVE Final  ?  Comment: (NOTE) ?SARS-CoV-2 target nucleic acids are NOT  DETECTED. ? ?The SARS-CoV-2 RNA is generally detectable in upper respiratory ?specimens during the acute phase of infection. The lowest ?concentration of SARS-CoV-2 viral copies this assay can detect is ?[place]

## 2021-11-12 NOTE — Plan of Care (Signed)
Pt is comfortable with no s/s of pain or distress.  ?Problem: Respiratory: ?Goal: Verbalizations of increased ease of respirations will increase ?Outcome: Progressing ?  ?Problem: Pain Management: ?Goal: Satisfaction with pain management regimen will improve ?Outcome: Progressing ?  ?

## 2021-11-13 DIAGNOSIS — Z66 Do not resuscitate: Secondary | ICD-10-CM

## 2021-11-13 LAB — CULTURE, BLOOD (ROUTINE X 2)
Culture: NO GROWTH
Special Requests: ADEQUATE

## 2021-11-13 NOTE — Progress Notes (Signed)
Has not opened eyes today when receiving care.  Only response was to close mouth around mouth swab. Resp even, small amount of urine output. ?

## 2021-11-13 NOTE — Progress Notes (Signed)
?  ?       ?PROGRESS NOTE ? ?Sandy [name] FYB:[id number] DOB: [birth year] DOA: 10/23/2021 ?PCP: [name], MD ? ?Brief History:  ?66 year old female with longstanding history of alcohol abuse, alcoholic liver cirrhosis with esophageal varices, GI bleeding, lung cancer s/p lobectomy, hypertension, hyperlipidemia, type 2 diabetes mellitus, hypothyroidism, COPD, atrial fibrillation.  Patient has had 2 recent hospitalizations in the last month.  The patient was admitted from 10/29/2021 to 11/01/2021 with atrial fibrillation with RVR.  She was started on diltiazem and nadolol at the time of discharge.  She was also discharged home with a steroid taper secondary to her pleuritic chest pain at that time CTA of the chest was negative for PE during that admission.  Once again, she was readmitted from 11/03/2021 to 11/06/2021 for hepatic encephalopathy.  At that time, she was discharged to a skilled nursing facility.  With lactulose.   She was last noted to be normal on 11/07/2021 according to the nursing home staff.  They went in to check on her this morning and found her to be obtunded and sent her to the emergency department.  When she was in the hospital she was receiving lactulose twice daily with 3-4 bowel movements per day.  Her ammonia levels had improved and her mentation had normalized to baseline.  At the time of this admission on 11/08/2021, the patient was noted to have an ammonia level of 214. ?  ?Patient recently had an admission in February for upper GI bleeding with hematemesis.  She had an EGD on 09/24/2021 showing LA grade D esophagitis with no bleeding.  She has portal hypertensive gastropathy, few gastric polyps and a single nonbleeding angioectasia in the duodenum.  She was treated with APC and was placed on PPI for 8 weeks. ?  ?Her ED work-up significant for CT scan brain with no acute findings.  ABG revealed a normal PCO2.  Ammonia level elevated at 214.  Her total bilirubin was 4.4.  Her lactic  acid was 3.5.  Her platelets were 178.  She recently tested positive for THC.  Her CXR reveals a moderate right pleural effusion mostly unchanged from 3/15.   ?  ?10/26/2021: full comfort measures. [facility] to come assess for residential hospice bed, if not available will make GIP status. Family to meet with [facility] hospice at bedside.  Transition to GIP per hospice of [facility]. ? ?11/10/21: Patient remains in GIP status waiting for residential hospice bed.  Full comfort measures continues.  Patient is wake and occasionally answers yes/no questions.  No pain or sob. ? ?11/11/21:  Patient remains in GIP status.  Full comfort measures continues.  Patient is awake and occasionally answers yes/no but appears more somnolent today.  No pain or sob endorsed. ? ?11/12/21:  Patient remains in GIP.  No pain. Awake but not answering as much today. ? ?11/13/21: patient remains in GIP.  She is more somnolent today.  Only intermittently opens eyes, no responding to questions.  No respiratory distress or uncontrolled pain.  ? ?Assessment and Plan: ?* Comfort measures only status ?Pt admitted to [facility] for full comfort measures.  ?11/11/21:  Remains stable for transfer to residential hospice;  Expected life expectancy < 2 weeks ? ?Acute hepatic encephalopathy ?Presented with altered mental status with ammonia 214 ?Initially on lactulose ?Transitioned to full comfort measures ? ?MSSA bacteremia ?From end stage liver disease, now transitioning to full comfort measures ?3/20 blood cultures MSSA ?Antibiotics not started as pt was  transitioned to full comfort care ? ?UTI due to Klebsiella species ?UA 11-20 WBC ?Antibiotics not started as patient was transitioned to full comfort measures ? ?Alcoholic cirrhosis ([place]) ?Pt is at end stage liver disease and after discussing with GI not a candidate for liver transplant given ongoing alcohol abuse.  She has been transitioned to full comfort measures.   ? ?Lactic acidosis ?Pt is critically ill and transitioned to full  comfort measures.  ?Initially had lactate 3.5 ? ?Protein-calorie malnutrition, severe ?Full comfort measures ? ?Hypothyroidism ?Now on full comfort measures ? ?Paroxysmal atrial fibrillation (HCC) ?Previously on diltiazem and nadolol ?No longer active issue as pt transitioned to full comfort measures ? ?History of lung cancer ?Patient is status post right lumpectomy. ?She had a recent CTA chest done at [facility] showing increased consolidation to the right apex with follow-up planned with her oncologist [name].   ?Pt now transitioned to full comfort measures.  ? ?Goals of care, counseling/discussion ?Appreciate palliative consultation and discussions.  Pt was transitioned to full comfort care on 3/21 and [facility] evaluated patient today and recommended GIP status.  ?Patient transitioned to GIP status waiting for residential hospice bed ?Life expectancy < 2 weeks ? ?History of esophageal varices ?From end stage liver disease now on full comfort care ? ?Type 2 diabetes mellitus ([place]) ?Controlled ?Full comfort measures now ? ? ?Family Communication:   no Family at bedside ?  ?Consultants: none  ?  ?Code Status:  FULL COMFORT ?  ?DVT Prophylaxis:  FULL COMFORT ?  ?  ?Procedures: ?As Listed in Progress Note Above ?  ?Antibiotics: ?None ?  ? ? ? ? ? ? ? ?Subjective: ?Patient is more somnolent.  Opens eye intermittently.  Not answering questions.  No distress noted. ? ?Objective: ?Vitals:  ? 11/12/21 1502  ?BP: (!) 125/49  ?Pulse: (!) 104  ?Resp: 18  ?Temp: 98 ?F (36.7 ?C)  ?TempSrc: Oral  ?SpO2: 98%  ? ?No intake or output data in the 24 hours ending 11/13/21 1753 ?Weight change:  ?Exam: ? ?General:  Pt opens eyes intermittently, does not follow commands appropriately, not in acute distress ?HEENT:+ icterus, [place]/AT ?Cardiovascular: RRR, S1/S2, ?Respiratory: poor inspiratory effort.  No wheezing.   ?Abdomen: Soft/+BS,  non distended,  ?Extremities: No edema, No lymphangitis ? ? ?Data Reviewed: ?I have  personally reviewed following labs and imaging studies ?Basic Metabolic Panel: ?Recent Labs  ?Lab 11/08/21 ?1133 10/25/2021 ?0636  ?NA 134* 140  ?K 4.0 3.5  ?CL 103 111  ?CO2 21* 20*  ?GLUCOSE 143* 147*  ?BUN 34* 32*  ?CREATININE 0.69 0.62  ?CALCIUM 8.0* 8.3*  ?MG 2.0 1.8  ? ?Liver Function Tests: ?Recent Labs  ?Lab 11/08/21 ?1133 11/19/2021 ?0636  ?AST 44* 36  ?ALT 49* 43  ?ALKPHOS 122 121  ?BILITOT 4.4* 5.0*  ?PROT 5.9* 5.7*  ?ALBUMIN 2.2* 2.2*  ? ?Recent Labs  ?Lab 11/08/21 ?1133  ?LIPASE 47  ? ?Recent Labs  ?Lab 11/08/21 ?1133  ?AMMONIA 214*  ? ?Coagulation Profile: ?Recent Labs  ?Lab 11/08/21 ?1413 10/22/2021 ?0636  ?INR 1.6* 1.6*  ? ?CBC: ?Recent Labs  ?Lab 11/08/21 ?1133 10/29/2021 ?0636  ?WBC 10.4 8.5  ?NEUTROABS 8.7*  --   ?HGB 12.8 11.8*  ?HCT 38.2 38.1  ?MCV 103.8* 105.5*  ?PLT 178 143*  ? ?Cardiac Enzymes: ?No results for input(s): CKTOTAL, CKMB, CKMBINDEX, TROPONINI in the last 168 hours. ?BNP: ?Invalid input(s): POCBNP ?CBG: ?Recent Labs  ?Lab 11/08/21 ?1720 11/08/21 ?2043 10/28/2021 ?  0730 10/24/2021 ?1116  ?GLUCAP 143* 143* 138* 131*  ? ?HbA1C: ?No results for input(s): HGBA1C in the last 72 hours. ?Urine analysis: ?   ?Component Value Date/Time  ? COLORURINE YELLOW 11/08/2021 1156  ? APPEARANCEUR CLEAR 11/08/2021 1156  ? LABSPEC 1.018 11/08/2021 1156  ? PHURINE 6.0 11/08/2021 1156  ? GLUCOSEU NEGATIVE 11/08/2021 1156  ? HGBUR NEGATIVE 11/08/2021 1156  ? [place] NEGATIVE 11/08/2021 1156  ? [place] NEGATIVE 11/08/2021 1156  ? PROTEINUR NEGATIVE 11/08/2021 1156  ? NITRITE NEGATIVE 11/08/2021 1156  ? LEUKOCYTESUR MODERATE (A) 11/08/2021 1156  ? ?Sepsis Labs: ?@[email or web address] (procalcitonin:4,lacticidven:4) ?) ?Recent Results (from the past 240 hour(s))  ?Culture, blood (routine x 2)     Status: None  ? Collection Time: 11/08/21 11:51 AM  ? Specimen: BLOOD  ?Result Value Ref Range Status  ? Specimen Description BLOOD LEFT ANTECUBITAL  Final  ? Special Requests   Final  ?  BOTTLES DRAWN AEROBIC AND ANAEROBIC Blood  Culture adequate volume  ? Culture   Final  ?  NO GROWTH 5 DAYS ?Performed at [facility], [address] [place], [place] [address] ?  ? Report Status 11/13/2021 FINAL  Final  ?Urine Culture     Status: Ab

## 2021-11-14 MED ORDER — CHLORHEXIDINE GLUCONATE 0.12 % MT SOLN
15.0000 mL | Freq: Two times a day (BID) | OROMUCOSAL | Status: DC
  Administered 2021-11-14: 15 mL via OROMUCOSAL
  Filled 2021-11-14: qty 15

## 2021-11-14 MED ORDER — ORAL CARE MOUTH RINSE
15.0000 mL | Freq: Two times a day (BID) | OROMUCOSAL | Status: DC
  Administered 2021-11-14: 15 mL via OROMUCOSAL

## 2021-11-14 NOTE — Progress Notes (Signed)
?  ?       ?PROGRESS NOTE ? ?Sandy [name] EHU:[id number] DOB: 1955/12/03 DOA: 10/25/2021 ?PCP: Sandy Downing, MD ? ?Brief History:  ?66 year old female with longstanding history of alcohol abuse, alcoholic liver cirrhosis with esophageal varices, GI bleeding, lung cancer s/p lobectomy, hypertension, hyperlipidemia, type 2 diabetes mellitus, hypothyroidism, COPD, atrial fibrillation.  Patient has had 2 recent hospitalizations in the last month.  The patient was admitted from 10/29/2021 to 11/01/2021 with atrial fibrillation with RVR.  She was started on diltiazem and nadolol at the time of discharge.  She was also discharged home with a steroid taper secondary to her pleuritic chest pain at that time CTA of the chest was negative for PE during that admission.  Once again, she was readmitted from 11/03/2021 to 11/06/2021 for hepatic encephalopathy.  At that time, she was discharged to a skilled nursing facility.  With lactulose.   She was last noted to be normal on 11/07/2021 according to the nursing home staff.  They went in to check on her this morning and found her to be obtunded and sent her to the emergency department.  When she was in the [name] she was receiving lactulose twice daily with 3-4 bowel movements per day.  Her ammonia levels had improved and her mentation had normalized to baseline.  At the time of this admission on 11/08/2021, the patient was noted to have an ammonia level of 214. ?  ?Patient recently had an admission in February for upper GI bleeding with hematemesis.  She had an EGD on 09/24/2021 showing LA grade D esophagitis with no bleeding.  She has portal hypertensive gastropathy, few gastric polyps and a single nonbleeding angioectasia in the duodenum.  She was treated with APC and was placed on PPI for 8 weeks. ?  ?Her ED work-up significant for CT scan brain with no acute findings.  ABG revealed a normal PCO2.  Ammonia level elevated at 214.  Her total bilirubin was 4.4.  Her lactic  acid was 3.5.  Her platelets were 178.  She recently tested positive for THC.  Her CXR reveals a moderate right pleural effusion mostly unchanged from 3/15.   ?  ?10/23/2021: full comfort measures. [facility] to come assess for residential hospice bed, if not available will make GIP status. [facility] to meet with [name] at bedside.  Transition to GIP per [facility] of [facility]. ? ?11/10/21: Patient remains in GIP status waiting for residential hospice bed.  Full comfort measures continues.  Patient is wake and occasionally answers yes/no questions.  No pain or sob. ? ?11/11/21:  Patient remains in GIP status.  Full comfort measures continues.  Patient is awake and occasionally answers yes/no but appears more somnolent today.  No pain or sob endorsed. ? ?11/12/21:  Patient remains in GIP.  No pain. Awake but not answering as much today. ? ?11/13/21: patient remains in GIP.  She is more somnolent today.  Only intermittently opens eyes, no responding to questions.  No respiratory distress or uncontrolled pain. ? ?11/14/21:  remains in GIP.  More somnolent, not responsive to voice.  Not opening eyes.  No respiratory distress or uncontrolled pain.  ? ?Assessment and Plan: ?* Comfort measures only status ?Pt admitted to [facility] for full comfort measures.  ?11/11/21:  Remains stable for transfer to residential hospice;  Expected life expectancy < 2 weeks ? ?Acute hepatic encephalopathy ?Presented with altered mental status with ammonia 214 ?Initially on lactulose ?Transitioned to full comfort measures ? ?  MSSA bacteremia ?From end stage liver disease, now transitioning to full comfort measures ?3/20 blood cultures MSSA ?Antibiotics not started as pt was transitioned to full comfort care ? ?UTI due to Klebsiella species ?UA 11-20 WBC ?Antibiotics not started as patient was transitioned to full comfort measures ? ?Alcoholic cirrhosis ([place]) ?Pt is at end stage liver disease and after discussing with GI not a candidate for liver transplant given ongoing  alcohol abuse.  She has been transitioned to full comfort measures.   ? ?Lactic acidosis ?Pt is critically ill and transitioned to full comfort measures.  ?Initially had lactate 3.5 ? ?Protein-calorie malnutrition, severe ?Full comfort measures ? ?Hypothyroidism ?Now on full comfort measures ? ?Paroxysmal atrial fibrillation (HCC) ?Previously on diltiazem and nadolol ?No longer active issue as pt transitioned to full comfort measures ? ?History of lung cancer ?Patient is status post right lumpectomy. ?She had a recent CTA chest done at [facility] showing increased consolidation to the right apex with follow-up planned with her oncologist Dr. Earlie [name].   ?Pt now transitioned to full comfort measures.  ? ?Goals of care, counseling/discussion ?Appreciate palliative consultation and discussions.  Pt was transitioned to full comfort care on 3/21 and [facility] evaluated patient today and recommended GIP status.  ?Patient transitioned to GIP status waiting for residential hospice bed ?Life expectancy < 2 weeks ? ?History of esophageal varices ?From end stage liver disease now on full comfort care ? ?Type 2 diabetes mellitus ([name]) ?Controlled ?Full comfort measures now ? ? ?Family Communication:   no Family at bedside ?  ?Consultants: none  ?  ?Code Status:  FULL COMFORT ?  ?DVT Prophylaxis:  FULL COMFORT ?  ?  ?Procedures: ?As Listed in Progress Note Above ?  ?Antibiotics: ?None ?  ?  ?  ? ? ? ? ? ? ? ?Subjective: ?Obtunded, non responsive to verbal or tactile stimuli.  No respiratory distress or uncontrolled pain. ? ?Objective: ?Vitals:  ? 11/12/21 1502 11/14/21 1418  ?BP: (!) 125/49 (!) 106/57  ?Pulse: (!) 104 80  ?Resp: 18 19  ?Temp: 98 ?F (36.7 ?C) 98.4 ?F (36.9 ?C)  ?TempSrc: Oral Oral  ?SpO2: 98% 95%  ? ? ?Intake/Output Summary (Last 24 hours) at 11/14/2021 1803 ?Last data filed at 11/14/2021 1300 ?Gross per 24 hour  ?Intake 0 ml  ?Output --  ?Net 0 ml  ? ?Weight change:  ?Exam: ? ?General:  Pt is not in  acute distress ?HEENT: + icterus, [place]/AT ?Cardiovascular: RRR, ?Respiratory: scattered rales ?Abdomen: Soft/+BS, ?Extremities: No edema, No lymphangitis ? ? ?Data Reviewed: ?I have personally reviewed following labs and imaging studies ?Basic Metabolic Panel: ?Recent Labs  ?Lab 11/08/21 ?1133 11/13/2021 ?0636  ?NA 134* 140  ?K 4.0 3.5  ?CL 103 111  ?CO2 21* 20*  ?GLUCOSE 143* 147*  ?BUN 34* 32*  ?CREATININE 0.69 0.62  ?CALCIUM 8.0* 8.3*  ?MG 2.0 1.8  ? ?Liver Function Tests: ?Recent Labs  ?Lab 11/08/21 ?1133 11/12/2021 ?0636  ?AST 44* 36  ?ALT 49* 43  ?ALKPHOS 122 121  ?BILITOT 4.4* 5.0*  ?PROT 5.9* 5.7*  ?ALBUMIN 2.2* 2.2*  ? ?Recent Labs  ?Lab 11/08/21 ?1133  ?LIPASE 47  ? ?Recent Labs  ?Lab 11/08/21 ?1133  ?AMMONIA 214*  ? ?Coagulation Profile: ?Recent Labs  ?Lab 11/08/21 ?1413 11/13/2021 ?0636  ?INR 1.6* 1.6*  ? ?CBC: ?Recent Labs  ?Lab 11/08/21 ?1133 10/29/2021 ?0636  ?WBC 10.4 8.5  ?NEUTROABS 8.7*  --   ?HGB 12.8 11.8*  ?HCT  38.2 38.1  ?MCV 103.8* 105.5*  ?PLT 178 143*  ? ?Cardiac Enzymes: ?No results for input(s): CKTOTAL, CKMB, CKMBINDEX, TROPONINI in the last 168 hours. ?BNP: ?Invalid input(s): POCBNP ?CBG: ?Recent Labs  ?Lab 11/08/21 ?1720 11/08/21 ?2043 10/22/2021 ?0730 11/11/2021 ?1116  ?GLUCAP 143* 143* 138* 131*  ? ?HbA1C: ?No results for input(s): HGBA1C in the last 72 hours. ?Urine analysis: ?   ?Component Value Date/Time  ? COLORURINE YELLOW 11/08/2021 1156  ? APPEARANCEUR CLEAR 11/08/2021 1156  ? LABSPEC 1.018 11/08/2021 1156  ? PHURINE 6.0 11/08/2021 1156  ? GLUCOSEU NEGATIVE 11/08/2021 1156  ? HGBUR NEGATIVE 11/08/2021 1156  ? [place] NEGATIVE 11/08/2021 1156  ? [place] NEGATIVE 11/08/2021 1156  ? PROTEINUR NEGATIVE 11/08/2021 1156  ? NITRITE NEGATIVE 11/08/2021 1156  ? LEUKOCYTESUR MODERATE (A) 11/08/2021 1156  ? ?Sepsis Labs: ?@[email or web address] (procalcitonin:4,lacticidven:4) ?) ?Recent Results (from the past 240 hour(s))  ?Culture, blood (routine x 2)     Status: None  ? Collection Time: 11/08/21 11:51 AM  ?  Specimen: BLOOD  ?Result Value Ref Range Status  ? Specimen Description BLOOD LEFT ANTECUBITAL  Final  ? Special Requests   Final  ?  BOTTLES DRAWN AEROBIC AND ANAEROBIC Blood Culture adequate volume  ?

## 2021-11-20 NOTE — Death Summary Note (Signed)
? ?DEATH SUMMARY  ? ?Patient Details  ?Name: Sandy [name] ?MRN: [id number] ?DOB: [birth year] ?HGD:[id number], Curt Jews, MD ?Admission/Discharge Information  ? ?Admit Date:  Dec 07, 2021  ?Date of Death: Date of Death: December 13, 2021  ?Time of Death: Time of Death: 68  ?Length of Stay: 6  ? ?Principle Cause of death: END STAGE LIVER DISEASE ? ?Hospital Diagnoses: ?Principal Problem: ?  Comfort measures only status ?Active Problems: ?  Acute hepatic encephalopathy ?  MSSA bacteremia ?  UTI due to Klebsiella species ?  Alcoholic cirrhosis ([place]) ?  DNR (do not resuscitate) ?  Type 2 diabetes mellitus ([place]) ?  History of esophageal varices ?  Goals of care, counseling/discussion ?  History of lung cancer ?  Paroxysmal atrial fibrillation ([facility]) ?  Hypothyroidism ?  Protein-calorie malnutrition, severe ?  Lactic acidosis ? ? ?Hospital Course: ?66 year old female with longstanding history of alcohol abuse, alcoholic liver cirrhosis with esophageal varices, GI bleeding, lung cancer s/p lobectomy, hypertension, hyperlipidemia, type 2 diabetes mellitus, hypothyroidism, COPD, atrial fibrillation.  Patient has had 2 recent hospitalizations in the last month.  The patient was admitted from 10/29/2021 to 11/01/2021 with atrial fibrillation with RVR.  She was started on diltiazem and nadolol at the time of discharge.  She was also discharged home with a steroid taper secondary to her pleuritic chest pain at that time CTA of the chest was negative for PE during that admission.  Once again, she was readmitted from 11/03/2021 to 11/06/2021 for hepatic encephalopathy.  At that time, she was discharged to a skilled nursing facility.  With lactulose.   She was last noted to be normal on 11/07/2021 according to the nursing home staff.  They went in to check on her this morning and found her to be obtunded and sent her to the emergency department.  When she was in the hospital she was receiving lactulose twice daily with 3-4 bowel movements per day.   Her ammonia levels had improved and her mentation had normalized to baseline.  At the time of this admission on 11/08/2021, the patient was noted to have an ammonia level of 214. ?  ?Patient recently had an admission in February for upper GI bleeding with hematemesis.  She had an EGD on 09/24/2021 showing LA grade D esophagitis with no bleeding.  She has portal hypertensive gastropathy, few gastric polyps and a single nonbleeding angioectasia in the duodenum.  She was treated with APC and was placed on PPI for 8 weeks. ?  ?Her ED work-up significant for CT scan brain with no acute findings.  ABG revealed a normal PCO2.  Ammonia level elevated at 214.  Her total bilirubin was 4.4.  Her lactic acid was 3.5.  Her platelets were 178.  She recently tested positive for THC.  Her CXR reveals a moderate right pleural effusion mostly unchanged from 3/15.   ?  ?2021/12/07: full comfort measures. [facility] to come assess for residential hospice bed, if not available will make GIP status. Family to meet with [facility] at bedside.  Transition to GIP per [facility]ice of [facility]. ? ?11/10/21: Patient remains in GIP status waiting for residential hospice bed.  Full comfort measures continues.  Patient is wake and occasionally answers yes/no questions.  No pain or sob. ? ?11/11/21:  Patient remains in GIP status.  Full comfort measures continues.  Patient is awake and occasionally answers yes/no but appears more somnolent today.  No pain or sob endorsed. ? ?11/12/21:  Patient remains in GIP.  No pain. Awake but not answering as much today. ? ?11/13/21: patient remains in GIP.  She is more somnolent today.  Only intermittently opens eyes, no responding to questions.  No respiratory distress or uncontrolled pain. ? ?11/14/21:  remains in GIP.  More somnolent, not responsive to voice.  Not opening eyes.  No respiratory distress or uncontrolled pain. ? ?Assessment and Plan: ?* Comfort measures only status ?Pt [facility] to [facility] for full comfort measures.   ?11/11/21:  Remains stable for transfer to residential hospice;  Expected life expectancy < 2 weeks ? ?Acute hepatic encephalopathy ?Presented with altered mental status with ammonia 214 ?Initially on lactulose ?Transitioned to full comfort measures ? ?MSSA bacteremia ?From end stage liver disease, now transitioning to full comfort measures ?3/20 blood cultures MSSA ?Antibiotics not started as pt was transitioned to full comfort care ? ?UTI due to Klebsiella species ?UA 11-20 WBC ?Antibiotics not started as patient was transitioned to full comfort measures ? ?Alcoholic cirrhosis ([place]) ?Pt is at end stage liver disease and after discussing with GI not a candidate for liver transplant given ongoing alcohol abuse.  She has been transitioned to full comfort measures.   ? ?Lactic acidosis ?Pt is critically ill and transitioned to full comfort measures.  ?Initially had lactate 3.5 ? ?Protein-calorie malnutrition, severe ?Full comfort measures ? ?Hypothyroidism ?Now on full comfort measures ? ?Paroxysmal atrial fibrillation (HCC) ?Previously on diltiazem and nadolol ?No longer active issue as pt transitioned to full comfort measures ? ?History of lung cancer ?Patient is status post right lumpectomy. ?She had a recent CTA chest done at [facility] showing increased consolidation to the right apex with follow-up planned with her oncologist [name].   ?Pt now transitioned to full comfort measures.  ? ?Goals of care, counseling/discussion ?Appreciate palliative consultation and discussions.  Pt was transitioned to full comfort care on 3/21 and [facility] evaluated patient today and recommended GIP status.  ?Patient transitioned to GIP status waiting for residential hospice bed ?Life expectancy < 2 weeks ? ?History of esophageal varices ?From end stage liver disease now on full comfort care ? ?Type 2 diabetes mellitus ([place]) ?Controlled ?Full comfort measures now ? ? ? ? ?  ? ? ?Procedures: NONE ? ?Consultations:  PALLIATIVE ? ?The results of significant diagnostics from this hospitalization (including imaging, microbiology, ancillary and laboratory) are listed below for reference.  ? ?Significant Diagnostic Studies: ?DG Chest 1 View ? ?Result Date: 11/03/2021 ?CLINICAL DATA:  Altered mental status.  Foul-smelling urine. EXAM: CHEST  1 VIEW COMPARISON:  10/30/2021 FINDINGS: Patient is rotated to the right. Circumferential pleural thickening and volume loss in the right hemithorax is similar to prior with soft tissue fullness in the right hilum and right base collapse/consolidative opacity with pleural effusion. Left lung remains clear. The visualized bony structures of the thorax are unremarkable. IMPRESSION: Right base collapse/consolidative opacity with right pleural effusion, similar to prior. Electronically Signed   By: Misty Stanley M.D.   On: 11/03/2021 13:40  ? ?DG Chest 1 View ? ?Result Date: 10/30/2021 ?CLINICAL DATA:  Shortness of breath. EXAM: CHEST  1 VIEW COMPARISON:  Chest radiograph dated 10/29/2021 and CT dated 10/29/2021 FINDINGS: Similar appearance of the right lung with volume loss and architectural distortion and small effusion. The left lung remains clear. No pneumothorax. There is shift of the mediastinum to the right similar to prior radiograph. Stable cardiomediastinal silhouette. No acute osseous pathology. IMPRESSION: No interval change. Electronically Signed   By: Milas Hock  Radparvar M.D.   On: 10/30/2021 21:45  ? ?CT Head Wo Contrast ? ?Result Date: 11/08/2021 ?CLINICAL DATA:  Altered mental status EXAM: CT HEAD WITHOUT CONTRAST TECHNIQUE: Contiguous axial images were obtained from the base of the skull through the vertex without intravenous contrast. RADIATION DOSE REDUCTION: This exam was performed according to the departmental dose-optimization program which includes automated exposure control, adjustment of the mA and/or kV according to patient size and/or use of iterative reconstruction technique.  COMPARISON:  11/03/2021 FINDINGS: Brain: No acute intracranial findings are seen. Ventricles are not dilated. There is no focal mass effect. Cortical sulci are prominent. Vascular: Unremarkable. Skull: No fr

## 2021-11-20 NOTE — Progress Notes (Signed)
Nursing staff entered room at [place] and patient found to have to heart beat or respirations. Confirmed by 2 RN's Deeann Dowse RN and Sylvan Cheese RN. On call MD notified. [facility] called and body placed on hold for possible eye donation. Patient's family was notified and chose [facility]. [facility] called and spoke with Tye [place] to notify of patient's death. ?

## 2021-11-20 DEATH — deceased

## 2021-11-23 ENCOUNTER — Ambulatory Visit: Payer: Medicare Other | Admitting: Thoracic Surgery (Cardiothoracic Vascular Surgery)

## 2021-12-14 ENCOUNTER — Ambulatory Visit (HOSPITAL_COMMUNITY)

## 2021-12-14 ENCOUNTER — Other Ambulatory Visit: Payer: Medicare Other

## 2021-12-16 ENCOUNTER — Ambulatory Visit: Payer: Medicare Other | Admitting: Internal Medicine
